# Patient Record
Sex: Female | Born: 1950 | Race: White | Hispanic: No | Marital: Married | Smoking: Never smoker
Health system: Southern US, Community
[De-identification: ages and names within clinical notes are randomized; demographics above are authoritative.]

## PROBLEM LIST (undated history)

## (undated) DIAGNOSIS — N189 Chronic kidney disease, unspecified: Secondary | ICD-10-CM

## (undated) DIAGNOSIS — I10 Essential (primary) hypertension: Secondary | ICD-10-CM

## (undated) DIAGNOSIS — F419 Anxiety disorder, unspecified: Secondary | ICD-10-CM

## (undated) DIAGNOSIS — M25519 Pain in unspecified shoulder: Secondary | ICD-10-CM

## (undated) HISTORY — PX: DILATION AND CURETTAGE, DIAGNOSTIC / THERAPEUTIC: SUR384

## (undated) HISTORY — PX: COLONOSCOPY: SHX174

---

## 2001-12-15 ENCOUNTER — Emergency Department (HOSPITAL_COMMUNITY): Admission: EM | Admit: 2001-12-15 | Discharge: 2001-12-15 | Payer: Self-pay

## 2003-10-26 ENCOUNTER — Other Ambulatory Visit: Payer: Self-pay

## 2004-12-24 ENCOUNTER — Ambulatory Visit: Payer: Self-pay | Admitting: Family Medicine

## 2005-01-01 ENCOUNTER — Ambulatory Visit: Payer: Self-pay | Admitting: Family Medicine

## 2017-01-04 ENCOUNTER — Ambulatory Visit: Payer: Self-pay

## 2017-02-01 ENCOUNTER — Ambulatory Visit: Payer: Self-pay

## 2017-03-24 ENCOUNTER — Encounter (INDEPENDENT_AMBULATORY_CARE_PROVIDER_SITE_OTHER): Payer: Self-pay

## 2017-03-24 ENCOUNTER — Ambulatory Visit: Payer: BLUE CROSS/BLUE SHIELD | Attending: Oncology

## 2017-03-24 ENCOUNTER — Ambulatory Visit
Admission: RE | Admit: 2017-03-24 | Discharge: 2017-03-24 | Disposition: A | Discharge status: Home / Self Care | Payer: BLUE CROSS/BLUE SHIELD | Source: Ambulatory Visit | Attending: Oncology | Admitting: Oncology

## 2017-03-24 VITALS — BP 130/77 | HR 76 | Temp 98.6°F | Resp 18 | Ht 61.81 in | Wt 106.4 lb

## 2017-03-24 DIAGNOSIS — Z0000 Encounter for general adult medical examination without abnormal findings: Secondary | ICD-10-CM

## 2017-03-24 NOTE — Progress Notes (Signed)
Subjective:     Patient ID: Danielle Stephens, female   DOB: 03-19-1950, 67 y.o.   MRN: [id number]  HPI   Review of Systems     Objective:   Physical Exam  Pulmonary/Chest: Right breast exhibits no inverted nipple, no mass, no nipple discharge, no skin change and no tenderness. Left breast exhibits no inverted nipple, no mass, no nipple discharge and no skin change. Breasts are asymmetrical.  Left breast smaller than right        Assessment:    67 year old patient presents for [facility] clinic visit.  Patient screened, and meets BCCCP eligibility.  Patient does not have insurance, Medicare or Medicaid.  Handout given on Affordable Care Act.  Instructed patient on breast self-exam using teach back method.  CBE unremarkable.  No mass or lump palpated.  Patient states her last mammogram is 14 years ago. 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    Plan:     Sent for bilateral screening mammogram.

## 2017-03-25 NOTE — Progress Notes (Signed)
Letter mailed from [facility] to notify of normal mammogram results.  Patient to return in one year for annual screening.  Copy to [facility]. 

## 2017-04-17 ENCOUNTER — Encounter: Payer: Self-pay | Admitting: Gynecology

## 2017-04-17 ENCOUNTER — Ambulatory Visit
Admission: EM | Admit: 2017-04-17 | Discharge: 2017-04-17 | Disposition: A | Discharge status: Home / Self Care | Payer: BLUE CROSS/BLUE SHIELD | Attending: Family Medicine | Admitting: Family Medicine

## 2017-04-17 ENCOUNTER — Other Ambulatory Visit: Payer: Self-pay

## 2017-04-17 DIAGNOSIS — H10021 Other mucopurulent conjunctivitis, right eye: Secondary | ICD-10-CM

## 2017-04-17 DIAGNOSIS — H1031 Unspecified acute conjunctivitis, right eye: Secondary | ICD-10-CM

## 2017-04-17 HISTORY — DX: Essential (primary) hypertension: I10

## 2017-04-17 MED ORDER — POLYMYXIN B-TRIMETHOPRIM 10000-0.1 UNIT/ML-% OP SOLN: 1.0000 [drp] | Freq: Four times a day (QID) | OPHTHALMIC | 0 refills | Status: AC

## 2017-04-17 NOTE — ED Provider Notes (Signed)
[facility]  CSN: [id number] Arrival date & time: 04/17/17  0175  History   Chief Complaint Chief Complaint  Patient presents with  . Conjunctivitis   HPI  67 year old female presents with conjunctivitis.  Patient works at a daycare.  Started yesterday.  She has had right eye redness and draining.  Crusted shut this morning.  No photophobia.  No visual disturbance.  No reports of foreign body.  No known exacerbating or relieving factors.  No other associated symptoms.  No other complaints at this time.  Past Medical History:  Diagnosis Date  . Hypertension    Surgical Hx - No past surgeries.  OB History    No data available     Home Medications    Prior to Admission medications   Medication Sig Start Date End Date Taking? Authorizing Provider  lisinopril (PRINIVIL,ZESTRIL) 20 MG tablet Take 20 mg by mouth daily.   Yes [provider]  trimethoprim-polymyxin b (POLYTRIM) ophthalmic solution Place 1 drop into the right eye every 6 (six) hours for 5 days. 04/17/17 04/22/17  Coral Spikes, DO   Family History Family History  Problem Relation Age of Onset  . Breast cancer Neg Hx     Social History Social History   Tobacco Use  . Smoking status: Never Smoker  . Smokeless tobacco: Never Used  Substance Use Topics  . Alcohol use: No    Frequency: Never  . Drug use: No    Allergies   Codeine   Review of Systems Review of Systems  Constitutional: Negative.   Eyes: Positive for discharge and redness.   Physical Exam Triage Vital Signs ED Triage Vitals  Enc Vitals Group     BP 04/17/17 1019 (!) 151/66     Pulse Rate 04/17/17 1019 94     Resp 04/17/17 1019 16     Temp 04/17/17 1019 98.8 F (37.1 C)     Temp Source 04/17/17 1019 Oral     SpO2 04/17/17 1019 100 %     Weight 04/17/17 1025 106 lb (48.1 kg)     Height 04/17/17 1025 5\' 2"  (1.575 m)     Head Circumference --      Peak Flow --      Pain Score 04/17/17 1022 0     Pain Loc --    Pain Edu? --      Excl. in [place]? --    Updated Vital Signs BP (!) 151/66 (BP Location: Left Arm)   Pulse 94   Temp 98.8 F (37.1 C) (Oral)   Resp 16   Ht 5\' 2"  (1.575 m)   Wt 106 lb (48.1 kg)   SpO2 100%   BMI 19.39 kg/m   Visual Acuity Right Eye Distance: 20/30 Left Eye Distance: 20/40 Bilateral Distance: 20/30(without corrective lens)  Right Eye Near:   Left Eye Near:    Bilateral Near:     Physical Exam  Constitutional: She is oriented to person, place, and time. She appears well-developed and well-nourished. No distress.  Eyes: Pupils are equal, round, and reactive to light.  Right conjunctival injection.  No drainage or crusting noted.  Cardiovascular: Normal rate and regular rhythm.  Pulmonary/Chest: Effort normal. She has no wheezes. She has no rales.  Neurological: She is alert and oriented to person, place, and time.  Psychiatric: She has a normal mood and affect. Her behavior is normal.  Nursing note and vitals reviewed.  UC Treatments / Results  Labs (all labs ordered  are listed, but only abnormal results are displayed) Labs Reviewed - No data to display  EKG  EKG Interpretation None       Radiology No results found.  Procedures Procedures (including critical care time)  Medications Ordered in UC Medications - No data to display   Initial Impression / Assessment and Plan / UC Course  I have reviewed the triage vital signs and the nursing notes.  Pertinent labs & imaging results that were available during my care of the patient were reviewed by me and considered in my medical decision making (see chart for details).     67 year old female presents with conjunctivitis.  Treating with Polytrim.  Final Clinical Impressions(s) / UC Diagnoses   Final diagnoses:  Acute bacterial conjunctivitis of right eye    ED Discharge Orders        Ordered    trimethoprim-polymyxin b (POLYTRIM) ophthalmic solution  Every 6 hours     04/17/17 1039      Controlled Substance Prescriptions [place] Controlled Substance Registry consulted? Not Applicable   Coral Spikes, DO 04/17/17 1050

## 2017-04-17 NOTE — ED Triage Notes (Signed)
Patient c/o right eye redness/ drainage x yesterday.

## 2017-04-20 ENCOUNTER — Telehealth: Payer: Self-pay | Admitting: Emergency Medicine

## 2017-04-20 NOTE — Telephone Encounter (Signed)
Called to follow up after patient's recent visit. Spoke with family member and he states patient is doing better. He just dropped her off at [employer] and she is on her way to [place].

## 2019-01-20 ENCOUNTER — Ambulatory Visit
Admission: EM | Admit: 2019-01-20 | Discharge: 2019-01-20 | Disposition: A | Discharge status: Home / Self Care | Payer: Medicare HMO | Attending: Family Medicine | Admitting: Family Medicine

## 2019-01-20 ENCOUNTER — Other Ambulatory Visit: Payer: Self-pay

## 2019-01-20 ENCOUNTER — Ambulatory Visit (INDEPENDENT_AMBULATORY_CARE_PROVIDER_SITE_OTHER): Payer: Medicare HMO

## 2019-01-20 DIAGNOSIS — M25511 Pain in right shoulder: Secondary | ICD-10-CM

## 2019-01-20 MED ORDER — MELOXICAM 7.5 MG PO TABS: 7.5000 mg | ORAL_TABLET | Freq: Every day | ORAL | 0 refills | Status: DC

## 2019-01-20 MED ORDER — TRAMADOL HCL 50 MG PO TABS: 50.0000 mg | ORAL_TABLET | Freq: Two times a day (BID) | ORAL | 0 refills | Status: DC | PRN

## 2019-01-20 NOTE — Discharge Instructions (Addendum)
Take medication as prescribed. Rest. Drink plenty of fluids. Exercises as discussed. Ice.   Follow-up with orthopedic in 1 week as needed for continued pain.  Follow up with your primary care physician this week as needed. Return to Urgent care for new or worsening concerns.

## 2019-01-20 NOTE — ED Triage Notes (Signed)
Patient complains of right shoulder pain that occurred 2 weeks ago after trying to catch a board that she was lifting. Patient states that pain has remained constant.

## 2019-01-20 NOTE — ED Provider Notes (Addendum)
[facility] ____________________________________________  Time seen: Approximately 12:07 PM  I have reviewed the triage vital signs and the nursing notes.   HISTORY  Chief Complaint Shoulder Pain  HPI Danielle Stephens is a 68 y.o. female presenting for evaluation of right shoulder pain.  Right-hand-dominant.  Patient reports 2 weeks ago she and her husband were carrying a piece of drywall, in which she accidentally dropped it but then caught it again.  Reports when she did this she immediately felt a pull and pain to her right shoulder.  Reports right shoulder pain has continued.  Denies pain radiation, paresthesias, chest pain or shortness of breath.  Denies skin changes or rash.  Reports pain is mostly with active range of motion or when laying on the area at night.  Has been taken ibuprofen and Tylenol intermittently with some improvement but no resolution. Also applying ice.   Gennette Pac, FNP: PCP   Past Medical History:  Diagnosis Date  . Hypertension     There are no active problems to display for this patient.   Past Surgical History:  Procedure Laterality Date  . NO PAST SURGERIES       No current facility-administered medications for this encounter.   Current Outpatient Medications:  .  lisinopril (PRINIVIL,ZESTRIL) 20 MG tablet, Take 20 mg by mouth daily., Disp: , Rfl:  .  meloxicam (MOBIC) 7.5 MG tablet, Take 1 tablet (7.5 mg total) by mouth daily., Disp: 14 tablet, Rfl: 0 .  traMADol (ULTRAM) 50 MG tablet, Take 1 tablet (50 mg total) by mouth every 12 (twelve) hours as needed for severe pain ((0.5-1tab as needed))., Disp: 10 tablet, Rfl: 0  Allergies Codeine  Family History  Problem Relation Age of Onset  . Hypertension Mother   . Cancer Mother   . Hypertension Father   . Heart attack Father   . Breast cancer Neg Hx     Social History Social History   Tobacco Use  . Smoking status: Never Smoker  . Smokeless tobacco: Never Used   Substance Use Topics  . Alcohol use: No    Frequency: Never  . Drug use: No    Review of Systems Constitutional: No fever. Cardiovascular: Denies chest pain. Respiratory: Denies shortness of breath. Gastrointestinal: No abdominal pain.   Musculoskeletal: Positive right shoulder pain. Skin: Negative for rash. Neurological: Negative for focal weakness or numbness.   ____________________________________________   PHYSICAL EXAM:  VITAL SIGNS: ED Triage Vitals  Enc Vitals Group     BP 01/20/19 1047 (!) 173/67     Pulse Rate 01/20/19 1047 71     Resp 01/20/19 1047 16     Temp 01/20/19 1047 98.2 F (36.8 C)     Temp Source 01/20/19 1047 Oral     SpO2 01/20/19 1047 100 %     Weight 01/20/19 1045 106 lb (48.1 kg)     Height 01/20/19 1045 5\' 2"  (1.575 m)     Head Circumference --      Peak Flow --      Pain Score 01/20/19 1045 8     Pain Loc --      Pain Edu? --      Excl. in [place]? --    Vitals:   01/20/19 1045 01/20/19 1047 01/20/19 1139  BP:  (!) 173/67 (!) 150/80  Pulse:  71   Resp:  16   Temp:  98.2 F (36.8 C)   TempSrc:  Oral   SpO2:  100%  Weight: 106 lb (48.1 kg)    Height: 5\' 2"  (1.575 m)     Constitutional: Alert and oriented. Well appearing and in no acute distress. Eyes: Conjunctivae are normal.  ENT      Head: Normocephalic and atraumatic. Cardiovascular: Normal rate, regular rhythm. Grossly normal heart sounds.  Good peripheral circulation. Respiratory: Normal respiratory effort without tachypnea nor retractions. Breath sounds are clear and equal bilaterally. No wheezes, rales, rhonchi. Musculoskeletal:No midline cervical, thoracic or lumbar tenderness to palpation. Bilateral distal radial pulses equal and easily palpated. Except:  Right anterior dorsal shoulder mild tenderness to direct palpation mild tenderness along supraspinatus, no point bony tenderness, able to abduct to approximately 90 degrees but with increasing pain past 90 degrees, negative  drop arm test, positive empty can test, pain with internal and external rotation, right upper extremity otherwise nontender. Neurologic:  Normal speech and language. No gross focal neurologic deficits are appreciated. Speech is normal. No gait instability.  Skin:  Skin is warm, dry and intact. No rash noted. Psychiatric: Mood and affect are normal. Speech and behavior are normal. Patient exhibits appropriate insight and judgment   ___________________________________________   LABS (all labs ordered are listed, but only abnormal results are displayed)  Labs Reviewed - No data to display ____________________________________________  RADIOLOGY  Dg Shoulder Right  Result Date: 01/20/2019 CLINICAL DATA:  Right shoulder injury with pain radiating to the right arm, initial encounter. EXAM: RIGHT SHOULDER - 2+ VIEW COMPARISON:  None. FINDINGS: No acute osseous or joint abnormality. Degenerative changes in the right acromioclavicular joint. Visualized portion of the right chest is grossly unremarkable. IMPRESSION: 1. No acute findings. 2. Mild right acromioclavicular joint osteoarthritis. Electronically Signed   By: Lorin Picket M.D.   On: 01/20/2019 11:41   ____________________________________________   PROCEDURES Procedures    INITIAL IMPRESSION / ASSESSMENT AND PLAN / ED COURSE  Pertinent labs & imaging results that were available during my care of the patient were reviewed by me and considered in my medical decision making (see chart for details).  Well-appearing patient.  No acute distress.  Right shoulder pain post injury.  Right shoulder x-ray as above, no acute findings, mild right AC joint osteoarthritis.  Suspect strain versus partial rotator cuff tear.  Will start patient on oral Mobic and as needed tramadol as needed for breakthrough pain.  Recommend ice, pendulum exercises and follow-up with orthopedic in 1 week for continued pain is possible MRI and physical therapy may be  needed.Discussed indication, risks and benefits of medications with patient.   Discussed follow up with Primary care physician this week as needed. Discussed follow up and return parameters including no resolution or any worsening concerns. Patient verbalized understanding and agreed to plan.   [place] controlled substance database reviewed, no recent controlled substances documented.  ____________________________________________   FINAL CLINICAL IMPRESSION(S) / ED DIAGNOSES  Final diagnoses:  Acute pain of right shoulder     ED Discharge Orders         Ordered    meloxicam (MOBIC) 7.5 MG tablet  Daily     01/20/19 1156    traMADol (ULTRAM) 50 MG tablet  Every 12 hours PRN     01/20/19 1156           Note: This dictation was prepared with Dragon dictation along with smaller phrase technology. Any transcriptional errors that result from this process are unintentional.      Marylene Land, NP 01/20/19 1256

## 2019-02-02 ENCOUNTER — Other Ambulatory Visit: Payer: Self-pay | Admitting: Orthopedic Surgery

## 2019-02-02 DIAGNOSIS — M25311 Other instability, right shoulder: Secondary | ICD-10-CM

## 2019-02-02 DIAGNOSIS — S46001A Unspecified injury of muscle(s) and tendon(s) of the rotator cuff of right shoulder, initial encounter: Secondary | ICD-10-CM

## 2019-02-17 ENCOUNTER — Ambulatory Visit
Admission: RE | Admit: 2019-02-17 | Discharge: 2019-02-17 | Disposition: A | Discharge status: Home / Self Care | Payer: Medicare HMO | Source: Ambulatory Visit | Attending: Orthopedic Surgery | Admitting: Orthopedic Surgery

## 2019-02-17 ENCOUNTER — Other Ambulatory Visit: Payer: Self-pay

## 2019-02-17 DIAGNOSIS — S46001A Unspecified injury of muscle(s) and tendon(s) of the rotator cuff of right shoulder, initial encounter: Secondary | ICD-10-CM | POA: Insufficient documentation

## 2019-02-17 DIAGNOSIS — M25311 Other instability, right shoulder: Secondary | ICD-10-CM | POA: Diagnosis present

## 2019-03-01 ENCOUNTER — Other Ambulatory Visit: Payer: Self-pay

## 2019-03-01 ENCOUNTER — Other Ambulatory Visit: Payer: Self-pay | Admitting: Orthopedic Surgery

## 2019-03-01 ENCOUNTER — Encounter
Admission: RE | Admit: 2019-03-01 | Discharge: 2019-03-01 | Disposition: A | Discharge status: Home / Self Care | Payer: Medicare HMO | Source: Ambulatory Visit | Attending: Orthopedic Surgery | Admitting: Orthopedic Surgery

## 2019-03-01 DIAGNOSIS — M75101 Unspecified rotator cuff tear or rupture of right shoulder, not specified as traumatic: Secondary | ICD-10-CM | POA: Insufficient documentation

## 2019-03-01 DIAGNOSIS — Z20828 Contact with and (suspected) exposure to other viral communicable diseases: Secondary | ICD-10-CM | POA: Insufficient documentation

## 2019-03-01 DIAGNOSIS — Z01818 Encounter for other preprocedural examination: Secondary | ICD-10-CM | POA: Insufficient documentation

## 2019-03-01 DIAGNOSIS — I10 Essential (primary) hypertension: Secondary | ICD-10-CM | POA: Insufficient documentation

## 2019-03-01 HISTORY — DX: Anxiety disorder, unspecified: F41.9

## 2019-03-01 HISTORY — DX: Pain in unspecified shoulder: M25.519

## 2019-03-01 NOTE — Pre-Procedure Instructions (Signed)
No surgical orders called office.

## 2019-03-01 NOTE — Patient Instructions (Addendum)
Your procedure is scheduled on: 03/06/2019 Mon Report to Same Day Surgery 2nd floor medical mall [facility] Entrance-take elevator on left to 2nd floor.  Check in with surgery information desk.) To find out your arrival time please call [phone] between 1PM - 3PM on 03/03/2019 Fri  Remember: Instructions that are not followed completely may result in serious medical risk, up to and including death, or upon the discretion of your surgeon and anesthesiologist your surgery may need to be rescheduled.    _x___ 1. Do not eat food after midnight the night before your procedure. You may drink clear liquids up to 2 hours before you are scheduled to arrive at the hospital for your procedure.  Do not drink clear liquids within 2 hours of your scheduled arrival to the hospital.  Clear liquids include  --Water or Apple juice without pulp  --Clear carbohydrate beverage such as ClearFast or Gatorade  --Black Coffee or Clear Tea (No milk, no creamers, do not add anything to                  the coffee or Tea Type 1 and type 2 diabetics should only drink water.   ____Ensure clear carbohydrate drink on the way to the hospital for bariatric patients  __x__Ensure clear carbohydrate drink 3 hours before surgery. Complete drink 1.5 hours prior to surgery.   No gum chewing or hard candies.     __x__ 2. No Alcohol for 24 hours before or after surgery.   __x__3. No Smoking or e-cigarettes for 24 prior to surgery.  Do not use any chewable tobacco products for at least 6 hour prior to surgery   ____  4. Bring all medications with you on the day of surgery if instructed.    __x__ 5. Notify your doctor if there is any change in your medical condition     (cold, fever, infections).    x___6. On the morning of surgery brush your teeth with toothpaste and water.  You may rinse your mouth with mouth wash if you wish.  Do not swallow any toothpaste or mouthwash.   Do not wear jewelry, make-up, hairpins,  clips or nail polish.  Do not wear lotions, powders, or perfumes. You may wear deodorant.  Do not shave 48 hours prior to surgery. Men may shave face and neck.  Do not bring valuables to the hospital.    [facility] is not responsible for any belongings or valuables.               Contacts, dentures or bridgework may not be worn into surgery.  Leave your suitcase in the car. After surgery it may be brought to your room.  For patients admitted to the hospital, discharge time is determined by your                       treatment team.  _  Patients discharged the day of surgery will not be allowed to drive home.  You will need someone to drive you home and stay with you the night of your procedure.    Please read over the following fact sheets that you were given:   [facility] Preparing for Surgery and or MRSA Information   _x___ Take anti-hypertensive listed below, cardiac, seizure, asthma,     anti-reflux and psychiatric medicines. These include:  1. None  2.  3.  4.  5.  6.  ____Fleets enema or Magnesium Citrate as directed.  _x___ Use CHG Soap or sage wipes as directed on instruction sheet   ____ Use inhalers on the day of surgery and bring to hospital day of surgery  ____ Stop Metformin and Janumet 2 days prior to surgery.    ____ Take 1/2 of usual insulin dose the night before surgery and none on the morning     surgery.   _x___ Follow recommendations from Cardiologist, Pulmonologist or PCP regarding          stopping Aspirin, Coumadin, Plavix ,Eliquis, Effient, or Pradaxa, and Pletal.  X____Stop Anti-inflammatories such as Advil, Aleve, Ibuprofen, Motrin, Naproxen, Naprosyn, Goodies powders or aspirin products. OK to take Tylenol and                          Celebrex.   _x___ Stop supplements until after surgery.  But may continue Vitamin D, Vitamin B,       and multivitamin.   ____ Bring C-Pap to the hospital.

## 2019-03-02 ENCOUNTER — Encounter
Admission: RE | Admit: 2019-03-02 | Discharge: 2019-03-02 | Disposition: A | Discharge status: Home / Self Care | Payer: Medicare HMO | Source: Ambulatory Visit | Attending: Orthopedic Surgery | Admitting: Orthopedic Surgery

## 2019-03-02 ENCOUNTER — Other Ambulatory Visit
Admission: RE | Admit: 2019-03-02 | Discharge: 2019-03-02 | Disposition: A | Discharge status: Home / Self Care | Payer: Medicare HMO | Source: Ambulatory Visit | Attending: Orthopedic Surgery | Admitting: Orthopedic Surgery

## 2019-03-02 ENCOUNTER — Other Ambulatory Visit: Payer: Self-pay

## 2019-03-02 DIAGNOSIS — Z20828 Contact with and (suspected) exposure to other viral communicable diseases: Secondary | ICD-10-CM | POA: Diagnosis not present

## 2019-03-02 DIAGNOSIS — Z01818 Encounter for other preprocedural examination: Secondary | ICD-10-CM | POA: Diagnosis not present

## 2019-03-02 DIAGNOSIS — M75101 Unspecified rotator cuff tear or rupture of right shoulder, not specified as traumatic: Secondary | ICD-10-CM | POA: Diagnosis not present

## 2019-03-02 DIAGNOSIS — I10 Essential (primary) hypertension: Secondary | ICD-10-CM | POA: Diagnosis not present

## 2019-03-02 LAB — BASIC METABOLIC PANEL
Anion gap: 9 (ref 5–15)
BUN: 17 mg/dL (ref 8–23)
CO2: 28 mmol/L (ref 22–32)
Calcium: 9.6 mg/dL (ref 8.9–10.3)
Chloride: 103 mmol/L (ref 98–111)
Creatinine, Ser: 0.91 mg/dL (ref 0.44–1.00)
GFR calc Af Amer: >60 mL/min (ref >60)
GFR calc non Af Amer: >60 mL/min (ref >60)
Glucose, Bld: 74 mg/dL (ref 70–99)
Potassium: 4.4 mmol/L (ref 3.5–5.1)
Sodium: 140 mmol/L (ref 135–145)

## 2019-03-02 LAB — CBC
HCT: 39.7 % (ref 36.0–46.0)
Hemoglobin: 12.7 g/dL (ref 12.0–15.0)
MCH: 26.6 pg (ref 26.0–34.0)
MCHC: 32 g/dL (ref 30.0–36.0)
MCV: 83.2 fL (ref 80.0–100.0)
Platelets: 229 10*3/uL (ref 150–400)
RBC: 4.77 MIL/uL (ref 3.87–5.11)
RDW: 12.9 % (ref 11.5–15.5)
WBC: 4.7 10*3/uL (ref 4.0–10.5)
nRBC: 0 % (ref 0.0–0.2)

## 2019-03-02 LAB — SARS CORONAVIRUS 2 (TAT 6-24 HRS): SARS Coronavirus 2: NEGATIVE

## 2019-03-05 MED ORDER — CEFAZOLIN SODIUM-DEXTROSE 2-4 GM/100ML-% IV SOLN
2.0000 g | INTRAVENOUS | Status: AC
  Administered 2019-03-06: 2 g via INTRAVENOUS

## 2019-03-06 ENCOUNTER — Ambulatory Visit
Admission: RE | Admit: 2019-03-06 | Discharge: 2019-03-06 | Disposition: A | Discharge status: Home / Self Care | Payer: Medicare HMO | Attending: Orthopedic Surgery | Admitting: Orthopedic Surgery

## 2019-03-06 ENCOUNTER — Ambulatory Visit: Payer: Medicare HMO | Admitting: Certified Registered"

## 2019-03-06 ENCOUNTER — Ambulatory Visit: Payer: Medicare HMO

## 2019-03-06 ENCOUNTER — Encounter
Admission: RE | Disposition: A | Discharge status: Home / Self Care | Payer: Self-pay | Source: Home / Self Care | Attending: Orthopedic Surgery

## 2019-03-06 ENCOUNTER — Other Ambulatory Visit: Payer: Self-pay

## 2019-03-06 ENCOUNTER — Encounter: Payer: Self-pay | Admitting: Orthopedic Surgery

## 2019-03-06 DIAGNOSIS — F419 Anxiety disorder, unspecified: Secondary | ICD-10-CM | POA: Diagnosis not present

## 2019-03-06 DIAGNOSIS — M7541 Impingement syndrome of right shoulder: Secondary | ICD-10-CM | POA: Diagnosis not present

## 2019-03-06 DIAGNOSIS — M75101 Unspecified rotator cuff tear or rupture of right shoulder, not specified as traumatic: Secondary | ICD-10-CM | POA: Insufficient documentation

## 2019-03-06 DIAGNOSIS — M19011 Primary osteoarthritis, right shoulder: Secondary | ICD-10-CM | POA: Diagnosis not present

## 2019-03-06 DIAGNOSIS — I10 Essential (primary) hypertension: Secondary | ICD-10-CM | POA: Insufficient documentation

## 2019-03-06 DIAGNOSIS — M7581 Other shoulder lesions, right shoulder: Secondary | ICD-10-CM | POA: Diagnosis not present

## 2019-03-06 DIAGNOSIS — Z419 Encounter for procedure for purposes other than remedying health state, unspecified: Secondary | ICD-10-CM

## 2019-03-06 HISTORY — PX: OTHER SURGICAL HISTORY: SHX169

## 2019-03-06 SURGERY — SHOULDER ARTHROSCOPY WITH SUBACROMIAL DECOMPRESSION AND DISTAL CLAVICLE EXCISION
Anesthesia: General | Site: Shoulder | Laterality: Right

## 2019-03-06 MED ORDER — PHENYLEPHRINE HCL (PRESSORS) 10 MG/ML IV SOLN
INTRAVENOUS | Status: DC | PRN
  Administered 2019-03-06 (×5): 100 ug via INTRAVENOUS

## 2019-03-06 MED ORDER — SODIUM CHLORIDE 0.9 % IV SOLN: INTRAVENOUS | Status: DC | PRN

## 2019-03-06 MED ORDER — OXYCODONE HCL 5 MG PO TABS: 5.0000 mg | ORAL_TABLET | ORAL | 0 refills | Status: DC | PRN

## 2019-03-06 MED ORDER — CEFAZOLIN SODIUM-DEXTROSE 2-4 GM/100ML-% IV SOLN
INTRAVENOUS | Status: AC
  Filled 2019-03-06: qty 100

## 2019-03-06 MED ORDER — LIDOCAINE HCL (PF) 2 % IJ SOLN
INTRAMUSCULAR | Status: AC
  Filled 2019-03-06: qty 10

## 2019-03-06 MED ORDER — ASPIRIN EC 325 MG PO TBEC: 325.0000 mg | DELAYED_RELEASE_TABLET | Freq: Every day | ORAL | 0 refills | Status: AC

## 2019-03-06 MED ORDER — NEOMYCIN-POLYMYXIN B GU 40-200000 IR SOLN
Status: AC
  Filled 2019-03-06: qty 20

## 2019-03-06 MED ORDER — FENTANYL CITRATE (PF) 100 MCG/2ML IJ SOLN
INTRAMUSCULAR | Status: DC | PRN
  Administered 2019-03-06: 25 ug via INTRAVENOUS
  Administered 2019-03-06: 50 ug via INTRAVENOUS
  Administered 2019-03-06: 25 ug via INTRAVENOUS

## 2019-03-06 MED ORDER — EPHEDRINE SULFATE 50 MG/ML IJ SOLN
INTRAMUSCULAR | Status: AC
  Filled 2019-03-06: qty 1

## 2019-03-06 MED ORDER — ONDANSETRON 4 MG PO TBDP: 4.0000 mg | ORAL_TABLET | Freq: Three times a day (TID) | ORAL | 0 refills | Status: DC | PRN

## 2019-03-06 MED ORDER — FAMOTIDINE 20 MG PO TABS: 20.0000 mg | ORAL_TABLET | Freq: Once | ORAL | Status: AC

## 2019-03-06 MED ORDER — EPINEPHRINE PF 1 MG/ML IJ SOLN
INTRAMUSCULAR | Status: AC
  Filled 2019-03-06: qty 5

## 2019-03-06 MED ORDER — BUPIVACAINE HCL (PF) 0.5 % IJ SOLN
INTRAMUSCULAR | Status: AC
  Filled 2019-03-06: qty 30

## 2019-03-06 MED ORDER — PROMETHAZINE HCL 25 MG/ML IJ SOLN: 6.2500 mg | INTRAMUSCULAR | Status: DC | PRN

## 2019-03-06 MED ORDER — SODIUM CHLORIDE 0.9 % IV SOLN
INTRAVENOUS | Status: DC | PRN
  Administered 2019-03-06: 30 ug/min via INTRAVENOUS

## 2019-03-06 MED ORDER — ACETAMINOPHEN 500 MG PO TABS: 1000.0000 mg | ORAL_TABLET | Freq: Three times a day (TID) | ORAL | 2 refills | Status: DC

## 2019-03-06 MED ORDER — FENTANYL CITRATE (PF) 100 MCG/2ML IJ SOLN
INTRAMUSCULAR | Status: AC
  Filled 2019-03-06: qty 2

## 2019-03-06 MED ORDER — ONDANSETRON HCL 4 MG/2ML IJ SOLN
INTRAMUSCULAR | Status: AC
  Filled 2019-03-06: qty 2

## 2019-03-06 MED ORDER — PROPOFOL 10 MG/ML IV BOLUS
INTRAVENOUS | Status: DC | PRN
  Administered 2019-03-06: 80 mg via INTRAVENOUS

## 2019-03-06 MED ORDER — LACTATED RINGERS IV SOLN: INTRAVENOUS | Status: DC | PRN

## 2019-03-06 MED ORDER — MIDAZOLAM HCL 2 MG/2ML IJ SOLN
INTRAMUSCULAR | Status: DC | PRN
  Administered 2019-03-06 (×2): 1 mg via INTRAVENOUS

## 2019-03-06 MED ORDER — DEXAMETHASONE SODIUM PHOSPHATE 10 MG/ML IJ SOLN
INTRAMUSCULAR | Status: DC | PRN
  Administered 2019-03-06: 4 mg via INTRAVENOUS

## 2019-03-06 MED ORDER — ACETAMINOPHEN 10 MG/ML IV SOLN
INTRAVENOUS | Status: DC | PRN
  Administered 2019-03-06: 1000 mg via INTRAVENOUS

## 2019-03-06 MED ORDER — ROCURONIUM BROMIDE 100 MG/10ML IV SOLN
INTRAVENOUS | Status: DC | PRN
  Administered 2019-03-06: 50 mg via INTRAVENOUS

## 2019-03-06 MED ORDER — ROCURONIUM BROMIDE 50 MG/5ML IV SOLN
INTRAVENOUS | Status: AC
  Filled 2019-03-06: qty 1

## 2019-03-06 MED ORDER — DEXAMETHASONE SODIUM PHOSPHATE 10 MG/ML IJ SOLN
INTRAMUSCULAR | Status: AC
  Filled 2019-03-06: qty 1

## 2019-03-06 MED ORDER — PROPOFOL 10 MG/ML IV BOLUS
INTRAVENOUS | Status: AC
  Filled 2019-03-06: qty 20

## 2019-03-06 MED ORDER — FENTANYL CITRATE (PF) 100 MCG/2ML IJ SOLN
25.0000 ug | INTRAMUSCULAR | Status: DC | PRN
  Administered 2019-03-06 (×3): 25 ug via INTRAVENOUS

## 2019-03-06 MED ORDER — SUGAMMADEX SODIUM 200 MG/2ML IV SOLN
INTRAVENOUS | Status: AC
  Filled 2019-03-06: qty 2

## 2019-03-06 MED ORDER — MIDAZOLAM HCL 2 MG/2ML IJ SOLN
INTRAMUSCULAR | Status: AC
  Filled 2019-03-06: qty 2

## 2019-03-06 MED ORDER — SODIUM CHLORIDE (PF) 0.9 % IJ SOLN
INTRAMUSCULAR | Status: AC
  Filled 2019-03-06: qty 10

## 2019-03-06 MED ORDER — FAMOTIDINE 20 MG PO TABS
ORAL_TABLET | ORAL | Status: AC
  Administered 2019-03-06: 06:00:00 20 mg via ORAL
  Filled 2019-03-06: qty 1

## 2019-03-06 MED ORDER — CHLORHEXIDINE GLUCONATE 4 % EX LIQD: 60.0000 mL | Freq: Once | CUTANEOUS | Status: DC

## 2019-03-06 MED ORDER — ONDANSETRON HCL 4 MG/2ML IJ SOLN
INTRAMUSCULAR | Status: DC | PRN
  Administered 2019-03-06: 4 mg via INTRAVENOUS

## 2019-03-06 MED ORDER — LIDOCAINE HCL (PF) 1 % IJ SOLN
INTRAMUSCULAR | Status: AC
  Filled 2019-03-06: qty 5

## 2019-03-06 MED ORDER — SUGAMMADEX SODIUM 500 MG/5ML IV SOLN
INTRAVENOUS | Status: AC
  Filled 2019-03-06: qty 5

## 2019-03-06 MED ORDER — BUPIVACAINE LIPOSOME 1.3 % IJ SUSP
INTRAMUSCULAR | Status: AC
  Filled 2019-03-06: qty 20

## 2019-03-06 MED ORDER — BUPIVACAINE HCL (PF) 0.5 % IJ SOLN
INTRAMUSCULAR | Status: AC
  Filled 2019-03-06: qty 10

## 2019-03-06 MED ORDER — LIDOCAINE HCL (CARDIAC) PF 100 MG/5ML IV SOSY
PREFILLED_SYRINGE | INTRAVENOUS | Status: DC | PRN
  Administered 2019-03-06: 60 mg via INTRAVENOUS

## 2019-03-06 MED ORDER — FENTANYL CITRATE (PF) 100 MCG/2ML IJ SOLN
INTRAMUSCULAR | Status: AC
  Administered 2019-03-06: 25 ug via INTRAVENOUS
  Filled 2019-03-06: qty 2

## 2019-03-06 MED ORDER — NEOMYCIN-POLYMYXIN B GU 40-200000 IR SOLN
Status: DC | PRN
  Administered 2019-03-06: 2 mL

## 2019-03-06 MED ORDER — PHENYLEPHRINE HCL (PRESSORS) 10 MG/ML IV SOLN
INTRAVENOUS | Status: AC
  Filled 2019-03-06: qty 1

## 2019-03-06 MED ORDER — LIDOCAINE HCL 4 % MT SOLN
OROMUCOSAL | Status: DC | PRN
  Administered 2019-03-06: 4 mL via TOPICAL

## 2019-03-06 MED ORDER — SUGAMMADEX SODIUM 200 MG/2ML IV SOLN
INTRAVENOUS | Status: DC | PRN
  Administered 2019-03-06: 100 mg via INTRAVENOUS

## 2019-03-06 MED ORDER — LACTATED RINGERS IV SOLN: INTRAVENOUS | Status: DC

## 2019-03-06 SURGICAL SUPPLY — 94 items
ADAPTER IRRIG TUBE 2 SPIKE SOL (ADAPTER) ×6 IMPLANT
ADPR TBG 2 SPK PMP STRL ASCP (ADAPTER) ×2
ANCH SUT 2X2.3 TAPE (Anchor) ×2 IMPLANT
ANCH SUT 4.75 1 ARM KNTLS (Anchor) ×1 IMPLANT
ANCH SUT 6.5 KNTLS STRL LF (Anchor) ×3 IMPLANT
ANCHOR 2.3 SP SGL 1.2 XBRAID (Anchor) ×2 IMPLANT
ANCHOR 2.3MM SP SGL 1.2 XBRAID (Anchor) ×2 IMPLANT
ANCHOR SUT 6.5 PEEK EYELET (Anchor) ×6 IMPLANT
APL PRP STRL LF DISP 70% ISPRP (MISCELLANEOUS) ×1
BRUSH SCRUB EZ  4% CHG (MISCELLANEOUS) ×2
BRUSH SCRUB EZ 4% CHG (MISCELLANEOUS) ×1 IMPLANT
BUR BR 5.5 12 FLUTE (BURR) IMPLANT
BUR RADIUS 4.0X18.5 (BURR) ×3 IMPLANT
BUR RADIUS 5.5 (BURR) ×1 IMPLANT
CANNULA 5.75X7CM (CANNULA) ×2
CANNULA PART THRD DISP 5.75X7 (CANNULA) ×4 IMPLANT
CANNULA PARTIAL THREAD 2X7 (CANNULA) ×3 IMPLANT
CANNULA TWIST IN 8.25X9CM (CANNULA) IMPLANT
CHLORAPREP W/TINT 26 (MISCELLANEOUS) ×3 IMPLANT
COOLER ICEMAN CLASSIC (MISCELLANEOUS) ×1 IMPLANT
COOLER POLAR GLACIER W/PUMP (MISCELLANEOUS) ×2 IMPLANT
COVER WAND RF STERILE (DRAPES) ×3 IMPLANT
CRADLE LAMINECT ARM (MISCELLANEOUS) ×3 IMPLANT
DEVICE SUCT BLK HOLE OR FLOOR (MISCELLANEOUS) ×2 IMPLANT
DRAPE 3/4 80X56 (DRAPES) ×3 IMPLANT
DRAPE IMP U-DRAPE 54X76 (DRAPES) ×4 IMPLANT
DRAPE INCISE IOBAN 66X45 STRL (DRAPES) ×3 IMPLANT
DRAPE SPLIT 6X30 W/TAPE (DRAPES) ×6 IMPLANT
DRAPE STERI 35X30 U-POUCH (DRAPES) ×1 IMPLANT
DRAPE U-SHAPE 47X51 STRL (DRAPES) ×4 IMPLANT
DRSG TEGADERM 4X4.75 (GAUZE/BANDAGES/DRESSINGS) ×8 IMPLANT
ELECT REM PT RETURN 9FT ADLT (ELECTROSURGICAL)
ELECTRODE REM PT RTRN 9FT ADLT (ELECTROSURGICAL) IMPLANT
FIBER TAPE 2MM (SUTURE) ×2 IMPLANT
GAUZE SPONGE 4X4 12PLY STRL (GAUZE/BANDAGES/DRESSINGS) ×3 IMPLANT
GAUZE XEROFORM 1X8 LF (GAUZE/BANDAGES/DRESSINGS) ×3 IMPLANT
GLOVE BIO SURGEON STRL SZ7.5 (GLOVE) ×3 IMPLANT
GLOVE BIOGEL PI IND STRL 8 (GLOVE) ×2 IMPLANT
GLOVE BIOGEL PI INDICATOR 8 (GLOVE) ×4
GLOVE SURG ORTHO 8.0 STRL STRW (GLOVE) ×2 IMPLANT
GLOVE SURG SYN 7.5  E (GLOVE) ×4
GLOVE SURG SYN 7.5 E (GLOVE) ×2 IMPLANT
GLOVE SURG SYN 7.5 PF PI (GLOVE) ×1 IMPLANT
GOWN STRL REUS W/ TWL LRG LVL3 (GOWN DISPOSABLE) ×2 IMPLANT
GOWN STRL REUS W/ TWL XL LVL3 (GOWN DISPOSABLE) IMPLANT
GOWN STRL REUS W/TWL LRG LVL3 (GOWN DISPOSABLE) ×15
GOWN STRL REUS W/TWL LRG LVL4 (GOWN DISPOSABLE) ×1 IMPLANT
GOWN STRL REUS W/TWL XL LVL3 (GOWN DISPOSABLE) ×3
IV LACTATED RINGER IRRG 3000ML (IV SOLUTION) ×21
IV LR IRRIG 3000ML ARTHROMATIC (IV SOLUTION) ×4 IMPLANT
KIT STABILIZATION SHOULDER (MISCELLANEOUS) ×3 IMPLANT
KIT SUTURETAK 3.0 INSERT PERC (KITS) ×3 IMPLANT
KIT TURNOVER KIT A (KITS) ×3 IMPLANT
MANIFOLD NEPTUNE II (INSTRUMENTS) ×3 IMPLANT
MASK FACE SPIDER DISP (MASK) ×3 IMPLANT
MAT ABSORB  FLUID 56X50 GRAY (MISCELLANEOUS) ×4
MAT ABSORB FLUID 56X50 GRAY (MISCELLANEOUS) ×2 IMPLANT
NDL SAFETY ECLIPSE 18X1.5 (NEEDLE) ×1 IMPLANT
NDL SCORPION MULTI FIRE (NEEDLE) IMPLANT
NEEDLE HYPO 18GX1.5 SHARP (NEEDLE) ×3
NEEDLE HYPO 22GX1.5 SAFETY (NEEDLE) ×3 IMPLANT
NEEDLE SCORPION MULTI FIRE (NEEDLE) ×3 IMPLANT
NS IRRIG 500ML POUR BTL (IV SOLUTION) ×3 IMPLANT
PACK ARTHROSCOPY SHOULDER (MISCELLANEOUS) ×3 IMPLANT
PAD ABD DERMACEA PRESS 5X9 (GAUZE/BANDAGES/DRESSINGS) ×3 IMPLANT
PAD WRAPON POLAR SHDR UNIV (MISCELLANEOUS) IMPLANT
PAD WRAPON POLAR SHDR XLG (MISCELLANEOUS) ×1 IMPLANT
PENCIL SMOKE ULTRAEVAC 22 CON (MISCELLANEOUS) ×3 IMPLANT
SET TUBE SUCT SHAVER OUTFL 24K (TUBING) ×3 IMPLANT
SET TUBE TIP INTRA-ARTICULAR (MISCELLANEOUS) ×3 IMPLANT
SLING ULTRA II M (MISCELLANEOUS) ×1 IMPLANT
STRAP SAFETY 5IN WIDE (MISCELLANEOUS) ×3 IMPLANT
SUT ETHILON 3-0 FS-10 30 BLK (SUTURE) ×3
SUT LASSO 90 DEG SD STR (SUTURE) ×3 IMPLANT
SUT MNCRL 4-0 (SUTURE) ×3
SUT MNCRL 4-0 27XMFL (SUTURE) ×1
SUT PDS AB 0 CT1 27 (SUTURE) ×3 IMPLANT
SUT VIC AB 0 CT1 36 (SUTURE) ×2 IMPLANT
SUT VIC AB 2-0 CT2 27 (SUTURE) ×2 IMPLANT
SUTURE EHLN 3-0 FS-10 30 BLK (SUTURE) ×1 IMPLANT
SUTURE MNCRL 4-0 27XMF (SUTURE) ×1 IMPLANT
SUTURE TAPE 1.3 40 TPR END (SUTURE) IMPLANT
SUTURETAPE 1.3 40 TPR END (SUTURE) ×3
SYR 10ML LL (SYRINGE) ×3 IMPLANT
SYSTEM ANCHOR SUT KNTLESS 4.75 (Anchor) ×2 IMPLANT
SYSTEM FBRTK BICEPS 1.9 DRILL (Anchor) ×2 IMPLANT
TAPE CLOTH 3X10 WHT NS LF (GAUZE/BANDAGES/DRESSINGS) ×3 IMPLANT
TUBING ARTHRO INFLOW-ONLY STRL (TUBING) ×3 IMPLANT
TUBING CONNECTING 10 (TUBING) ×2 IMPLANT
TUBING CONNECTING 10' (TUBING) ×1
WAND WEREWOLF FLOW 90D (MISCELLANEOUS) ×2 IMPLANT
WRAPON POLAR PAD SHDR UNIV (MISCELLANEOUS) ×3
WRAPON POLAR PAD SHDR XLG (MISCELLANEOUS)
arthrex fiber tape 2mm ×1 IMPLANT

## 2019-03-06 NOTE — H&P (Signed)
Paper H&P to be scanned into permanent record. H&P reviewed. No significant changes noted.  

## 2019-03-06 NOTE — Discharge Instructions (Signed)
Interscalene Nerve Block with Exparel  1.  For your surgery you have received an Interscalene Nerve Block with Exparel. 2. Nerve Blocks affect many types of nerves, including nerves that control movement, pain and normal sensation.  You may experience feelings such as numbness, tingling, heaviness, weakness or the inability to move your arm or the feeling or sensation that your arm has "fallen asleep". 3. A nerve block with Exparel can last up to 5 days.  Usually the weakness wears off first.  The tingling and heaviness usually wear off next.  Finally you may start to notice pain.  Keep in mind that this may occur in any order.  Once a nerve block starts to wear off it is usually completely gone within 60 minutes. 4. ISNB may cause mild shortness of breath, a hoarse voice, blurry vision, unequal pupils, or drooping of the face on the same side as the nerve block.  These symptoms will usually resolve with the numbness.  Very rarely the procedure itself can cause mild seizures. 5. If needed, your surgeon will give you a prescription for pain medication.  It will take about 60 minutes for the oral pain medication to become fully effective.  So, it is recommended that you start taking this medication before the nerve block first begins to wear off, or when you first begin to feel discomfort. 6. Take your pain medication only as prescribed.  Pain medication can cause sedation and decrease your breathing if you take more than you need for the level of pain that you have. 7. Nausea is a common side effect of many pain medications.  You may want to eat something before taking your pain medicine to prevent nausea. 8. After an Interscalene nerve block, you cannot feel pain, pressure or extremes in temperature in the effected arm.  Because your arm is numb it is at an increased risk for injury.  To decrease the possibility of injury, please practice the following:  a. While you are awake change the  position of your arm frequently to prevent too much pressure on any one area for prolonged periods of time. b.  If you have a cast or tight dressing, check the color or your fingers every couple of hours.  Call your surgeon with the appearance of any discoloration (white or blue). c. If you are given a sling to wear before you go home, please wear it  at all times until the block has completely worn off.  Do not get up at night without your sling. d. Please contact [facility] or your surgeon if you do not begin to regain sensation after 7 days from the surgery.  Anesthesia may be contacted by calling the Same Day Surgery Department, Mon. through Fri., 6 am to 4 pm at [phone].   e. If you experience any other problems or concerns, please contact your surgeon's office. If you experience severe or prolonged shortness of breath go to the nearest emergency department.Bupivacaine Liposomal Suspension for Injection What is this medicine? BUPIVACAINE LIPOSOMAL (bue PIV a kane LIP oh som al) is an anesthetic. It causes loss of feeling in the skin or other tissues. It is used to prevent and to treat pain from some procedures. This medicine may be used for other purposes; ask your health care provider or pharmacist if you have questions. COMMON BRAND NAME(S): EXPAREL What should I tell my health care provider before I take this medicine? They need to know  if you have any of these conditions:  heart disease  kidney disease  liver disease  an unusual or allergic reaction to bupivacaine, other medicines, foods, dyes, or preservatives  pregnant or trying to get pregnant  breast-feeding How should I use this medicine? This medicine is for injection into the affected area. It is given by a health care professional in a hospital or clinic setting. Talk to your pediatrician regarding the use of this medicine in children. Special care may be needed. Overdosage: If you think you have taken too much  of this medicine contact a poison control center or emergency room at once. NOTE: This medicine is only for you. Do not share this medicine with others. What if I miss a dose? This does not apply. What may interact with this medicine? This medicine may interact with the following medications:  acetaminophen  certain antibiotics like dapsone, nitrofurantoin, aminosalicylic acid, sulfasalazine  certain medicines for seizures like phenobarbital, phenytoin, valproic acid  chloroquine  cyclophosphamide  flutamide  hydroxyurea  ifosfamide  metoclopramide  nitroglycerin  other local anesthetics like lidocaine, pramoxine, tetracaine  primaquine  quinine This list may not describe all possible interactions. Give your health care provider a list of all the medicines, herbs, non-prescription drugs, or dietary supplements you use. Also tell them if you smoke, drink alcohol, or use illegal drugs. Some items may interact with your medicine. What should I watch for while using this medicine? Your condition will be monitored carefully while you are receiving this medicine. Be careful to avoid injury while the area is numb and you are not aware of pain. If you have a procedure in the next 4 days, tell your provider you had this medicine. You should not get a second injection in the same area. What side effects may I notice from receiving this medicine? Side effects that you should report to your doctor or health care professional as soon as possible:  allergic reactions like skin rash, itching or hives, swelling of the face, lips, or tongue  breathing problems  changes in vision  dizziness  fast or slow, irregular heartbeat  joint pain, stiffness, or loss of motion  seizures Side effects that usually do not require medical attention (report to your doctor or health care professional if they continue or are bothersome):  constipation  irritation at site where  injected  nausea, vomiting  tiredness This list may not describe all possible side effects. Call your doctor for medical advice about side effects. You may report side effects to FDA at 1-800-FDA-1088. Where should I keep my medicine? This drug is given in a hospital or clinic and will not be stored at home. NOTE: This sheet is a summary. It may not cover all possible information. If you have questions about this medicine, talk to your doctor, pharmacist, or health care provider.  2020 Elsevier/Gold Standard (2017-12-13 13:54:45) Post-Op Instructions - Rotator Cuff Repair  1. Bracing: You will wear a shoulder immobilizer or sling for 6 weeks.   2. Driving: No driving for 3 weeks post-op. When driving, do not wear the immobilizer. Ideally, we recommend no driving for 6 weeks while sling is in place as one arm will be immobilized.   3. Activity: No active lifting for 2 months. Wrist, hand, and elbow motion only. Avoid lifting the upper arm away from the body except for hygiene. You are permitted to bend and straighten the elbow passively only (no active elbow motion). You may use your hand and wrist  for typing, writing, and managing utensils (cutting food). Do not lift more than a coffee cup for 8 weeks.  When sleeping or resting, inclined positions (recliner chair or wedge pillow) and a pillow under the forearm for support may provide better comfort for up to 4 weeks.  Avoid long distance travel for 4 weeks.  Return to normal activities after rotator cuff repair repair normally takes 6 months on average. If rehab goes very well, may be able to do most activities at 4 months, except overhead or contact sports.  4. Physical Therapy: Begins 3-4 days after surgery, and proceed 1 time per week for the first 6 weeks, then 1-2 times per week from weeks 6-20 post-op.  5. Medications:  - You will be provided a prescription for narcotic pain medicine. After surgery, take 1-2 narcotic tablets every 4  hours if needed for severe pain.  - A prescription for anti-nausea medication will be provided in case the narcotic medicine causes nausea - take 1 tablet every 6 hours only if nauseated.   - Take tylenol 1000 mg (2 Extra Strength tablets or 3 regular strength) every 8 hours for pain.  May decrease or stop tylenol 5 days after surgery if you are having minimal pain. - Take ASA 325mg /day x 2 weeks to help prevent DVTs/PEs (blood clots).  - DO NOT take ANY nonsteroidal anti-inflammatory pain medications (Advil, Motrin, Ibuprofen, Aleve, Naproxen, or Naprosyn). These medicines can inhibit healing of your shoulder repair.    If you are taking prescription medication for anxiety, depression, insomnia, muscle spasm, chronic pain, or for attention deficit disorder, you are advised that you are at a higher risk of adverse effects with use of narcotics post-op, including narcotic addiction/dependence, depressed breathing, death. If you use non-prescribed substances: alcohol, marijuana, cocaine, heroin, methamphetamines, etc., you are at a higher risk of adverse effects with use of narcotics post-op, including narcotic addiction/dependence, depressed breathing, death. You are advised that taking > 50 morphine milligram equivalents (MME) of narcotic pain medication per day results in twice the risk of overdose or death. For your prescription provided: oxycodone 5 mg - taking more than 6 tablets per day would result in > 50 morphine milligram equivalents (MME) of narcotic pain medication. Be advised that we will prescribe narcotics short-term, for acute post-operative pain only - 3 weeks for major operations such as shoulder repair/reconstruction surgeries.     6. Post-Op Appointment:  Your first post-op appointment will be 10-14 days post-op.  7. Work or School: For most, but not all procedures, we advise staying out of work or school for at least 1 to 2 weeks in order to recover from the stress of surgery  and to allow time for healing.   If you need a work or school note this can be provided.   8. Smoking: If you are a smoker, you need to refrain from smoking in the postoperative period. The nicotine in cigarettes will inhibit healing of your shoulder repair and decrease the chance of successful repair. Similarly, nicotine containing products (gum, patches) should be avoided.   Post-operative Brace: Apply and remove the brace you received as you were instructed to at the time of fitting and as described in detail as the braces instructions for use indicate.  Wear the brace for the period of time prescribed by your physician.  The brace can be cleaned with soap and water and allowed to air dry only.  Should the brace result in increased pain, decreased feeling (numbness/tingling),  increased swelling or an overall worsening of your medical condition, please contact your doctor immediately.  If an emergency situation occurs as a result of wearing the brace after normal business hours, please dial 911 and seek immediate medical attention.  Let your doctor know if you have any further questions about the brace issued to you. Refer to the shoulder sling instructions for use if you have any questions regarding the correct fit of your shoulder sling.  [facility]: [phone]  Video that illustrates how to properly use a shoulder sling: "Instructions for Proper Use of an Orthopaedic Sling" [email or web address]   AMBULATORY SURGERY  DISCHARGE INSTRUCTIONS   1) The drugs that you were given will stay in your system until tomorrow so for the next 24 hours you should not:  A) Drive an automobile B) Make any legal decisions C) Drink any alcoholic beverage   2) You may resume regular meals tomorrow.  Today it is better to start with liquids and gradually work up to solid foods.  You may eat anything you prefer, but it is better to start with liquids,  then soup and crackers, and gradually work up to solid foods.   3) Please notify your doctor immediately if you have any unusual bleeding, trouble breathing, redness and pain at the surgery site, drainage, fever, or pain not relieved by medication.    4) Additional Instructions:        Please contact your physician with any problems or Same Day Surgery at [phone], Monday through Friday 6 am to 4 pm, or [place] [facility] number at [phone].

## 2019-03-06 NOTE — Transfer of Care (Signed)
Immediate Anesthesia Transfer of Care Note  Patient: Danielle Stephens  Procedure(s) Performed: SHOULDER ARTHROSCOPY WITH MINI OPEN ROTATOR CUFF REPAIR, BICEPS TENODESIS, SUBACROMIAL DECOMPRESSION AND DISTAL CLAVICLE EXCISION (Right Shoulder)  Patient Location: PACU  Anesthesia Type:General  Level of Consciousness: awake, alert  and oriented  Airway & Oxygen Therapy: Patient Spontanous Breathing and Patient connected to nasal cannula oxygen  Post-op Assessment: Report given to RN and Post -op Vital signs reviewed and stable  Post vital signs: Reviewed and stable  Last Vitals:  Vitals Value Taken Time  BP 149/76 03/06/19 1036  Temp 36.4 C 03/06/19 1035  Pulse 78 03/06/19 1041  Resp 20 03/06/19 1041  SpO2 100 % 03/06/19 1041  Vitals shown include unvalidated device data.  Last Pain:  Vitals:   03/06/19 1035  TempSrc:   PainSc: 6          Complications: No apparent anesthesia complications

## 2019-03-06 NOTE — Anesthesia Postprocedure Evaluation (Signed)
Anesthesia Post Note  Patient: [name]  Procedure(s) Performed: SHOULDER ARTHROSCOPY WITH MINI OPEN ROTATOR CUFF REPAIR, BICEPS TENODESIS, SUBACROMIAL DECOMPRESSION AND DISTAL CLAVICLE EXCISION (Right Shoulder)  Patient location during evaluation: PACU Anesthesia Type: General Level of consciousness: awake and alert Pain management: pain level controlled Vital Signs Assessment: post-procedure vital signs reviewed and stable Respiratory status: spontaneous breathing, nonlabored ventilation, respiratory function stable and patient connected to nasal cannula oxygen Cardiovascular status: blood pressure returned to baseline and stable Postop Assessment: no apparent nausea or vomiting Anesthetic complications: no     Last Vitals:  Vitals:   03/06/19 1225 03/06/19 1238  BP: (!) 141/71 (!) 162/74  Pulse:  94  Resp:  16  Temp:  37.4 C  SpO2:  98%    Last Pain:  Vitals:   03/06/19 1238  TempSrc: Temporal  PainSc: 0-No pain                 Martha Clan

## 2019-03-06 NOTE — Anesthesia Preprocedure Evaluation (Signed)
Anesthesia Evaluation  Patient identified by MRN, date of birth, ID band Patient awake    Reviewed: Allergy & Precautions, H&P , NPO status , Patient's Chart, lab work & pertinent test results, reviewed documented beta blocker date and time   History of Anesthesia Complications Negative for: history of anesthetic complications  Airway Mallampati: II  TM Distance: >3 FB Neck ROM: full    Dental  (+) Dental Advidsory Given, Teeth Intact, Partial Lower, Missing   Pulmonary neg pulmonary ROS,    Pulmonary exam normal        Cardiovascular Exercise Tolerance: Good hypertension, (-) angina(-) Past MI and (-) Cardiac Stents Normal cardiovascular exam(-) dysrhythmias (-) Valvular Problems/Murmurs     Neuro/Psych negative neurological ROS  negative psych ROS   GI/Hepatic negative GI ROS, Neg liver ROS,   Endo/Other  negative endocrine ROS  Renal/GU negative Renal ROS  negative genitourinary   Musculoskeletal   Abdominal   Peds  Hematology negative hematology ROS (+)   Anesthesia Other Findings Past Medical History: No date: Anxiety No date: Hypertension No date: Shoulder pain   Reproductive/Obstetrics negative OB ROS                             Anesthesia Physical Anesthesia Plan  ASA: II  Anesthesia Plan: General   Post-op Pain Management:  Regional for Post-op pain   Induction: Intravenous  PONV Risk Score and Plan: 3 and Ondansetron, Dexamethasone, Midazolam and Treatment may vary due to age or medical condition  Airway Management Planned: Oral ETT  Additional Equipment:   Intra-op Plan:   Post-operative Plan: Extubation in OR  Informed Consent: I have reviewed the patients History and Physical, chart, labs and discussed the procedure including the risks, benefits and alternatives for the proposed anesthesia with the patient or authorized representative who has indicated his/her  understanding and acceptance.     Dental Advisory Given  Plan Discussed with: Anesthesiologist, CRNA and Surgeon  Anesthesia Plan Comments:         Anesthesia Quick Evaluation

## 2019-03-06 NOTE — Op Note (Signed)
SURGERY DATE: 03/06/2019  PRE-OP DIAGNOSIS:  1. Right subacromial impingement 2. Right biceps tendinopathy 3. Right rotator cuff tear 4. Right acromioclavicular joint osteoarthritis  POST-OP DIAGNOSIS: 1. Right subacromial impingement 2. Right biceps tendinopathy 3. Right rotator cuff tear 4. Right acromioclavicular joint osteoarthritis  PROCEDURES:  1. Right mini-open rotator cuff repair 2. Right open biceps tenodesis 3. Right arthroscopic subacromial decompression 4. Right arthroscopic extensive debridement of shoulder (glenohumeral and subacromial spaces) 5. Right arthroscopic distal clavicle excision  SURGEON: Cato Mulligan, MD  ASSISTANT:  Anitra Lauth, PA  ANESTHESIA: Gen with Exparil interscalene block  ESTIMATED BLOOD LOSS: 25cc  DRAINS:  none  TOTAL IV FLUIDS: per anesthesia   SPECIMENS: none  IMPLANTS:  - Arthrex 1.64mm FiberTak - Stryker 4.72mm Omega Knotless Anchor System  x 4   OPERATIVE FINDINGS:  Examination under anesthesia: A careful examination under anesthesia was performed.  Passive range of motion was: FF: 160; ER at side: 45; ER in abduction: 90; IR in abduction: 60.  Anterior load shift: NT.  Posterior load shift: NT.  Sulcus in neutral: NT.  Sulcus in ER: NT.    Intra-operative findings: A thorough arthroscopic examination of the shoulder was performed.  The findings are: 1. Biceps tendon: tendinopathy with significant erythema 2. Superior labrum: injected with surrounding synovitis 3. Posterior labrum and capsule: normal 4. Inferior capsule and inferior recess: normal 5. Glenoid cartilage surface: Grade 1-2 changes posteriorly 6. Supraspinatus attachment: Bursal sided tear affecting approximately 90% of the footprint with thin layer of intact fibers at the articular surface attachment 7. Posterior rotator cuff attachment: normal 8. Humeral head articular cartilage: normal 9. Rotator interval: significant synovitis 10: Subscapularis  tendon: attachment intact 11. Anterior labrum: degenerative and erythematous 12. IGHL: normal  OPERATIVE REPORT:   Indications for procedure: Danielle Stephens is a 68 y.o. female with ~3 months of R shoulder pain that began after she attempted to pick up a particle board.  She has had pain in her shoulder since that time with difficulty with overhead motion.  She has failed non-operative management including activity modification and medical management. Clinical exam and MRI were suggestive of essentially full-thickness rotator cuff tear, biceps tendinopathy, subacromial impingement, and acromioclavicular joint arthritis.  Given the nature of this tear, surgical intervention was recommended.  After discussion of risks, benefits, and alternatives to surgery, the patient elected to proceed.   Procedure in detail: I identified Danielle Stephens in the pre-operative holding area.  I marked the operative shoulder with my initials. I reviewed the risks and benefits of the proposed surgical intervention, and the patient wished to proceed.  The patient was transferred to the operative suite and anesthesia was administered prior to placing the patient in the beach chair position.  SCDs were placed on the lower extremities. Appropriate IV antibiotics were administered prior to incision. The operative upper extremity was then prepped and draped in standard fashion. A time out was performed confirming the correct extremity, correct patient, and correct procedure.   I then created a standard posterior portal with an 11 blade. The glenohumeral joint was easily entered with a blunt trochar and the arthroscope introduced. The findings of diagnostic arthroscopy are described above. I debrided degenerative tissue including the synovitic tissue about the rotator interval, the glenoid cartilage, and anterior and superior labrum. I then coagulated the inflamed synovium to obtain hemostasis and reduce the risk of post-operative swelling  using an Arthrocare radiofrequency device. I performed a biceps tenotomy using an arthroscopic  scissors and used a motorized shaver to debride the stump back to a stable base.   Next, the arthroscope was then introduced into the subacromial space. A direct lateral portal was created with an 11-blade after spinal needle localization. An extensive subacromial bursectomy was performed using a combination of the shaver and Arthrocare wand. The entire acromial undersurface was exposed and the CA ligament was subperiosteally elevated to expose the anterior acromial hook. A 5.48mm barrel burr was used to create a flat anterior and lateral aspect of the acromion, converting it from a Type 3 to a Type 1 acromion. Care was made to keep the deltoid fascia intact.  I then turned my attention to the arthroscopic distal clavicle excision. I identified the acromioclavicular joint. Surrounding bursal tissue was debrided and the edges of the joint were identified. I used the 5.2mm barrel burr to remove the distal clavicle parallel to the edge of the acromion. I was able to fit two widths of the burr into the space between the distal clavicle and acromion, signifying that I had removed ~30mm of distal clavicle. This was confirmed by viewing anteriorly and introducing a probe with measuring marks from the lateral portal. Hemostasis was achieved with an Arthrocare wand. Fluid was evacuated from the shoulder.   A longitudinal incision from the anterolateral acromion ~7cm in length was made overlying the raphe between the anterior and middle heads of the deltoid. The raphe was identified and it was incised. The subacromial space was identified. Any remaining bursa was excised. The rotator cuff tear was identified. It was an almost complete, full-thickness tear of the supraspinatus with a very thin layer of tendon attachment at the articular margin.  This was an L-shaped tear with the long limb anterior.  We then turned our  attention to the biceps tenodesis. The arm was externally rotated.  The bicipital groove was identified.  A 15 blade was used to make a cut overlying the biceps tendon, and the tendon was removed using a right angle clamp.  The base of the bicipital groove was identified and cleared of soft tissue.  A FiberTak anchor was placed in the bicipital groove.  The biceps tendon was held at the appropriate amount of tension.  One set of sutures was passed through the biceps anchor with one limb passed in a simple fashion and the second limb passed in a simple plus locking stitch pattern.  This was repeated for the other set of sutures.  This construct allowed for shuttling the biceps tendon down to the bone.  The sutures were tied and cut.  The diseased portion of the proximal biceps was then excised.  The arm was then internally rotated.  The tear was completed with a 15 blade given the thin remnant fibers remaining. The rotator cuff footprint was cleared of soft tissue. A rongeur was used to gently decorticate the rotator cuff footprint to allow for improved healing.   I attempted to place Two Iconix SPEED anchors just lateral to the articular margin, but due to soft bone, these anchors did not achieve appropriate fixation.  Therefore, 2 Stryker Omega anchors loaded with 2.0 mm and 1.2 mm tapes were placed for medial row anchors, one anteriorly, and one posteriorly. The rotator cuff was able to be reduced to its footprint and then held in a reduced position with graspers. All 8 strands of suture from the medial row anchors were passed through the rotator cuff in an appropriate fashion.  Two Omega anchors were placed  for the lateral row anchors with one limb of each of the medial row sutures passed through each anchor.  Additionally, 2 tapes from the anterior anchor were passed through a small anterior dog ear and tied. This allowed for reapproximation of the rotator cuff over its footprint. This construct was stable  with external and internal rotation.  The wound was thoroughly irrigated.  The deltoid split was closed with 0 Vicryl.  The subdermal layer was closed with 2-0 Vicryl.  The skin was closed with 4-0 Monocryl and Dermabond. The portals were closed with 3-0 Nylon. Xeroform was applied to the incisions. A sterile dressing was applied, followed by a Polar Care sleeve and a SlingShot shoulder immobilizer/sling. The patient was awakened from anesthesia without difficulty and was transferred to the PACU in stable condition.   Of note, assistance from a PA was essential to performing the surgery.  PA was present for the entire surgery.  PA assisted with patient positioning, retraction, instrumentation, and wound closure. The surgery would have been more difficult and had longer operative time without PA assistance.     COMPLICATIONS: none  DISPOSITION: plan for discharge home after recovery in PACU   POSTOPERATIVE PLAN: Remain in sling (except hygiene and elbow/wrist/hand RoM exercises as instructed by PT) x 6 weeks and NWB for this time. PT to begin 3-4 days after surgery. Rotator cuff repair and biceps tenodesis rehab protocol. ASA 325mg  daily x 2 weeks for DVT ppx.

## 2019-03-06 NOTE — Anesthesia Post-op Follow-up Note (Signed)
Anesthesia QCDR form completed.        

## 2019-03-06 NOTE — Anesthesia Procedure Notes (Signed)
Procedure Name: Intubation Date/Time: 03/06/2019 7:53 AM Performed by: Esaw Grandchild, CRNA Pre-anesthesia Checklist: Patient identified, Emergency Drugs available, Suction available and Patient being monitored Patient Re-evaluated:Patient Re-evaluated prior to induction Oxygen Delivery Method: Circle system utilized Preoxygenation: Pre-oxygenation with 100% oxygen Induction Type: IV induction Ventilation: Mask ventilation without difficulty Laryngoscope Size: Miller and 2 Grade View: Grade I Tube type: Oral Tube size: 7.0 mm Number of attempts: 1 Airway Equipment and Method: Stylet,  Oral airway and LTA kit utilized Placement Confirmation: ETT inserted through vocal cords under direct vision,  positive ETCO2 and breath sounds checked- equal and bilateral Secured at: 20 cm Tube secured with: Tape Dental Injury: Teeth and Oropharynx as per pre-operative assessment

## 2019-03-07 NOTE — Progress Notes (Signed)
No answering machine

## 2019-03-19 ENCOUNTER — Encounter: Payer: Self-pay | Admitting: Nurse Practitioner

## 2019-03-19 DIAGNOSIS — I10 Essential (primary) hypertension: Secondary | ICD-10-CM | POA: Insufficient documentation

## 2019-03-23 ENCOUNTER — Ambulatory Visit: Payer: Self-pay | Admitting: Nurse Practitioner

## 2019-03-27 ENCOUNTER — Ambulatory Visit: Payer: Self-pay | Admitting: Nurse Practitioner

## 2019-04-10 ENCOUNTER — Ambulatory Visit (INDEPENDENT_AMBULATORY_CARE_PROVIDER_SITE_OTHER): Payer: Medicare HMO | Admitting: Nurse Practitioner

## 2019-04-10 ENCOUNTER — Encounter: Payer: Self-pay | Admitting: Nurse Practitioner

## 2019-04-10 ENCOUNTER — Other Ambulatory Visit: Payer: Self-pay

## 2019-04-10 DIAGNOSIS — Z9889 Other specified postprocedural states: Secondary | ICD-10-CM | POA: Insufficient documentation

## 2019-04-10 DIAGNOSIS — I10 Essential (primary) hypertension: Secondary | ICD-10-CM

## 2019-04-10 MED ORDER — METHOCARBAMOL 500 MG PO TABS: 500.0000 mg | ORAL_TABLET | Freq: Four times a day (QID) | ORAL | 0 refills | Status: DC | PRN

## 2019-04-10 NOTE — Assessment & Plan Note (Signed)
Chronic, stable.  Recent kidney function normal on hospital labs in December.  BP at goal today.  Continue current medication regimen and adjust as needed.  Will send refills upon request.  Plan on labs next visit, if able perform physical.  Will see how arm is healing.

## 2019-04-10 NOTE — Patient Instructions (Signed)
Rotator Cuff Tear  A rotator cuff tear is a partial or complete tear of the cord-like bands (tendons) that connect muscle to bone in the rotator cuff. The rotator cuff is a group of muscles and tendons that surround the shoulder joint and keep the upper arm bone (humerus) in the shoulder socket. The tear can occur suddenly (acute tear) or can develop over a long period of time (chronic tear). What are the causes? Acute tears may be caused by:  A fall, especially on an outstretched arm.  Lifting very heavy objects with a jerking motion. Chronic tears may be caused by overuse of the muscles. This may happen in sports, physical work, or activities in which your arm repeatedly moves over your head. What increases the risk? This condition is more likely to occur in:  Athletes and workers who frequently use their shoulder or reach over their heads. This may include activities such as: ? Tennis. ? Baseball and softball. ? Swimming and rowing. ? Weightlifting. ? Construction work. ? Painting.  People who smoke.  Older people who have arthritis or poor blood supply. These can make the muscles and tendons weaker. What are the signs or symptoms? Symptoms of this condition depend on the type and severity of the injury:  An acute tear may include a sudden tearing feeling, followed by severe pain that goes from your upper shoulder, down your arm, and toward your elbow.  A chronic tear includes a gradual weakness and decreased shoulder motion as the pain gets worse. The pain is usually worse at night. Both types may have symptoms such as:  Pain that spreads (radiates) from the shoulder to the upper arm.  Swelling and tenderness in front of the shoulder.  Decreased range of motion.  Pain when: ? Reaching, pulling, or lifting the arm above the head. ? Lowering the arm from above the head.  Not being able to raise your arm out to the side.  Difficulty placing the arm behind your back. How  is this diagnosed? This condition is diagnosed with a medical history and physical exam. Imaging tests may also be done, including:  X-rays.  MRI.  Ultrasound.  CT or MR arthrogram. During this test, a contrast material is injected into your shoulder and then images are taken. How is this treated? Treatment for this condition depends on the type and severity of the condition. In less severe cases, treatment may include:  Rest. This may be done with a sling that holds the shoulder still (immobilization). Your health care provider may also recommend avoiding activities that involve lifting your arm over your head.  Icing the shoulder.  Anti-inflammatory medicines, such as aspirin or ibuprofen.  Strengthening and stretching exercises. Your health care provider may recommend specific exercises to improve your range of motion and strengthen your shoulder. In more severe cases, treatment may include:  Physical therapy.  Steroid injections.  Surgery. Follow these instructions at home: Managing pain, stiffness, and swelling  If directed, put ice on the injured area. ? If you have a removable sling, remove it as told by your health care provider. ? Put ice in a plastic bag. ? Place a towel between your skin and the bag. ? Leave the ice on for 20 minutes, 2-3 times a day.  Raise (elevate) the injured area above the level of your heart while you are lying down.  Find a comfortable sleeping position or sleep on a recliner, if available.  Move your fingers often to avoid stiffness   and to lessen swelling. °· Once the swelling has gone down, your health care provider may direct you to apply heat to relax the muscles. Use the heat source that your health care provider recommends, such as a moist heat pack or a heating pad. °? Place a towel between your skin and the heat source. °? Leave the heat on for 20-30 minutes. °? Remove the heat if your skin turns bright red. This is especially  important if you are unable to feel pain, heat, or cold. You may have a greater risk of getting burned. °If you have a sling: °· Wear the sling as told by your health care provider. Remove it only as told by your health care provider. °· Loosen the sling if your fingers tingle, become numb, or turn cold and blue. °· Keep the sling clean. °· If the sling is not waterproof: °? Do not let it get wet. °? Cover it with a watertight covering when you take a bath or a shower. °Driving °· Do not drive or use heavy machinery while taking prescription pain medicine. °· Ask your health care provider when it is safe to drive if you have a sling on your arm. °Activity °· Rest your shoulder as told by your health care provider. °· Return to your normal activities as told by your health care provider. Ask your health care provider what activities are safe for you. °· Do any exercises or stretches as told by your health care provider. °General instructions °· Do not use any products that contain nicotine or tobacco, such as cigarettes and e-cigarettes. If you need help quitting, ask your health care provider. °· Take over-the-counter and prescription medicines only as told by your health care provider. °· Keep all follow-up visits as told by your health care provider. This is important. °Contact a health care provider if: °· Your pain gets worse. °· You have new pain in your arm, hands, or fingers. °· Medicine does not help your pain. °Get help right away if: °· Your arm, hand, or fingers are numb or tingling. °· Your arm, hand, or fingers are swollen or painful or they turn white or blue. °· Your hand or fingers on your injured arm are colder than your other hand. °Summary °· A rotator cuff tear is a partial or complete tear of the cord-like bands (tendons) that connect muscle to bone in the rotator cuff. °· The tear can occur suddenly (acute tear) or can develop over a long period of time (chronic tear). °· Treatment generally  includes rest, anti-inflammatory medicines, and icing. In some cases, physical therapy and steroid injections may be needed. In severe cases, surgery may be needed. °This information is not intended to replace advice given to you by your health care provider. Make sure you discuss any questions you have with your health care provider. °Document Revised: 02/12/2017 Document Reviewed: 05/18/2016 °Elsevier Patient Education © 2020 [employer] ° °

## 2019-04-10 NOTE — Assessment & Plan Note (Signed)
Performed in December 2020 by Dr. Posey Pronto.  She continue to have discomfort with PT, but unable to take Oxycodone.  Will send in script for Robaxin which may help during PT sessions to relax muscle and perform therapy.  Return in 4 weeks for visit or sooner if poor response to medication.

## 2019-04-10 NOTE — Progress Notes (Signed)
New Patient Office Visit  Subjective:  Patient ID: Danielle Stephens, female    DOB: 1951-02-11  Age: 69 y.o. MRN: [id number]  CC:  Chief Complaint  Patient presents with  . Establish Care    HPI Danielle Stephens presents for new patient visit to establish care.  Introduced to Designer, jewellery role and practice setting.  All questions answered.  Goes [facility] and it is far away, so looking for provider closer.  Is familiar with NP role as was being followed by an FNP.  Has a history of anxiety many years ago while traveling on a mission trip, but no issues since that time.  HYPERTENSION Currently on Lisinopril 20 MG daily. Hypertension status: stable  Satisfied with current treatment? yes Duration of hypertension: chronic BP monitoring frequency:  a few times a week BP range: 120-150/70's BP medication side effects:  no Medication compliance: good compliance Aspirin: yes Recurrent headaches: no Visual changes: no Palpitations: no Dyspnea: no Chest pain: no Lower extremity edema: no Dizzy/lightheaded: no   ROTATOR CUFF REPAIR (RIGHT) Had performed December 21st, Dr. Posey Pronto.  Right hand dominant.  Is currently doing PT twice a week and has difficulty with it.  Does not like pain medication prescribed, Oxycodone, as it makes her very nauseous and she can not function with that.  States it is difficult to do physical therapy and her PT suggested a muscle relaxer.  Does not want anything that will make her sleepy.    Past Medical History:  Diagnosis Date  . Anxiety   . Hypertension   . Shoulder pain     Past Surgical History:  Procedure Laterality Date  . DILATION AND CURETTAGE, DIAGNOSTIC / THERAPEUTIC    . rotary cuff Right 03/06/2019    Family History  Problem Relation Age of Onset  . Hypertension Mother   . Cancer Mother   . Hypertension Father   . Heart attack Father   . Stroke Sister   . Hypertension Sister   . Breast cancer Neg Hx     Social History    Socioeconomic History  . Marital status: Married    Spouse name: Not on file  . Number of children: 2  . Years of education: Not on file  . Highest education level: Not on file  Occupational History  . Not on file  Tobacco Use  . Smoking status: Never Smoker  . Smokeless tobacco: Never Used  Substance and Sexual Activity  . Alcohol use: No  . Drug use: No  . Sexual activity: Yes  Other Topics Concern  . Not on file  Social History Narrative  . Not on file   Social Determinants of Health   Financial Resource Strain: Low Risk   . Difficulty of Paying Living Expenses: Not hard at all  Food Insecurity: No Food Insecurity  . Worried About Charity fundraiser in the Last Year: Never true  . Ran Out of Food in the Last Year: Never true  Transportation Needs: No Transportation Needs  . Lack of Transportation (Medical): No  . Lack of Transportation (Non-Medical): No  Physical Activity: Sufficiently Active  . Days of Exercise per Week: 5 days  . Minutes of Exercise per Session: 30 min  Stress: No Stress Concern Present  . Feeling of Stress : Not at all  Social Connections: Not Isolated  . Frequency of Communication with Friends and Family: Three times a week  . Frequency of Social Gatherings with Friends  and Family: Three times a week  . Attends Religious Services: More than 4 times per year  . Active Member of Clubs or Organizations: Yes  . Attends Archivist Meetings: 1 to 4 times per year  . Marital Status: Married  Human resources officer Violence:   . Fear of Current or Ex-Partner: Not on file  . Emotionally Abused: Not on file  . Physically Abused: Not on file  . Sexually Abused: Not on file    ROS Review of Systems  Constitutional: Negative for activity change, appetite change, diaphoresis, fatigue and fever.  Respiratory: Negative for cough, chest tightness and shortness of breath.   Cardiovascular: Negative for chest pain, palpitations and leg swelling.    Gastrointestinal: Negative.   Endocrine: Negative.   Neurological: Negative.   Psychiatric/Behavioral: Negative.     Objective:   Today's Vitals: BP 129/70 (BP Location: Left Arm, Patient Position: Sitting, Cuff Size: Normal)   Pulse 88   Temp 97.8 F (36.6 C) (Oral)   Ht 5\' 2"  (1.575 m)   Wt 103 lb (46.7 kg)   SpO2 99%   BMI 18.84 kg/m   Physical Exam Vitals and nursing note reviewed.  Constitutional:      General: She is awake. She is not in acute distress.    Appearance: She is well-developed and well-groomed. She is not ill-appearing.  HENT:     Head: Normocephalic.     Right Ear: Hearing normal.     Left Ear: Hearing normal.     Nose: Nose normal.     Mouth/Throat:     Mouth: Mucous membranes are moist.  Eyes:     General: Lids are normal.        Right eye: No discharge.        Left eye: No discharge.     Conjunctiva/sclera: Conjunctivae normal.     Pupils: Pupils are equal, round, and reactive to light.  Neck:     Thyroid: No thyromegaly.     Vascular: No carotid bruit.  Cardiovascular:     Rate and Rhythm: Normal rate and regular rhythm.     Heart sounds: Normal heart sounds. No murmur. No gallop.   Pulmonary:     Effort: Pulmonary effort is normal. No accessory muscle usage or respiratory distress.     Breath sounds: Normal breath sounds.  Abdominal:     General: Bowel sounds are normal.     Palpations: Abdomen is soft.  Musculoskeletal:     Left upper arm: Normal.     Cervical back: Normal range of motion and neck supple.     Right lower leg: No edema.     Left lower leg: No edema.     Comments: Right arm (dominant arm) up in sling.  Skin:    General: Skin is warm and dry.  Neurological:     Mental Status: She is alert and oriented to person, place, and time.  Psychiatric:        Attention and Perception: Attention normal.        Mood and Affect: Mood normal.        Behavior: Behavior normal. Behavior is cooperative.        Thought Content:  Thought content normal.        Judgment: Judgment normal.     Assessment & Plan:   Problem List Items Addressed This Visit      Cardiovascular and Mediastinum   Benign essential HTN    Chronic, stable.  Recent  kidney function normal on hospital labs in December.  BP at goal today.  Continue current medication regimen and adjust as needed.  Will send refills upon request.  Plan on labs next visit, if able perform physical.  Will see how arm is healing.        Relevant Medications   aspirin 81 MG chewable tablet     Other   H/O repair of right rotator cuff    Performed in December 2020 by Dr. Posey Pronto.  She continue to have discomfort with PT, but unable to take Oxycodone.  Will send in script for Robaxin which may help during PT sessions to relax muscle and perform therapy.  Return in 4 weeks for visit or sooner if poor response to medication.         Outpatient Encounter Medications as of 04/10/2019  Medication Sig  . acetaminophen (TYLENOL) 500 MG tablet Take 2 tablets (1,000 mg total) by mouth every 8 (eight) hours.  . Ascorbic Acid (VITAMIN C PO) Take 1 tablet by mouth daily in the afternoon.  Marland Kitchen aspirin 81 MG chewable tablet Chew 81 mg by mouth daily.  Marland Kitchen lisinopril (PRINIVIL,ZESTRIL) 20 MG tablet Take 20 mg by mouth daily.  . Multiple Vitamin (MULTIVITAMIN) capsule Take 1 capsule by mouth daily.  . ondansetron (ZOFRAN ODT) 4 MG disintegrating tablet Take 1 tablet (4 mg total) by mouth every 8 (eight) hours as needed for nausea or vomiting.  . Turmeric 500 MG TABS Take 1,000 mg by mouth daily in the afternoon.  . methocarbamol (ROBAXIN) 500 MG tablet Take 1 tablet (500 mg total) by mouth every 6 (six) hours as needed for muscle spasms.  . [DISCONTINUED] oxyCODONE (ROXICODONE) 5 MG immediate release tablet Take 1-2 tablets (5-10 mg total) by mouth every 4 (four) hours as needed (pain). (Patient not taking: Reported on 04/10/2019)   No facility-administered encounter medications on  file as of 04/10/2019.    Follow-up: Return in about 4 weeks (around 05/08/2019) for Follow-up, possible physical if arm healed.   Venita Lick, NP

## 2019-05-11 ENCOUNTER — Ambulatory Visit: Payer: Medicare HMO | Attending: Internal Medicine

## 2019-05-12 ENCOUNTER — Ambulatory Visit: Payer: Medicare HMO | Attending: Internal Medicine

## 2019-05-12 DIAGNOSIS — Z23 Encounter for immunization: Secondary | ICD-10-CM | POA: Insufficient documentation

## 2019-05-12 NOTE — Progress Notes (Signed)
   Covid-19 Vaccination Clinic  Name:  Danielle Stephens    MRN: [id number] DOB: 04-04-1950  05/12/2019  Danielle Stephens was observed post Covid-19 immunization for 15 minutes without incidence. She was provided with Vaccine Information Sheet and instruction to access the V-Safe system.   Danielle Stephens was instructed to call 911 with any severe reactions post vaccine: Marland Kitchen Difficulty breathing  . Swelling of your face and throat  . A fast heartbeat  . A bad rash all over your body  . Dizziness and weakness    Immunizations Administered    Name Date Dose VIS Date Route   Pfizer COVID-19 Vaccine 05/12/2019  9:10 AM 0.3 mL 02/24/2019 Intramuscular   Manufacturer: [place]   Lot: [id number]   [place]: [id number]

## 2019-05-17 ENCOUNTER — Ambulatory Visit: Payer: Medicare HMO | Admitting: Nurse Practitioner

## 2019-06-07 ENCOUNTER — Ambulatory Visit: Payer: Medicare HMO | Attending: Internal Medicine

## 2019-06-07 DIAGNOSIS — Z23 Encounter for immunization: Secondary | ICD-10-CM

## 2019-06-07 NOTE — Progress Notes (Signed)
   Covid-19 Vaccination Clinic  Name:  Danielle Stephens    MRN: [id number] DOB: [birth year]  06/07/2019  [name] was observed post Covid-19 immunization for 15 minutes without incident. She was provided with Vaccine Information Sheet and instruction to access the V-Safe system.   [name] was instructed to call 911 with any severe reactions post vaccine: Marland Kitchen Difficulty breathing  . Swelling of face and throat  . A fast heartbeat  . A bad rash all over body  . Dizziness and weakness   Immunizations Administered    Name Date Dose VIS Date Route   Pfizer COVID-19 Vaccine 06/07/2019 10:38 AM 0.3 mL 02/24/2019 Intramuscular   Manufacturer: [employer]   Lot: [id number]   [place]: [id number]

## 2019-06-14 ENCOUNTER — Ambulatory Visit (INDEPENDENT_AMBULATORY_CARE_PROVIDER_SITE_OTHER): Payer: Medicare HMO | Admitting: Nurse Practitioner

## 2019-06-14 ENCOUNTER — Other Ambulatory Visit: Payer: Self-pay

## 2019-06-14 ENCOUNTER — Encounter: Payer: Self-pay | Admitting: Nurse Practitioner

## 2019-06-14 VITALS — BP 124/69 | HR 76 | Temp 97.7°F | Ht 60.5 in | Wt 102.6 lb

## 2019-06-14 DIAGNOSIS — I10 Essential (primary) hypertension: Secondary | ICD-10-CM | POA: Diagnosis not present

## 2019-06-14 MED ORDER — METHOCARBAMOL 500 MG PO TABS: 500.0000 mg | ORAL_TABLET | Freq: Four times a day (QID) | ORAL | 0 refills | Status: DC | PRN

## 2019-06-14 MED ORDER — LISINOPRIL 20 MG PO TABS: 20.0000 mg | ORAL_TABLET | Freq: Every day | ORAL | 3 refills | Status: DC

## 2019-06-14 NOTE — Progress Notes (Signed)
BP 124/69 (BP Location: Left Arm, Patient Position: Sitting, Cuff Size: Small)   Pulse 76 Comment: apical  Temp 97.7 F (36.5 C) (Oral)   Ht 5' 0.5" (1.537 m)   Wt 102 lb 9.6 oz (46.5 kg)   SpO2 100%   BMI 19.71 kg/m    Subjective:    Patient ID: Danielle Stephens, female    DOB: [birth year], 69 y.o.   MRN: [id number]  HPI: Danielle Stephens is a 69 y.o. female  Chief Complaint  Patient presents with  . Hypertension   HYPERTENSION Currently taking Lisinopril 20 MG daily + ASA.  She does request refill on Robaxin as has been helping her at physical therapy. Hypertension status: stable  Satisfied with current treatment? yes Duration of hypertension: chronic BP monitoring frequency:  twice a month BP range: at home check similar to office today BP medication side effects:  no Medication compliance: good compliance Aspirin: yes Recurrent headaches: no Visual changes: no Palpitations: no Dyspnea: no Chest pain: no Lower extremity edema: no Dizzy/lightheaded: no  Relevant past medical, surgical, family and social history reviewed and updated as indicated. Interim medical history since our last visit reviewed. Allergies and medications reviewed and updated.  Review of Systems  Constitutional: Negative for activity change, appetite change, diaphoresis, fatigue and fever.  Respiratory: Negative for cough, chest tightness and shortness of breath.   Cardiovascular: Negative for chest pain, palpitations and leg swelling.  Gastrointestinal: Negative.   Endocrine: Negative.   Neurological: Negative.   Psychiatric/Behavioral: Negative.     Per HPI unless specifically indicated above     Objective:    BP 124/69 (BP Location: Left Arm, Patient Position: Sitting, Cuff Size: Small)   Pulse 76 Comment: apical  Temp 97.7 F (36.5 C) (Oral)   Ht 5' 0.5" (1.537 m)   Wt 102 lb 9.6 oz (46.5 kg)   SpO2 100%   BMI 19.71 kg/m   Wt Readings from Last 3 Encounters:  06/14/19 102 lb 9.6 oz  (46.5 kg)  04/10/19 103 lb (46.7 kg)  03/06/19 103 lb (46.7 kg)    Physical Exam Vitals and nursing note reviewed.  Constitutional:      General: She is awake. She is not in acute distress.    Appearance: She is well-developed and well-groomed. She is not ill-appearing.  HENT:     Head: Normocephalic.     Right Ear: Hearing normal.     Left Ear: Hearing normal.     Nose: Nose normal.     Mouth/Throat:     Mouth: Mucous membranes are moist.  Eyes:     General: Lids are normal.        Right eye: No discharge.        Left eye: No discharge.     Conjunctiva/sclera: Conjunctivae normal.     Pupils: Pupils are equal, round, and reactive to light.  Neck:     Thyroid: No thyromegaly.     Vascular: No carotid bruit.  Cardiovascular:     Rate and Rhythm: Normal rate and regular rhythm.     Heart sounds: Normal heart sounds. No murmur. No gallop.   Pulmonary:     Effort: Pulmonary effort is normal. No accessory muscle usage or respiratory distress.     Breath sounds: Normal breath sounds.  Abdominal:     General: Bowel sounds are normal.     Palpations: Abdomen is soft.  Musculoskeletal:     Cervical back: Normal range of motion and  neck supple.     Right lower leg: No edema.     Left lower leg: No edema.  Skin:    General: Skin is warm and dry.  Neurological:     Mental Status: She is alert and oriented to person, place, and time.  Psychiatric:        Attention and Perception: Attention normal.        Mood and Affect: Mood normal.        Behavior: Behavior normal. Behavior is cooperative.        Thought Content: Thought content normal.        Judgment: Judgment normal.     Results for orders placed or performed during the hospital encounter of 03/02/19  SARS CORONAVIRUS 2 (TAT 6-24 HRS) Nasopharyngeal Nasopharyngeal Swab   Specimen: Nasopharyngeal Swab  Result Value Ref Range   SARS Coronavirus 2 NEGATIVE NEGATIVE      Assessment & Plan:   Problem List Items  Addressed This Visit      Cardiovascular and Mediastinum   Benign essential HTN - Primary    Chronic, stable.  BP at goal today and on occasional home readings.  Continue current medication regimen and adjust as needed.  Will send refill.  Obtain labs today, lipid panel, BMP, TSH.  Plan for physical in June or July.        Relevant Medications   lisinopril (ZESTRIL) 20 MG tablet   Other Relevant Orders   Basic metabolic panel   Lipid Panel w/o Chol/HDL Ratio   TSH       Follow up plan: Return in about 3 months (around 09/13/2019) for Annual physical.

## 2019-06-14 NOTE — Patient Instructions (Signed)
DASH Eating Plan DASH stands for "Dietary Approaches to Stop Hypertension." The DASH eating plan is a healthy eating plan that has been shown to reduce high blood pressure (hypertension). It may also reduce your risk for type 2 diabetes, heart disease, and stroke. The DASH eating plan may also help with weight loss. What are tips for following this plan?  General guidelines  Avoid eating more than 2,300 mg (milligrams) of salt (sodium) a day. If you have hypertension, you may need to reduce your sodium intake to 1,500 mg a day.  Limit alcohol intake to no more than 1 drink a day for nonpregnant women and 2 drinks a day for men. One drink equals 12 oz of beer, 5 oz of wine, or 1 oz of hard liquor.  Work with your health care provider to maintain a healthy body weight or to lose weight. Ask what an ideal weight is for you.  Get at least 30 minutes of exercise that causes your heart to beat faster (aerobic exercise) most days of the week. Activities may include walking, swimming, or biking.  Work with your health care provider or diet and nutrition specialist (dietitian) to adjust your eating plan to your individual calorie needs. Reading food labels   Check food labels for the amount of sodium per serving. Choose foods with less than 5 percent of the Daily Value of sodium. Generally, foods with less than 300 mg of sodium per serving fit into this eating plan.  To find whole grains, look for the word "whole" as the first word in the ingredient list. Shopping  Buy products labeled as "low-sodium" or "no salt added."  Buy fresh foods. Avoid canned foods and premade or frozen meals. Cooking  Avoid adding salt when cooking. Use salt-free seasonings or herbs instead of table salt or sea salt. Check with your health care provider or pharmacist before using salt substitutes.  Do not fry foods. Cook foods using healthy methods such as baking, boiling, grilling, and broiling instead.  Cook with  heart-healthy oils, such as olive, canola, soybean, or sunflower oil. Meal planning  Eat a balanced diet that includes: ? 5 or more servings of fruits and vegetables each day. At each meal, try to fill half of your plate with fruits and vegetables. ? Up to 6-8 servings of whole grains each day. ? Less than 6 oz of lean meat, poultry, or fish each day. A 3-oz serving of meat is about the same size as a deck of cards. One egg equals 1 oz. ? 2 servings of low-fat dairy each day. ? A serving of nuts, seeds, or beans 5 times each week. ? Heart-healthy fats. Healthy fats called Omega-3 fatty acids are found in foods such as flaxseeds and coldwater fish, like sardines, salmon, and mackerel.  Limit how much you eat of the following: ? Canned or prepackaged foods. ? Food that is high in trans fat, such as fried foods. ? Food that is high in saturated fat, such as fatty meat. ? Sweets, desserts, sugary drinks, and other foods with added sugar. ? Full-fat dairy products.  Do not salt foods before eating.  Try to eat at least 2 vegetarian meals each week.  Eat more home-cooked food and less restaurant, buffet, and fast food.  When eating at a restaurant, ask that your food be prepared with less salt or no salt, if possible. What foods are recommended? The items listed may not be a complete list. Talk with your dietitian about   what dietary choices are best for you. Grains Whole-grain or whole-wheat bread. Whole-grain or whole-wheat pasta. Brown rice. Oatmeal. Quinoa. Bulgur. Whole-grain and low-sodium cereals. Pita bread. Low-fat, low-sodium crackers. Whole-wheat flour tortillas. Vegetables Fresh or frozen vegetables (raw, steamed, roasted, or grilled). Low-sodium or reduced-sodium tomato and vegetable juice. Low-sodium or reduced-sodium tomato sauce and tomato paste. Low-sodium or reduced-sodium canned vegetables. Fruits All fresh, dried, or frozen fruit. Canned fruit in natural juice (without  added sugar). Meat and other protein foods Skinless chicken or turkey. Ground chicken or turkey. Pork with fat trimmed off. Fish and seafood. Egg whites. Dried beans, peas, or lentils. Unsalted nuts, nut butters, and seeds. Unsalted canned beans. Lean cuts of beef with fat trimmed off. Low-sodium, lean deli meat. Dairy Low-fat (1%) or fat-free (skim) milk. Fat-free, low-fat, or reduced-fat cheeses. Nonfat, low-sodium ricotta or cottage cheese. Low-fat or nonfat yogurt. Low-fat, low-sodium cheese. Fats and oils Soft margarine without trans fats. Vegetable oil. Low-fat, reduced-fat, or light mayonnaise and salad dressings (reduced-sodium). Canola, safflower, olive, soybean, and sunflower oils. Avocado. Seasoning and other foods Herbs. Spices. Seasoning mixes without salt. Unsalted popcorn and pretzels. Fat-free sweets. What foods are not recommended? The items listed may not be a complete list. Talk with your dietitian about what dietary choices are best for you. Grains Baked goods made with fat, such as croissants, muffins, or some breads. Dry pasta or rice meal packs. Vegetables Creamed or fried vegetables. Vegetables in a cheese sauce. Regular canned vegetables (not low-sodium or reduced-sodium). Regular canned tomato sauce and paste (not low-sodium or reduced-sodium). Regular tomato and vegetable juice (not low-sodium or reduced-sodium). Pickles. Olives. Fruits Canned fruit in a light or heavy syrup. Fried fruit. Fruit in cream or butter sauce. Meat and other protein foods Fatty cuts of meat. Ribs. Fried meat. Bacon. Sausage. Bologna and other processed lunch meats. Salami. Fatback. Hotdogs. Bratwurst. Salted nuts and seeds. Canned beans with added salt. Canned or smoked fish. Whole eggs or egg yolks. Chicken or turkey with skin. Dairy Whole or 2% milk, cream, and half-and-half. Whole or full-fat cream cheese. Whole-fat or sweetened yogurt. Full-fat cheese. Nondairy creamers. Whipped toppings.  Processed cheese and cheese spreads. Fats and oils Butter. Stick margarine. Lard. Shortening. Ghee. Bacon fat. Tropical oils, such as coconut, palm kernel, or palm oil. Seasoning and other foods Salted popcorn and pretzels. Onion salt, garlic salt, seasoned salt, table salt, and sea salt. Worcestershire sauce. Tartar sauce. Barbecue sauce. Teriyaki sauce. Soy sauce, including reduced-sodium. Steak sauce. Canned and packaged gravies. Fish sauce. Oyster sauce. Cocktail sauce. Horseradish that you find on the shelf. Ketchup. Mustard. Meat flavorings and tenderizers. Bouillon cubes. Hot sauce and Tabasco sauce. Premade or packaged marinades. Premade or packaged taco seasonings. Relishes. Regular salad dressings. Where to find more information:  National Heart, Lung, and Blood Institute: www.nhlbi.nih.gov  American Heart Association: www.heart.org Summary  The DASH eating plan is a healthy eating plan that has been shown to reduce high blood pressure (hypertension). It may also reduce your risk for type 2 diabetes, heart disease, and stroke.  With the DASH eating plan, you should limit salt (sodium) intake to 2,300 mg a day. If you have hypertension, you may need to reduce your sodium intake to 1,500 mg a day.  When on the DASH eating plan, aim to eat more fresh fruits and vegetables, whole grains, lean proteins, low-fat dairy, and heart-healthy fats.  Work with your health care provider or diet and nutrition specialist (dietitian) to adjust your eating plan to your   individual calorie needs. This information is not intended to replace advice given to you by your health care provider. Make sure you discuss any questions you have with your health care provider. Document Revised: 02/12/2017 Document Reviewed: 02/24/2016 Elsevier Patient Education  2020 Elsevier Inc.  

## 2019-06-14 NOTE — Assessment & Plan Note (Signed)
Chronic, stable.  BP at goal today and on occasional home readings.  Continue current medication regimen and adjust as needed.  Will send refill.  Obtain labs today, lipid panel, BMP, TSH.  Plan for physical in June or July.

## 2019-06-15 LAB — BASIC METABOLIC PANEL
BUN/Creatinine Ratio: 16 (ref 12–28)
BUN: 18 mg/dL (ref 8–27)
CO2: 26 mmol/L (ref 20–29)
Calcium: 9.5 mg/dL (ref 8.7–10.3)
Chloride: 101 mmol/L (ref 96–106)
Creatinine, Ser: 1.12 mg/dL — ABNORMAL HIGH (ref 0.57–1.00)
GFR calc Af Amer: 59 mL/min/{1.73_m2} — ABNORMAL LOW (ref >59)
GFR calc non Af Amer: 51 mL/min/{1.73_m2} — ABNORMAL LOW (ref >59)
Glucose: 97 mg/dL (ref 65–99)
Potassium: 4 mmol/L (ref 3.5–5.2)
Sodium: 142 mmol/L (ref 134–144)

## 2019-06-15 LAB — TSH: TSH: 0.823 u[IU]/mL (ref 0.450–4.500)

## 2019-06-15 LAB — LIPID PANEL W/O CHOL/HDL RATIO
Cholesterol, Total: 275 mg/dL — ABNORMAL HIGH (ref 100–199)
HDL: 70 mg/dL (ref >39)
LDL Chol Calc (NIH): 182 mg/dL — ABNORMAL HIGH (ref 0–99)
Triglycerides: 130 mg/dL (ref 0–149)
VLDL Cholesterol Cal: 23 mg/dL (ref 5–40)

## 2019-06-15 NOTE — Progress Notes (Signed)
Good morning, please let Danielle Stephens know her labs have returned.   - Her kidney function is showing some mild decline, some mild kidney disease.  Please make sure you are taking in good water daily and continue your Lisinopril, which is kidney protective.  Avoid pain medicine with Ibuprofen or Naproxen in it, as this can affect kidneys (things like Motrin, Aleeve).  Tylenol is okay, as is her muscle relaxer.  We will recheck this at her physical and monitor. - Cholesterol levels are elevated, we recheck fasting at her physical.  Recommend diet focus on low cholesterol. - Thyroid is normal If any questions please let me know.  Have a great day!!

## 2019-06-29 ENCOUNTER — Telehealth: Payer: Self-pay | Admitting: Nurse Practitioner

## 2019-06-29 NOTE — Telephone Encounter (Signed)
Spoke with patient. She just was wondering more of what Danielle Stephens said in regard to her kidney's. Patient had just had surgery and stated she had a lot going on at the moment.

## 2019-06-29 NOTE — Telephone Encounter (Signed)
Copied from [place] [id number]. Topic: General - Other >> Jun 29, 2019 10:22 AM Antonieta Iba C wrote: Reason for CRM: pt called in for assistance. Pt says that she would like to discuss her lab results.   CB: [phone]

## 2019-06-29 NOTE — Telephone Encounter (Signed)
Immunizations documented in chart.

## 2019-06-29 NOTE — Telephone Encounter (Signed)
Copied from [place] [id number]. Topic: General - Other >> Jun 29, 2019 10:21 AM Antonieta Iba C wrote: Reason for CRM: pt called in to make provider aware that she received her pfizer vaccine on 05/12/19 and 06/07/19.

## 2019-08-10 ENCOUNTER — Other Ambulatory Visit: Payer: Self-pay | Admitting: Orthopedic Surgery

## 2019-08-17 ENCOUNTER — Other Ambulatory Visit: Payer: Self-pay

## 2019-08-17 ENCOUNTER — Ambulatory Visit (INDEPENDENT_AMBULATORY_CARE_PROVIDER_SITE_OTHER): Payer: Medicare HMO | Admitting: Nurse Practitioner

## 2019-08-17 ENCOUNTER — Encounter: Payer: Medicare HMO | Admitting: Nurse Practitioner

## 2019-08-17 ENCOUNTER — Encounter: Payer: Self-pay | Admitting: Nurse Practitioner

## 2019-08-17 VITALS — BP 131/75 | HR 77 | Temp 97.9°F | Ht 60.9 in | Wt 103.4 lb

## 2019-08-17 DIAGNOSIS — Z1329 Encounter for screening for other suspected endocrine disorder: Secondary | ICD-10-CM | POA: Diagnosis not present

## 2019-08-17 DIAGNOSIS — Z780 Asymptomatic menopausal state: Secondary | ICD-10-CM | POA: Diagnosis not present

## 2019-08-17 DIAGNOSIS — E559 Vitamin D deficiency, unspecified: Secondary | ICD-10-CM

## 2019-08-17 DIAGNOSIS — N1831 Chronic kidney disease, stage 3a: Secondary | ICD-10-CM

## 2019-08-17 DIAGNOSIS — Z1159 Encounter for screening for other viral diseases: Secondary | ICD-10-CM | POA: Diagnosis not present

## 2019-08-17 DIAGNOSIS — E7800 Pure hypercholesterolemia, unspecified: Secondary | ICD-10-CM

## 2019-08-17 DIAGNOSIS — Z1211 Encounter for screening for malignant neoplasm of colon: Secondary | ICD-10-CM

## 2019-08-17 DIAGNOSIS — Z1231 Encounter for screening mammogram for malignant neoplasm of breast: Secondary | ICD-10-CM

## 2019-08-17 DIAGNOSIS — Z0000 Encounter for general adult medical examination without abnormal findings: Secondary | ICD-10-CM

## 2019-08-17 DIAGNOSIS — N183 Chronic kidney disease, stage 3 unspecified: Secondary | ICD-10-CM | POA: Insufficient documentation

## 2019-08-17 DIAGNOSIS — I10 Essential (primary) hypertension: Secondary | ICD-10-CM | POA: Diagnosis not present

## 2019-08-17 DIAGNOSIS — E782 Mixed hyperlipidemia: Secondary | ICD-10-CM | POA: Insufficient documentation

## 2019-08-17 NOTE — Assessment & Plan Note (Signed)
Noted on March labs with LDL 182 and TCHOL 275, ASCVD 9.2%.  Has been focused on diet changes.  Will recheck lipid panel today and initiate statin as needed.

## 2019-08-17 NOTE — Progress Notes (Signed)
BP 131/75   Pulse 77   Temp 97.9 F (36.6 C) (Oral)   Ht 5' 0.9" (1.547 m)   Wt 103 lb 6.4 oz (46.9 kg)   SpO2 99%   BMI 19.60 kg/m    Subjective:    Patient ID: Danielle Stephens, female    DOB: October 26, 1950, 69 y.o.   MRN: [id number]  HPI: Danielle Stephens is a 69 y.o. female presenting on 08/17/2019 for comprehensive medical examination. Current medical complaints include:none  She currently lives with: husband Menopausal Symptoms: no   HYPERTENSION Currently taking Lisinopril 20 MG daily + ASA.  Did have some mild kidney disease noted on March labs with CRT 1.12 and GFR 51, has been taking Meloxicam for shoulder discomfort, has upcoming surgery. Hypertension status: stable  Satisfied with current treatment? yes Duration of hypertension: chronic BP monitoring frequency:  twice a month BP range: 130/70 range at home BP medication side effects:  no Medication compliance: good compliance Aspirin: yes Recurrent headaches: no Visual changes: no Palpitations: no Dyspnea: no Chest pain: no Lower extremity edema: no Dizzy/lightheaded: no The 10-year ASCVD risk score Mikey Bussing DC Jr., et al., 2013) is: 9.2%   Values used to calculate the score:     Age: 25 years     Sex: Female     Is Non-Hispanic African American: No     Diabetic: No     Tobacco smoker: No     Systolic Blood Pressure: [id number] mmHg     Is BP treated: Yes     HDL Cholesterol: 70 mg/dL     Total Cholesterol: 275 mg/dL   Depression Screen done today and results listed below:  Depression screen [facility] 2/9 08/17/2019 04/10/2019  Decreased Interest 0 0  Down, Depressed, Hopeless 0 0  PHQ - 2 Score 0 0    The patient does not have a history of falls. I did not complete a risk assessment for falls. A plan of care for falls was not documented.   Past Medical History:  Past Medical History:  Diagnosis Date  . Anxiety   . Hypertension   . Shoulder pain     Surgical History:  Past Surgical History:  Procedure Laterality Date    . DILATION AND CURETTAGE, DIAGNOSTIC / THERAPEUTIC    . rotary cuff Right 03/06/2019    Medications:  Current Outpatient Medications on File Prior to Visit  Medication Sig  . acetaminophen (TYLENOL) 500 MG tablet Take 2 tablets (1,000 mg total) by mouth every 8 (eight) hours.  Marland Kitchen aspirin 81 MG chewable tablet Chew 81 mg by mouth daily.  Marland Kitchen lisinopril (ZESTRIL) 20 MG tablet Take 1 tablet (20 mg total) by mouth daily.  . meloxicam (MOBIC) 15 MG tablet Take 15 mg by mouth daily.  . Multiple Vitamin (MULTIVITAMIN) capsule Take 1 capsule by mouth daily.  . methocarbamol (ROBAXIN) 500 MG tablet Take 1 tablet (500 mg total) by mouth every 6 (six) hours as needed for muscle spasms. (Patient not taking: Reported on 08/16/2019)   No current facility-administered medications on file prior to visit.    Allergies:  Allergies  Allergen Reactions  . Tramadol Hives  . Codeine Rash    Social History:  Social History   Socioeconomic History  . Marital status: Married    Spouse name: Not on file  . Number of children: 2  . Years of education: Not on file  . Highest education level: Not on file  Occupational History  . Not on  file  Tobacco Use  . Smoking status: Never Smoker  . Smokeless tobacco: Never Used  Substance and Sexual Activity  . Alcohol use: No  . Drug use: No  . Sexual activity: Yes  Other Topics Concern  . Not on file  Social History Narrative  . Not on file   Social Determinants of Health   Financial Resource Strain: Low Risk   . Difficulty of Paying Living Expenses: Not hard at all  Food Insecurity: No Food Insecurity  . Worried About Charity fundraiser in the Last Year: Never true  . Ran Out of Food in the Last Year: Never true  Transportation Needs: No Transportation Needs  . Lack of Transportation (Medical): No  . Lack of Transportation (Non-Medical): No  Physical Activity: Sufficiently Active  . Days of Exercise per Week: 5 days  . Minutes of Exercise per  Session: 30 min  Stress: No Stress Concern Present  . Feeling of Stress : Not at all  Social Connections: Not Isolated  . Frequency of Communication with Friends and Family: Three times a week  . Frequency of Social Gatherings with Friends and Family: Three times a week  . Attends Religious Services: More than 4 times per year  . Active Member of Clubs or Organizations: Yes  . Attends Archivist Meetings: 1 to 4 times per year  . Marital Status: Married  Human resources officer Violence:   . Fear of Current or Ex-Partner:   . Emotionally Abused:   Marland Kitchen Physically Abused:   . Sexually Abused:    Social History   Tobacco Use  Smoking Status Never Smoker  Smokeless Tobacco Never Used   Social History   Substance and Sexual Activity  Alcohol Use No    Family History:  Family History  Problem Relation Age of Onset  . Hypertension Mother   . Cancer Mother   . Hypertension Father   . Heart attack Father   . Stroke Sister   . Hypertension Sister   . Heart attack Brother   . Hypertension Sister   . Hypertension Sister   . Hypertension Sister   . Hypertension Sister   . Hypertension Sister   . Down syndrome Brother   . Breast cancer Neg Hx     Past medical history, surgical history, medications, allergies, family history and social history reviewed with patient today and changes made to appropriate areas of the chart.   Review of Systems - negative All other ROS negative except what is listed above and in the HPI.      Objective:    BP 131/75   Pulse 77   Temp 97.9 F (36.6 C) (Oral)   Ht 5' 0.9" (1.547 m)   Wt 103 lb 6.4 oz (46.9 kg)   SpO2 99%   BMI 19.60 kg/m   Wt Readings from Last 3 Encounters:  08/17/19 103 lb 6.4 oz (46.9 kg)  06/14/19 102 lb 9.6 oz (46.5 kg)  04/10/19 103 lb (46.7 kg)    Physical Exam Constitutional:      General: She is awake. She is not in acute distress.    Appearance: She is well-developed. She is not ill-appearing.  HENT:      Head: Normocephalic and atraumatic.     Right Ear: Hearing, tympanic membrane, ear canal and external ear normal. No drainage.     Left Ear: Hearing, tympanic membrane, ear canal and external ear normal. No drainage.     Nose: Nose normal.  Right Sinus: No maxillary sinus tenderness or frontal sinus tenderness.     Left Sinus: No maxillary sinus tenderness or frontal sinus tenderness.     Mouth/Throat:     Mouth: Mucous membranes are moist.     Pharynx: Oropharynx is clear. Uvula midline. No pharyngeal swelling, oropharyngeal exudate or posterior oropharyngeal erythema.  Eyes:     General: Lids are normal.        Right eye: No discharge.        Left eye: No discharge.     Extraocular Movements: Extraocular movements intact.     Conjunctiva/sclera: Conjunctivae normal.     Pupils: Pupils are equal, round, and reactive to light.     Visual Fields: Right eye visual fields normal and left eye visual fields normal.  Neck:     Thyroid: No thyromegaly.     Vascular: No carotid bruit.     Trachea: Trachea normal.  Cardiovascular:     Rate and Rhythm: Normal rate and regular rhythm.     Heart sounds: Normal heart sounds. No murmur. No gallop.   Pulmonary:     Effort: Pulmonary effort is normal. No accessory muscle usage or respiratory distress.     Breath sounds: Normal breath sounds.  Chest:     Comments: Deferred today due to shoulder discomfort (has upcoming surgery) unable to take dress off and perform exam without discomfort. Abdominal:     General: Bowel sounds are normal.     Palpations: Abdomen is soft. There is no hepatomegaly or splenomegaly.     Tenderness: There is no abdominal tenderness.  Musculoskeletal:        General: Normal range of motion.     Cervical back: Normal range of motion and neck supple.     Right lower leg: No edema.     Left lower leg: No edema.  Lymphadenopathy:     Head:     Right side of head: No submental, submandibular, tonsillar, preauricular  or posterior auricular adenopathy.     Left side of head: No submental, submandibular, tonsillar, preauricular or posterior auricular adenopathy.     Cervical: No cervical adenopathy.  Skin:    General: Skin is warm and dry.     Capillary Refill: Capillary refill takes less than 2 seconds.     Findings: No rash.  Neurological:     Mental Status: She is alert and oriented to person, place, and time.     Cranial Nerves: Cranial nerves are intact.     Gait: Gait is intact.     Deep Tendon Reflexes: Reflexes are normal and symmetric.     Reflex Scores:      Brachioradialis reflexes are 2+ on the right side and 2+ on the left side.      Patellar reflexes are 2+ on the right side and 2+ on the left side. Psychiatric:        Attention and Perception: Attention normal.        Mood and Affect: Mood normal.        Speech: Speech normal.        Behavior: Behavior normal. Behavior is cooperative.        Thought Content: Thought content normal.        Judgment: Judgment normal.     Results for orders placed or performed in visit on [id number]  Basic metabolic panel  Result Value Ref Range   Glucose 97 65 - 99 mg/dL   BUN 18 8 - 27  mg/dL   Creatinine, Ser 1.12 (H) 0.57 - 1.00 mg/dL   GFR calc non Af Amer 51 (L) >59 mL/min/1.73   GFR calc Af Amer 59 (L) >59 mL/min/1.73   BUN/Creatinine Ratio 16 12 - 28   Sodium 142 134 - 144 mmol/L   Potassium 4.0 3.5 - 5.2 mmol/L   Chloride 101 96 - 106 mmol/L   CO2 26 20 - 29 mmol/L   Calcium 9.5 8.7 - 10.3 mg/dL  Lipid Panel w/o Chol/HDL Ratio  Result Value Ref Range   Cholesterol, Total 275 (H) 100 - 199 mg/dL   Triglycerides 130 0 - 149 mg/dL   HDL 70 >39 mg/dL   VLDL Cholesterol Cal 23 5 - 40 mg/dL   LDL Chol Calc (NIH) 182 (H) 0 - 99 mg/dL  TSH  Result Value Ref Range   TSH 0.823 0.450 - 4.500 uIU/mL      Assessment & Plan:   Problem List Items Addressed This Visit      Cardiovascular and Mediastinum   Benign essential HTN    Chronic,  stable.  BP at goal today and on occasional home readings.  Continue current medication regimen and adjust as needed.  Will send refills as needed.  Obtain labs today, lipid panel, BMP, TSH.  Recommend focus on DASH diet at home and continue to monitor BP regularly.  Return in 6 months.      Relevant Orders   Comprehensive metabolic panel   TSH     Genitourinary   CKD (chronic kidney disease) stage 3, GFR 30-59 ml/min    Noted on recent labs with GFR 51.  Continue Lisinopril for kidney protection.  Consider checking urine ALB next visit.  Monitor BMP and refer to nephrology as needed if decline in function presents.      Relevant Orders   CBC with Differential/Platelet   Comprehensive metabolic panel     Other   Elevated LDL cholesterol level    Noted on March labs with LDL 182 and TCHOL 275, ASCVD 9.2%.  Has been focused on diet changes.  Will recheck lipid panel today and initiate statin as needed.      Relevant Orders   Lipid Panel w/o Chol/HDL Ratio    Other Visit Diagnoses    Encounter for annual physical exam    -  Primary   Relevant Orders   CBC with Differential/Platelet   Vitamin D deficiency       Reports history of low levels, will recheck today and start supplement as needed.   Relevant Orders   VITAMIN D 25 Hydroxy (Vit-D Deficiency, Fractures)   Thyroid disorder screen       Check TSH   Relevant Orders   TSH   Need for hepatitis C screening test       Check Hep C today, discussed with patient.   Relevant Orders   Hepatitis C antibody   Postmenopausal estrogen deficiency       DEXA ordered   Relevant Orders   DG Bone Density   VITAMIN D 25 Hydroxy (Vit-D Deficiency, Fractures)   Encounter for screening mammogram for malignant neoplasm of breast       Mammogram ordered   Relevant Orders   MM 3D SCREEN BREAST BILATERAL   Colon cancer screening       GI referral   Relevant Orders   Ambulatory referral to Gastroenterology       Follow up plan: Return  in about 6 months (around 02/16/2020) for  HTN.   LABORATORY TESTING:  - Pap smear: not applicable  IMMUNIZATIONS:   - Tdap: Tetanus vaccination status reviewed: refused - Influenza: Up to date - Pneumovax: Not applicable - Prevnar:wishes to talk to insurance - HPV: Not applicable - Zostavax vaccine: wishes to talk to insurance  SCREENING: -Mammogram: Ordered today  - Colonoscopy: Ordered today  - Bone Density: Ordered today  -Hearing Test: Not applicable  -Spirometry: Not applicable   PATIENT COUNSELING:   Advised to take 1 mg of folate supplement per day if capable of pregnancy.   Sexuality: Discussed sexually transmitted diseases, partner selection, use of condoms, avoidance of unintended pregnancy  and contraceptive alternatives.   Advised to avoid cigarette smoking.  I discussed with the patient that most people either abstain from alcohol or drink within safe limits (<=14/week and <=4 drinks/occasion for males, <=7/weeks and <= 3 drinks/occasion for females) and that the risk for alcohol disorders and other health effects rises proportionally with the number of drinks per week and how often a drinker exceeds daily limits.  Discussed cessation/primary prevention of drug use and availability of treatment for abuse.   Diet: Encouraged to adjust caloric intake to maintain  or achieve ideal body weight, to reduce intake of dietary saturated fat and total fat, to limit sodium intake by avoiding high sodium foods and not adding table salt, and to maintain adequate dietary potassium and calcium preferably from fresh fruits, vegetables, and low-fat dairy products.    stressed the importance of regular exercise  Injury prevention: Discussed safety belts, safety helmets, smoke detector, smoking near bedding or upholstery.   Dental health: Discussed importance of regular tooth brushing, flossing, and dental visits.    NEXT PREVENTATIVE PHYSICAL DUE IN 1 YEAR. Return in about 6  months (around 02/16/2020) for HTN.

## 2019-08-17 NOTE — Assessment & Plan Note (Signed)
Noted on recent labs with GFR 51.  Continue Lisinopril for kidney protection.  Consider checking urine ALB next visit.  Monitor BMP and refer to nephrology as needed if decline in function presents.

## 2019-08-17 NOTE — Patient Instructions (Addendum)
[facility]  Address: [address] [place], [place], [place] [address]  Phone: [phone]  PCV13 -- pneumonia vaccine ask insurance Shingles vaccine -- ask [employer] if they cover in office or cover at drugstore  Bone Density Test The bone density test uses a special type of X-ray to measure the amount of calcium and other minerals in your bones. It can measure bone density in the hip and the spine. The test procedure is similar to having a regular X-ray. This test may also be called:  Bone densitometry.  Bone mineral density test.  Dual-energy X-ray absorptiometry (DEXA). You may have this test to:  Diagnose a condition that causes weak or thin bones (osteoporosis).  Screen you for osteoporosis.  Predict your risk for a broken bone (fracture).  Determine how well your osteoporosis treatment is working. Tell a health care provider about:  Any allergies you have.  All medicines you are taking, including vitamins, herbs, eye drops, creams, and over-the-counter medicines.  Any problems you or family members have had with anesthetic medicines.  Any blood disorders you have.  Any surgeries you have had.  Any medical conditions you have.  Whether you are pregnant or may be pregnant.  Any medical tests you have had within the past 14 days that used contrast material. What are the risks? Generally, this is a safe procedure. However, it does expose you to a small amount of radiation, which can slightly increase your cancer risk. What happens before the procedure?  Do not take any calcium supplements starting 24 hours before your test.  Remove all metal jewelry, eyeglasses, dental appliances, and any other metal objects. What happens during the procedure?   You will lie down on an exam table. There will be an X-ray generator below you and an imaging device above you.  Other devices, such as boxes or braces, may be used to position your body  properly for the scan.  The machine will slowly scan your body. You will need to keep still.  The images will show up on a screen in the room. Images will be examined by a specialist after your test is done. The procedure may vary among health care providers and hospitals. What happens after the procedure?  It is up to you to get your test results. Ask your health care provider, or the department that is doing the test, when your results will be ready. Summary  A bone density test is an imaging test that uses a type of X-ray to measure the amount of calcium and other minerals in your bones.  The test may be used to diagnose or screen you for a condition that causes weak or thin bones (osteoporosis), predict your risk for a broken bone (fracture), or determine how well your osteoporosis treatment is working.  Do not take any calcium supplements starting 24 hours before your test.  Ask your health care provider, or the department that is doing the test, when your results will be ready. This information is not intended to replace advice given to you by your health care provider. Make sure you discuss any questions you have with your health care provider. Document Revised: 03/18/2017 Document Reviewed: 01/04/2017 Elsevier Patient Education  [employer].

## 2019-08-17 NOTE — Assessment & Plan Note (Addendum)
Chronic, stable.  BP at goal today and on occasional home readings.  Continue current medication regimen and adjust as needed.  Will send refills as needed.  Obtain labs today, lipid panel, BMP, TSH.  Recommend focus on DASH diet at home and continue to monitor BP regularly.  Return in 6 months.

## 2019-08-18 ENCOUNTER — Telehealth: Payer: Self-pay | Admitting: Nurse Practitioner

## 2019-08-18 DIAGNOSIS — E559 Vitamin D deficiency, unspecified: Secondary | ICD-10-CM | POA: Insufficient documentation

## 2019-08-18 LAB — COMPREHENSIVE METABOLIC PANEL
ALT: 14 IU/L (ref 0–32)
AST: 24 IU/L (ref 0–40)
Albumin/Globulin Ratio: 1.8 (ref 1.2–2.2)
Albumin: 4.4 g/dL (ref 3.8–4.8)
Alkaline Phosphatase: 69 IU/L (ref 48–121)
BUN/Creatinine Ratio: 22 (ref 12–28)
BUN: 19 mg/dL (ref 8–27)
Bilirubin Total: 0.3 mg/dL (ref 0.0–1.2)
CO2: 26 mmol/L (ref 20–29)
Calcium: 9.8 mg/dL (ref 8.7–10.3)
Chloride: 101 mmol/L (ref 96–106)
Creatinine, Ser: 0.87 mg/dL (ref 0.57–1.00)
GFR calc Af Amer: 80 mL/min/{1.73_m2} (ref >59)
GFR calc non Af Amer: 70 mL/min/{1.73_m2} (ref >59)
Globulin, Total: 2.5 g/dL (ref 1.5–4.5)
Glucose: 88 mg/dL (ref 65–99)
Potassium: 5.2 mmol/L (ref 3.5–5.2)
Sodium: 140 mmol/L (ref 134–144)
Total Protein: 6.9 g/dL (ref 6.0–8.5)

## 2019-08-18 LAB — CBC WITH DIFFERENTIAL/PLATELET
Basophils Absolute: 0.1 10*3/uL (ref 0.0–0.2)
Basos: 1 %
EOS (ABSOLUTE): 0.2 10*3/uL (ref 0.0–0.4)
Eos: 5 %
Hematocrit: 41.5 % (ref 34.0–46.6)
Hemoglobin: 13.3 g/dL (ref 11.1–15.9)
Immature Grans (Abs): 0 10*3/uL (ref 0.0–0.1)
Immature Granulocytes: 1 %
Lymphocytes Absolute: 1 10*3/uL (ref 0.7–3.1)
Lymphs: 19 %
MCH: 27.4 pg (ref 26.6–33.0)
MCHC: 32 g/dL (ref 31.5–35.7)
MCV: 86 fL (ref 79–97)
Monocytes Absolute: 0.5 10*3/uL (ref 0.1–0.9)
Monocytes: 9 %
Neutrophils Absolute: 3.4 10*3/uL (ref 1.4–7.0)
Neutrophils: 65 %
Platelets: 260 10*3/uL (ref 150–450)
RBC: 4.85 x10E6/uL (ref 3.77–5.28)
RDW: 13 % (ref 11.7–15.4)
WBC: 5.1 10*3/uL (ref 3.4–10.8)

## 2019-08-18 LAB — TSH: TSH: 1.66 u[IU]/mL (ref 0.450–4.500)

## 2019-08-18 LAB — HEPATITIS C ANTIBODY: Hep C Virus Ab: <0.1 s/co ratio (ref 0.0–0.9)

## 2019-08-18 LAB — LIPID PANEL W/O CHOL/HDL RATIO
Cholesterol, Total: 267 mg/dL — ABNORMAL HIGH (ref 100–199)
HDL: 75 mg/dL (ref >39)
LDL Chol Calc (NIH): 178 mg/dL — ABNORMAL HIGH (ref 0–99)
Triglycerides: 83 mg/dL (ref 0–149)
VLDL Cholesterol Cal: 14 mg/dL (ref 5–40)

## 2019-08-18 LAB — VITAMIN D 25 HYDROXY (VIT D DEFICIENCY, FRACTURES): Vit D, 25-Hydroxy: 23 ng/mL — ABNORMAL LOW (ref 30.0–100.0)

## 2019-08-18 NOTE — Telephone Encounter (Signed)
Reviewed patient's labs with her via telephone.  Overall good labs with exception of low Vit D.  Recommended she start taking Vitamin D3 1000 units daily, which she can obtain OTC.  Cholesterol levels remains elevated with LDL >170, but ASCVD 8.9%.  At this time recommend focus on diet changes at home and educated her on this + recommend she look at the [place] web site for more information on diet changes and recipes.  She stated appreciation for call and was able to verbalize plan back to provider.

## 2019-08-18 NOTE — Progress Notes (Signed)
Spoke to patient on telephone, review telephone note dated 08/18/19.

## 2019-08-22 ENCOUNTER — Other Ambulatory Visit: Payer: Self-pay

## 2019-08-22 ENCOUNTER — Encounter
Admission: RE | Admit: 2019-08-22 | Discharge: 2019-08-22 | Disposition: A | Discharge status: Home / Self Care | Payer: Medicare HMO | Source: Ambulatory Visit | Attending: Orthopedic Surgery | Admitting: Orthopedic Surgery

## 2019-08-22 DIAGNOSIS — Z01810 Encounter for preprocedural cardiovascular examination: Secondary | ICD-10-CM | POA: Insufficient documentation

## 2019-08-22 DIAGNOSIS — I10 Essential (primary) hypertension: Secondary | ICD-10-CM | POA: Diagnosis not present

## 2019-08-22 DIAGNOSIS — Z20822 Contact with and (suspected) exposure to covid-19: Secondary | ICD-10-CM | POA: Insufficient documentation

## 2019-08-22 NOTE — Patient Instructions (Addendum)
Your procedure is scheduled on: Monday 6/14 Report to Day Surgery. Medical Danielle Stephens To find out your arrival time please call [phone] between 1PM - 3PM on  Friday 6/11 .  Remember: Instructions that are not followed completely may result in serious medical risk,  up to and including death, or upon the discretion of your surgeon and anesthesiologist your  surgery may need to be rescheduled.     _X__ 1. Do not eat food after midnight the night before your procedure.                 No gum chewing or hard candies. You may drink clear liquids up to 2 hours                 before you are scheduled to arrive for your surgery- DO not drink clear                 liquids within 2 hours of the start of your surgery.                 Clear Liquids include:  water, apple juice without pulp, clear Gatorade, G2 or                  Gatorade Zero (avoid Red/Purple/Blue), Black Coffee or Tea (Do not add                 anything to coffee or tea). _x____2.   Complete the carbohydrate drink provided to you, 2 hours before arrival.  __X__2.  On the morning of surgery brush your teeth with toothpaste and water, you                may rinse your mouth with mouthwash if you wish.  Do not swallow any toothpaste of mouthwash.     ___ 3.  No Alcohol for 24 hours before or after surgery.   ___ 4.  Do Not Smoke or use e-cigarettes For 24 Hours Prior to Your Surgery.                 Do not use any chewable tobacco products for at least 6 hours prior to                 Surgery.  ___  5.  Do not use any recreational drugs (marijuana, cocaine, heroin, ecstacy, MDMA or other)                For at least one week prior to your surgery.  Combination of these drugs with anesthesia                May have life threatening results.  ____  6.  Bring all medications with you on the day of surgery if instructed.   __x__  7.  Notify your doctor if there is any change in your medical condition   (cold, fever, infections).     Do not wear jewelry, make-up, hairpins, clips or nail polish. Do not wear lotions, powders, or perfumes.  Do not shave 48 hours prior to surgery.  Do not bring valuables to the hospital.    [facility] is not responsible for any belongings or valuables.  Contacts, dentures or bridgework may not be worn into surgery. Leave your suitcase in the car. After surgery it may be brought to your room. For patients admitted to the hospital, discharge time is determined by your treatment team.   Patients discharged the day of  surgery will not be allowed to drive home.   Make arrangements for someone to be with you for the first 24 hours of your Same Day Discharge.    Please read over the following fact sheets that you were given:  Incentive spirometer   __x__ Take these medicines the morning of surgery with A SIP OF WATER:    1. Pain medication as needed  2.   3.   4.  5.  6.  ____ Fleet Enema (as directed)   __x__ Use CHG Soap (or wipes) as directed  ____ Use Benzoyl Peroxide Gel as instructed  ____ Use inhalers on the day of surgery  ____ Stop metformin 2 days prior to surgery    ____ Take 1/2 of usual insulin dose the night before surgery. No insulin the morning          of surgery.   __x__ Stop aspirin today   __x__ Stop Anti-inflammatories meloxicam (MOBIC) 15 MG tablet aleve or ibuprofen until after the surgery  May take tylenol  ____ Stop supplements until after surgery.    ____ Bring C-Pap to the hospital.   Bring your sling to hospital Obtain stool softener for after surgery. Use as needed for constipation related to pain medication

## 2019-08-24 ENCOUNTER — Other Ambulatory Visit: Payer: Self-pay

## 2019-08-24 ENCOUNTER — Telehealth (INDEPENDENT_AMBULATORY_CARE_PROVIDER_SITE_OTHER): Payer: Self-pay | Admitting: Gastroenterology

## 2019-08-24 ENCOUNTER — Encounter
Admission: RE | Admit: 2019-08-24 | Discharge: 2019-08-24 | Disposition: A | Discharge status: Home / Self Care | Payer: Medicare HMO | Source: Ambulatory Visit | Attending: Orthopedic Surgery | Admitting: Orthopedic Surgery

## 2019-08-24 ENCOUNTER — Other Ambulatory Visit: Payer: Medicare HMO

## 2019-08-24 DIAGNOSIS — Z01810 Encounter for preprocedural cardiovascular examination: Secondary | ICD-10-CM | POA: Diagnosis not present

## 2019-08-24 DIAGNOSIS — Z1211 Encounter for screening for malignant neoplasm of colon: Secondary | ICD-10-CM

## 2019-08-24 DIAGNOSIS — I10 Essential (primary) hypertension: Secondary | ICD-10-CM

## 2019-08-24 NOTE — Progress Notes (Signed)
Gastroenterology Pre-Procedure Review  Request Date: Not Scheduled Yet Due to Shoulder Surgery Next Week Requesting Physician: TBD  PATIENT REVIEW QUESTIONS: The patient responded to the following health history questions as indicated:    1. Are you having any GI issues? no 2. Do you have a personal history of Polyps? no 3. Do you have a family history of Colon Cancer or Polyps? yes (mother had colon cancer) 4. Diabetes Mellitus? no 5. Joint replacements in the past 12 months?Shoulder surgery in December 2020, Shoulder Surgery next week. 6. Major health problems in the past 3 months?Shoulder surgery next week. 7. Any artificial heart valves, MVP, or defibrillator?no    MEDICATIONS & ALLERGIES:    Patient reports the following regarding taking any anticoagulation/antiplatelet therapy:   Plavix, Coumadin, Eliquis, Xarelto, Lovenox, Pradaxa, Brilinta, or Effient? no Aspirin? yes (81 mg daily)  Patient confirms/reports the following medications:  Current Outpatient Medications  Medication Sig Dispense Refill  . acetaminophen (TYLENOL) 500 MG tablet Take 2 tablets (1,000 mg total) by mouth every 8 (eight) hours. 90 tablet 2  . aspirin 81 MG chewable tablet Chew 81 mg by mouth daily.    . cholecalciferol (VITAMIN D3) 25 MCG (1000 UNIT) tablet Take 2,000 Units by mouth daily. gummie    . diphenhydrAMINE HCl, Sleep, (SLEEP AID) 25 MG CAPS Take 1 capsule by mouth.    Marland Kitchen lisinopril (ZESTRIL) 20 MG tablet Take 1 tablet (20 mg total) by mouth daily. 90 tablet 3  . meloxicam (MOBIC) 15 MG tablet Take 15 mg by mouth daily.    . methocarbamol (ROBAXIN) 500 MG tablet Take 1 tablet (500 mg total) by mouth every 6 (six) hours as needed for muscle spasms. 60 tablet 0  . Multiple Vitamin (MULTIVITAMIN) capsule Take 1 capsule by mouth daily.    . diphenhydrAMINE (BENADRYL) 25 mg capsule Take 25 mg by mouth at bedtime as needed. (Patient not taking: Reported on 08/24/2019)     No current  facility-administered medications for this visit.    Patient confirms/reports the following allergies:  Allergies  Allergen Reactions  . Codeine Rash  . Tramadol Hives    No orders of the defined types were placed in this encounter.   AUTHORIZATION INFORMATION Primary Insurance: 1D#: Group #:  Secondary Insurance: 1D#: Group #:  SCHEDULE INFORMATION: Date: Patient will call back once she has recovered from shoulder surgery Location: TBD

## 2019-08-25 LAB — SARS CORONAVIRUS 2 (TAT 6-24 HRS): SARS Coronavirus 2: NEGATIVE

## 2019-08-28 ENCOUNTER — Other Ambulatory Visit: Payer: Self-pay

## 2019-08-28 ENCOUNTER — Encounter
Admission: RE | Disposition: A | Discharge status: Home / Self Care | Payer: Self-pay | Source: Home / Self Care | Attending: Orthopedic Surgery

## 2019-08-28 ENCOUNTER — Ambulatory Visit: Payer: Medicare HMO | Admitting: Anesthesiology

## 2019-08-28 ENCOUNTER — Encounter: Payer: Self-pay | Admitting: Orthopedic Surgery

## 2019-08-28 ENCOUNTER — Ambulatory Visit
Admission: RE | Admit: 2019-08-28 | Discharge: 2019-08-28 | Disposition: A | Discharge status: Home / Self Care | Payer: Medicare HMO | Attending: Orthopedic Surgery | Admitting: Orthopedic Surgery

## 2019-08-28 ENCOUNTER — Ambulatory Visit: Payer: Medicare HMO

## 2019-08-28 DIAGNOSIS — Z79899 Other long term (current) drug therapy: Secondary | ICD-10-CM | POA: Insufficient documentation

## 2019-08-28 DIAGNOSIS — M7551 Bursitis of right shoulder: Secondary | ICD-10-CM | POA: Insufficient documentation

## 2019-08-28 DIAGNOSIS — Z791 Long term (current) use of non-steroidal anti-inflammatories (NSAID): Secondary | ICD-10-CM | POA: Insufficient documentation

## 2019-08-28 DIAGNOSIS — I129 Hypertensive chronic kidney disease with stage 1 through stage 4 chronic kidney disease, or unspecified chronic kidney disease: Secondary | ICD-10-CM | POA: Insufficient documentation

## 2019-08-28 DIAGNOSIS — M7501 Adhesive capsulitis of right shoulder: Secondary | ICD-10-CM | POA: Diagnosis present

## 2019-08-28 DIAGNOSIS — Z87891 Personal history of nicotine dependence: Secondary | ICD-10-CM | POA: Diagnosis not present

## 2019-08-28 DIAGNOSIS — Z7982 Long term (current) use of aspirin: Secondary | ICD-10-CM | POA: Diagnosis not present

## 2019-08-28 DIAGNOSIS — N189 Chronic kidney disease, unspecified: Secondary | ICD-10-CM | POA: Diagnosis not present

## 2019-08-28 DIAGNOSIS — Z419 Encounter for procedure for purposes other than remedying health state, unspecified: Secondary | ICD-10-CM

## 2019-08-28 HISTORY — PX: CLOSED MANIPULATION SHOULDER WITH STERIOD INJECTION: SHX5611

## 2019-08-28 HISTORY — PX: SHOULDER ARTHROSCOPY: SHX128

## 2019-08-28 SURGERY — ARTHROSCOPY, SHOULDER
Anesthesia: Regional | Site: Shoulder | Laterality: Right

## 2019-08-28 MED ORDER — ASPIRIN EC 325 MG PO TBEC
325.0000 mg | DELAYED_RELEASE_TABLET | Freq: Every day | ORAL | 0 refills | Status: AC
Start: 2019-08-28 — End: 2019-09-11

## 2019-08-28 MED ORDER — EPINEPHRINE PF 1 MG/ML IJ SOLN
INTRAMUSCULAR | Status: AC
  Filled 2019-08-28: qty 4

## 2019-08-28 MED ORDER — ONDANSETRON 4 MG PO TBDP
4.0000 mg | ORAL_TABLET | Freq: Three times a day (TID) | ORAL | 0 refills | Status: DC | PRN
Start: 2019-08-28 — End: 2019-09-21

## 2019-08-28 MED ORDER — BUPIVACAINE HCL (PF) 0.5 % IJ SOLN
INTRAMUSCULAR | Status: AC
  Filled 2019-08-28: qty 30

## 2019-08-28 MED ORDER — OXYCODONE HCL 5 MG PO TABS: 5.0000 mg | ORAL_TABLET | ORAL | 0 refills | Status: DC | PRN

## 2019-08-28 MED ORDER — BUPIVACAINE HCL (PF) 0.5 % IJ SOLN
INTRAMUSCULAR | Status: DC | PRN
Start: 2019-08-28 — End: 2019-08-28
  Administered 2019-08-28 (×2): 5 mL

## 2019-08-28 MED ORDER — CHLORHEXIDINE GLUCONATE 0.12 % MT SOLN
OROMUCOSAL | Status: AC
  Administered 2019-08-28: 15 mL via OROMUCOSAL
  Filled 2019-08-28: qty 15

## 2019-08-28 MED ORDER — ROCURONIUM BROMIDE 100 MG/10ML IV SOLN
INTRAVENOUS | Status: DC | PRN
  Administered 2019-08-28: 10 mg via INTRAVENOUS
  Administered 2019-08-28: 30 mg via INTRAVENOUS

## 2019-08-28 MED ORDER — TRIAMCINOLONE ACETONIDE 40 MG/ML IJ SUSP
INTRAMUSCULAR | Status: DC | PRN
  Administered 2019-08-28: 2 mL

## 2019-08-28 MED ORDER — FENTANYL CITRATE (PF) 100 MCG/2ML IJ SOLN
50.0000 ug | INTRAMUSCULAR | Status: AC | PRN
  Administered 2019-08-28: 50 ug via INTRAVENOUS
  Administered 2019-08-28: 25 ug via INTRAVENOUS

## 2019-08-28 MED ORDER — FAMOTIDINE 20 MG PO TABS: 20.0000 mg | ORAL_TABLET | Freq: Once | ORAL | Status: AC

## 2019-08-28 MED ORDER — LIDOCAINE HCL (PF) 2 % IJ SOLN
INTRAMUSCULAR | Status: AC
  Filled 2019-08-28: qty 5

## 2019-08-28 MED ORDER — MIDAZOLAM HCL 2 MG/2ML IJ SOLN: 1.0000 mg | INTRAMUSCULAR | Status: DC | PRN

## 2019-08-28 MED ORDER — ONDANSETRON HCL 4 MG/2ML IJ SOLN
INTRAMUSCULAR | Status: DC | PRN
  Administered 2019-08-28: 4 mg via INTRAVENOUS

## 2019-08-28 MED ORDER — OXYCODONE HCL 5 MG PO TABS: 5.0000 mg | ORAL_TABLET | Freq: Once | ORAL | Status: DC | PRN

## 2019-08-28 MED ORDER — OXYCODONE HCL 5 MG/5ML PO SOLN: 5.0000 mg | Freq: Once | ORAL | Status: DC | PRN

## 2019-08-28 MED ORDER — SUGAMMADEX SODIUM 200 MG/2ML IV SOLN
INTRAVENOUS | Status: DC | PRN
  Administered 2019-08-28: 100 mg via INTRAVENOUS

## 2019-08-28 MED ORDER — PHENYLEPHRINE HCL-NACL 10-0.9 MG/250ML-% IV SOLN
INTRAVENOUS | Status: DC | PRN
  Administered 2019-08-28: 40 ug/min via INTRAVENOUS

## 2019-08-28 MED ORDER — IBUPROFEN 800 MG PO TABS
800.0000 mg | ORAL_TABLET | Freq: Three times a day (TID) | ORAL | 1 refills | Status: AC
Start: 2019-08-28 — End: 2019-09-11

## 2019-08-28 MED ORDER — CEFAZOLIN SODIUM-DEXTROSE 2-4 GM/100ML-% IV SOLN
2.0000 g | INTRAVENOUS | Status: AC
  Administered 2019-08-28: 2 g via INTRAVENOUS

## 2019-08-28 MED ORDER — LIDOCAINE HCL (CARDIAC) PF 100 MG/5ML IV SOSY
PREFILLED_SYRINGE | INTRAVENOUS | Status: DC | PRN
  Administered 2019-08-28: 50 mg via INTRAVENOUS

## 2019-08-28 MED ORDER — LIDOCAINE HCL (PF) 1 % IJ SOLN
INTRAMUSCULAR | Status: DC | PRN
  Administered 2019-08-28: 2 mL

## 2019-08-28 MED ORDER — BUPIVACAINE HCL 0.5 % IJ SOLN
INTRAMUSCULAR | Status: DC | PRN
  Administered 2019-08-28: 8 mL

## 2019-08-28 MED ORDER — FENTANYL CITRATE (PF) 100 MCG/2ML IJ SOLN: 25.0000 ug | INTRAMUSCULAR | Status: DC | PRN

## 2019-08-28 MED ORDER — FENTANYL CITRATE (PF) 100 MCG/2ML IJ SOLN
INTRAMUSCULAR | Status: AC
  Administered 2019-08-28: 50 ug via INTRAVENOUS
  Filled 2019-08-28: qty 2

## 2019-08-28 MED ORDER — ORAL CARE MOUTH RINSE: 15.0000 mL | Freq: Once | OROMUCOSAL | Status: AC

## 2019-08-28 MED ORDER — PROPOFOL 10 MG/ML IV BOLUS
INTRAVENOUS | Status: AC
  Filled 2019-08-28: qty 40

## 2019-08-28 MED ORDER — BUPIVACAINE LIPOSOME 1.3 % IJ SUSP
INTRAMUSCULAR | Status: AC
  Filled 2019-08-28: qty 20

## 2019-08-28 MED ORDER — ONDANSETRON HCL 4 MG/2ML IJ SOLN: 4.0000 mg | Freq: Once | INTRAMUSCULAR | Status: DC | PRN

## 2019-08-28 MED ORDER — BUPIVACAINE LIPOSOME 1.3 % IJ SUSP
INTRAMUSCULAR | Status: DC | PRN
  Administered 2019-08-28 (×4): 5 mL

## 2019-08-28 MED ORDER — LIDOCAINE HCL (PF) 1 % IJ SOLN
INTRAMUSCULAR | Status: AC
  Filled 2019-08-28: qty 5

## 2019-08-28 MED ORDER — MIDAZOLAM HCL 2 MG/2ML IJ SOLN
INTRAMUSCULAR | Status: AC
  Administered 2019-08-28: 1 mg via INTRAVENOUS
  Filled 2019-08-28: qty 2

## 2019-08-28 MED ORDER — ROCURONIUM BROMIDE 10 MG/ML (PF) SYRINGE
PREFILLED_SYRINGE | INTRAVENOUS | Status: AC
  Filled 2019-08-28: qty 10

## 2019-08-28 MED ORDER — PROPOFOL 10 MG/ML IV BOLUS
INTRAVENOUS | Status: DC | PRN
  Administered 2019-08-28: 100 mg via INTRAVENOUS

## 2019-08-28 MED ORDER — ONDANSETRON HCL 4 MG/2ML IJ SOLN
INTRAMUSCULAR | Status: AC
  Filled 2019-08-28: qty 2

## 2019-08-28 MED ORDER — PHENYLEPHRINE HCL (PRESSORS) 10 MG/ML IV SOLN
INTRAVENOUS | Status: DC | PRN
  Administered 2019-08-28 (×3): 100 ug via INTRAVENOUS

## 2019-08-28 MED ORDER — DEXAMETHASONE SODIUM PHOSPHATE 10 MG/ML IJ SOLN
INTRAMUSCULAR | Status: DC | PRN
  Administered 2019-08-28: 10 mg via INTRAVENOUS

## 2019-08-28 MED ORDER — CHLORHEXIDINE GLUCONATE 0.12 % MT SOLN: 15.0000 mL | Freq: Once | OROMUCOSAL | Status: AC

## 2019-08-28 MED ORDER — TRIAMCINOLONE ACETONIDE 40 MG/ML IJ SUSP
INTRAMUSCULAR | Status: AC
  Filled 2019-08-28: qty 1

## 2019-08-28 MED ORDER — CEFAZOLIN SODIUM-DEXTROSE 2-4 GM/100ML-% IV SOLN
INTRAVENOUS | Status: AC
  Filled 2019-08-28: qty 100

## 2019-08-28 MED ORDER — ACETAMINOPHEN 500 MG PO TABS
1000.0000 mg | ORAL_TABLET | Freq: Three times a day (TID) | ORAL | 2 refills | Status: DC
Start: 2019-08-28 — End: 2020-02-16

## 2019-08-28 MED ORDER — BUPIVACAINE HCL (PF) 0.5 % IJ SOLN
INTRAMUSCULAR | Status: AC
  Filled 2019-08-28: qty 10

## 2019-08-28 MED ORDER — FAMOTIDINE 20 MG PO TABS
ORAL_TABLET | ORAL | Status: AC
  Administered 2019-08-28: 20 mg via ORAL
  Filled 2019-08-28: qty 1

## 2019-08-28 MED ORDER — LACTATED RINGERS IV SOLN: INTRAVENOUS | Status: DC

## 2019-08-28 MED ORDER — FENTANYL CITRATE (PF) 100 MCG/2ML IJ SOLN
INTRAMUSCULAR | Status: AC
  Filled 2019-08-28: qty 2

## 2019-08-28 SURGICAL SUPPLY — 76 items
ADAPTER IRRIG TUBE 2 SPIKE SOL (ADAPTER) ×4 IMPLANT
ADH SKN CLS APL DERMABOND .7 (GAUZE/BANDAGES/DRESSINGS)
ADPR TBG 2 SPK PMP STRL ASCP (ADAPTER) ×2
APL PRP STRL LF DISP 70% ISPRP (MISCELLANEOUS) ×1
BNDG ADH 2 X3.75 FABRIC TAN LF (GAUZE/BANDAGES/DRESSINGS) ×2 IMPLANT
BNDG ADH XL 3.75X2 STRCH LF (GAUZE/BANDAGES/DRESSINGS) ×1
BUR BR 5.5 12 FLUTE (BURR) ×1 IMPLANT
BUR RADIUS 4.0X18.5 (BURR) ×2 IMPLANT
CANNULA 5.75X7 CRYSTAL CLEAR (CANNULA) ×1 IMPLANT
CANNULA PART THRD DISP 5.75X7 (CANNULA) IMPLANT
CANNULA PARTIAL THREAD 2X7 (CANNULA) IMPLANT
CANNULA TWIST IN 8.25X9CM (CANNULA) IMPLANT
CHLORAPREP W/TINT 26 (MISCELLANEOUS) ×2 IMPLANT
COOLER POLAR GLACIER W/PUMP (MISCELLANEOUS) ×1 IMPLANT
COVER SWITCH WIDE FT NSTRL (MISCELLANEOUS) ×2 IMPLANT
COVER WAND RF STERILE (DRAPES) ×2 IMPLANT
CRADLE LAMINECT ARM (MISCELLANEOUS) ×2 IMPLANT
DERMABOND ADVANCED (GAUZE/BANDAGES/DRESSINGS)
DERMABOND ADVANCED .7 DNX12 (GAUZE/BANDAGES/DRESSINGS) IMPLANT
DRAPE 3/4 80X56 (DRAPES) ×2 IMPLANT
DRAPE IMP U-DRAPE 54X76 (DRAPES) ×4 IMPLANT
DRAPE INCISE IOBAN 66X45 STRL (DRAPES) ×2 IMPLANT
DRAPE U-SHAPE 47X51 STRL (DRAPES) ×4 IMPLANT
DRSG TEGADERM 4X4.75 (GAUZE/BANDAGES/DRESSINGS) ×8 IMPLANT
ELECT REM PT RETURN 9FT ADLT (ELECTROSURGICAL) ×2
ELECTRODE REM PT RTRN 9FT ADLT (ELECTROSURGICAL) ×1 IMPLANT
GAUZE SPONGE 4X4 12PLY STRL (GAUZE/BANDAGES/DRESSINGS) ×2 IMPLANT
GAUZE XEROFORM 1X8 LF (GAUZE/BANDAGES/DRESSINGS) ×2 IMPLANT
GLOVE BIO SURGEON STRL SZ7.5 (GLOVE) ×2 IMPLANT
GLOVE BIOGEL PI IND STRL 8 (GLOVE) ×2 IMPLANT
GLOVE BIOGEL PI INDICATOR 8 (GLOVE) ×2
GLOVE SURG ORTHO 8.0 STRL STRW (GLOVE) ×3 IMPLANT
GLOVE SURG SYN 8.0 (GLOVE) ×2 IMPLANT
GLOVE SURG SYN 8.0 PF PI (GLOVE) ×1 IMPLANT
GOWN STRL REUS W/ TWL LRG LVL3 (GOWN DISPOSABLE) ×2 IMPLANT
GOWN STRL REUS W/TWL LRG LVL3 (GOWN DISPOSABLE) ×4
GOWN STRL REUS W/TWL XL LVL4 (GOWN DISPOSABLE) ×2 IMPLANT
IV LACTATED RINGER IRRG 3000ML (IV SOLUTION) ×28
IV LR IRRIG 3000ML ARTHROMATIC (IV SOLUTION) ×4 IMPLANT
KIT STABILIZATION SHOULDER (MISCELLANEOUS) ×2 IMPLANT
KIT SUTURETAK 3.0 INSERT PERC (KITS) IMPLANT
KIT TURNOVER KIT A (KITS) ×2 IMPLANT
MANIFOLD NEPTUNE II (INSTRUMENTS) ×2 IMPLANT
MASK FACE SPIDER DISP (MASK) ×2 IMPLANT
MAT ABSORB  FLUID 56X50 GRAY (MISCELLANEOUS) ×4
MAT ABSORB FLUID 56X50 GRAY (MISCELLANEOUS) ×2 IMPLANT
NDL MAYO CATGUT SZ5 (NEEDLE)
NDL SAFETY ECLIPSE 18X1.5 (NEEDLE) ×1 IMPLANT
NDL SCORPION MULTI FIRE (NEEDLE) IMPLANT
NDL SUT 5 .5 CRC TPR PNT MAYO (NEEDLE) IMPLANT
NEEDLE HYPO 18GX1.5 SHARP (NEEDLE) ×2
NEEDLE SCORPION MULTI FIRE (NEEDLE) IMPLANT
PACK SHDR ARTHRO (MISCELLANEOUS) ×2 IMPLANT
PAD WRAPON POLAR SHDR XLG (MISCELLANEOUS) ×1 IMPLANT
PENCIL SMOKE ULTRAEVAC 22 CON (MISCELLANEOUS) ×1 IMPLANT
SET TUBE SUCT SHAVER OUTFL 24K (TUBING) ×2 IMPLANT
SET TUBE TIP INTRA-ARTICULAR (MISCELLANEOUS) ×2 IMPLANT
SLING ULTRA II M (MISCELLANEOUS) ×1 IMPLANT
SPONGE GAUZE 2X2 8PLY STRL LF (GAUZE/BANDAGES/DRESSINGS) ×4 IMPLANT
STAPLER SKIN PROX 35W (STAPLE) IMPLANT
STRAP SAFETY 5IN WIDE (MISCELLANEOUS) ×2 IMPLANT
SUT ETHILON 3-0 (SUTURE) ×2 IMPLANT
SUT LASSO 90 DEG CVD (SUTURE) IMPLANT
SUT LASSO 90 DEG SD STR (SUTURE) IMPLANT
SUT MNCRL 4-0 (SUTURE)
SUT MNCRL 4-0 27XMFL (SUTURE)
SUT PROLENE 0 CT 2 (SUTURE) IMPLANT
SUT VIC AB 0 CT1 36 (SUTURE) IMPLANT
SUT VIC AB 2-0 CT2 27 (SUTURE) IMPLANT
SUTURE MNCRL 4-0 27XMF (SUTURE) IMPLANT
TAPE CLOTH 3X10 WHT NS LF (GAUZE/BANDAGES/DRESSINGS) ×2 IMPLANT
TAPE MICROFOAM 4IN (TAPE) ×2 IMPLANT
TUBING ARTHRO INFLOW-ONLY STRL (TUBING) ×2 IMPLANT
TUBING CONNECTING 10 (TUBING) IMPLANT
WAND WEREWOLF FLOW 90D (MISCELLANEOUS) ×2 IMPLANT
WRAPON POLAR PAD SHDR XLG (MISCELLANEOUS) ×2

## 2019-08-28 NOTE — Op Note (Signed)
OPERATIVE NOTE SURGERY DATE: 08/28/2019  PRE-OP DIAGNOSIS: 1. Right shoulder postoperative adhesive capsulitis  2. Right shoulder subacromial bursitis    POST-OP DIAGNOSIS:  1. Right shoulder postoperative adhesive capsulitis  2. Right shoulder subacromial bursitis  PROCEDURES: 1. Right shoulder capsular releases, lysis of adhesions, manipulation under anesthesia  2. Right shoulder extensive glenohumeral debridement 3. Right glenohumeral and subacromial injections with corticosteroid  SURGEON:  Cato Mulligan, MD  ASSISTANT(S):  none  ANESTHESIA: Regional block with Exparil, Gen  TOTAL IV FLUIDS: per anesthesia record   ESTIMATED BLOOD LOSS: Minimal  DRAINS:  None.  SPECIMENS: None  IMPLANTS: None.  COMPLICATIONS: none  INDICATIONS: Danielle Stephens is a 69 y.o. female with complaints of left shoulder pain and stiffness after undergoing right mini open rotator cuff repair, biceps tenodesis, Subacromial decompression, and distal clavicle excision on 03/06/2019 by me.  She had significant difficulty achieving range of motion postoperatively. Preoperative left shoulder examination was notable for severe motion loss and pain. The patient has failed two rounds of corticosteroid injections and extensive physical therapy exercises.  Surgery was recommended for capsular releases, manipulation under anesthesia, and subacromial decompression/bursectomy with corticosteroid injections into glenohumeral joint and subacromial space. After discussion of risks, benefits, and alternatives to surgery, the patient elected to proceed.    OPERATIVE FINDINGS:  Operative Shoulder Range of Motion:  Preop  Postop  Flexion  90 150  Abduction  50 120  ER at 0  10 50  ER at 90  20 90  IR at 90  20 50  IR posterior  L4 T6    Intra-operative findings: A thorough arthroscopic examination of the shoulder was performed.  The findings are: 1. Biceps tendon:  Not visualized 2. Superior labrum: injected  with surrounding synovitis 3. Posterior labrum and capsule: Significant synovitis 4. Inferior capsule and inferior recess: Significant synovitis and thickening of capsule 5. Glenoid cartilage surface: Focal areas of grade 2-3 changes 6. Supraspinatus attachment: Normal 7. Posterior rotator cuff attachment:  Normal 8. Humeral head articular cartilage: Focal areas of grade 2-3 changes 9. Rotator interval: significant synovitis and thickening of capsule 10: Subscapularis tendon:  Normal with significant adhesions 11. Anterior labrum: Degenerative appearing with significant synovitis 12. IGHL: significant synovitis around IGHL   DETAILS OF PROCEDURE: The patient was identified in the preoperative holding area. Informed consent was obtained. Operative extremity was marked. After satisfactory upper extremity regional block with Exparil was performed in the preoperative holding area, the patient was brought to the operating room and placed in a well-padded beach chair positioner.  Eyes were protected, head was affixed in neutral, and the patient was given preoperative IV antibiotics within 30 minutes of the start of the case and a surgical time-out occurred. The upper extremity was prescrubbed with Hibiclens and alcohol, prepped with ChloraPrep and draped in the usual sterile fashion.    I then created a standard posterior portal with an 11 blade. The glenohumeral joint was easily entered with a blunt trochar and the arthroscope introduced. The findings of diagnostic arthroscopy are described above.  A standard anterior portal was made.  The joint was remarkable for moderate synovitis which was chronic in the anterior, superior, posterior, and inferior aspects. This required synovectomy with a shaver and Arthrocare device in the affected compartments listed above.  A combination of electrocautery and oscillating shaver was used to debride the rotator interval tissue.  The posterior aspect of the coracoid  was exposed.  The anterior and posterior aspects of the  subscapularis were cleared of tissue so there was no tethering. Next, an upbiting duckbill basket was then used to perform a capsulotomy of the rotator interval and then the MGHL and the IGHL (anterior band).  Care was taken to protect the intraarticular subscapularis.  Adhesions were cleared off the subscapularis to allow full internal and external rotation.    The arthroscope was placed into the anterior portal.  The posterior capsule was inflamed as well.  After synovectomy, the duckbill basket was used to perform release from the superior glenoid, down into the axillary pouch, around to the anterior band of the IGHL.  A complete 360 capsulotomy was performed in this manner.  Care was taken to protect the axillary nerve by staying on the glenoid side and making sure not to rotate the shoulder externally during the capsulotomy.  Hemostasis was achieved with the ArthroCare wand.  There was no unusual bleeding.  The instruments were removed from the joint.    The arthroscope was placed in the subacromial space. An accessory lateral portal was established. There was severe scar and chronic bursitis filling the whole subacromial space and gutters.  A complete subacromial bursectomy and debridement of the gutters was carried out with a shaver.  ArthroCare was used to control bleeding.  Rotator cuff was noted to be grossly intact.  Manipulation under anesthesia was carried out in a gentle, controlled manner with short lever arms.  There was palpable release of adhesions. See above chart for post-manipulation improvement in range of motion.   The skin was closed with interrupted 3-0 nylon sutures. Injections of 40 mg Kenalog with 1% lidocaine and 0.5% ropivacaine were placed separately in the glenohumeral joint through an anterior approach and subacromial space through posterior approach with a spinal needle.  Xeroform gauze, sterile dressings were  applied. The patient was placed in a shoulder sling.  Polar Care was applied.    Instrument, sponge, and needle counts were correct prior to closure and at the conclusion of the case.   DISPOSITION: PACU - hemodynamically stable.  POSTOPERATIVE PLAN: The patient will be discharged home. PT to begin 1 day postop for range of motion exercises.  ASA x 2 weeks for DVT ppx. Sling only for comfort and wean this week as soon as tolerated. RTC in ~1 week.

## 2019-08-28 NOTE — Anesthesia Preprocedure Evaluation (Addendum)
Anesthesia Evaluation  Patient identified by MRN, date of birth, ID band Patient awake    Reviewed: Allergy & Precautions, H&P , NPO status , Patient's Chart, lab work & pertinent test results  Airway Mallampati: II  TM Distance: <3 FB    Comment: Small face, small mouth Dental  (+) Teeth Intact, Partial Lower   Pulmonary neg pulmonary ROS,    breath sounds clear to auscultation       Cardiovascular hypertension, (-) angina(-) Past MI (-) dysrhythmias  Rhythm:regular Rate:Normal     Neuro/Psych PSYCHIATRIC DISORDERS Anxiety negative neurological ROS  negative psych ROS   GI/Hepatic negative GI ROS, Neg liver ROS,   Endo/Other  negative endocrine ROS  Renal/GU Renal disease (CRI)     Musculoskeletal   Abdominal   Peds  Hematology negative hematology ROS (+)   Anesthesia Other Findings Past Medical History: No date: Anxiety No date: Hypertension No date: Shoulder pain  Past Surgical History: No date: DILATION AND CURETTAGE, DIAGNOSTIC / THERAPEUTIC 03/06/2019: rotary cuff; Right     Reproductive/Obstetrics negative OB ROS                          Anesthesia Physical Anesthesia Plan  ASA: II  Anesthesia Plan: General ETT   Post-op Pain Management: GA combined w/ Regional for post-op pain   Induction:   PONV Risk Score and Plan: Ondansetron, Dexamethasone, Treatment may vary due to age or medical condition and Midazolam  Airway Management Planned:   Additional Equipment:   Intra-op Plan:   Post-operative Plan:   Informed Consent: I have reviewed the patients History and Physical, chart, labs and discussed the procedure including the risks, benefits and alternatives for the proposed anesthesia with the patient or authorized representative who has indicated his/her understanding and acceptance.     Dental Advisory Given  Plan Discussed with: Anesthesiologist, CRNA and  Surgeon  Anesthesia Plan Comments:        Anesthesia Quick Evaluation

## 2019-08-28 NOTE — H&P (Signed)
Paper H&P to be scanned into permanent record. H&P reviewed. No significant changes noted.  

## 2019-08-28 NOTE — Anesthesia Procedure Notes (Signed)
Anesthesia Regional Block: Interscalene brachial plexus block   Pre-Anesthetic Checklist: ,, timeout performed, Correct Patient, Correct Site, Correct Laterality, Correct Procedure, Correct Position, site marked, Risks and benefits discussed,  Surgical consent,  Pre-op evaluation,  At surgeon's request and post-op pain management  Laterality: Right  Prep: chloraprep       Needles:  Injection technique: Single-shot  Needle Type: Stimiplex     Needle Length: 9cm  Needle Gauge: 21     Additional Needles:   Procedures:,,,, ultrasound used (permanent image in chart),,,,  Narrative:  Start time: 08/28/2019 11:55 AM End time: 08/28/2019 11:59 AM  Performed by: Personally  Anesthesiologist: Tera Mater, MD  Additional Notes: Risks and benefits of nerve block discussed with patient, including but not limited to risk of nerve injury, bleeding, infection, and failed block.  Patient expressed understanding and consented to block placement.   Functioning IV was confirmed and monitors were applied.  Sterile prep,hand hygiene and sterile gloves were used.  Minimal sedation used for procedure.  During the procedure, there was negative aspiration, negative paresthesia on injection, and dose was given in divided aliquots under ultrasound guidance.  Patient tolerated the procedure well with no immediate complications.

## 2019-08-28 NOTE — Discharge Instructions (Signed)
Interscalene Nerve Block with Exparel  1.  For your surgery you have received an Interscalene Nerve Block with Exparel. 2. Nerve Blocks affect many types of nerves, including nerves that control movement, pain and normal sensation.  You may experience feelings such as numbness, tingling, heaviness, weakness or the inability to move your arm or the feeling or sensation that your arm has "fallen asleep". 3. A nerve block with Exparel can last up to 5 days.  Usually the weakness wears off first.  The tingling and heaviness usually wear off next.  Finally you may start to notice pain.  Keep in mind that this may occur in any order.  Once a nerve block starts to wear off it is usually completely gone within 60 minutes. 4. ISNB may cause mild shortness of breath, a hoarse voice, blurry vision, unequal pupils, or drooping of the face on the same side as the nerve block.  These symptoms will usually resolve with the numbness.  Very rarely the procedure itself can cause mild seizures. 5. If needed, your surgeon will give you a prescription for pain medication.  It will take about 60 minutes for the oral pain medication to become fully effective.  So, it is recommended that you start taking this medication before the nerve block first begins to wear off, or when you first begin to feel discomfort. 6. Take your pain medication only as prescribed.  Pain medication can cause sedation and decrease your breathing if you take more than you need for the level of pain that you have. 7. Nausea is a common side effect of many pain medications.  You may want to eat something before taking your pain medicine to prevent nausea. 8. After an Interscalene nerve block, you cannot feel pain, pressure or extremes in temperature in the effected arm.  Because your arm is numb it is at an increased risk for injury.  To decrease the possibility of injury, please practice the following:  a. While you are awake change the position of  your arm frequently to prevent too much pressure on any one area for prolonged periods of time. b.  If you have a cast or tight dressing, check the color or your fingers every couple of hours.  Call your surgeon with the appearance of any discoloration (white or blue). c. If you are given a sling to wear before you go home, please wear it  at all times until the block has completely worn off.  Do not get up at night without your sling. d. Please contact [facility] or your surgeon if you do not begin to regain sensation after 7 days from the surgery.  Anesthesia may be contacted by calling the Same Day Surgery Department, Mon. through Fri., 6 am to 4 pm at [phone].   e. If you experience any other problems or concerns, please contact your surgeon's office. f. If you experience severe or prolonged shortness of breath go to the nearest emergency department.   AMBULATORY SURGERY  DISCHARGE INSTRUCTIONS   1) The drugs that you were given will stay in your system until tomorrow so for the next 24 hours you should not:  A) Drive an automobile B) Make any legal decisions C) Drink any alcoholic beverage   2) You may resume regular meals tomorrow.  Today it is better to start with liquids and gradually work up to solid foods.  You may eat anything you prefer, but it is better to start with liquids, then soup   and crackers, and gradually work up to solid foods.   3) Please notify your doctor immediately if you have any unusual bleeding, trouble breathing, redness and pain at the surgery site, drainage, fever, or pain not relieved by medication.    4) Additional Instructions:        Please contact your physician with any problems or Same Day Surgery at [phone], Monday through Friday 6 am to 4 pm, or [place] [facility] number at [phone].Post-Op Instructions - Shoulder Capsular Release/Manipulation Under Anesthesia  1. Bracing: You should wear a sling for comfort  only. Sling should NOT be worn longer than ~1 week.   2. Driving: No driving for 1 week post-op. Must be off narcotic pain medication.  3. Activity: No active lifting for ~2 weeks. Perform range of motion exercises DAILY at home and with physical therapy as prescribed.   4. Physical Therapy: This NEEDS to begin the day after surgery, and proceed ~6-12 weeks. This should be at least 3x/week.   5. Medications:  - You will be provided a prescription for narcotic pain medicine. After surgery, take 1-2 narcotic tablets every 4 hours if needed for severe pain.  - A prescription for anti-nausea medication will be provided in case the narcotic medicine causes nausea - take 1 tablet every 6 hours only if nauseated.   - Take tylenol 1000 mg (2 Extra Strength tablets or 3 regular strength) every 8 hours for pain.  May decrease or stop tylenol 5 days after surgery if you are having minimal pain. - Take ibuprofen 800mg  three times/day with food for at least two weeks every day. This will help reduce post-operative inflammation and swelling. Please call our offices if this causes any stomach/GI irritation.  - Take Aspirin 325mg /daily x 2 weeks to help prevent DVT/PE (Blood clots)   If you are taking prescription medication for anxiety, depression, insomnia, muscle spasm, chronic pain, or for attention deficit disorder, you are advised that you are at a higher risk of adverse effects with use of narcotics post-op, including narcotic addiction/dependence, depressed breathing, death. If you use non-prescribed substances: alcohol, marijuana, cocaine, heroin, methamphetamines, etc., you are at a higher risk of adverse effects with use of narcotics post-op, including narcotic addiction/dependence, depressed breathing, death. You are advised that taking > 50 morphine milligram equivalents (MME) of narcotic pain medication per day results in twice the risk of overdose or death. For your prescription provided: oxycodone  5 mg - taking more than 6 tablets per day would result in > 50 morphine milligram equivalents (MME) of narcotic pain medication. Be advised that we will prescribe narcotics short-term, for acute post-operative pain only - 3 weeks for major operations such as shoulder repair/reconstruction surgeries.    6. Post-Op Appointment:  Your first post-op appointment will be ~1 week post-op.  7. Work or School: For most, but not all procedures, we advise staying out of work or school for at least 1 to 2 weeks in order to recover from the stress of surgery and to allow time for healing.   If you need a work or school note this can be provided.

## 2019-08-28 NOTE — Anesthesia Procedure Notes (Signed)
Procedure Name: Intubation Date/Time: 08/28/2019 12:19 PM Performed by: Gentry Fitz, CRNA Pre-anesthesia Checklist: Patient identified, Emergency Drugs available, Suction available and Patient being monitored Patient Re-evaluated:Patient Re-evaluated prior to induction Oxygen Delivery Method: Circle system utilized Preoxygenation: Pre-oxygenation with 100% oxygen Induction Type: IV induction Laryngoscope Size: McGraph and 3 Grade View: Grade I Tube type: Oral Tube size: 6.5 mm Number of attempts: 1 Airway Equipment and Method: Stylet Placement Confirmation: ETT inserted through vocal cords under direct vision,  positive ETCO2 and breath sounds checked- equal and bilateral Secured at: 18 cm Dental Injury: Teeth and Oropharynx as per pre-operative assessment

## 2019-08-28 NOTE — Transfer of Care (Signed)
Immediate Anesthesia Transfer of Care Note  Patient: Elke A Schertzer  Procedure(s) Performed: Right shoulder arthroscopic capsular release, lysis of adhesion, (Right Shoulder) MANIPULATION SHOULDER WITH STEROID INJECTION x2 (Right Shoulder)  Patient Location: PACU  Anesthesia Type:General  Level of Consciousness: awake, drowsy and patient cooperative  Airway & Oxygen Therapy: Patient Spontanous Breathing and Patient connected to face mask oxygen  Post-op Assessment: Report given to RN and Post -op Vital signs reviewed and stable  Post vital signs: Reviewed and stable  Last Vitals:  Vitals Value Taken Time  BP 155/74 08/28/19 1456  Temp 36.2 C 08/28/19 1456  Pulse 82 08/28/19 1457  Resp 13 08/28/19 1457  SpO2 100 % 08/28/19 1457  Vitals shown include unvalidated device data.  Last Pain:  Vitals:   08/28/19 1456  TempSrc:   PainSc: 0-No pain         Complications: No complications documented.

## 2019-08-29 ENCOUNTER — Encounter: Payer: Self-pay | Admitting: Orthopedic Surgery

## 2019-08-29 NOTE — Anesthesia Postprocedure Evaluation (Signed)
Anesthesia Post Note  Patient: Danielle Stephens  Procedure(s) Performed: Right shoulder arthroscopic capsular release, lysis of adhesion, (Right Shoulder) MANIPULATION SHOULDER WITH STEROID INJECTION x2 (Right Shoulder)  Patient location during evaluation: PACU Anesthesia Type: Regional Level of consciousness: awake and alert and oriented Pain management: pain level controlled Vital Signs Assessment: post-procedure vital signs reviewed and stable Respiratory status: spontaneous breathing Cardiovascular status: blood pressure returned to baseline Anesthetic complications: no   No complications documented.   Last Vitals:  Vitals:   08/28/19 1603 08/28/19 1628  BP: (!) 162/76 (!) 162/80  Pulse: 82 75  Resp: 18 18  Temp: (!) 36.4 C   SpO2: 99% 98%    Last Pain:  Vitals:   08/29/19 0957  TempSrc:   PainSc: 0-No pain                 Anjolina Byrer

## 2019-09-06 ENCOUNTER — Encounter: Payer: Medicare HMO | Admitting: Nurse Practitioner

## 2019-09-21 ENCOUNTER — Other Ambulatory Visit: Payer: Self-pay

## 2019-09-21 ENCOUNTER — Encounter: Payer: Self-pay | Admitting: Nurse Practitioner

## 2019-09-21 ENCOUNTER — Ambulatory Visit (INDEPENDENT_AMBULATORY_CARE_PROVIDER_SITE_OTHER): Payer: Medicare HMO | Admitting: Nurse Practitioner

## 2019-09-21 VITALS — BP 128/70 | HR 91 | Temp 97.9°F | Wt 100.6 lb

## 2019-09-21 DIAGNOSIS — M79672 Pain in left foot: Secondary | ICD-10-CM | POA: Insufficient documentation

## 2019-09-21 NOTE — Progress Notes (Signed)
BP 128/70 (BP Location: Left Arm)    Pulse 91    Temp 97.9 F (36.6 C) (Oral)    Wt 100 lb 9.6 oz (45.6 kg)    SpO2 99%    BMI 19.01 kg/m    Subjective:    Patient ID: Danielle Stephens, female    DOB: Apr 21, 1950, 69 y.o.   MRN: [id number]  HPI: Danielle Stephens is a 69 y.o. female  Chief Complaint  Patient presents with   Foot Swelling    left   FOOT SWELLING: Sunday morning she woke up and foot was swollen, there was pain to foot, with burning on the inside.  Has some tenderness to left inner ankle and burns into top of foot.  No recent falls or injuries, no recent rashes or bug bites.  Has been going to PT three days after recent shoulder surgery on June 14th.  Taking Ibuprofen and Tylenol at home post op -- taking Ibuprofen once to twice a day which she reports does improve foot pain. Duration: days Severity: 5/10 Quality: dull and aching Frequency: intermittent Aggravating factors: standing for a long period Alleviating factors: Ibuprofen and Tylenol Status: fluctuating Treatments attempted: APAP and ibuprofen  Relief with NSAIDs?: no Nighttime pain:  no Fevers:  no  Relevant past medical, surgical, family and social history reviewed and updated as indicated. Interim medical history since our last visit reviewed. Allergies and medications reviewed and updated.  Review of Systems  Constitutional: Negative for activity change, appetite change, diaphoresis, fatigue and fever.  Respiratory: Negative for cough, chest tightness, shortness of breath and wheezing.   Cardiovascular: Positive for leg swelling (foot). Negative for chest pain and palpitations.  Gastrointestinal: Negative.   Endocrine: Negative.   Neurological: Negative.   Psychiatric/Behavioral: Negative.     Per HPI unless specifically indicated above     Objective:    BP 128/70 (BP Location: Left Arm)    Pulse 91    Temp 97.9 F (36.6 C) (Oral)    Wt 100 lb 9.6 oz (45.6 kg)    SpO2 99%    BMI 19.01 kg/m   Wt  Readings from Last 3 Encounters:  09/21/19 100 lb 9.6 oz (45.6 kg)  08/28/19 103 lb 9.9 oz (47 kg)  08/22/19 103 lb 6.3 oz (46.9 kg)    Physical Exam Vitals and nursing note reviewed.  Constitutional:      General: She is awake. She is not in acute distress.    Appearance: She is well-developed and well-groomed. She is not ill-appearing.  HENT:     Head: Normocephalic.     Right Ear: Hearing normal.     Left Ear: Hearing normal.  Eyes:     General: Lids are normal.        Right eye: No discharge.        Left eye: No discharge.     Conjunctiva/sclera: Conjunctivae normal.     Pupils: Pupils are equal, round, and reactive to light.  Neck:     Vascular: No carotid bruit.  Cardiovascular:     Rate and Rhythm: Normal rate and regular rhythm.     Pulses:          Popliteal pulses are 2+ on the right side and 2+ on the left side.       Dorsalis pedis pulses are 2+ on the right side and 2+ on the left side.       Posterior tibial pulses are 2+ on the right  side and 2+ on the left side.     Heart sounds: Normal heart sounds. No murmur heard.  No gallop.      Comments: Some small spider veins noted to bilateral lower legs. Very scant, non pitting edema noted to left foot today compared to right.  No warmth, edema.  Negative Homans bilaterally.   Pulmonary:     Effort: Pulmonary effort is normal. No accessory muscle usage or respiratory distress.     Breath sounds: Normal breath sounds.  Abdominal:     General: Bowel sounds are normal.     Palpations: Abdomen is soft.  Musculoskeletal:     Cervical back: Normal range of motion and neck supple.     Right foot: Normal range of motion. No deformity.     Left foot: Normal range of motion. No deformity.       Feet:  Feet:     Right foot:     Protective Sensation: 10 sites tested. 10 sites sensed.     Skin integrity: Skin integrity normal.     Toenail Condition: Right toenails are normal.     Left foot:     Protective Sensation: 10  sites tested. 10 sites sensed.     Skin integrity: Skin integrity normal.     Toenail Condition: Left toenails are normal.  Skin:    General: Skin is warm and dry.  Neurological:     Mental Status: She is alert and oriented to person, place, and time.  Psychiatric:        Attention and Perception: Attention normal.        Mood and Affect: Mood normal.        Speech: Speech normal.        Behavior: Behavior normal. Behavior is cooperative.        Thought Content: Thought content normal.     Results for orders placed or performed during the hospital encounter of 08/24/19  SARS CORONAVIRUS 2 (TAT 6-24 HRS) Nasopharyngeal Nasopharyngeal Swab   Specimen: Nasopharyngeal Swab  Result Value Ref Range   SARS Coronavirus 2 NEGATIVE NEGATIVE      Assessment & Plan:   Problem List Items Addressed This Visit      Other   Left foot pain - Primary    Acute improves with Ibuprofen at home. Very low suspicion for DVT or fracture.  Will obtain imaging to further assess foot and ankle.  Labs today uric acid, D dimer, CMP, CBC.  Suspect more tendonitis.  Recommend continue Tylenol and Ibuprofen at home as needed. May alternate heat and ice for comfort.  Plan to return to office for worsening or ongoing symptoms OR if abnormal labs noted.        Relevant Orders   CBC with Differential/Platelet   Comprehensive metabolic panel   Uric acid   D-Dimer, Quantitative   DG Foot Complete Left   DG Ankle Complete Left       Follow up plan: Return if symptoms worsen or fail to improve.

## 2019-09-21 NOTE — Assessment & Plan Note (Signed)
Acute improves with Ibuprofen at home. Very low suspicion for DVT or fracture.  Will obtain imaging to further assess foot and ankle.  Labs today uric acid, D dimer, CMP, CBC.  Suspect more tendonitis.  Recommend continue Tylenol and Ibuprofen at home as needed. May alternate heat and ice for comfort.  Plan to return to office for worsening or ongoing symptoms OR if abnormal labs noted.

## 2019-09-21 NOTE — Patient Instructions (Signed)
IMAGING LOCATION --- [place], [place] [address]  Foot Pain Many things can cause foot pain. Some common causes are:  An injury.  A sprain.  Arthritis.  Blisters.  Bunions. Follow these instructions at home: Managing pain, stiffness, and swelling If directed, put ice on the painful area:  Put ice in a plastic bag.  Place a towel between your skin and the bag.  Leave the ice on for 20 minutes, 2-3 times a day.  Activity  Do not stand or walk for long periods.  Return to your normal activities as told by your health care provider. Ask your health care provider what activities are safe for you.  Do stretches to relieve foot pain and stiffness as told by your health care provider.  Do not lift anything that is heavier than 10 lb (4.5 kg), or the limit that you are told, until your health care provider says that it is safe. Lifting a lot of weight can put added pressure on your feet. Lifestyle  Wear comfortable, supportive shoes that fit you well. Do not wear high heels.  Keep your feet clean and dry. General instructions  Take over-the-counter and prescription medicines only as told by your health care provider.  Rub your foot gently.  Pay attention to any changes in your symptoms.  Keep all follow-up visits as told by your health care provider. This is important. Contact a health care provider if:  Your pain does not get better after a few days of self-care.  Your pain gets worse.  You cannot stand on your foot. Get help right away if:  Your foot is numb or tingling.  Your foot or toes are swollen.  Your foot or toes turn white or blue.  You have warmth and redness along your foot. Summary  Common causes of foot pain are injury, sprain, arthritis, blisters or bunions.  Ice, medicines, and comfortable shoes may help foot pain.  Contact your health care provider if your pain does not get better after a few days of self-care. This information is  not intended to replace advice given to you by your health care provider. Make sure you discuss any questions you have with your health care provider. Document Revised: 12/16/2017 Document Reviewed: 12/16/2017 Elsevier Patient Education  [place].

## 2019-09-22 ENCOUNTER — Telehealth: Payer: Self-pay | Admitting: Nurse Practitioner

## 2019-09-22 ENCOUNTER — Ambulatory Visit
Admission: RE | Admit: 2019-09-22 | Discharge: 2019-09-22 | Disposition: A | Discharge status: Home / Self Care | Payer: Medicare HMO | Source: Ambulatory Visit | Attending: Nurse Practitioner | Admitting: Nurse Practitioner

## 2019-09-22 DIAGNOSIS — M79672 Pain in left foot: Secondary | ICD-10-CM

## 2019-09-22 LAB — COMPREHENSIVE METABOLIC PANEL
ALT: 12 IU/L (ref 0–32)
AST: 19 IU/L (ref 0–40)
Albumin/Globulin Ratio: 1.9 (ref 1.2–2.2)
Albumin: 4.5 g/dL (ref 3.8–4.8)
Alkaline Phosphatase: 78 IU/L (ref 48–121)
BUN/Creatinine Ratio: 14 (ref 12–28)
BUN: 13 mg/dL (ref 8–27)
Bilirubin Total: 0.4 mg/dL (ref 0.0–1.2)
CO2: 26 mmol/L (ref 20–29)
Calcium: 9.6 mg/dL (ref 8.7–10.3)
Chloride: 103 mmol/L (ref 96–106)
Creatinine, Ser: 0.94 mg/dL (ref 0.57–1.00)
GFR calc Af Amer: 73 mL/min/{1.73_m2} (ref >59)
GFR calc non Af Amer: 63 mL/min/{1.73_m2} (ref >59)
Globulin, Total: 2.4 g/dL (ref 1.5–4.5)
Glucose: 91 mg/dL (ref 65–99)
Potassium: 4.4 mmol/L (ref 3.5–5.2)
Sodium: 142 mmol/L (ref 134–144)
Total Protein: 6.9 g/dL (ref 6.0–8.5)

## 2019-09-22 LAB — CBC WITH DIFFERENTIAL/PLATELET
Basophils Absolute: 0 10*3/uL (ref 0.0–0.2)
Basos: 1 %
EOS (ABSOLUTE): 0.2 10*3/uL (ref 0.0–0.4)
Eos: 5 %
Hematocrit: 38.5 % (ref 34.0–46.6)
Hemoglobin: 12.4 g/dL (ref 11.1–15.9)
Immature Grans (Abs): 0 10*3/uL (ref 0.0–0.1)
Immature Granulocytes: 0 %
Lymphocytes Absolute: 1.3 10*3/uL (ref 0.7–3.1)
Lymphs: 31 %
MCH: 27.5 pg (ref 26.6–33.0)
MCHC: 32.2 g/dL (ref 31.5–35.7)
MCV: 85 fL (ref 79–97)
Monocytes Absolute: 0.4 10*3/uL (ref 0.1–0.9)
Monocytes: 10 %
Neutrophils Absolute: 2.3 10*3/uL (ref 1.4–7.0)
Neutrophils: 53 %
Platelets: 259 10*3/uL (ref 150–450)
RBC: 4.51 x10E6/uL (ref 3.77–5.28)
RDW: 12.5 % (ref 11.7–15.4)
WBC: 4.3 10*3/uL (ref 3.4–10.8)

## 2019-09-22 LAB — URIC ACID: Uric Acid: 5 mg/dL (ref 3.0–7.2)

## 2019-09-22 LAB — D-DIMER, QUANTITATIVE: D-DIMER: 2.32 mg/L FEU — ABNORMAL HIGH (ref 0.00–0.49)

## 2019-09-22 NOTE — Telephone Encounter (Signed)
Patient notified

## 2019-09-22 NOTE — Progress Notes (Signed)
Please let Danielle Stephens know her ultrasound was good, no DVT to that left leg.  We can continue forward with our current plan of care, no new medications needed.  If pain worsens let me know.  Have a great day!!

## 2019-09-22 NOTE — Telephone Encounter (Signed)
She should wait until after imaging, it is okay to miss PT today -- imaging takes priority.:)

## 2019-09-22 NOTE — Telephone Encounter (Signed)
Routing to provider to advise.  

## 2019-09-22 NOTE — Addendum Note (Signed)
Addended by: Marnee Guarneri T on: 09/22/2019 08:09 AM   Modules accepted: Orders

## 2019-09-22 NOTE — Telephone Encounter (Signed)
Spoke to patient on telephone this morning and reviewed labs, all within normal limits with exception of D Dimer being mildly elevated == due to recent complaint edema and left lower leg discomfort will obtain U/S to further assess for DVT, STAT today.  She is aware and order in place.

## 2019-09-22 NOTE — Telephone Encounter (Signed)
Copied from [place] [id number]. Topic: General - Other >> Sep 22, 2019  8:26 AM Alanda Slim E wrote: Reason for CRM: Pt was advised she will be called today to go to the hospital for an ultra sound/ she has PT for her shoulder today at 11:30 and wants to ask Jolene if she should go to her PT appt or wait / please advise

## 2019-09-22 NOTE — Progress Notes (Signed)
Refer to telephone note 09/22/19.

## 2019-10-02 ENCOUNTER — Telehealth: Payer: Self-pay | Admitting: Nurse Practitioner

## 2019-10-02 NOTE — Telephone Encounter (Signed)
Patient was in to see Danielle Stephens on 09/21/19. Patient reports she is still having pain in her Left leg. It is radiating up her legs and veins are hurting. Patient reports that her back is not hurting. She has to put a pillow under leg to sleep.P Patient reports it is getting worse. And she is only taking Xtra strength Tylnelnol. The patient would like to know Jolelne want refer the patient somewhere? Patient is will traveling in September and does not want to be in pain or unable to walk due to pain. Please advise CB with the patient Cb- [phone]

## 2019-10-02 NOTE — Telephone Encounter (Signed)
Please see if we can get her in this week or other recommendation would be [facility] walk in clinic since she is traveling soon.

## 2019-10-03 NOTE — Telephone Encounter (Signed)
Noted, will follow-up with her after [facility] visit.

## 2019-10-03 NOTE — Telephone Encounter (Signed)
Pt stated that she would go to [facility] today for evaluation and follow up.

## 2019-10-09 ENCOUNTER — Telehealth: Payer: Self-pay | Admitting: Nurse Practitioner

## 2019-10-09 NOTE — Telephone Encounter (Signed)
Copied from [place] [id number]. Topic: Medicare AWV >> Oct 09, 2019 10:08 AM Cher Nakai R wrote: Reason for CRM:  Left message for patient to call back and schedule Medicare Annual Wellness Visit (AWV) to be done virtually.  No hx of AWV  Please schedule at anytime with [facility] Advisor.      38 Minutes appointment

## 2019-10-16 ENCOUNTER — Ambulatory Visit: Payer: Medicare HMO

## 2019-10-27 ENCOUNTER — Ambulatory Visit (INDEPENDENT_AMBULATORY_CARE_PROVIDER_SITE_OTHER): Payer: Medicare HMO

## 2019-10-27 VITALS — Ht 62.0 in | Wt 103.0 lb

## 2019-10-27 DIAGNOSIS — Z0000 Encounter for general adult medical examination without abnormal findings: Secondary | ICD-10-CM

## 2019-10-27 NOTE — Patient Instructions (Signed)
Danielle Stephens , Thank you for taking time to come for your Medicare Wellness Visit. I appreciate your ongoing commitment to your health goals. Please review the following plan we discussed and let me know if I can assist you in the future.   Screening recommendations/referrals: Colonoscopy: due Mammogram: due Bone Density: due Recommended yearly ophthalmology/optometry visit for glaucoma screening and checkup Recommended yearly dental visit for hygiene and checkup  Vaccinations: Influenza vaccine: due Pneumococcal vaccine: due Tdap vaccine: due Shingles vaccine: discussed   Covid-19: 06/07/2019, 05/12/2019  Advanced directives: Advance directive discussed with you today.    Conditions/risks identified: none  Next appointment: Follow up in one year for your annual wellness visit    Preventive Care 65 Years and Older, Female Preventive care refers to lifestyle choices and visits with your health care provider that can promote health and wellness. What does preventive care include?  A yearly physical exam. This is also called an annual well check.  Dental exams once or twice a year.  Routine eye exams. Ask your health care provider how often you should have your eyes checked.  Personal lifestyle choices, including:  Daily care of your teeth and gums.  Regular physical activity.  Eating a healthy diet.  Avoiding tobacco and drug use.  Limiting alcohol use.  Practicing safe sex.  Taking low-dose aspirin every day.  Taking vitamin and mineral supplements as recommended by your health care provider. What happens during an annual well check? The services and screenings done by your health care provider during your annual well check will depend on your age, overall health, lifestyle risk factors, and family history of disease. Counseling  Your health care provider may ask you questions about your:  Alcohol use.  Tobacco use.  Drug use.  Emotional well-being.  Home and  relationship well-being.  Sexual activity.  Eating habits.  History of falls.  Memory and ability to understand (cognition).  Work and work Statistician.  Reproductive health. Screening  You may have the following tests or measurements:  Height, weight, and BMI.  Blood pressure.  Lipid and cholesterol levels. These may be checked every 5 years, or more frequently if you are over 51 years old.  Skin check.  Lung cancer screening. You may have this screening every year starting at age 63 if you have a 30-pack-year history of smoking and currently smoke or have quit within the past 15 years.  Fecal occult blood test (FOBT) of the stool. You may have this test every year starting at age 51.  Flexible sigmoidoscopy or colonoscopy. You may have a sigmoidoscopy every 5 years or a colonoscopy every 10 years starting at age 31.  Hepatitis C blood test.  Hepatitis B blood test.  Sexually transmitted disease (STD) testing.  Diabetes screening. This is done by checking your blood sugar (glucose) after you have not eaten for a while (fasting). You may have this done every 1-3 years.  Bone density scan. This is done to screen for osteoporosis. You may have this done starting at age 73.  Mammogram. This may be done every 1-2 years. Talk to your health care provider about how often you should have regular mammograms. Talk with your health care provider about your test results, treatment options, and if necessary, the need for more tests. Vaccines  Your health care provider may recommend certain vaccines, such as:  Influenza vaccine. This is recommended every year.  Tetanus, diphtheria, and acellular pertussis (Tdap, Td) vaccine. You may need a Td booster  every 10 years.  Zoster vaccine. You may need this after age 15.  Pneumococcal 13-valent conjugate (PCV13) vaccine. One dose is recommended after age 105.  Pneumococcal polysaccharide (PPSV23) vaccine. One dose is recommended after  age 44. Talk to your health care provider about which screenings and vaccines you need and how often you need them. This information is not intended to replace advice given to you by your health care provider. Make sure you discuss any questions you have with your health care provider. Document Released: 03/29/2015 Document Revised: 11/20/2015 Document Reviewed: 01/01/2015 Elsevier Interactive Patient Education  2017 [place] Prevention in the Home Falls can cause injuries. They can happen to people of all ages. There are many things you can do to make your home safe and to help prevent falls. What can I do on the outside of my home?  Regularly fix the edges of walkways and driveways and fix any cracks.  Remove anything that might make you trip as you walk through a door, such as a raised step or threshold.  Trim any bushes or trees on the path to your home.  Use bright outdoor lighting.  Clear any walking paths of anything that might make someone trip, such as rocks or tools.  Regularly check to see if handrails are loose or broken. Make sure that both sides of any steps have handrails.  Any raised decks and porches should have guardrails on the edges.  Have any leaves, snow, or ice cleared regularly.  Use sand or salt on walking paths during winter.  Clean up any spills in your garage right away. This includes oil or grease spills. What can I do in the bathroom?  Use night lights.  Install grab bars by the toilet and in the tub and shower. Do not use towel bars as grab bars.  Use non-skid mats or decals in the tub or shower.  If you need to sit down in the shower, use a plastic, non-slip stool.  Keep the floor dry. Clean up any water that spills on the floor as soon as it happens.  Remove soap buildup in the tub or shower regularly.  Attach bath mats securely with double-sided non-slip rug tape.  Do not have throw rugs and other things on the floor that can  make you trip. What can I do in the bedroom?  Use night lights.  Make sure that you have a light by your bed that is easy to reach.  Do not use any sheets or blankets that are too big for your bed. They should not hang down onto the floor.  Have a firm chair that has side arms. You can use this for support while you get dressed.  Do not have throw rugs and other things on the floor that can make you trip. What can I do in the kitchen?  Clean up any spills right away.  Avoid walking on wet floors.  Keep items that you use a lot in easy-to-reach places.  If you need to reach something above you, use a strong step stool that has a grab bar.  Keep electrical cords out of the way.  Do not use floor polish or wax that makes floors slippery. If you must use wax, use non-skid floor wax.  Do not have throw rugs and other things on the floor that can make you trip. What can I do with my stairs?  Do not leave any items on the stairs.  Make  sure that there are handrails on both sides of the stairs and use them. Fix handrails that are broken or loose. Make sure that handrails are as long as the stairways.  Check any carpeting to make sure that it is firmly attached to the stairs. Fix any carpet that is loose or worn.  Avoid having throw rugs at the top or bottom of the stairs. If you do have throw rugs, attach them to the floor with carpet tape.  Make sure that you have a light switch at the top of the stairs and the bottom of the stairs. If you do not have them, ask someone to add them for you. What else can I do to help prevent falls?  Wear shoes that:  Do not have high heels.  Have rubber bottoms.  Are comfortable and fit you well.  Are closed at the toe. Do not wear sandals.  If you use a stepladder:  Make sure that it is fully opened. Do not climb a closed stepladder.  Make sure that both sides of the stepladder are locked into place.  Ask someone to hold it for you,  if possible.  Clearly mark and make sure that you can see:  Any grab bars or handrails.  First and last steps.  Where the edge of each step is.  Use tools that help you move around (mobility aids) if they are needed. These include:  Canes.  Walkers.  Scooters.  Crutches.  Turn on the lights when you go into a dark area. Replace any light bulbs as soon as they burn out.  Set up your furniture so you have a clear path. Avoid moving your furniture around.  If any of your floors are uneven, fix them.  If there are any pets around you, be aware of where they are.  Review your medicines with your doctor. Some medicines can make you feel dizzy. This can increase your chance of falling. Ask your doctor what other things that you can do to help prevent falls. This information is not intended to replace advice given to you by your health care provider. Make sure you discuss any questions you have with your health care provider. Document Released: 12/27/2008 Document Revised: 08/08/2015 Document Reviewed: 04/06/2014 Elsevier Interactive Patient Education  2017 [employer].

## 2019-10-27 NOTE — Progress Notes (Signed)
I connected with Danielle Stephens today by telephone and verified that I am speaking with the correct person using two identifiers. Location patient: home Location provider: work Persons participating in the virtual visit: Danielle Stephens, Glenna Durand LPN.   I discussed the limitations, risks, security and privacy concerns of performing an evaluation and management service by telephone and the availability of in person appointments. I also discussed with the patient that there may be a patient responsible charge related to this service. The patient expressed understanding and verbally consented to this telephonic visit.    Interactive audio and video telecommunications were attempted between this provider and patient, however failed, due to patient having technical difficulties OR patient did not have access to video capability.  We continued and completed visit with audio only.    Vital signs may be patient reported or missing.   Subjective:   Danielle Stephens is a 69 y.o. female who presents for Medicare Annual (Subsequent) preventive examination.  Review of Systems     Cardiac Risk Factors include: advanced age (>57men, >50 women);hypertension     Objective:    Today's Vitals   10/27/19 1426 10/27/19 1427  Weight: 103 lb (46.7 kg)   Height: 5\' 2"  (1.575 m)   PainSc:  8    Body mass index is 18.84 kg/m.  Advanced Directives 10/27/2019 03/06/2019 03/01/2019 01/20/2019 04/17/2017  Does Patient Have a Medical Advance Directive? No No No No No  Would patient like information on creating a medical advance directive? - No - Patient declined - - -    Current Medications (verified) Outpatient Encounter Medications as of 10/27/2019  Medication Sig  . acetaminophen (TYLENOL) 500 MG tablet Take 2 tablets (1,000 mg total) by mouth every 8 (eight) hours.  . cholecalciferol (VITAMIN D3) 25 MCG (1000 UNIT) tablet Take 2,000 Units by mouth daily. gummie  . diphenhydrAMINE HCl, Sleep, (SLEEP AID) 25 MG CAPS  Take 1 capsule by mouth.   Marland Kitchen lisinopril (ZESTRIL) 20 MG tablet Take 1 tablet (20 mg total) by mouth daily.  . Multiple Vitamin (MULTIVITAMIN) capsule Take 1 capsule by mouth daily.   No facility-administered encounter medications on file as of 10/27/2019.    Allergies (verified) Codeine and Tramadol   History: Past Medical History:  Diagnosis Date  . Anxiety   . Hypertension   . Shoulder pain    Past Surgical History:  Procedure Laterality Date  . CLOSED MANIPULATION SHOULDER WITH STERIOD INJECTION Right 08/28/2019   Procedure: MANIPULATION SHOULDER WITH STEROID INJECTION x2;  Surgeon: Leim Fabry, MD;  Location: [facility] ORS;  Service: Orthopedics;  Laterality: Right;  . DILATION AND CURETTAGE, DIAGNOSTIC / THERAPEUTIC    . rotary cuff Right 03/06/2019  . SHOULDER ARTHROSCOPY Right 08/28/2019   Procedure: Right shoulder arthroscopic capsular release, lysis of adhesion,;  Surgeon: Leim Fabry, MD;  Location: [facility] ORS;  Service: Orthopedics;  Laterality: Right;   Family History  Problem Relation Age of Onset  . Hypertension Mother   . Cancer Mother   . Hypertension Father   . Heart attack Father   . Stroke Sister   . Hypertension Sister   . Heart attack Brother   . Hypertension Sister   . Hypertension Sister   . Hypertension Sister   . Hypertension Sister   . Hypertension Sister   . Down syndrome Brother   . Breast cancer Neg Hx    Social History   Socioeconomic History  . Marital status: Married    Spouse name: Not on  file  . Number of children: 2  . Years of education: Not on file  . Highest education level: Not on file  Occupational History  . Occupation: retired  Tobacco Use  . Smoking status: Never Smoker  . Smokeless tobacco: Never Used  Vaping Use  . Vaping Use: Never used  Substance and Sexual Activity  . Alcohol use: No  . Drug use: No  . Sexual activity: Yes  Other Topics Concern  . Not on file  Social History Narrative  . Not on file   Social  Determinants of Health   Financial Resource Strain: Low Risk   . Difficulty of Paying Living Expenses: Not hard at all  Food Insecurity: No Food Insecurity  . Worried About Charity fundraiser in the Last Year: Never true  . Ran Out of Food in the Last Year: Never true  Transportation Needs: No Transportation Needs  . Lack of Transportation (Medical): No  . Lack of Transportation (Non-Medical): No  Physical Activity: Sufficiently Active  . Days of Exercise per Week: 7 days  . Minutes of Exercise per Session: 60 min  Stress: No Stress Concern Present  . Feeling of Stress : Not at all  Social Connections: Socially Integrated  . Frequency of Communication with Friends and Family: Three times a week  . Frequency of Social Gatherings with Friends and Family: Three times a week  . Attends Religious Services: More than 4 times per year  . Active Member of Clubs or Organizations: Yes  . Attends Archivist Meetings: 1 to 4 times per year  . Marital Status: Married    Tobacco Counseling Counseling given: Not Answered   Clinical Intake:  Pre-visit preparation completed: Yes  Pain : 0-10 Pain Score: 8  Pain Location: Generalized Pain Radiating Towards: spine and leg Pain Descriptors / Indicators: Burning, Numbness     Nutritional Status: BMI of 19-24  Normal Nutritional Risks: None Diabetes: No  How often do you need to have someone help you when you read instructions, pamphlets, or other written materials from your doctor or pharmacy?: 1 - Never  Diabetic? no  Interpreter Needed?: No  Information entered by :: NAllen LPN   Activities of Daily Living In your present state of health, do you have any difficulty performing the following activities: 10/27/2019 08/22/2019  Hearing? N N  Vision? N N  Difficulty concentrating or making decisions? N N  Walking or climbing stairs? Y N  Comment can do it, but has difficulty -  Dressing or bathing? N N  Doing errands,  shopping? N N  Preparing Food and eating ? N -  Using the Toilet? N -  In the past six months, have you accidently leaked urine? N -  Do you have problems with loss of bowel control? N -  Managing your Medications? N -  Managing your Finances? N -  Housekeeping or managing your Housekeeping? N -  Some recent data might be hidden    Patient Care Team: Venita Lick, NP as PCP - General (Nurse Practitioner) Rico Junker, RN as Registered Nurse Theodore Demark, RN as Registered Nurse  Indicate any recent Medical Services you may have received from other than [facility] providers in the past year (date may be approximate).     Assessment:   This is a routine wellness examination for [facility]  Hearing/Vision screen  Hearing Screening   125Hz  250Hz  500Hz  1000Hz  2000Hz  3000Hz  4000Hz  6000Hz  8000Hz   Right ear:  Left ear:           Vision Screening Comments: No regular eye exams  Dietary issues and exercise activities discussed: Current Exercise Habits: Home exercise routine, Type of exercise: strength training/weights;stretching;walking (physical therapy), Time (Minutes): 60, Frequency (Times/Week): 7, Weekly Exercise (Minutes/Week): 420  Goals    . Patient Stated     10/27/2019, wants to get back to baseline      Depression Screen PHQ 2/9 Scores 10/27/2019 08/17/2019 04/10/2019  PHQ - 2 Score 0 0 0    Fall Risk Fall Risk  10/27/2019 08/17/2019 04/10/2019  Falls in the past year? 0 0 0  Number falls in past yr: - 0 0  Injury with Fall? - 0 0  Risk for fall due to : Medication side effect - -  Follow up Falls evaluation completed;Education provided;Falls prevention discussed Falls evaluation completed -    Any stairs in or around the home? Yes  If so, are there any without handrails? No  Home free of loose throw rugs in walkways, pet beds, electrical cords, etc? Yes  Adequate lighting in your home to reduce risk of falls? Yes   ASSISTIVE DEVICES UTILIZED TO PREVENT  FALLS:  Life alert? No  Use of a cane, walker or w/c? No  Grab bars in the bathroom? No  Shower chair or bench in shower? No  Elevated toilet seat or a handicapped toilet? No   TIMED UP AND GO:  Was the test performed? No .    Cognitive Function:     6CIT Screen 10/27/2019  What Year? 0 points  What month? 0 points  What time? 0 points  Count back from 20 0 points  Months in reverse 2 points  Repeat phrase 0 points  Total Score 2    Immunizations Immunization History  Administered Date(s) Administered  . Influenza-Unspecified 02/24/2019  . PFIZER SARS-COV-2 Vaccination 05/12/2019, 06/07/2019    TDAP status: Due, Education has been provided regarding the importance of this vaccine. Advised may receive this vaccine at local pharmacy or Health Dept. Aware to provide a copy of the vaccination record if obtained from local pharmacy or Health Dept. Verbalized acceptance and understanding. Flu Vaccine status: Up to date Pneumococcal vaccine status: Due Covid-19 vaccine status: Completed vaccines  Qualifies for Shingles Vaccine? Yes   Zostavax completed No   Shingrix Completed?: No.    Education has been provided regarding the importance of this vaccine. Patient has been advised to call insurance company to determine out of pocket expense if they have not yet received this vaccine. Advised may also receive vaccine at local pharmacy or Health Dept. Verbalized acceptance and understanding.  Screening Tests Health Maintenance  Topic Date Due  . COLONOSCOPY  Never done  . DEXA SCAN  Never done  . MAMMOGRAM  03/25/2019  . INFLUENZA VACCINE  10/15/2019  . TETANUS/TDAP  08/16/2020 (Originally 12/17/1969)  . PNA vac Low Risk Adult (1 of 2 - PCV13) 08/16/2020 (Originally 12/16/2017)  . COVID-19 Vaccine  Completed  . Hepatitis C Screening  Completed    Health Maintenance  Health Maintenance Due  Topic Date Due  . COLONOSCOPY  Never done  . DEXA SCAN  Never done  . MAMMOGRAM   03/25/2019  . INFLUENZA VACCINE  10/15/2019    Colorectal cancer screening: Due Mammogram status: Due Bone Density status: Due  Lung Cancer Screening: (Low Dose CT Chest recommended if Age 15-80 years, 30 pack-year currently smoking OR have quit w/in 15years.) does not qualify.  Lung Cancer Screening Referral: no  Additional Screening:  Hepatitis C Screening: does qualify; Completed 08/17/2019  Vision Screening: Recommended annual ophthalmology exams for early detection of glaucoma and other disorders of the eye. Is the patient up to date with their annual eye exam?  No  Who is the provider or what is the name of the office in which the patient attends annual eye exams? none If pt is not established with a provider, would they like to be referred to a provider to establish care? No .   Dental Screening: Recommended annual dental exams for proper oral hygiene  Community Resource Referral / Chronic Care Management: CRR required this visit?  No   CCM required this visit?  No      Plan:     I have personally reviewed and noted the following in the patient's chart:   . Medical and social history . Use of alcohol, tobacco or illicit drugs  . Current medications and supplements . Functional ability and status . Nutritional status . Physical activity . Advanced directives . List of other physicians . Hospitalizations, surgeries, and ER visits in previous 12 months . Vitals . Screenings to include cognitive, depression, and falls . Referrals and appointments  In addition, I have reviewed and discussed with patient certain preventive protocols, quality metrics, and best practice recommendations. A written personalized care plan for preventive services as well as general preventive health recommendations were provided to patient.     Kellie Simmering, LPN   07/10/621   Nurse Notes: Patient is going to check with [employer] to be sure that they cover bone density. Then she will  schedule that and her mammogram.

## 2019-11-15 ENCOUNTER — Telehealth: Payer: Self-pay | Admitting: Nurse Practitioner

## 2019-11-15 NOTE — Telephone Encounter (Signed)
Copied from [place] [id number]. Topic: General - Other >> Nov 15, 2019  1:44 PM Rainey Pines A wrote: Patient is requesting a callback from [facility] nurse in regards to if she can get the flu shot and shingles shot in the same sitting and if an order can be placed for her for both shots as well as a bone density exam. Please advise

## 2019-11-15 NOTE — Telephone Encounter (Signed)
Pleas alert her that technically she can get both Shingrix and flu vaccine on same day, both are live viral vaccines.  Shingrix is a two shot series, would get second a couple months later.  She could obtain them in office, I would tell her to check with insurance first if Shingrix will be covered at provider office.  If they will cover here she could get both here.  I will order bone density.

## 2019-11-16 ENCOUNTER — Other Ambulatory Visit: Payer: Self-pay | Admitting: Nurse Practitioner

## 2019-11-16 DIAGNOSIS — Z780 Asymptomatic menopausal state: Secondary | ICD-10-CM

## 2019-11-16 DIAGNOSIS — Z1231 Encounter for screening mammogram for malignant neoplasm of breast: Secondary | ICD-10-CM

## 2019-11-16 NOTE — Telephone Encounter (Signed)
Ordered for [place] location she can call to schedule.

## 2019-11-16 NOTE — Telephone Encounter (Signed)
Pt aware of message. She asked if mammogram and bone density can be scheduled somewhere in [place].

## 2019-11-16 NOTE — Telephone Encounter (Signed)
Patient notified. Phone number to [facility] also provided to patient. Asked her to let them know she would like to be scheduled at the [facility] location.

## 2019-12-07 ENCOUNTER — Other Ambulatory Visit: Payer: Self-pay

## 2019-12-07 ENCOUNTER — Ambulatory Visit
Admission: RE | Admit: 2019-12-07 | Discharge: 2019-12-07 | Disposition: A | Discharge status: Home / Self Care | Payer: Medicare HMO | Source: Ambulatory Visit | Attending: Nurse Practitioner | Admitting: Nurse Practitioner

## 2019-12-07 ENCOUNTER — Encounter (INDEPENDENT_AMBULATORY_CARE_PROVIDER_SITE_OTHER): Payer: Self-pay

## 2019-12-07 DIAGNOSIS — Z780 Asymptomatic menopausal state: Secondary | ICD-10-CM | POA: Diagnosis not present

## 2019-12-07 DIAGNOSIS — Z1231 Encounter for screening mammogram for malignant neoplasm of breast: Secondary | ICD-10-CM

## 2019-12-07 NOTE — Progress Notes (Signed)
Please let [name] know:  Your bone density shows thinning bones (osteopenia) but not brittle (osteoporosis). We recommend Vitamin D supplementation of about 2,0000 IUs of over the counter Vitamin D3. In addition, we recommend a diet high in calcium with dairy and dark green leafy vegetables. We would like you to get plenty of weight bearing exercises with walking and resistance training such as light weights or resistance bands available with instructions at places such as [employer]. Next DEXA 2026.:)

## 2020-01-26 ENCOUNTER — Other Ambulatory Visit: Payer: Self-pay | Admitting: Orthopedic Surgery

## 2020-01-26 DIAGNOSIS — M25311 Other instability, right shoulder: Secondary | ICD-10-CM

## 2020-02-11 ENCOUNTER — Encounter: Payer: Self-pay | Admitting: Nurse Practitioner

## 2020-02-11 DIAGNOSIS — M8580 Other specified disorders of bone density and structure, unspecified site: Secondary | ICD-10-CM | POA: Insufficient documentation

## 2020-02-13 ENCOUNTER — Ambulatory Visit
Admission: RE | Admit: 2020-02-13 | Discharge: 2020-02-13 | Disposition: A | Discharge status: Home / Self Care | Payer: Medicare HMO | Source: Ambulatory Visit | Attending: Orthopedic Surgery | Admitting: Orthopedic Surgery

## 2020-02-13 ENCOUNTER — Other Ambulatory Visit: Payer: Self-pay

## 2020-02-13 DIAGNOSIS — M25311 Other instability, right shoulder: Secondary | ICD-10-CM | POA: Diagnosis not present

## 2020-02-16 ENCOUNTER — Other Ambulatory Visit: Payer: Self-pay

## 2020-02-16 ENCOUNTER — Ambulatory Visit (INDEPENDENT_AMBULATORY_CARE_PROVIDER_SITE_OTHER): Payer: Medicare HMO | Admitting: Nurse Practitioner

## 2020-02-16 ENCOUNTER — Encounter: Payer: Self-pay | Admitting: Nurse Practitioner

## 2020-02-16 VITALS — BP 117/71 | HR 100 | Temp 98.1°F | Wt 101.6 lb

## 2020-02-16 DIAGNOSIS — N1831 Chronic kidney disease, stage 3a: Secondary | ICD-10-CM | POA: Diagnosis not present

## 2020-02-16 DIAGNOSIS — I10 Essential (primary) hypertension: Secondary | ICD-10-CM

## 2020-02-16 DIAGNOSIS — M8588 Other specified disorders of bone density and structure, other site: Secondary | ICD-10-CM

## 2020-02-16 DIAGNOSIS — I8393 Asymptomatic varicose veins of bilateral lower extremities: Secondary | ICD-10-CM | POA: Diagnosis not present

## 2020-02-16 DIAGNOSIS — E559 Vitamin D deficiency, unspecified: Secondary | ICD-10-CM

## 2020-02-16 DIAGNOSIS — R6889 Other general symptoms and signs: Secondary | ICD-10-CM | POA: Insufficient documentation

## 2020-02-16 MED ORDER — METHOCARBAMOL 500 MG PO TABS: 500.0000 mg | ORAL_TABLET | Freq: Four times a day (QID) | ORAL | 0 refills | Status: DC | PRN

## 2020-02-16 MED ORDER — LISINOPRIL 20 MG PO TABS: 20.0000 mg | ORAL_TABLET | Freq: Every day | ORAL | 4 refills | Status: DC

## 2020-02-16 NOTE — Assessment & Plan Note (Signed)
Mild edema present left foot.  Will place referral to vascular per patient request.

## 2020-02-16 NOTE — Patient Instructions (Signed)
DASH Eating Plan DASH stands for "Dietary Approaches to Stop Hypertension." The DASH eating plan is a healthy eating plan that has been shown to reduce high blood pressure (hypertension). It may also reduce your risk for type 2 diabetes, heart disease, and stroke. The DASH eating plan may also help with weight loss. What are tips for following this plan?  General guidelines  Avoid eating more than 2,300 mg (milligrams) of salt (sodium) a day. If you have hypertension, you may need to reduce your sodium intake to 1,500 mg a day.  Limit alcohol intake to no more than 1 drink a day for nonpregnant women and 2 drinks a day for men. One drink equals 12 oz of beer, 5 oz of wine, or 1 oz of hard liquor.  Work with your health care provider to maintain a healthy body weight or to lose weight. Ask what an ideal weight is for you.  Get at least 30 minutes of exercise that causes your heart to beat faster (aerobic exercise) most days of the week. Activities may include walking, swimming, or biking.  Work with your health care provider or diet and nutrition specialist (dietitian) to adjust your eating plan to your individual calorie needs. Reading food labels   Check food labels for the amount of sodium per serving. Choose foods with less than 5 percent of the Daily Value of sodium. Generally, foods with less than 300 mg of sodium per serving fit into this eating plan.  To find whole grains, look for the word "whole" as the first word in the ingredient list. Shopping  Buy products labeled as "low-sodium" or "no salt added."  Buy fresh foods. Avoid canned foods and premade or frozen meals. Cooking  Avoid adding salt when cooking. Use salt-free seasonings or herbs instead of table salt or sea salt. Check with your health care provider or pharmacist before using salt substitutes.  Do not fry foods. Cook foods using healthy methods such as baking, boiling, grilling, and broiling instead.  Cook with  heart-healthy oils, such as olive, canola, soybean, or sunflower oil. Meal planning  Eat a balanced diet that includes: ? 5 or more servings of fruits and vegetables each day. At each meal, try to fill half of your plate with fruits and vegetables. ? Up to 6-8 servings of whole grains each day. ? Less than 6 oz of lean meat, poultry, or fish each day. A 3-oz serving of meat is about the same size as a deck of cards. One egg equals 1 oz. ? 2 servings of low-fat dairy each day. ? A serving of nuts, seeds, or beans 5 times each week. ? Heart-healthy fats. Healthy fats called Omega-3 fatty acids are found in foods such as flaxseeds and coldwater fish, like sardines, salmon, and mackerel.  Limit how much you eat of the following: ? Canned or prepackaged foods. ? Food that is high in trans fat, such as fried foods. ? Food that is high in saturated fat, such as fatty meat. ? Sweets, desserts, sugary drinks, and other foods with added sugar. ? Full-fat dairy products.  Do not salt foods before eating.  Try to eat at least 2 vegetarian meals each week.  Eat more home-cooked food and less restaurant, buffet, and fast food.  When eating at a restaurant, ask that your food be prepared with less salt or no salt, if possible. What foods are recommended? The items listed may not be a complete list. Talk with your dietitian about   what dietary choices are best for you. Grains Whole-grain or whole-wheat bread. Whole-grain or whole-wheat pasta. Brown rice. Oatmeal. Quinoa. Bulgur. Whole-grain and low-sodium cereals. Pita bread. Low-fat, low-sodium crackers. Whole-wheat flour tortillas. Vegetables Fresh or frozen vegetables (raw, steamed, roasted, or grilled). Low-sodium or reduced-sodium tomato and vegetable juice. Low-sodium or reduced-sodium tomato sauce and tomato paste. Low-sodium or reduced-sodium canned vegetables. Fruits All fresh, dried, or frozen fruit. Canned fruit in natural juice (without  added sugar). Meat and other protein foods Skinless chicken or turkey. Ground chicken or turkey. Pork with fat trimmed off. Fish and seafood. Egg whites. Dried beans, peas, or lentils. Unsalted nuts, nut butters, and seeds. Unsalted canned beans. Lean cuts of beef with fat trimmed off. Low-sodium, lean deli meat. Dairy Low-fat (1%) or fat-free (skim) milk. Fat-free, low-fat, or reduced-fat cheeses. Nonfat, low-sodium ricotta or cottage cheese. Low-fat or nonfat yogurt. Low-fat, low-sodium cheese. Fats and oils Soft margarine without trans fats. Vegetable oil. Low-fat, reduced-fat, or light mayonnaise and salad dressings (reduced-sodium). Canola, safflower, olive, soybean, and sunflower oils. Avocado. Seasoning and other foods Herbs. Spices. Seasoning mixes without salt. Unsalted popcorn and pretzels. Fat-free sweets. What foods are not recommended? The items listed may not be a complete list. Talk with your dietitian about what dietary choices are best for you. Grains Baked goods made with fat, such as croissants, muffins, or some breads. Dry pasta or rice meal packs. Vegetables Creamed or fried vegetables. Vegetables in a cheese sauce. Regular canned vegetables (not low-sodium or reduced-sodium). Regular canned tomato sauce and paste (not low-sodium or reduced-sodium). Regular tomato and vegetable juice (not low-sodium or reduced-sodium). Pickles. Olives. Fruits Canned fruit in a light or heavy syrup. Fried fruit. Fruit in cream or butter sauce. Meat and other protein foods Fatty cuts of meat. Ribs. Fried meat. Bacon. Sausage. Bologna and other processed lunch meats. Salami. Fatback. Hotdogs. Bratwurst. Salted nuts and seeds. Canned beans with added salt. Canned or smoked fish. Whole eggs or egg yolks. Chicken or turkey with skin. Dairy Whole or 2% milk, cream, and half-and-half. Whole or full-fat cream cheese. Whole-fat or sweetened yogurt. Full-fat cheese. Nondairy creamers. Whipped toppings.  Processed cheese and cheese spreads. Fats and oils Butter. Stick margarine. Lard. Shortening. Ghee. Bacon fat. Tropical oils, such as coconut, palm kernel, or palm oil. Seasoning and other foods Salted popcorn and pretzels. Onion salt, garlic salt, seasoned salt, table salt, and sea salt. Worcestershire sauce. Tartar sauce. Barbecue sauce. Teriyaki sauce. Soy sauce, including reduced-sodium. Steak sauce. Canned and packaged gravies. Fish sauce. Oyster sauce. Cocktail sauce. Horseradish that you find on the shelf. Ketchup. Mustard. Meat flavorings and tenderizers. Bouillon cubes. Hot sauce and Tabasco sauce. Premade or packaged marinades. Premade or packaged taco seasonings. Relishes. Regular salad dressings. Where to find more information:  National Heart, Lung, and Blood Institute: www.nhlbi.nih.gov  American Heart Association: www.heart.org Summary  The DASH eating plan is a healthy eating plan that has been shown to reduce high blood pressure (hypertension). It may also reduce your risk for type 2 diabetes, heart disease, and stroke.  With the DASH eating plan, you should limit salt (sodium) intake to 2,300 mg a day. If you have hypertension, you may need to reduce your sodium intake to 1,500 mg a day.  When on the DASH eating plan, aim to eat more fresh fruits and vegetables, whole grains, lean proteins, low-fat dairy, and heart-healthy fats.  Work with your health care provider or diet and nutrition specialist (dietitian) to adjust your eating plan to your   individual calorie needs. This information is not intended to replace advice given to you by your health care provider. Make sure you discuss any questions you have with your health care provider. Document Revised: 02/12/2017 Document Reviewed: 02/24/2016 Elsevier Patient Education  2020 Elsevier Inc.  

## 2020-02-16 NOTE — Assessment & Plan Note (Signed)
Noted on DEXA 12/07/19 with T score -2.2.  Continue daily Vitamin D and check level today.  Next scan 12/06/2024. °

## 2020-02-16 NOTE — Progress Notes (Signed)
BP 117/71    Pulse 100    Temp 98.1 F (36.7 C)    Wt 101 lb 9.6 oz (46.1 kg)    SpO2 98%    BMI 18.58 kg/m    Subjective:    Patient ID: Danielle Stephens, female    DOB: 1951/01/04, 69 y.o.   MRN: [id number]  HPI: Danielle Stephens is a 69 y.o. female  Chief Complaint  Patient presents with   Hypertension   Edema    pt still has ongoing leg swelling    neck concern    pt states man from [facility] did a home visit and told her he heard something abnormal in her neck, pt does not remember what side of the neck    HYPERTENSION Continues on Lisinopril 20 MG daily.  She reports noticing ongoing leg swelling with her varicosities L>R and that when a person from [facility] recently visited her they told her they heard something abnormal in her neck. Hypertension status: stable  Satisfied with current treatment? yes Duration of hypertension: chronic BP monitoring frequency:  rarely BP range: similar numbers to today BP medication side effects:  no Medication compliance: good compliance Previous BP meds: Lisinopril Aspirin: no Recurrent headaches: no Visual changes: no Palpitations: no Dyspnea: no Chest pain: no Lower extremity edema: at baseline with varicose veins -- L>R Dizzy/lightheaded: no   OSTEOPENIA Noted on DEXA 12/07/19 with T score -2.2 Satisfied with current treatment?: yes Adequate calcium & vitamin D: yes Intolerance to bisphosphonates:none taken at this time Weight bearing exercises: yes  Relevant past medical, surgical, family and social history reviewed and updated as indicated. Interim medical history since our last visit reviewed. Allergies and medications reviewed and updated.  Review of Systems  Per HPI unless specifically indicated above     Objective:    BP 117/71    Pulse 100    Temp 98.1 F (36.7 C)    Wt 101 lb 9.6 oz (46.1 kg)    SpO2 98%    BMI 18.58 kg/m   Wt Readings from Last 3 Encounters:  02/16/20 101 lb 9.6 oz (46.1 kg)  10/27/19 103 lb (46.7 kg)    09/21/19 100 lb 9.6 oz (45.6 kg)    Physical Exam Vitals and nursing note reviewed.  Constitutional:      General: She is awake. She is not in acute distress.    Appearance: She is well-developed and well-groomed. She is not ill-appearing.  HENT:     Head: Normocephalic.     Right Ear: Hearing normal.     Left Ear: Hearing normal.  Eyes:     General: Lids are normal.        Right eye: No discharge.        Left eye: No discharge.     Conjunctiva/sclera: Conjunctivae normal.     Pupils: Pupils are equal, round, and reactive to light.  Neck:     Thyroid: No thyromegaly.     Vascular: No carotid bruit.     Comments: No bruits auscultated. Cardiovascular:     Rate and Rhythm: Normal rate and regular rhythm.     Pulses:          Popliteal pulses are 2+ on the right side and 2+ on the left side.       Dorsalis pedis pulses are 2+ on the right side and 2+ on the left side.     Heart sounds: Normal heart sounds. No murmur heard.  No  gallop.      Comments: Minima edema at toe line left foot.  Varicose veins bilaterally L>R, significant to top of left leg. Pulmonary:     Effort: Pulmonary effort is normal.     Breath sounds: Normal breath sounds.  Abdominal:     General: Bowel sounds are normal.     Palpations: Abdomen is soft. There is no hepatomegaly or splenomegaly.  Musculoskeletal:     Cervical back: Normal range of motion and neck supple.     Right lower leg: No edema.     Left lower leg: No edema.  Skin:    General: Skin is warm and dry.  Neurological:     Mental Status: She is alert and oriented to person, place, and time.  Psychiatric:        Attention and Perception: Attention normal.        Mood and Affect: Mood normal.        Speech: Speech normal.        Behavior: Behavior normal. Behavior is cooperative.        Thought Content: Thought content normal.    Results for orders placed or performed in visit on 09/21/19  CBC with Differential/Platelet  Result Value  Ref Range   WBC 4.3 3.4 - 10.8 x10E3/uL   RBC 4.51 3.77 - 5.28 x10E6/uL   Hemoglobin 12.4 11.1 - 15.9 g/dL   Hematocrit 38.5 34.0 - 46.6 %   MCV 85 79 - 97 fL   MCH 27.5 26.6 - 33.0 pg   MCHC 32.2 31 - 35 g/dL   RDW 12.5 11.7 - 15.4 %   Platelets 259 150 - 450 x10E3/uL   Neutrophils 53 Not Estab. %   Lymphs 31 Not Estab. %   Monocytes 10 Not Estab. %   Eos 5 Not Estab. %   Basos 1 Not Estab. %   Neutrophils Absolute 2.3 1.40 - 7.00 x10E3/uL   Lymphocytes Absolute 1.3 0 - 3 x10E3/uL   Monocytes Absolute 0.4 0 - 0 x10E3/uL   EOS (ABSOLUTE) 0.2 0.0 - 0.4 x10E3/uL   Basophils Absolute 0.0 0 - 0 x10E3/uL   Immature Granulocytes 0 Not Estab. %   Immature Grans (Abs) 0.0 0.0 - 0.1 x10E3/uL  Comprehensive metabolic panel  Result Value Ref Range   Glucose 91 65 - 99 mg/dL   BUN 13 8 - 27 mg/dL   Creatinine, Ser 0.94 0.57 - 1.00 mg/dL   GFR calc non Af Amer 63 >59 mL/min/1.73   GFR calc Af Amer 73 >59 mL/min/1.73   BUN/Creatinine Ratio 14 12 - 28   Sodium 142 134 - 144 mmol/L   Potassium 4.4 3.5 - 5.2 mmol/L   Chloride 103 96 - 106 mmol/L   CO2 26 20 - 29 mmol/L   Calcium 9.6 8.7 - 10.3 mg/dL   Total Protein 6.9 6.0 - 8.5 g/dL   Albumin 4.5 3.8 - 4.8 g/dL   Globulin, Total 2.4 1.5 - 4.5 g/dL   Albumin/Globulin Ratio 1.9 1.2 - 2.2   Bilirubin Total 0.4 0.0 - 1.2 mg/dL   Alkaline Phosphatase 78 48 - 121 IU/L   AST 19 0 - 40 IU/L   ALT 12 0 - 32 IU/L  Uric acid  Result Value Ref Range   Uric Acid 5.0 3.0 - 7.2 mg/dL  D-Dimer, Quantitative  Result Value Ref Range   D-DIMER 2.32 (H) 0.00 - 0.49 mg/L FEU      Assessment & Plan:   Problem  List Items Addressed This Visit      Cardiovascular and Mediastinum   Benign essential HTN    Chronic, stable.  BP at goal today and on occasional home readings.  Continue current medication regimen and adjust as needed.  Will send refills as needed.  Obtain labs today: BMP.  Recommend focus on DASH diet at home and continue to monitor BP  regularly.  Return in 6 months for physical.      Relevant Medications   lisinopril (ZESTRIL) 20 MG tablet   Other Relevant Orders   Basic metabolic panel   Asymptomatic varicose veins of bilateral lower extremities    Mild edema present left foot.  Will place referral to vascular per patient request.      Relevant Medications   lisinopril (ZESTRIL) 20 MG tablet   Other Relevant Orders   Ambulatory referral to Vascular Surgery     Musculoskeletal and Integument   Osteopenia    Noted on DEXA 12/07/19 with T score -2.2.  Continue daily Vitamin D and check level today.  Next scan 12/06/2024.        Genitourinary   CKD (chronic kidney disease) stage 3, GFR 30-59 ml/min (HCC) - Primary    Improved last check and on Lisinopril.  Recheck BMP today.        Other   Vitamin D deficiency    Noted on past labs with osteopenia.  Continue supplement and check Vit D level today.      Relevant Orders   VITAMIN D 25 Hydroxy (Vit-D Deficiency, Fractures)   Abnormal neck finding    No bruit appreciated on exam today bilaterally, will continue to monitor.          Follow up plan: Return in about 6 months (around 08/16/2020) for Due for annual physical after 08/16/2020.

## 2020-02-16 NOTE — Assessment & Plan Note (Signed)
Chronic, stable.  BP at goal today and on occasional home readings.  Continue current medication regimen and adjust as needed.  Will send refills as needed.  Obtain labs today: BMP.  Recommend focus on DASH diet at home and continue to monitor BP regularly.  Return in 6 months for physical.

## 2020-02-16 NOTE — Assessment & Plan Note (Signed)
Noted on past labs with osteopenia.  Continue supplement and check Vit D level today. 

## 2020-02-16 NOTE — Assessment & Plan Note (Signed)
Improved last check and on Lisinopril.  Recheck BMP today.

## 2020-02-16 NOTE — Assessment & Plan Note (Signed)
No bruit appreciated on exam today bilaterally, will continue to monitor.

## 2020-02-17 LAB — BASIC METABOLIC PANEL
BUN/Creatinine Ratio: 19 (ref 12–28)
BUN: 18 mg/dL (ref 8–27)
CO2: 24 mmol/L (ref 20–29)
Calcium: 9.8 mg/dL (ref 8.7–10.3)
Chloride: 101 mmol/L (ref 96–106)
Creatinine, Ser: 0.96 mg/dL (ref 0.57–1.00)
GFR calc Af Amer: 71 mL/min/{1.73_m2} (ref >59)
GFR calc non Af Amer: 61 mL/min/{1.73_m2} (ref >59)
Glucose: 112 mg/dL — ABNORMAL HIGH (ref 65–99)
Potassium: 4.4 mmol/L (ref 3.5–5.2)
Sodium: 138 mmol/L (ref 134–144)

## 2020-02-17 LAB — VITAMIN D 25 HYDROXY (VIT D DEFICIENCY, FRACTURES): Vit D, 25-Hydroxy: 36.9 ng/mL (ref 30.0–100.0)

## 2020-02-18 NOTE — Progress Notes (Signed)
Please let Danielle Stephens know her labs have returned and Vitamin D level is normal, continue supplement daily as is helping.  Kidney function and electrolytes remain stable.  Any questions?  You are doing fantastic!! Keep being awesome!!  Thank you for allowing me to participate in your care. Kindest regards, Ariyon Mittleman

## 2020-03-27 ENCOUNTER — Other Ambulatory Visit: Payer: Self-pay

## 2020-03-27 ENCOUNTER — Encounter (INDEPENDENT_AMBULATORY_CARE_PROVIDER_SITE_OTHER): Payer: Self-pay | Admitting: Nurse Practitioner

## 2020-03-27 ENCOUNTER — Ambulatory Visit (INDEPENDENT_AMBULATORY_CARE_PROVIDER_SITE_OTHER): Payer: Medicare HMO | Admitting: Nurse Practitioner

## 2020-03-27 VITALS — BP 129/73 | HR 83 | Ht 62.0 in | Wt 102.0 lb

## 2020-03-27 DIAGNOSIS — I8393 Asymptomatic varicose veins of bilateral lower extremities: Secondary | ICD-10-CM | POA: Diagnosis not present

## 2020-03-27 DIAGNOSIS — I10 Essential (primary) hypertension: Secondary | ICD-10-CM | POA: Diagnosis not present

## 2020-03-27 DIAGNOSIS — M79605 Pain in left leg: Secondary | ICD-10-CM | POA: Diagnosis not present

## 2020-03-27 DIAGNOSIS — M79604 Pain in right leg: Secondary | ICD-10-CM

## 2020-03-27 NOTE — Progress Notes (Signed)
Subjective:    Patient ID: Danielle Stephens, female    DOB: [birth year], 70 y.o.   MRN: [id number] Chief Complaint  Patient presents with  . New Patient (Initial Visit)  . Varicose Veins    BIL LE    The patient is seen for evaluation of painful lower extremities. Patient notes the pain is variable and not always associated with activity.  The pain is somewhat consistent day to day occurring on most days. The patient notes the pain also occurs with standing and routinely seems worse as the day wears on. The pain has been progressive over the past several years. The patient states these symptoms are causing  a profound negative impact on quality of life and daily activities.  The patient notes that there was pain even with light things around her foot.  She notes that she is not able to tolerate these things.  She also describes some similar restless leg syndrome symptoms.  The patient denies rest pain or dangling of an extremity off the side of the bed during the night for relief. No open wounds or sores at this time. No history of DVT or phlebitis. No prior interventions or surgeries.     Review of Systems  Cardiovascular: Positive for leg swelling.  Musculoskeletal: Positive for arthralgias and myalgias.  All other systems reviewed and are negative.      Objective:   Physical Exam Vitals reviewed.  HENT:     Head: Normocephalic.  Neck:     Vascular: No carotid bruit.  Cardiovascular:     Rate and Rhythm: Normal rate.     Pulses: Normal pulses.  Pulmonary:     Effort: Pulmonary effort is normal.  Musculoskeletal:        General: Swelling (Minimal left foot) present.  Skin:    General: Skin is warm and dry.  Neurological:     Mental Status: She is alert and oriented to person, place, and time.  Psychiatric:        Mood and Affect: Mood normal.        Behavior: Behavior normal.        Thought Content: Thought content normal.        Judgment: Judgment normal.     BP  129/73   Pulse 83   Ht 5\' 2"  (1.575 m)   Wt 102 lb (46.3 kg)   BMI 18.66 kg/m   Past Medical History:  Diagnosis Date  . Anxiety   . Hypertension   . Shoulder pain     Social History   Socioeconomic History  . Marital status: Married    Spouse name: Not on file  . Number of children: 2  . Years of education: Not on file  . Highest education level: Not on file  Occupational History  . Occupation: retired  Tobacco Use  . Smoking status: Never Smoker  . Smokeless tobacco: Never Used  Vaping Use  . Vaping Use: Never used  Substance and Sexual Activity  . Alcohol use: No  . Drug use: No  . Sexual activity: Yes  Other Topics Concern  . Not on file  Social History Narrative  . Not on file   Social Determinants of Health   Financial Resource Strain: Low Risk   . Difficulty of Paying Living Expenses: Not hard at all  Food Insecurity: No Food Insecurity  . Worried About Charity fundraiser in the Last Year: Never true  . Ran Out of Food in the  Last Year: Never true  Transportation Needs: No Transportation Needs  . Lack of Transportation (Medical): No  . Lack of Transportation (Non-Medical): No  Physical Activity: Sufficiently Active  . Days of Exercise per Week: 7 days  . Minutes of Exercise per Session: 60 min  Stress: No Stress Concern Present  . Feeling of Stress : Not at all  Social Connections: Socially Integrated  . Frequency of Communication with Friends and Family: Three times a week  . Frequency of Social Gatherings with Friends and Family: Three times a week  . Attends Religious Services: More than 4 times per year  . Active Member of Clubs or Organizations: Yes  . Attends Archivist Meetings: 1 to 4 times per year  . Marital Status: Married  Human resources officer Violence: Not on file    Past Surgical History:  Procedure Laterality Date  . CLOSED MANIPULATION SHOULDER WITH STERIOD INJECTION Right 08/28/2019   Procedure: MANIPULATION SHOULDER WITH  STEROID INJECTION x2;  Surgeon: Leim Fabry, MD;  Location: [facility] ORS;  Service: Orthopedics;  Laterality: Right;  . DILATION AND CURETTAGE, DIAGNOSTIC / THERAPEUTIC    . rotary cuff Right 03/06/2019  . SHOULDER ARTHROSCOPY Right 08/28/2019   Procedure: Right shoulder arthroscopic capsular release, lysis of adhesion,;  Surgeon: Leim Fabry, MD;  Location: [facility] ORS;  Service: Orthopedics;  Laterality: Right;    Family History  Problem Relation Age of Onset  . Hypertension Mother   . Cancer Mother   . Hypertension Father   . Heart attack Father   . Stroke Sister   . Hypertension Sister   . Heart attack Brother   . Hypertension Sister   . Hypertension Sister   . Hypertension Sister   . Hypertension Sister   . Hypertension Sister   . Down syndrome Brother   . Breast cancer Neg Hx     Allergies  Allergen Reactions  . Codeine Rash  . Tramadol Hives    CBC Latest Ref Rng & Units 09/21/2019 08/17/2019 03/02/2019  WBC 3.4 - 10.8 x10E3/uL 4.3 5.1 4.7  Hemoglobin 11.1 - 15.9 g/dL 12.4 13.3 12.7  Hematocrit 34.0 - 46.6 % 38.5 41.5 39.7  Platelets 150 - 450 x10E3/uL 259 260 229      CMP     Component Value Date/Time   NA 138 02/16/2020 1438   K 4.4 02/16/2020 1438   CL 101 02/16/2020 1438   CO2 24 02/16/2020 1438   GLUCOSE 112 (H) 02/16/2020 1438   GLUCOSE 74 03/02/2019 0933   BUN 18 02/16/2020 1438   CREATININE 0.96 02/16/2020 1438   CALCIUM 9.8 02/16/2020 1438   PROT 6.9 09/21/2019 1026   ALBUMIN 4.5 09/21/2019 1026   AST 19 09/21/2019 1026   ALT 12 09/21/2019 1026   ALKPHOS 78 09/21/2019 1026   BILITOT 0.4 09/21/2019 1026   GFRNONAA 61 02/16/2020 1438   GFRAA 71 02/16/2020 1438     No results found.     Assessment & Plan:   1. Asymptomatic varicose veins of bilateral lower extremities  Recommend:  The patient hasvaricose veins that are painful and associated with swelling.   I have had a long discussion with the patient regarding  varicose veins and why they  cause symptoms.  Patient will begin wearing graduated compression stockings class 1 on a daily basis, beginning first thing in the morning and removing them in the evening. The patient is instructed specifically not to sleep in the stockings.    The patient  will  also begin using over-the-counter analgesics such as Motrin 600 mg po TID to help control the symptoms.    In addition, behavioral modification including elevation during the day will be initiated.      An  ultrasound of the venous system will be obtained.   Further plans will be based on the ultrasound results and whether conservative therapies are successful at eliminating the pain and swelling.   2. Pain in both lower extremities  Recommend:  The patient has atypical pain symptoms for pure atherosclerotic disease. 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  Noninvasive studies including ABI's and venous ultrasound of the legs will be obtained and the patient will follow up with me to review these studies.  I suspect the patient is c/o pseudoclaudication.  Patient should have an evaluation of his LS spine which I defer to the primary service.  The patient should continue walking and begin a more formal exercise program. The patient should continue his antiplatelet therapy and aggressive treatment of the lipid abnormalities.  The patient should begin wearing graduated compression socks 15-20 mmHg strength to control edema.   3. Benign essential HTN Continue antihypertensive medications as already ordered, these medications have been reviewed and there are no changes at this time.    Current Outpatient Medications on File Prior to Visit  Medication Sig Dispense Refill  . BIOTIN PO Take by mouth.    . cholecalciferol (VITAMIN D3) 25 MCG (1000 UNIT) tablet Take 2,000 Units by mouth daily. gummie    .  lisinopril (ZESTRIL) 20 MG tablet Take 1 tablet (20 mg total) by mouth daily. 90 tablet 4  . methocarbamol (ROBAXIN) 500 MG tablet Take 1 tablet (500 mg total) by mouth every 6 (six) hours as needed for muscle spasms. 60 tablet 0  . Multiple Vitamin (MULTIVITAMIN) capsule Take 1 capsule by mouth daily.     No current facility-administered medications on file prior to visit.    There are no Patient Instructions on file for this visit. No follow-ups on file.   Kris Hartmann, NP

## 2020-04-08 ENCOUNTER — Other Ambulatory Visit (INDEPENDENT_AMBULATORY_CARE_PROVIDER_SITE_OTHER): Payer: Self-pay | Admitting: Nurse Practitioner

## 2020-04-08 DIAGNOSIS — M79661 Pain in right lower leg: Secondary | ICD-10-CM

## 2020-04-10 ENCOUNTER — Other Ambulatory Visit: Payer: Self-pay

## 2020-04-10 ENCOUNTER — Ambulatory Visit (INDEPENDENT_AMBULATORY_CARE_PROVIDER_SITE_OTHER): Payer: Medicare HMO

## 2020-04-10 ENCOUNTER — Ambulatory Visit (INDEPENDENT_AMBULATORY_CARE_PROVIDER_SITE_OTHER): Payer: Medicare HMO | Admitting: Nurse Practitioner

## 2020-04-10 VITALS — BP 132/71 | HR 73 | Ht 62.0 in | Wt 105.0 lb

## 2020-04-10 DIAGNOSIS — M79661 Pain in right lower leg: Secondary | ICD-10-CM

## 2020-04-10 DIAGNOSIS — E7800 Pure hypercholesterolemia, unspecified: Secondary | ICD-10-CM | POA: Diagnosis not present

## 2020-04-10 DIAGNOSIS — M79662 Pain in left lower leg: Secondary | ICD-10-CM

## 2020-04-10 DIAGNOSIS — I8393 Asymptomatic varicose veins of bilateral lower extremities: Secondary | ICD-10-CM

## 2020-04-10 DIAGNOSIS — I10 Essential (primary) hypertension: Secondary | ICD-10-CM | POA: Diagnosis not present

## 2020-04-20 ENCOUNTER — Encounter (INDEPENDENT_AMBULATORY_CARE_PROVIDER_SITE_OTHER): Payer: Self-pay | Admitting: Nurse Practitioner

## 2020-04-20 NOTE — Progress Notes (Signed)
Subjective:    Patient ID: Danielle Stephens, female    DOB: Feb 27, 1951, 70 y.o.   MRN: [id number] Chief Complaint  Patient presents with  . Follow-up    U/S    The patient returns today for noninvasive studies to evaluate painful lower extremities.  The patient notes that the pain she felt was variable and not always associated with activity.  However it is consistent and occurs most days.  The patient notes that the pain started after her vaccine.  She notes that the pain occurs with standing and routinely seems to be worse as the day wears on.  It has been progressive over the last several years.  They are starting to cause a negative impact on quality of life and daily activities.  She also notes pain around her foot and it becomes painful even with light touch.  This occurs on her left foot.  She also describes having some restless leg syndrome symptoms.  She denies classic claudication symptoms.  She denies rest pain or dangling extremity off the side of the bed.  There is no open wounds or sores at this time no previous history of DVT or superficial thrombophlebitis.     Today, noninvasive studies show no evidence of DVT or superficial venous thrombosis bilaterally.  No evidence of deep venous insufficiency seen bilaterally.  There is a small amount of venous reflux in the great saphenous vein at the proximal calf in the right lower extremity.  There is no reflux seen in the left lower extremity.  The patient also underwent bilateral ABIs.  The right ABI is 1.04 the left is 1.08 with strong triphasic tibial artery waveforms bilaterally.  Toe waveforms are normal.   Review of Systems  Cardiovascular: Positive for leg swelling.  All other systems reviewed and are negative.      Objective:   Physical Exam Vitals reviewed.  Cardiovascular:     Rate and Rhythm: Normal rate.     Pulses: Normal pulses.  Pulmonary:     Effort: Pulmonary effort is normal.  Musculoskeletal:        General:  Tenderness present.  Neurological:     Mental Status: She is alert and oriented to person, place, and time.  Psychiatric:        Mood and Affect: Mood normal.        Behavior: Behavior normal.        Thought Content: Thought content normal.        Judgment: Judgment normal.     BP 132/71   Pulse 73   Ht 5\' 2"  (1.575 m)   Wt 105 lb (47.6 kg)   BMI 19.20 kg/m   Past Medical History:  Diagnosis Date  . Anxiety   . Hypertension   . Shoulder pain     Social History   Socioeconomic History  . Marital status: Married    Spouse name: Not on file  . Number of children: 2  . Years of education: Not on file  . Highest education level: Not on file  Occupational History  . Occupation: retired  Tobacco Use  . Smoking status: Never Smoker  . Smokeless tobacco: Never Used  Vaping Use  . Vaping Use: Never used  Substance and Sexual Activity  . Alcohol use: No  . Drug use: No  . Sexual activity: Yes  Other Topics Concern  . Not on file  Social History Narrative  . Not on file   Social Determinants of Health  Financial Resource Strain: Low Risk   . Difficulty of Paying Living Expenses: Not hard at all  Food Insecurity: No Food Insecurity  . Worried About Charity fundraiser in the Last Year: Never true  . Ran Out of Food in the Last Year: Never true  Transportation Needs: No Transportation Needs  . Lack of Transportation (Medical): No  . Lack of Transportation (Non-Medical): No  Physical Activity: Sufficiently Active  . Days of Exercise per Week: 7 days  . Minutes of Exercise per Session: 60 min  Stress: No Stress Concern Present  . Feeling of Stress : Not at all  Social Connections: Not on file  Intimate Partner Violence: Not on file    Past Surgical History:  Procedure Laterality Date  . CLOSED MANIPULATION SHOULDER WITH STERIOD INJECTION Right 08/28/2019   Procedure: MANIPULATION SHOULDER WITH STEROID INJECTION x2;  Surgeon: Leim Fabry, MD;  Location: [facility]  ORS;  Service: Orthopedics;  Laterality: Right;  . DILATION AND CURETTAGE, DIAGNOSTIC / THERAPEUTIC    . rotary cuff Right 03/06/2019  . SHOULDER ARTHROSCOPY Right 08/28/2019   Procedure: Right shoulder arthroscopic capsular release, lysis of adhesion,;  Surgeon: Leim Fabry, MD;  Location: [facility] ORS;  Service: Orthopedics;  Laterality: Right;    Family History  Problem Relation Age of Onset  . Hypertension Mother   . Cancer Mother   . Hypertension Father   . Heart attack Father   . Stroke Sister   . Hypertension Sister   . Heart attack Brother   . Hypertension Sister   . Hypertension Sister   . Hypertension Sister   . Hypertension Sister   . Hypertension Sister   . Down syndrome Brother   . Breast cancer Neg Hx     Allergies  Allergen Reactions  . Codeine Rash  . Tramadol Hives    CBC Latest Ref Rng & Units 09/21/2019 08/17/2019 03/02/2019  WBC 3.4 - 10.8 x10E3/uL 4.3 5.1 4.7  Hemoglobin 11.1 - 15.9 g/dL 12.4 13.3 12.7  Hematocrit 34.0 - 46.6 % 38.5 41.5 39.7  Platelets 150 - 450 x10E3/uL 259 260 229      CMP     Component Value Date/Time   NA 138 02/16/2020 1438   K 4.4 02/16/2020 1438   CL 101 02/16/2020 1438   CO2 24 02/16/2020 1438   GLUCOSE 112 (H) 02/16/2020 1438   GLUCOSE 74 03/02/2019 0933   BUN 18 02/16/2020 1438   CREATININE 0.96 02/16/2020 1438   CALCIUM 9.8 02/16/2020 1438   PROT 6.9 09/21/2019 1026   ALBUMIN 4.5 09/21/2019 1026   AST 19 09/21/2019 1026   ALT 12 09/21/2019 1026   ALKPHOS 78 09/21/2019 1026   BILITOT 0.4 09/21/2019 1026   GFRNONAA 61 02/16/2020 1438   GFRAA 71 02/16/2020 1438     VAS [place] ABI WITH/WO TBI  Result Date: 04/16/2020 LOWER EXTREMITY DOPPLER STUDY Indications: Rest pain.  Performing Technologist: Almira Coaster RVS  Examination Guidelines: A complete evaluation includes at minimum, Doppler waveform signals and systolic blood pressure reading at the level of bilateral brachial, anterior tibial, and posterior tibial  arteries, when vessel segments are accessible. Bilateral testing is considered an integral part of a complete examination. Photoelectric Plethysmograph (PPG) waveforms and toe systolic pressure readings are included as required and additional duplex testing as needed. Limited examinations for reoccurring indications may be performed as noted.  ABI Findings: +---------+------------------+-----+---------+------------------+ Right    Rt Pressure (mmHg)IndexWaveform Comment            +---------+------------------+-----+---------+------------------+  Brachial                                 Rotator Cup Injury +---------+------------------+-----+---------+------------------+ ATA      139               1.04 triphasic                   +---------+------------------+-----+---------+------------------+ PTA      127               0.95 triphasic                   +---------+------------------+-----+---------+------------------+ Great Toe92                0.69 Normal                      +---------+------------------+-----+---------+------------------+ +---------+------------------+-----+---------+-------+ Left     Lt Pressure (mmHg)IndexWaveform Comment +---------+------------------+-----+---------+-------+ Brachial 134                                     +---------+------------------+-----+---------+-------+ ATA      145               1.08 triphasic        +---------+------------------+-----+---------+-------+ PTA      144               1.07 triphasic        +---------+------------------+-----+---------+-------+ Great Toe140               1.04 Normal           +---------+------------------+-----+---------+-------+ +-------+-----------+-----------+------------+------------+ ABI/TBIToday's ABIToday's TBIPrevious ABIPrevious TBI +-------+-----------+-----------+------------+------------+ Right  1.04       .69                                  +-------+-----------+-----------+------------+------------+ Left   1.08       1.04                                +-------+-----------+-----------+------------+------------+  Summary: Right: Resting right ankle-brachial index is within normal range. No evidence of significant right lower extremity arterial disease. The right toe-brachial index is normal. Left: Resting left ankle-brachial index is within normal range. No evidence of significant left lower extremity arterial disease. The left toe-brachial index is normal.  *See table(s) above for measurements and observations.  Electronically signed by Leotis Pain MD on 04/16/2020 at 11:52:23 AM.   Final        Assessment & Plan:   1. Asymptomatic varicose veins of bilateral lower extremities The patient is advised to continue with conservative therapy including utilization of medical grade 1 compression stockings.  Elevation of her lower extremities as well as activity.  Patient will follow up as needed.  2. Pain in both lower legs Based on noninvasive studies, the pain is likely musculoskeletal or neurological in nature.  Only venous abnormality seen was a small portion of the right lower extremity but the significance of the pain seems to be in the left.  Some of the pain is somewhat concerning for either bursitis or neuropathy.  The patient will follow up for further work-up with her primary care physician.  We will  see the patient on an as-needed basis.  3. Benign essential HTN Continue antihypertensive medications as already ordered, these medications have been reviewed and there are no changes at this time.   4. Elevated LDL cholesterol level Continue statin as ordered and reviewed, no changes at this time    Current Outpatient Medications on File Prior to Visit  Medication Sig Dispense Refill  . BIOTIN PO Take by mouth.    . cholecalciferol (VITAMIN D3) 25 MCG (1000 UNIT) tablet Take 2,000 Units by mouth daily. gummie    . lisinopril  (ZESTRIL) 20 MG tablet Take 1 tablet (20 mg total) by mouth daily. 90 tablet 4  . methocarbamol (ROBAXIN) 500 MG tablet Take 1 tablet (500 mg total) by mouth every 6 (six) hours as needed for muscle spasms. 60 tablet 0  . Multiple Vitamin (MULTIVITAMIN) capsule Take 1 capsule by mouth daily.     No current facility-administered medications on file prior to visit.    There are no Patient Instructions on file for this visit. No follow-ups on file.   Kris Hartmann, NP

## 2020-04-25 ENCOUNTER — Other Ambulatory Visit: Payer: Self-pay | Admitting: Orthopedic Surgery

## 2020-04-25 ENCOUNTER — Other Ambulatory Visit (HOSPITAL_COMMUNITY): Payer: Self-pay | Admitting: Orthopedic Surgery

## 2020-04-25 DIAGNOSIS — M25311 Other instability, right shoulder: Secondary | ICD-10-CM

## 2020-05-01 ENCOUNTER — Other Ambulatory Visit: Payer: Self-pay | Admitting: Orthopedic Surgery

## 2020-05-07 ENCOUNTER — Ambulatory Visit
Admission: RE | Admit: 2020-05-07 | Discharge: 2020-05-07 | Disposition: A | Discharge status: Home / Self Care | Payer: Medicare HMO | Source: Ambulatory Visit | Attending: Orthopedic Surgery | Admitting: Orthopedic Surgery

## 2020-05-07 ENCOUNTER — Other Ambulatory Visit: Payer: Self-pay

## 2020-05-07 DIAGNOSIS — M25311 Other instability, right shoulder: Secondary | ICD-10-CM | POA: Diagnosis not present

## 2020-05-10 ENCOUNTER — Other Ambulatory Visit
Admission: RE | Admit: 2020-05-10 | Discharge: 2020-05-10 | Disposition: A | Discharge status: Home / Self Care | Payer: Medicare HMO | Source: Ambulatory Visit | Attending: Orthopedic Surgery | Admitting: Orthopedic Surgery

## 2020-05-10 ENCOUNTER — Other Ambulatory Visit: Payer: Self-pay

## 2020-05-10 HISTORY — DX: Chronic kidney disease, unspecified: N18.9

## 2020-05-10 NOTE — Patient Instructions (Addendum)
Your procedure is scheduled on: 05/20/2020- MONDAY Report to the Registration Desk on the 1st floor of the [facility]. To find out your arrival time, please call [phone] between 1PM - 3PM on: 05/17/2020- Friday COVID TEST 05/16/2020 AT [facility] [address] 8AM- 1PM  LABS 9AM 05/16/20 AT [facility]  REMEMBER: Instructions that are not followed completely may result in serious medical risk, up to and including death; or upon the discretion of your surgeon and anesthesiologist your surgery may need to be rescheduled.  Do not eat food after midnight the night before surgery.  No gum chewing, lozengers or hard candies.  You may however, drink CLEAR liquids up to 2 hours before you are scheduled to arrive for your surgery. Do not drink anything within 2 hours of your scheduled arrival time.  Clear liquids include: - water  - apple juice without pulp - gatorade (not RED, PURPLE, OR BLUE) - black coffee or tea (Do NOT add milk or creamers to the coffee or tea) Do NOT drink anything that is not on this list.  Type 1 and Type 2 diabetics should only drink water.  In addition, your doctor has ordered for you to drink the provided  Ensure Pre-Surgery Clear Carbohydrate Drink  Drinking this carbohydrate drink up to two hours before surgery helps to reduce insulin resistance and improve patient outcomes. Please complete drinking 2 hours prior to scheduled arrival time.  TAKE THESE MEDICATIONS THE MORNING OF SURGERY WITH A SIP OF WATER: NONE   Follow recommendations from Cardiologist, Pulmonologist or PCP regarding stopping Aspirin, Coumadin, Plavix, Eliquis, Pradaxa, or Pletal.  One week prior to surgery: meloxicam (MOBIC) 15 MG tablet STOP TAKING 05/13/2020 Stop Anti-inflammatories (NSAIDS) such as Advil, Aleve, Ibuprofen, Motrin, Naproxen, Naprosyn and Aspirin based products such as Excedrin, Goodys Powder, BC Powder.  Stop ANY OVER THE COUNTER supplements until after  surgery.Ascorbic Acid (VITAMIN C) 1000 MG tablet, Biotin 1000 MCG tablet  No Alcohol for 24 hours before or after surgery.  No Smoking including e-cigarettes for 24 hours prior to surgery.  No chewable tobacco products for at least 6 hours prior to surgery.  No nicotine patches on the day of surgery.  Do not use any "recreational" drugs for at least a week prior to your surgery.  Please be advised that the combination of cocaine and anesthesia may have negative outcomes, up to and including death. If you test positive for cocaine, your surgery will be cancelled.  On the morning of surgery brush your teeth with toothpaste and water, you may rinse your mouth with mouthwash if you wish. Do not swallow any toothpaste or mouthwash.  Do not wear jewelry, make-up, hairpins, clips or nail polish.  Do not wear lotions, powders, or perfumes.   Do not shave body from the neck down 48 hours prior to surgery just in case you cut yourself which could leave a site for infection.  Also, freshly shaved skin may become irritated if using the CHG soap.  Contact lenses, hearing aids and dentures may not be worn into surgery.  Do not bring valuables to the hospital. [facility] is not responsible for any missing/lost belongings or valuables.   Use CHG Soap or wipes as directed on instruction sheet.  Total Shoulder Arthroplasty:  use Benzolyl Peroxide 5% Gel as directed on instruction sheet.  Notify your doctor if there is any change in your medical condition (cold, fever, infection).  Wear comfortable clothing (specific to your surgery type) to the hospital.  Plan for stool softeners for home use; pain medications have a tendency to cause constipation. You can also help prevent constipation by eating foods high in fiber such as fruits and vegetables and drinking plenty of fluids as your diet allows.  After surgery, you can help prevent lung complications by doing breathing exercises.  Take deep  breaths and cough every 1-2 hours. Your doctor may order a device called an Incentive Spirometer to help you take deep breaths. When coughing or sneezing, hold a pillow firmly against your incision with both hands. This is called "splinting." Doing this helps protect your incision. It also decreases belly discomfort.  If you are being admitted to the hospital overnight, leave your suitcase in the car. After surgery it may be brought to your room.  If you are being discharged the day of surgery, you will not be allowed to drive home. You will need a responsible adult (18 years or older) to drive you home and stay with you that night.   If you are taking public transportation, you will need to have a responsible adult (18 years or older) with you. Please confirm with your physician that it is acceptable to use public transportation.   Please call the [facility] at [phone] if you have any questions about these instructions.  Visitation Policy:  Patients undergoing a surgery or procedure may have one family member or support person with them as long as that person is not COVID-19 positive or experiencing its symptoms.  That person may remain in the waiting area during the procedure.  Inpatient Visitation:    Visiting hours are 7 a.m. to 8 p.m. Patients will be allowed one visitor. The visitor may change daily. The visitor must pass COVID-19 screenings, use hand sanitizer when entering and exiting the patient's room and wear a mask at all times, including in the patient's room. Patients must also wear a mask when staff or their visitor are in the room. Masking is required regardless of vaccination status. Systemwide, no visitors 17 or younger.  Visitation Policy Changes: The following changes are to take effect on Feb. 28 at 7 a.m.  No visitors under the age of 51. Any visitor under the age of 92 must be accompanied by an adult. Adult inpatients: Two visitors will  be allowed daily and the visitors may change each day during the patient's stay. (Please note -- no changes at this time for the [facility] [place] [place] and inpatients, Emergency Departments, Ambulatory Sites, [facility], medical practices and procedural areas.)

## 2020-05-16 ENCOUNTER — Other Ambulatory Visit: Payer: Self-pay

## 2020-05-16 ENCOUNTER — Encounter
Admission: RE | Admit: 2020-05-16 | Discharge: 2020-05-16 | Disposition: A | Discharge status: Home / Self Care | Payer: Medicare HMO | Source: Ambulatory Visit | Attending: Orthopedic Surgery | Admitting: Orthopedic Surgery

## 2020-05-16 DIAGNOSIS — Z01818 Encounter for other preprocedural examination: Secondary | ICD-10-CM | POA: Diagnosis present

## 2020-05-16 DIAGNOSIS — Z20822 Contact with and (suspected) exposure to covid-19: Secondary | ICD-10-CM | POA: Diagnosis not present

## 2020-05-16 DIAGNOSIS — I10 Essential (primary) hypertension: Secondary | ICD-10-CM | POA: Diagnosis not present

## 2020-05-16 DIAGNOSIS — R54 Age-related physical debility: Secondary | ICD-10-CM | POA: Insufficient documentation

## 2020-05-16 LAB — CBC
HCT: 42.9 % (ref 36.0–46.0)
Hemoglobin: 13.6 g/dL (ref 12.0–15.0)
MCH: 26.9 pg (ref 26.0–34.0)
MCHC: 31.7 g/dL (ref 30.0–36.0)
MCV: 84.8 fL (ref 80.0–100.0)
Platelets: 258 10*3/uL (ref 150–400)
RBC: 5.06 MIL/uL (ref 3.87–5.11)
RDW: 12.6 % (ref 11.5–15.5)
WBC: 5.2 10*3/uL (ref 4.0–10.5)
nRBC: 0 % (ref 0.0–0.2)

## 2020-05-16 LAB — BASIC METABOLIC PANEL
Anion gap: 12 (ref 5–15)
BUN: 20 mg/dL (ref 8–23)
CO2: 27 mmol/L (ref 22–32)
Calcium: 9.7 mg/dL (ref 8.9–10.3)
Chloride: 99 mmol/L (ref 98–111)
Creatinine, Ser: 0.94 mg/dL (ref 0.44–1.00)
GFR, Estimated: >60 mL/min (ref >60)
Glucose, Bld: 77 mg/dL (ref 70–99)
Potassium: 3.7 mmol/L (ref 3.5–5.1)
Sodium: 138 mmol/L (ref 135–145)

## 2020-05-16 LAB — SURGICAL PCR SCREEN
MRSA, PCR: NEGATIVE
Staphylococcus aureus: NEGATIVE

## 2020-05-16 LAB — PROTIME-INR
INR: 1 (ref 0.8–1.2)
Prothrombin Time: 12.3 seconds (ref 11.4–15.2)

## 2020-05-16 LAB — SARS CORONAVIRUS 2 (TAT 6-24 HRS): SARS Coronavirus 2: NEGATIVE

## 2020-05-16 LAB — APTT: aPTT: 30 seconds (ref 24–36)

## 2020-05-19 MED ORDER — CHLORHEXIDINE GLUCONATE 0.12 % MT SOLN: 15.0000 mL | Freq: Once | OROMUCOSAL | Status: AC

## 2020-05-19 MED ORDER — ORAL CARE MOUTH RINSE: 15.0000 mL | Freq: Once | OROMUCOSAL | Status: AC

## 2020-05-19 MED ORDER — CEFAZOLIN SODIUM-DEXTROSE 2-4 GM/100ML-% IV SOLN
2.0000 g | INTRAVENOUS | Status: AC
  Administered 2020-05-20: 2 g via INTRAVENOUS

## 2020-05-19 MED ORDER — LACTATED RINGERS IV SOLN: INTRAVENOUS | Status: DC

## 2020-05-19 MED ORDER — FAMOTIDINE 20 MG PO TABS: 20.0000 mg | ORAL_TABLET | Freq: Once | ORAL | Status: AC

## 2020-05-20 ENCOUNTER — Encounter
Admission: RE | Disposition: A | Discharge status: Home / Self Care | Payer: Self-pay | Source: Home / Self Care | Attending: Orthopedic Surgery

## 2020-05-20 ENCOUNTER — Ambulatory Visit
Admission: RE | Admit: 2020-05-20 | Discharge: 2020-05-20 | Disposition: A | Discharge status: Home / Self Care | Payer: Medicare HMO | Attending: Orthopedic Surgery | Admitting: Orthopedic Surgery

## 2020-05-20 ENCOUNTER — Ambulatory Visit: Payer: Medicare HMO

## 2020-05-20 ENCOUNTER — Other Ambulatory Visit: Payer: Self-pay

## 2020-05-20 ENCOUNTER — Encounter: Payer: Self-pay | Admitting: Orthopedic Surgery

## 2020-05-20 ENCOUNTER — Ambulatory Visit: Payer: Medicare HMO | Admitting: Registered Nurse

## 2020-05-20 DIAGNOSIS — Z87891 Personal history of nicotine dependence: Secondary | ICD-10-CM | POA: Insufficient documentation

## 2020-05-20 DIAGNOSIS — M129 Arthropathy, unspecified: Secondary | ICD-10-CM | POA: Insufficient documentation

## 2020-05-20 DIAGNOSIS — M75121 Complete rotator cuff tear or rupture of right shoulder, not specified as traumatic: Secondary | ICD-10-CM | POA: Insufficient documentation

## 2020-05-20 DIAGNOSIS — Z96619 Presence of unspecified artificial shoulder joint: Secondary | ICD-10-CM

## 2020-05-20 DIAGNOSIS — M25511 Pain in right shoulder: Secondary | ICD-10-CM

## 2020-05-20 HISTORY — PX: REVERSE SHOULDER ARTHROPLASTY: SHX5054

## 2020-05-20 LAB — POCT I-STAT, CHEM 8
BUN: 21 mg/dL (ref 8–23)
Calcium, Ion: 1.3 mmol/L (ref 1.15–1.40)
Chloride: 103 mmol/L (ref 98–111)
Creatinine, Ser: 0.9 mg/dL (ref 0.44–1.00)
Glucose, Bld: 75 mg/dL (ref 70–99)
HCT: 35 % — ABNORMAL LOW (ref 36.0–46.0)
Hemoglobin: 11.9 g/dL — ABNORMAL LOW (ref 12.0–15.0)
Potassium: 3.9 mmol/L (ref 3.5–5.1)
Sodium: 142 mmol/L (ref 135–145)
TCO2: 27 mmol/L (ref 22–32)

## 2020-05-20 LAB — TYPE AND SCREEN
ABO/RH(D): B POS
Antibody Screen: NEGATIVE

## 2020-05-20 LAB — ABO/RH: ABO/RH(D): B POS

## 2020-05-20 SURGERY — ARTHROPLASTY, SHOULDER, TOTAL, REVERSE
Anesthesia: General | Site: Shoulder | Laterality: Right

## 2020-05-20 MED ORDER — DEXAMETHASONE SODIUM PHOSPHATE 10 MG/ML IJ SOLN
INTRAMUSCULAR | Status: AC
  Filled 2020-05-20: qty 1

## 2020-05-20 MED ORDER — CEFAZOLIN SODIUM-DEXTROSE 2-4 GM/100ML-% IV SOLN
2.0000 g | Freq: Once | INTRAVENOUS | Status: AC
  Administered 2020-05-20: 2 g via INTRAVENOUS

## 2020-05-20 MED ORDER — KETAMINE HCL 50 MG/5ML IJ SOSY
PREFILLED_SYRINGE | INTRAMUSCULAR | Status: AC
  Filled 2020-05-20: qty 5

## 2020-05-20 MED ORDER — MIDAZOLAM HCL 2 MG/2ML IJ SOLN
INTRAMUSCULAR | Status: AC
  Administered 2020-05-20: 1 mg via INTRAVENOUS
  Filled 2020-05-20: qty 2

## 2020-05-20 MED ORDER — FENTANYL CITRATE (PF) 100 MCG/2ML IJ SOLN: 50.0000 ug | Freq: Once | INTRAMUSCULAR | Status: AC

## 2020-05-20 MED ORDER — PROPOFOL 10 MG/ML IV BOLUS
INTRAVENOUS | Status: AC
  Filled 2020-05-20: qty 20

## 2020-05-20 MED ORDER — FENTANYL CITRATE (PF) 100 MCG/2ML IJ SOLN
INTRAMUSCULAR | Status: AC
  Administered 2020-05-20: 50 ug via INTRAVENOUS
  Filled 2020-05-20: qty 2

## 2020-05-20 MED ORDER — ROCURONIUM BROMIDE 10 MG/ML (PF) SYRINGE
PREFILLED_SYRINGE | INTRAVENOUS | Status: AC
  Filled 2020-05-20: qty 10

## 2020-05-20 MED ORDER — SODIUM CHLORIDE FLUSH 0.9 % IV SOLN
INTRAVENOUS | Status: AC
  Filled 2020-05-20: qty 40

## 2020-05-20 MED ORDER — FENTANYL CITRATE (PF) 100 MCG/2ML IJ SOLN
25.0000 ug | INTRAMUSCULAR | Status: DC | PRN
Start: 2020-05-20 — End: 2020-05-20

## 2020-05-20 MED ORDER — VANCOMYCIN HCL 1000 MG IV SOLR
INTRAVENOUS | Status: AC
  Filled 2020-05-20: qty 1000

## 2020-05-20 MED ORDER — BUPIVACAINE-EPINEPHRINE (PF) 0.25% -1:200000 IJ SOLN
INTRAMUSCULAR | Status: AC
  Filled 2020-05-20: qty 30

## 2020-05-20 MED ORDER — OXYCODONE HCL 5 MG PO TABS: 5.0000 mg | ORAL_TABLET | ORAL | 0 refills | Status: DC | PRN

## 2020-05-20 MED ORDER — ONDANSETRON HCL 4 MG/2ML IJ SOLN
INTRAMUSCULAR | Status: DC | PRN
  Administered 2020-05-20: 4 mg via INTRAVENOUS

## 2020-05-20 MED ORDER — CEFAZOLIN SODIUM-DEXTROSE 2-4 GM/100ML-% IV SOLN
INTRAVENOUS | Status: AC
  Filled 2020-05-20: qty 100

## 2020-05-20 MED ORDER — NEOMYCIN-POLYMYXIN B GU 40-200000 IR SOLN
Status: AC
  Filled 2020-05-20: qty 20

## 2020-05-20 MED ORDER — ONDANSETRON HCL 4 MG/2ML IJ SOLN
INTRAMUSCULAR | Status: AC
  Administered 2020-05-20: 4 mg via INTRAVENOUS
  Filled 2020-05-20: qty 2

## 2020-05-20 MED ORDER — TRANEXAMIC ACID-NACL 1000-0.7 MG/100ML-% IV SOLN
INTRAVENOUS | Status: AC
  Administered 2020-05-20: 1000 mg via INTRAVENOUS
  Filled 2020-05-20: qty 100

## 2020-05-20 MED ORDER — SUGAMMADEX SODIUM 200 MG/2ML IV SOLN
INTRAVENOUS | Status: DC | PRN
  Administered 2020-05-20: 200 mg via INTRAVENOUS

## 2020-05-20 MED ORDER — FENTANYL CITRATE (PF) 100 MCG/2ML IJ SOLN
INTRAMUSCULAR | Status: AC
  Filled 2020-05-20: qty 2

## 2020-05-20 MED ORDER — ACETAMINOPHEN 10 MG/ML IV SOLN
INTRAVENOUS | Status: AC
  Filled 2020-05-20: qty 100

## 2020-05-20 MED ORDER — MIDAZOLAM HCL 2 MG/2ML IJ SOLN: 1.0000 mg | Freq: Once | INTRAMUSCULAR | Status: AC

## 2020-05-20 MED ORDER — CHLORHEXIDINE GLUCONATE 0.12 % MT SOLN
OROMUCOSAL | Status: AC
  Administered 2020-05-20: 15 mL via OROMUCOSAL
  Filled 2020-05-20: qty 15

## 2020-05-20 MED ORDER — BUPIVACAINE LIPOSOME 1.3 % IJ SUSP
INTRAMUSCULAR | Status: AC
  Filled 2020-05-20: qty 20

## 2020-05-20 MED ORDER — TRANEXAMIC ACID-NACL 1000-0.7 MG/100ML-% IV SOLN
INTRAVENOUS | Status: DC | PRN
  Administered 2020-05-20: 1000 mg via INTRAVENOUS

## 2020-05-20 MED ORDER — EPHEDRINE 5 MG/ML INJ
INTRAVENOUS | Status: AC
  Filled 2020-05-20: qty 10

## 2020-05-20 MED ORDER — EPINEPHRINE PF 1 MG/ML IJ SOLN
INTRAMUSCULAR | Status: AC
  Filled 2020-05-20: qty 1

## 2020-05-20 MED ORDER — TRANEXAMIC ACID-NACL 1000-0.7 MG/100ML-% IV SOLN
INTRAVENOUS | Status: AC
  Filled 2020-05-20: qty 100

## 2020-05-20 MED ORDER — ROCURONIUM BROMIDE 100 MG/10ML IV SOLN
INTRAVENOUS | Status: DC | PRN
  Administered 2020-05-20 (×2): 10 mg via INTRAVENOUS
  Administered 2020-05-20: 50 mg via INTRAVENOUS

## 2020-05-20 MED ORDER — TRANEXAMIC ACID-NACL 1000-0.7 MG/100ML-% IV SOLN: 1000.0000 mg | INTRAVENOUS | Status: AC

## 2020-05-20 MED ORDER — ONDANSETRON 4 MG PO TBDP: 4.0000 mg | ORAL_TABLET | Freq: Three times a day (TID) | ORAL | 0 refills | Status: DC | PRN

## 2020-05-20 MED ORDER — SUCCINYLCHOLINE CHLORIDE 200 MG/10ML IV SOSY
PREFILLED_SYRINGE | INTRAVENOUS | Status: AC
  Filled 2020-05-20: qty 10

## 2020-05-20 MED ORDER — VANCOMYCIN HCL 1000 MG IV SOLR
INTRAVENOUS | Status: DC | PRN
  Administered 2020-05-20: 1000 mg

## 2020-05-20 MED ORDER — ACETAMINOPHEN 10 MG/ML IV SOLN
INTRAVENOUS | Status: DC | PRN
  Administered 2020-05-20: 500 mg via INTRAVENOUS

## 2020-05-20 MED ORDER — ONDANSETRON HCL 4 MG/2ML IJ SOLN
INTRAMUSCULAR | Status: AC
  Filled 2020-05-20: qty 2

## 2020-05-20 MED ORDER — EPHEDRINE SULFATE 50 MG/ML IJ SOLN
INTRAMUSCULAR | Status: DC | PRN
  Administered 2020-05-20 (×5): 5 mg via INTRAVENOUS
  Administered 2020-05-20 (×2): 10 mg via INTRAVENOUS

## 2020-05-20 MED ORDER — PHENYLEPHRINE HCL (PRESSORS) 10 MG/ML IV SOLN
INTRAVENOUS | Status: DC | PRN
  Administered 2020-05-20: 100 ug via INTRAVENOUS
  Administered 2020-05-20 (×2): 50 ug via INTRAVENOUS
  Administered 2020-05-20: 200 ug via INTRAVENOUS
  Administered 2020-05-20: 50 ug via INTRAVENOUS
  Administered 2020-05-20 (×2): 100 ug via INTRAVENOUS
  Administered 2020-05-20 (×3): 50 ug via INTRAVENOUS
  Administered 2020-05-20: 200 ug via INTRAVENOUS
  Administered 2020-05-20: 100 ug via INTRAVENOUS
  Administered 2020-05-20: 50 ug via INTRAVENOUS
  Administered 2020-05-20: 100 ug via INTRAVENOUS
  Administered 2020-05-20: 50 ug via INTRAVENOUS
  Administered 2020-05-20: 100 ug via INTRAVENOUS

## 2020-05-20 MED ORDER — LIDOCAINE HCL (CARDIAC) PF 100 MG/5ML IV SOSY
PREFILLED_SYRINGE | INTRAVENOUS | Status: DC | PRN
  Administered 2020-05-20: 60 mg via INTRAVENOUS

## 2020-05-20 MED ORDER — ONDANSETRON HCL 4 MG/2ML IJ SOLN: 4.0000 mg | Freq: Once | INTRAMUSCULAR | Status: AC | PRN

## 2020-05-20 MED ORDER — DEXMEDETOMIDINE HCL 200 MCG/2ML IV SOLN
INTRAVENOUS | Status: DC | PRN
  Administered 2020-05-20: 8 ug via INTRAVENOUS

## 2020-05-20 MED ORDER — FAMOTIDINE 20 MG PO TABS
ORAL_TABLET | ORAL | Status: AC
  Administered 2020-05-20: 20 mg via ORAL
  Filled 2020-05-20: qty 1

## 2020-05-20 MED ORDER — BUPIVACAINE HCL (PF) 0.5 % IJ SOLN
INTRAMUSCULAR | Status: AC
  Filled 2020-05-20: qty 10

## 2020-05-20 MED ORDER — PROPOFOL 10 MG/ML IV BOLUS
INTRAVENOUS | Status: DC | PRN
  Administered 2020-05-20: 90 mg via INTRAVENOUS

## 2020-05-20 MED ORDER — PHENYLEPHRINE HCL (PRESSORS) 10 MG/ML IV SOLN
INTRAVENOUS | Status: AC
  Filled 2020-05-20: qty 1

## 2020-05-20 MED ORDER — LIDOCAINE HCL (PF) 1 % IJ SOLN
INTRAMUSCULAR | Status: AC
  Filled 2020-05-20: qty 5

## 2020-05-20 MED ORDER — ACETAMINOPHEN 500 MG PO TABS: 1000.0000 mg | ORAL_TABLET | Freq: Three times a day (TID) | ORAL | 2 refills | Status: AC

## 2020-05-20 MED ORDER — ASPIRIN EC 325 MG PO TBEC: 325.0000 mg | DELAYED_RELEASE_TABLET | Freq: Every day | ORAL | 0 refills | Status: AC

## 2020-05-20 MED ORDER — DEXAMETHASONE SODIUM PHOSPHATE 10 MG/ML IJ SOLN
INTRAMUSCULAR | Status: DC | PRN
  Administered 2020-05-20: 6 mg via INTRAVENOUS

## 2020-05-20 SURGICAL SUPPLY — 91 items
ADH SKN CLS APL DERMABOND .7 (GAUZE/BANDAGES/DRESSINGS) ×1
APL PRP STRL LF DISP 70% ISPRP (MISCELLANEOUS) ×1
BASEPLATE P2 COATD GLND 6.5X30 (Shoulder) IMPLANT
BIT DRILL 2.5 DIA 127 CALI (BIT) ×1 IMPLANT
BIT DRILL 4 DIA CALIBRATED (BIT) ×1 IMPLANT
BIT DRILL GUIDE PATIENT MATCH (MISCELLANEOUS) IMPLANT
BLADE SAGITTAL WIDE XTHICK NO (BLADE) ×2 IMPLANT
BSPLAT GLND 30 STRL LF SHLDR (Shoulder) ×1 IMPLANT
CANISTER SUCT 1200ML W/VALVE (MISCELLANEOUS) ×2 IMPLANT
CANISTER SUCT 3000ML PPV (MISCELLANEOUS) ×4 IMPLANT
CHLORAPREP W/TINT 26 (MISCELLANEOUS) ×2 IMPLANT
CNTNR SPEC 2.5X3XGRAD LEK (MISCELLANEOUS) ×1
CONT SPEC 4OZ STER OR WHT (MISCELLANEOUS) ×1
CONT SPEC 4OZ STRL OR WHT (MISCELLANEOUS) ×1
CONTAINER SPEC 2.5X3XGRAD LEK (MISCELLANEOUS) ×1 IMPLANT
COOLER POLAR GLACIER W/PUMP (MISCELLANEOUS) ×1 IMPLANT
COVER BACK TABLE REUSABLE LG (DRAPES) ×2 IMPLANT
COVER WAND RF STERILE (DRAPES) ×2 IMPLANT
DERMABOND ADVANCED (GAUZE/BANDAGES/DRESSINGS) ×1
DERMABOND ADVANCED .7 DNX12 (GAUZE/BANDAGES/DRESSINGS) IMPLANT
DRAPE 3/4 80X56 (DRAPES) ×4 IMPLANT
DRAPE IMP U-DRAPE 54X76 (DRAPES) ×4 IMPLANT
DRAPE INCISE IOBAN 66X45 STRL (DRAPES) ×4 IMPLANT
DRAPE U-SHAPE 47X51 STRL (DRAPES) ×2 IMPLANT
DRILL GUIDE PATIENT MATCH (MISCELLANEOUS) ×2
DRSG OPSITE POSTOP 3X4 (GAUZE/BANDAGES/DRESSINGS) ×1 IMPLANT
DRSG OPSITE POSTOP 4X6 (GAUZE/BANDAGES/DRESSINGS) ×2 IMPLANT
DRSG OPSITE POSTOP 4X8 (GAUZE/BANDAGES/DRESSINGS) ×1 IMPLANT
DRSG TEGADERM 2-3/8X2-3/4 SM (GAUZE/BANDAGES/DRESSINGS) ×2 IMPLANT
DRSG TEGADERM 4X10 (GAUZE/BANDAGES/DRESSINGS) ×3 IMPLANT
ELECT REM PT RETURN 9FT ADLT (ELECTROSURGICAL) ×2
ELECTRODE REM PT RTRN 9FT ADLT (ELECTROSURGICAL) ×1 IMPLANT
GAUZE 4X4 16PLY RFD (DISPOSABLE) ×2 IMPLANT
GAUZE XEROFORM 1X8 LF (GAUZE/BANDAGES/DRESSINGS) ×2 IMPLANT
GLOVE SRG 8 PF TXTR STRL LF DI (GLOVE) ×2 IMPLANT
GLOVE SURG ORTHO LTX SZ8 (GLOVE) ×4 IMPLANT
GLOVE SURG SYN 8.0 (GLOVE) ×2 IMPLANT
GLOVE SURG SYN 8.0 PF PI (GLOVE) ×1 IMPLANT
GLOVE SURG UNDER POLY LF SZ8 (GLOVE) ×4
GOWN STRL REUS W/ TWL LRG LVL3 (GOWN DISPOSABLE) ×2 IMPLANT
GOWN STRL REUS W/ TWL XL LVL3 (GOWN DISPOSABLE) ×1 IMPLANT
GOWN STRL REUS W/TWL LRG LVL3 (GOWN DISPOSABLE) ×4
GOWN STRL REUS W/TWL XL LVL3 (GOWN DISPOSABLE) ×2
HEMOVAC 400CC 10FR (MISCELLANEOUS) ×2 IMPLANT
HOOD PEEL AWAY FLYTE STAYCOOL (MISCELLANEOUS) ×9 IMPLANT
INSERT SMALL SOCKET 32MM NEU (Insert) ×1 IMPLANT
KIT STABILIZATION SHOULDER (MISCELLANEOUS) ×2 IMPLANT
MANIFOLD NEPTUNE II (INSTRUMENTS) ×2 IMPLANT
MASK FACE SPIDER DISP (MASK) ×2 IMPLANT
MAT ABSORB  FLUID 56X50 GRAY (MISCELLANEOUS) ×4
MAT ABSORB FLUID 56X50 GRAY (MISCELLANEOUS) ×2 IMPLANT
NDL REVERSE CUT 1/2 CRC (NEEDLE) ×1 IMPLANT
NDL SPNL 20GX3.5 QUINCKE YW (NEEDLE) ×1 IMPLANT
NEEDLE REVERSE CUT 1/2 CRC (NEEDLE) ×4 IMPLANT
NEEDLE SPNL 20GX3.5 QUINCKE YW (NEEDLE) ×2 IMPLANT
NS IRRIG 1000ML POUR BTL (IV SOLUTION) ×2 IMPLANT
P2 COATDE GLNOID BSEPLT 6.5X30 (Shoulder) ×2 IMPLANT
PACK ARTHROSCOPY SHOULDER (MISCELLANEOUS) ×2 IMPLANT
PAD WRAPON POLAR SHDR XLG (MISCELLANEOUS) ×1 IMPLANT
PASSER SUT SWANSON 36MM LOOP (INSTRUMENTS) IMPLANT
PENCIL SMOKE EVACUATOR (MISCELLANEOUS) ×2 IMPLANT
PULSAVAC PLUS IRRIG FAN TIP (DISPOSABLE) ×2
RETRIEVER SUT HEWSON (MISCELLANEOUS) IMPLANT
SCREW BONE LOCKING RSP 5.0X14 (Screw) ×2 IMPLANT
SCREW BONE RSP LOCK 5X14 (Screw) IMPLANT
SCREW BONE RSP LOCK 5X22 (Screw) IMPLANT
SCREW BONE RSP LOCK 5X26 (Screw) IMPLANT
SCREW BONE RSP LOCKING 5.0X26 (Screw) ×2 IMPLANT
SCREW BONE RSP LOCKING 5.0X32 (Screw) ×2 IMPLANT
SCREW RETAIN W/HEAD 4MM OFFSET (Shoulder) ×1 IMPLANT
SLING ULTRA II LG (MISCELLANEOUS) ×1 IMPLANT
SLING ULTRA II M (MISCELLANEOUS) ×1 IMPLANT
SPONGE LAP 18X18 RF (DISPOSABLE) ×8 IMPLANT
STAPLER SKIN PROX 35W (STAPLE) IMPLANT
STEM HUMERAL REVERSE S 10X108 (Stem) ×1 IMPLANT
STRAP SAFETY 5IN WIDE (MISCELLANEOUS) ×4 IMPLANT
STRIP CLOSURE SKIN 1/2X4 (GAUZE/BANDAGES/DRESSINGS) ×2 IMPLANT
SUT FIBERWIRE #2 38 BLUE 1/2 (SUTURE) ×10
SUT MNCRL AB 4-0 PS2 18 (SUTURE) IMPLANT
SUT PROLENE 6 0 P 1 18 (SUTURE) ×2 IMPLANT
SUT TICRON 2-0 30IN 311381 (SUTURE) ×6 IMPLANT
SUT TIGER TAPE 7 IN WHITE (SUTURE) ×1 IMPLANT
SUT VIC AB 0 CT1 36 (SUTURE) ×2 IMPLANT
SUT VIC AB 2-0 CT2 27 (SUTURE) ×4 IMPLANT
SUTURE FIBERWR #2 38 BLUE 1/2 (SUTURE) ×4 IMPLANT
SYR 20ML LL LF (SYRINGE) ×2 IMPLANT
SYR 30ML LL (SYRINGE) ×4 IMPLANT
TIP FAN IRRIG PULSAVAC PLUS (DISPOSABLE) ×1 IMPLANT
TRAY FOLEY SLVR 16FR LF STAT (SET/KITS/TRAYS/PACK) IMPLANT
WIRE GUIDE 2.4 PATIENT MATCH (MISCELLANEOUS) ×1 IMPLANT
WRAPON POLAR PAD SHDR XLG (MISCELLANEOUS) ×2

## 2020-05-20 NOTE — Evaluation (Signed)
Physical Therapy Evaluation Patient Details Name: Danielle Stephens MRN: [id number] DOB: 08-13-1950 Today's Date: 05/20/2020   History of Present Illness  Pt is a 70 yo female s/p elective reverse R TSA. PMHx includes HTN, anxiety, and previous R shoulder arthroscopic surgeries.    Clinical Impression  Pt was pleasant and motivated to participate during the session and overall performed well with all functional tasks and training.  Pt was steady during transfers and followed cues well for scanning environment for obstacles to protect her RUE during transfers and gait.  Pt ambulated with a slow cadence and with short B step length but was steady with both amb on level ground and with ascending/descending steps with one rail. Pt will benefit from continued PT services per surgeon's recommendation upon discharge to safely address deficits listed in patient problem list for decreased caregiver assistance and eventual return to PLOF.      Follow Up Recommendations Follow surgeon's recommendation for DC plan and follow-up therapies    Equipment Recommendations  None recommended by PT    Recommendations for Other Services       Precautions / Restrictions Precautions Precautions: Shoulder Type of Shoulder Precautions: PROM shoulder flexion 0-90*, ER 0-30*, pendulums ok, AROM elbow/wrist/hand ok Shoulder Interventions: Shoulder sling/immobilizer;Shoulder abduction pillow;At all times;Off for dressing/bathing/exercises Precaution Booklet Issued: Yes (comment) (Provided by OT) Restrictions Weight Bearing Restrictions: Yes RUE Weight Bearing: Non weight bearing      Mobility  Bed Mobility               General bed mobility comments: deferred, in recliner at start and end of session    Transfers Overall transfer level: Needs assistance Equipment used: None Transfers: Sit to/from Stand Sit to Stand: Supervision         General transfer comment: Good control and stability with min  verbal cues for sequencing  Ambulation/Gait Ambulation/Gait assistance: Supervision Gait Distance (Feet): 80 Feet x 1, 40 Feet x 1 Assistive device: None Gait Pattern/deviations: Step-through pattern;Decreased step length - right;Decreased step length - left Gait velocity: decreased   General Gait Details: Slow cadence with short B step length but steady without LOB  Stairs Stairs: Yes Stairs assistance: Min guard Stair Management: One rail Left Number of Stairs: 4 General stair comments: Pt steady with good eccentric and concentric control  Wheelchair Mobility    Modified Rankin (Stroke Patients Only)       Balance Overall balance assessment: No apparent balance deficits (not formally assessed)                                           Pertinent Vitals/Pain Pain Assessment: No/denies pain    Home Living Family/patient expects to be discharged to:: Private residence Living Arrangements: Spouse/significant other Available Help at Discharge: Family;Available 24 hours/day Type of Home: House Home Access: Stairs to enter Entrance Stairs-Rails: Psychiatric nurse of Steps: 3 Home Layout: One level Home Equipment: None      Prior Function Level of Independence: Independent         Comments: Pt independent with amb without an AD community distances and active, no fall history, Ind with all ADLs     Hand Dominance   Dominant Hand: Right    Extremity/Trunk Assessment   Upper Extremity Assessment Upper Extremity Assessment: Defer to OT evaluation RUE Deficits / Details: impaired sensation, difficulty with AROM composite finger flexion/ext,  wrist ROM RUE: Unable to fully assess due to immobilization RUE Sensation: decreased proprioception;decreased light touch RUE Coordination: decreased fine motor;decreased gross motor    Lower Extremity Assessment Lower Extremity Assessment: Overall WFL for tasks assessed        Communication   Communication: No difficulties  Cognition Arousal/Alertness: Awake/alert Behavior During Therapy: WFL for tasks assessed/performed Overall Cognitive Status: Within Functional Limits for tasks assessed                                        General Comments      Exercises Total Joint Exercises Marching in Standing: AROM;Strengthening;Both;10 reps;Standing Other Exercises Other Exercises: Pt education provided on importance of scanning environment to prevent bumping RUE into obstacles Other Exercises: Pt/spouse education provided on proper RUE positioning with use of pillow to prevent shoulder extension   Assessment/Plan    PT Assessment Patient needs continued PT services  PT Problem List Decreased strength;Decreased range of motion;Decreased mobility       PT Treatment Interventions DME instruction;Gait training;Stair training;Functional mobility training;Therapeutic activities;Therapeutic exercise;Balance training;Patient/family education    PT Goals (Current goals can be found in the Care Plan section)  Acute Rehab PT Goals Patient Stated Goal: To be able to pick up my grandchild PT Goal Formulation: With patient Time For Goal Achievement: 06/02/20 Potential to Achieve Goals: Good    Frequency BID   Barriers to discharge        Co-evaluation               AM-PAC PT "6 Clicks" Mobility  Outcome Measure Help needed turning from your back to your side while in a flat bed without using bedrails?: A Little Help needed moving from lying on your back to sitting on the side of a flat bed without using bedrails?: A Little Help needed moving to and from a bed to a chair (including a wheelchair)?: A Little Help needed standing up from a chair using your arms (e.g., wheelchair or bedside chair)?: A Little Help needed to walk in hospital room?: A Little Help needed climbing 3-5 steps with a railing? : A Little 6 Click Score: 18    End  of Session Equipment Utilized During Treatment: Gait belt Activity Tolerance: Patient tolerated treatment well Patient left: in chair;with call bell/phone within reach;with family/visitor present Nurse Communication: Mobility status PT Visit Diagnosis: Muscle weakness (generalized) (M62.81)    Time: 0233-4356 PT Time Calculation (min) (ACUTE ONLY): 29 min   Charges:   PT Evaluation $PT Eval Moderate Complexity: 1 Mod PT Treatments $Therapeutic Activity: 8-22 mins        D. Royetta Asal PT, DPT 05/20/20, 4:07 PM

## 2020-05-20 NOTE — Discharge Instructions (Addendum)
Danielle Mulligan, MD  [facility]  Phone: [phone]  Fax: [phone]   Discharge Instructions after Reverse Shoulder Replacement   1. Activity/Sling: You are to be non-weight bearing on operative extremity. A sling/shoulder immobilizer has been provided for you. Only remove the sling to perform elbow, wrist, and hand RoM exercises and hygiene/dressing. Active reaching and lifting are not permitted. You will be given further instructions on sling use at your first physical therapy visit and postoperative visit with Dr. Posey Pronto.   2. Dressings: Dressing may be removed at 1st physical therapy visit (~3-4 days after surgery). Afterwards, you may either leave open to air (if no drainage) or cover with dry, sterile dressing. If you have steri-strips on your wound, please do not remove them. They will fall off on their own. You may shower 5 days after surgery. Please pat incision dry. Do not rub or place any shear forces across incision. If there is drainage or any opening of incision after 5 days, please notify our offices immediately.   3. Driving:  Plan on not driving for six weeks. Please note that you are advised NOT to drive while taking narcotic pain medications as you may be impaired and unsafe to drive.  4. Medications:  - You have been provided a prescription for narcotic pain medicine (usually oxycodone). After surgery, take 1-2 narcotic tablets every 4 hours if needed for severe pain. Please start this as soon as you begin to start having pain (if you received a nerve block, start taking as soon as this wears off).  - A prescription for anti-nausea medication will be provided in case the narcotic medicine causes nausea - take 1 tablet every 6 hours only if nauseated.  - Take enteric coated aspirin 325 mg once daily for 6 weeks to prevent blood clots. Do not take aspirin if you have an aspirin sensitivity/allergy or asthma or are on an anticoagulant (blood thinner) already. If so, then  your home anticoagulant will be resume and managed - do not take aspirin. -Take tylenol 1000mg  (2 Extra strength or 3 regular strength tablets) every 8 hours for pain. This will reduce the amount of narcotic medication needed. May stop tylenol when you are having minimal pain. - Take a stool softener (Colace, Dulcolax or Senakot) if you are using narcotic pain medications to help with constipation that is associated with narcotic use. - DO NOT take ANY nonsteroidal anti-inflammatory pain medications: Meloxicam, Advil, Motrin, Ibuprofen, Aleve, Naproxen, or Naprosyn.  If you are taking prescription medication for anxiety, depression, insomnia, muscle spasm, chronic pain, or for attention deficit disorder you are advised that you are at a higher risk of adverse effects with use of narcotics post-op, including narcotic addiction/dependence, depressed breathing, death. If you use non-prescribed substances: alcohol, marijuana, cocaine, heroin, methamphetamines, etc., you are at a higher risk of adverse effects with use of narcotics post-op, including narcotic addiction/dependence, depressed breathing, death. You are advised that taking > 50 morphine milligram equivalents (MME) of narcotic pain medication per day results in twice the risk of overdose or death. For your prescription provided: oxycodone 5 mg - taking more than 6 tablets per day after the first few days of surgery.  5. Physical Therapy: 1-2 times per week for ~12 weeks. Therapy typically starts on post operative Day 3 or 4. You have been provided an order for physical therapy. The therapist will provide home exercises. Please contact our offices if this appointment has not been scheduled.   6. Work: May  do light duty/desk job in approximately 2 weeks when off of narcotics, pain is well-controlled, and swelling has decreased if able to function with one arm in sling. Full work may take 6 weeks if light motions and function of both arms is  required. Lifting jobs may require 12 weeks.  7. Post-Op Appointments: Your first post-op appointment will be with Dr. Posey Pronto in approximately 2 weeks time.   If you find that they have not been scheduled please call the Orthopaedic Appointment front desk at [phone].         Danielle Mulligan, MD [facility] Phone: [phone] Fax: [phone]   REVERSE SHOULDER ARTHROPLASTY REHAB GUIDELINES   These guidelines should be tailored to individual patients based on their rehab goals, age, precautions, quality of repair, etc.  Progression should be based on patient progress and approval by the referring physician.  PHASE 1 - Day 1 through Week 2  GENERAL GUIDELINES AND PRECAUTIONS . Sling wear 24/7 except during grooming and home exercises (3 to 5 times daily) . Avoid shoulder extension such that the arm is posterior the frontal plane.  When patients recline, a pillow should be placed behind the upper arm and sling should be on.  They should be advised to always be able to see the elbow . Avoid combined IR/ADD/EXT, such as hand behind back to prevent dislocation . Avoid combined IR and ADD such as reaching across the chest to prevent dislocation . No AROM . No submersion in pool/water for 4 weeks . No weight bearing through operative arm (as in transfers, walker use, etc.)  GOALS . Maintain integrity of joint replacement; protect soft tissue healing . Increase PROM for elevation to 120 and ER to 30 (will remain the goal for first 6 weeks) . Optimize distal UE circulation and muscle activity (elbow, wrist and hand) . Instruct in use of sling for proper fit, polar care device for ice application after HEP, signs/symptoms of infection  EXERCISES . Active elbow, wrist and hand . Passive forward elevation in scapular plane to 90-120 max motion; ER in scapular plane to 30 . Active scapular retraction with arms resting in neutral position  CRITERIA TO PROGRESS TO PHASE  2 . Low pain (less than 3/10) with shoulder PROM . Healing of incision without signs of infection . Clearance by MD to advance after 2 week MD check up  PHASE 2 - 2 weeks - 6 weeks  GENERAL GUIDELINES AND PRECAUTIONS . Sling may be removed while at home; worn in community without abduction pillow . May use arm for light activities of daily living (such as feeding, brushing teeth, dressing.) with elbow near  the side of the body  and arm in front of the body- no active lifting of the arm . May submerge in water (tub, pool, [place], etc.) after 4 weeks . Continue to avoid WBing through the operative arm . Continue to avoid combined IR/EXT/ADD (hand behind the back) and IR/ADD  (reaching across chest) for dislocation precautions  GOALS .  Achieve passive elevation to 120 and ER to 30 .  Low (less than 3/10) to no pain .  Ability to fire all heads of the deltoid  EXERCISES . May discontinue grip, and active elbow and wrist exercises since using the arm in ADL's  with sling removed around the home . Continue passive elevation to 120 and ER to 30, both in scapular plane with arm supported on table top . Add submaximal isometrics, pain free effort, for  all functional heads of deltoid (anterior, posterior, middle)  Ensure that with posterior deltoid isometric the shoulder does not move into extension and the arm remains anterior the frontal plane . At 4 weeks:  begin to place arm in balanced position of 90 deg elevation in supine; when patient able to hold this position with ease, may begin reverse pendulums clockwise and counterclockwise  CRITERIA TO PROGRESS TO PHASE 3 . Passive forward elevation in scapular plane to 120; passive ER in scapular plane to 30 . Ability to fire isometrically all heads of the deltoid muscle without pain . Ability to place and hold the arm in balanced position (90 deg elevation in supine)  PHASE 3 - 6 weeks to 3 months  GENERAL GUIDELINES AND  PRECAUTIONS . Discontinue use of sling . Avoid forcing end range motion in any direction to prevent dislocation  . May advance use of the arm actively in ADL's without being restricted to arm by the side of the body, however, avoid heavy lifting and sports (forever!) . May initiate functional IR behind the back gently . NO UPPER BODY ERGOMETER   GOALS . Optimize PROM for elevation and ER in scapular plane with realistic expectation that max  mobility for elevation is usually around 145-160 passively; ER 40 to 50 passively; functional IR to L1 . Recover AROM to approach as close to PROM available as possible; may expect 135-150 deg active elevation; 30 deg active ER; active functional IR to L1 . Establish dynamic stability of the shoulder with deltoid and periscapular muscle gradual strengthening  EXERCISES . Forward elevation in scapular plane active progression: supine to incline, to vertical; short to long lever arm . Balanced position long lever arm AROM . Active ER/IR with arm at side . Scapular retraction with light band resistance . Functional IR with hand slide up back - very gentle and gradual . NO UPPER BODY ERGOMETER     CRITERIA TO PROGRESS TO PHASE 4 .  AROM equals/approaches PROM with good mechanics for elevation  .  No pain .  Higher level demand on shoulder than ADL functions   PHASE 4 12 months and beyond  [place] . No heavy lifting and no overhead sports . No heavy pushing activity . Gradually increase strength of deltoid and scapular stabilizers; also the rotator cuff if present with weights not to exceed 5 lbs . NO UPPER BODY ERGOMETER   GOALS .  Optimize functional use of the operative UE to meet the desired demands .  Gradual increase in deltoid, scapular muscle, and rotator cuff strength .  Pain free functional activities   EXERCISES . Add light hand weights for deltoid up to and not to exceed 3 lbs for anterior and posterior  with long arm lift against gravity; elbow bent to 90 deg for abduction in scapular plane . Theraband progression for extension to hip with scapular depression/retraction . Theraband progression for serratus anterior punches in supine; avoid wall, incline or prone pressups for serratus anterior . End range stretching gently without forceful overpressure in all planes (elevation in scapular plane, ER in scapular plane, functional IR) with stretching done for life as part of a daily routine . NO UPPER BODY ERGOMETER     CRITERIA FOR DISCHARGE FROM SKILLED PHYSICAL THERAPY .  Pain free AROM for shoulder elevation (expect around 135-150) .  Functional strength for all ADL's, work tasks, and hobbies approved by Psychologist, sport and exercise .  Independence with home maintenance program   NOTES:  1. With proper exercise, motion, strength, and function continue to improve even after one year. 2. The complication rate after surgery is 5 - 8%. Complications include infection, fracture, heterotopic bone formation, nerve injury, instability, rotator cuff tear, and tuberosity nonunion. Please look for clinical signs, unusual symptoms, or lack of progress with therapy and report those to Dr. Posey Pronto. Prefer more communication than less.  3. The therapy plan above only serves as a guide. Please be aware of specific individualized patient instructions as written on the prescription or through discussions with the surgeon. 4. Please call Dr. Posey Pronto if you have any specific questions or concerns [phone]     AMBULATORY SURGERY  DISCHARGE INSTRUCTIONS   1) The drugs that you were given will stay in your system until tomorrow so for the next 24 hours you should not:  A) Drive an automobile B) Make any legal decisions C) Drink any alcoholic beverage   2) You may resume regular meals tomorrow.  Today it is better to start with liquids and gradually work up to solid foods.  You may eat anything you prefer, but it is better  to start with liquids, then soup and crackers, and gradually work up to solid foods.   3) Please notify your doctor immediately if you have any unusual bleeding, trouble breathing, redness and pain at the surgery site, drainage, fever, or pain not relieved by medication.    4) Additional Instructions:        Please contact your physician with any problems or Same Day Surgery at [phone], Monday through Friday 6 am to 4 pm, or [place] at [facility] number at [phone].

## 2020-05-20 NOTE — Anesthesia Postprocedure Evaluation (Signed)
Anesthesia Post Note  Patient: Danielle Stephens  Procedure(s) Performed: Right reverse shoulder arthroplasty, biceps tenodesis (Right Shoulder)  Patient location during evaluation: PACU Anesthesia Type: General Level of consciousness: awake and alert Pain management: pain level controlled Vital Signs Assessment: post-procedure vital signs reviewed and stable Respiratory status: spontaneous breathing, nonlabored ventilation, respiratory function stable and patient connected to nasal cannula oxygen Cardiovascular status: blood pressure returned to baseline and stable Postop Assessment: no apparent nausea or vomiting Anesthetic complications: no   No complications documented.   Last Vitals:  Vitals:   05/20/20 1157 05/20/20 1200  BP: (!) 116/56 (!) 107/56  Pulse: 82 81  Resp: 14 16  Temp: (!) 36.1 C   SpO2: 100% 100%    Last Pain:  Vitals:   05/20/20 1157  TempSrc:   PainSc: 0-No pain                 Arita Miss

## 2020-05-20 NOTE — Op Note (Signed)
SURGERY DATE: 05/20/2020   PRE-OP DIAGNOSIS:  1. Right shoulder rotator cuff arthropathy   POST-OP DIAGNOSIS:  1. Right shoulder rotator cuff arthropathy   PROCEDURES:  1. Right reverse total shoulder arthroplasty  SURGEON: Cato Mulligan, MD  ASSISTANTS: Reche Dixon, PA   ANESTHESIA: Gen + interscalene block   ESTIMATED BLOOD LOSS: 250cc   TOTAL IV FLUIDS: per anesthesia record  IMPLANTS: [employer]: RSP Glenoid Head w/Retaining screw 32-4; Monoblock Reverse Shoulder Baseplate with 2.1FX central screw; 3 locking screws into baseplate (superior, posterior, inferior); Small Shell Humeral Stem 10 x 123m; Neutral Small Socket Insert;    INDICATION(S):  Danielle BOGIEis a 70y.o. female with a history of prior R rotator cuff repair, biceps tenodesis, SAD, and DCE on 03/06/19. She had postoperative adhesive capsulitis and then underwent a capsular release, lysis of adhesions, and manipulation under anesthesia on 08/28/19. She may have had a postoperative injury in October or November 2021. She had increased pain, weakness, and progressive difficulty with overhead motion since that time. She was currently unable to actively lift her arm over her head. Imaging was consistent with large, retracted rotator cuff tear at the musculotendinous junction that was deemed irreparable. Conservative measures including medications, physical therapy, and cortisone injections have not provided adequate relief. After discussion of risks, benefits, and alternatives to surgery, the patient elected to proceed with reverse shoulder arthroplasty.   OPERATIVE FINDINGS: large, retracted rotator cuff tear (complete supraspinatus, partial infraspinatus at musculotendinous junction) with prior biceps tenodesis   OPERATIVE REPORT:   I identified Danielle Stephens in the pre-operative holding area. Informed consent was obtained and the surgical site was marked. I reviewed the risks and benefits of the proposed surgical intervention  and the patient (and/or patient's guardian) wished to proceed. An interscalene block with Exparel was administered by the Anesthesia team. The patient was transferred to the operative suite and general anesthesia was administered. The patient was placed in the beach chair position with the head of the bed elevated approximately 45 degrees. All down side pressure points were appropriately padded. Pre-op exam under anesthesia confirmed some stiffness and crepitus. Appropriate IV antibiotics were administered. The extremity was then prepped and draped in standard fashion. A time out was performed confirming the correct extremity, correct patient, and correct procedure.   We used the standard deltopectoral incision from the coracoid to ~10cm distal. We found the cephalic vein and took it laterally. We opened the deltopectoral interval widely and placed retractors under the CA ligament in the subacromial space and under the deltoid tendon at its insertion. We then abducted and internally rotated the arm and released the underlying bursa between these retractors, taking care not to damage the circumflex branch of the axillary nerve.   Next, we brought the arm back in adduction at slight forward flexion with external rotation. We opened the clavipectoral fascia lateral to the conjoint tendon. We gently palpated the axillary nerve and verified its position and continuity on both sides of the humerus with a Tug test. This test was repeated multiple times during the procedure for nerve localization and confirmed to be intact at the end of the case. We then cauterized the anterior humeral circumflex ("Three sisters") vessels. The arm was then internally rotated, we cut the falciform ligament at approximately 1 cm of the upper portion of the pectoralis major insertion. Prior biceps tenodesis in the bicipital groove was visualized and noted to be intact.  At this point, we could see that  the supraspinatus and anterior  infraspinatus were torn with a bald humeral head superiorly. Tear was as the musculotendinous junction. Suture material and anchors were removed. We performed a subscapularis peel using electrocautery to remove the anterior capsule and subscapularis off of the humeral head. We released the inferior capsule from the humerus all the way to the posterior band of the inferior glenohumeral ligament. When this was complete we gently dislocated the shoulder up into the wound. We removed any osteophytes and made our cut with the appropriate inclination in 30 degrees of retroversion  We then turned our attention back to the glenoid. The proximal humerus was retracted posteriorly. The anterior capsule was dissected free from the subscapularis. The anterior capsule was then excised, exposing the anterior glenoid. We then grasped the labrum and removed it circumferentially. During the glenoid exposure, the axillary nerve was protected the entire time.    A patient-specific guide was used to drill the central guidepin. A tap was placed, matching the trajectory of the guidepin. An appropriately sized reamer was used to ream the glenoid. The monoblock baseplate was inserted and appropriate fixation was achieved. The 3 peripheral screws were drilled, measured, and placed. A 32-4 glenosphere was then placed and tightened.   We then turned our attention back to the humerus. We sized for a small shell prosthesis. We sequentially used larger diameter canal finders until we met significant resistance and sequential broaching was performed to this size listed above. We trialed both a 0 and +4 poly. The humerus was trialed and noted to have satisfactory stability, motion, and deltoid tension with a 0 poly. The trial implants were removed. The humeral canal was pulse lavaged. The superior aspect of the bicipital groove and lesser tuberosity were of poor bone quality and had to be removed. 3 drill holes were placed about the anterior  greater tuberosity and FiberWire sutures were passed through these holes for subscapularis repair. There was an additional region of the anterior most part of the greater tuberosity that was fractured. A FiberTape suture was placed around this and tied after implant was placed.  A fourth FiberWire was passed around the humeral implant. Next, the implant was placed with the appropriate retroversion. Stability was confirmed. We placed an 0 poly. The humerus was reduced and motion, tension, and stability were satisfactory. A Hemovac drain was placed. Subscapularis was repaired with the previously passed #2 FiberWire sutures after passing them through the subscapularis.   We again verified the tension on the axillary nerve, appropriate range of motion, stability of the implant, and security of the subscapularis repair. We closed the deltopectoral interval deep to the cephalic vein with a running, 0-Vicryl suture. The skin was closed with 2-0 Vicryl and 4-0 Monocryl. Xeroform and Honeycomb dressing was applied. A PolarCare unit and sling were placed. Patient was extubated, transferred to a stretcher bed and to the post antesthesia care unit in stable condition.   Of note, assistance from a PA was essential to performing the surgery.  PA was present for the entire surgery.  PA assisted with patient positioning, retraction, instrumentation, and wound closure. The surgery would have been more difficult and had longer operative time without PA assistance.    POSTOPERATIVE PLAN: The patient will be discharged home from PACU. Operative arm to remain in sling at all times except RoM exercises and hygiene. Can perform pendulums, elbow/wrist/hand RoM exercises. Passive RoM allowed to 90 FF and 30 ER. ASA 372m x 6 weeks for DVT ppx. Plan for  PT starting on POD #3-4. Patient to return to clinic in ~2 weeks for post-operative appointment.

## 2020-05-20 NOTE — Evaluation (Addendum)
Occupational Therapy Evaluation Patient Details Name: Danielle Stephens MRN: [id number] DOB: Oct 26, 1950 Today's Date: 05/20/2020    History of Present Illness 70yo female POD0 s/p reverse R TSA. PMHx includes HTN, anxiety, and previous R shoulder arthroscopic surgeries.   Clinical Impression   Patient was seen for an OT evaluation this date. Pt lives with her spouse in a 1 story home with 3 STE and wide bilateral rails. Prior to surgery, pt was active and independent and she is eager to return to PLOF with less pain and improved functional use of RUE. Pt reports being very familiar with polar care and shoulder sling/immobilizer from prior surgeries to R shoulder. Pt has orders for RUE to be immobilized and will be NWBing per MD. Patient presents with impaired strength/ROM, pain, and sensation to RUE. These impairments result in a decreased ability to perform self care tasks requiring PRN MIN A UB/LB dressing and bathing and MOD-MAX A for application of polar care, compression stockings, and sling/immobilizer. Pt/spouse instructed in polar care mgt, sling/immobilizer mgt, ROM exercises for RUE (with instructions for no shoulder exercises until full sensation has returned), RUE precautions, adaptive strategies for bathing/dressing/toileting/grooming, positioning and considerations for sleep, and home/routines modifications to maximize falls prevention, safety, and independence. Handout provided. Pt/spouse verbalized understanding. OT assessed polar care and sling/immobilizer positioning to maximize comfort, positioning for optimal healing, and to maximize skin integrity/safety. Pt/spouse verbalized understanding of all education/training provided. Do not anticipate need for additional acute OT. Recommend follow up therapies as scheduled by surgeon.      Follow Up Recommendations  Follow surgeon's recommendation for DC plan and follow-up therapies    Equipment Recommendations  None recommended by OT     Recommendations for Other Services       Precautions / Restrictions Precautions Precautions: Shoulder Type of Shoulder Precautions: PROM shoulder flexion 0-90*, ER 0-30*, pendulums ok, AROM elbow/wrist/hand ok Shoulder Interventions: Shoulder sling/immobilizer;Shoulder abduction pillow;At all times;Off for dressing/bathing/exercises Precaution Booklet Issued: Yes (comment) Restrictions Weight Bearing Restrictions: Yes RUE Weight Bearing: Non weight bearing      Mobility Bed Mobility               General bed mobility comments: deferred, in recliner at start and end of session    Transfers                      Balance                                           ADL either performed or assessed with clinical judgement   ADL Overall ADL's : Needs assistance/impaired                                       General ADL Comments: Pt will require PRN Min A for UB/LB bathing and dressing, MOD-MAX A for polar care mgt and shoulder sling mgt; spouse able to provide     Vision Patient Visual Report: No change from baseline       Perception     Praxis      Pertinent Vitals/Pain Pain Assessment: No/denies pain     Hand Dominance Right   Extremity/Trunk Assessment Upper Extremity Assessment Upper Extremity Assessment: RUE deficits/detail RUE Deficits / Details: impaired sensation, difficulty with AROM composite finger  flexion/ext, wrist ROM RUE: Unable to fully assess due to immobilization RUE Sensation: decreased proprioception;decreased light touch RUE Coordination: decreased fine motor;decreased gross motor   Lower Extremity Assessment Lower Extremity Assessment: Overall WFL for tasks assessed       Communication Communication Communication: No difficulties   Cognition Arousal/Alertness: Awake/alert Behavior During Therapy: WFL for tasks assessed/performed Overall Cognitive Status: Within Functional Limits for  tasks assessed                                     General Comments       Exercises Other Exercises Other Exercises: Pt/spouse instructed in shoulder precautions and how to maintain during ADL, self care skills, AE/DME, polar care mgt, shoulder immobilizer/sling mgt, AROM/PROM ex, and falls prevention; handout provided   Shoulder Instructions      Home Living Family/patient expects to be discharged to:: Private residence Living Arrangements: Spouse/significant other Available Help at Discharge: Family;Available 24 hours/day Type of Home: House Home Access: Stairs to enter [employer] of Steps: 3 Entrance Stairs-Rails: Right;Left Home Layout: One level     Bathroom Shower/Tub: Occupational psychologist: Handicapped height                Prior Functioning/Environment Level of Independence: Independent        Comments: Pt independent and active        OT Problem List: Decreased range of motion;Impaired sensation;Impaired UE functional use;Decreased strength;Decreased coordination      OT Treatment/Interventions:      OT Goals(Current goals can be found in the care plan section) Acute Rehab OT Goals Patient Stated Goal: go home and recover OT Goal Formulation: Patient unable to participate in goal setting  OT Frequency:     Barriers to D/C:            Co-evaluation              AM-PAC OT "6 Clicks" Daily Activity     Outcome Measure Help from another person eating meals?: A Little Help from another person taking care of personal grooming?: A Little Help from another person toileting, which includes using toliet, bedpan, or urinal?: A Little Help from another person bathing (including washing, rinsing, drying)?: A Little Help from another person to put on and taking off regular upper body clothing?: A Little Help from another person to put on and taking off regular lower body clothing?: A Little 6 Click Score: 18    End of Session Nurse Communication: Mobility status  Activity Tolerance: Patient tolerated treatment well Patient left: in chair;with call bell/phone within reach;with family/visitor present;with nursing/sitter in room  OT Visit Diagnosis: Other abnormalities of gait and mobility (R26.89)                Time: 2951-8841 OT Time Calculation (min): 17 min Charges:  OT General Charges $OT Visit: 1 Visit OT Evaluation $OT Eval Moderate Complexity: 1 Mod OT Treatments $Self Care/Home Management : 8-22 mins  Hanley Hays, MPH, MS, OTR/L ascom [phone] 05/20/20, 3:26 PM

## 2020-05-20 NOTE — Anesthesia Preprocedure Evaluation (Addendum)
Anesthesia Evaluation  Patient identified by MRN, date of birth, ID band Patient awake    Reviewed: Allergy & Precautions, NPO status , Patient's Chart, lab work & pertinent test results  History of Anesthesia Complications Negative for: history of anesthetic complications  Airway Mallampati: II       Dental   Pulmonary neg sleep apnea, neg COPD, Not current smoker,           Cardiovascular hypertension, Pt. on medications (-) Past MI and (-) CHF (-) dysrhythmias (-) Valvular Problems/Murmurs     Neuro/Psych neg Seizures Anxiety    GI/Hepatic Neg liver ROS, neg GERD  ,  Endo/Other  neg diabetes  Renal/GU Renal InsufficiencyRenal disease     Musculoskeletal   Abdominal   Peds  Hematology   Anesthesia Other Findings   Reproductive/Obstetrics                            Anesthesia Physical Anesthesia Plan  ASA: II  Anesthesia Plan: General   Post-op Pain Management: GA combined w/ Regional for post-op pain   Induction: Intravenous  PONV Risk Score and Plan: 3 and Ondansetron and Dexamethasone  Airway Management Planned: Oral ETT  Additional Equipment:   Intra-op Plan:   Post-operative Plan:   Informed Consent: I have reviewed the patients History and Physical, chart, labs and discussed the procedure including the risks, benefits and alternatives for the proposed anesthesia with the patient or authorized representative who has indicated his/her understanding and acceptance.       Plan Discussed with:   Anesthesia Plan Comments:         Anesthesia Quick Evaluation

## 2020-05-20 NOTE — Anesthesia Procedure Notes (Signed)
Procedure Name: Intubation Date/Time: 05/20/2020 7:48 AM Performed by: Lia Foyer, CRNA Pre-anesthesia Checklist: Patient identified, Emergency Drugs available, Suction available and Patient being monitored Patient Re-evaluated:Patient Re-evaluated prior to induction Oxygen Delivery Method: Circle system utilized Preoxygenation: Pre-oxygenation with 100% oxygen Induction Type: IV induction Ventilation: Mask ventilation without difficulty Laryngoscope Size: McGraph and 3 Grade View: Grade I Tube type: Oral Tube size: 6.5 mm Number of attempts: 1 Airway Equipment and Method: Stylet and Video-laryngoscopy Placement Confirmation: ETT inserted through vocal cords under direct vision,  positive ETCO2 and breath sounds checked- equal and bilateral Secured at: 18 cm Tube secured with: Tape Dental Injury: Teeth and Oropharynx as per pre-operative assessment

## 2020-05-20 NOTE — Transfer of Care (Signed)
Immediate Anesthesia Transfer of Care Note  Patient: Danielle Stephens Name  Procedure(s) Performed: Right reverse shoulder arthroplasty, biceps tenodesis (Right Shoulder)  Patient Location: PACU  Anesthesia Type:General  Level of Consciousness: drowsy and patient cooperative  Airway & Oxygen Therapy: Patient Spontanous Breathing and Patient connected to face mask oxygen  Post-op Assessment: Report given to RN and Post -op Vital signs reviewed and stable  Post vital signs: Reviewed and stable  Last Vitals:  Vitals Value Taken Time  BP 136/69 05/20/20 1110  Temp    Pulse 100 05/20/20 1112  Resp 16 05/20/20 1112  SpO2 100 % 05/20/20 1112  Vitals shown include unvalidated device data.  Last Pain:  Vitals:   05/20/20 0730  TempSrc:   PainSc: 0-No pain         Complications: No complications documented.

## 2020-05-20 NOTE — H&P (Signed)
Paper H&P to be scanned into permanent record. H&P reviewed. No significant changes noted.  

## 2020-05-21 ENCOUNTER — Encounter: Payer: Self-pay | Admitting: Orthopedic Surgery

## 2020-05-21 LAB — SURGICAL PATHOLOGY

## 2020-05-21 MED ORDER — BUPIVACAINE LIPOSOME 1.3 % IJ SUSP
INTRAMUSCULAR | Status: DC | PRN
  Administered 2020-05-20: 20 mL via PERINEURAL

## 2020-05-21 MED ORDER — BUPIVACAINE HCL (PF) 0.5 % IJ SOLN
INTRAMUSCULAR | Status: DC | PRN
  Administered 2020-05-20: 10 mL via PERINEURAL

## 2020-05-21 MED ORDER — LIDOCAINE HCL (PF) 1 % IJ SOLN
INTRAMUSCULAR | Status: DC | PRN
  Administered 2020-05-20: .8 mL

## 2020-05-21 NOTE — Addendum Note (Signed)
Addendum  created 05/21/20 1237 by Gunnar Fusi, MD   Child order released for a procedure order, Clinical Note Signed, Intraprocedure Blocks edited, Intraprocedure Meds edited

## 2020-05-21 NOTE — Anesthesia Procedure Notes (Signed)
Anesthesia Regional Block: Interscalene brachial plexus block   Pre-Anesthetic Checklist: ,, timeout performed, Correct Patient, Correct Site, Correct Laterality, Correct Procedure, Correct Position, site marked, Risks and benefits discussed,  Surgical consent,  Pre-op evaluation,  At surgeon's request and post-op pain management  Laterality: Right  Prep: chloraprep       Needles:  Injection technique: Single-shot  Needle Type: Echogenic Stimulator Needle     Needle Length: 10cm  Needle Gauge: 22     Additional Needles:   Procedures:,,,, ultrasound used (permanent image in chart),,,,   Nerve Stimulator or Paresthesia:  Response: 0.8 mA,   Additional Responses:   Narrative:  Start time: 05/20/2020 7:21 AM End time: 05/20/2020 7:30 AM  Performed by: Personally  Anesthesiologist: Molli Barrows, MD  Additional Notes: Pt. Identified and accepting of procedure after risks and benefits fully reviewed and questions answered. Time out performed and laterality confirmed prior to procedure.  ISNB  performed without difficulty and well tolerated.  Neg IV and SATD.  No pain on injection of Local anesthetic and VSST.

## 2020-09-20 ENCOUNTER — Ambulatory Visit: Payer: Self-pay | Admitting: *Deleted

## 2020-09-20 ENCOUNTER — Ambulatory Visit: Payer: Medicare HMO | Admitting: Nurse Practitioner

## 2020-09-20 NOTE — Telephone Encounter (Signed)
Reason for Disposition  [1] MILD dizziness (e.g., walking normally) AND [2] has NOT been evaluated by physician for this  (Exception: dizziness caused by heat exposure, sudden standing, or poor fluid intake)  Answer Assessment - Initial Assessment Questions 1. DESCRIPTION: "Describe your dizziness."     Lightheaded, sweating feeling faint 2. LIGHTHEADED: "Do you feel lightheaded?" (e.g., somewhat faint, woozy, weak upon standing)     Yes but not now 3. VERTIGO: "Do you feel like either you or the room is spinning or tilting?" (i.e. vertigo)     No  4. SEVERITY: "How bad is it?"  "Do you feel like you are going to faint?" "Can you stand and walk?"   - MILD: Feels slightly dizzy, but walking normally.   - MODERATE: Feels unsteady when walking, but not falling; interferes with normal activities (e.g., school, work).   - SEVERE: Unable to walk without falling, or requires assistance to walk without falling; feels like passing out now.      Mild  5. ONSET:  "When did the dizziness begin?"     This am  6. AGGRAVATING FACTORS: "Does anything make it worse?" (e.g., standing, change in head position)     No  7. HEART RATE: "Can you tell me your heart rate?" "How many beats in 15 seconds?"  (Note: not all patients can do this)       na 8. CAUSE: "What do you think is causing the dizziness?"     Not sure . Took day quil for sorethroat  9. RECURRENT SYMPTOM: "Have you had dizziness before?" If Yes, ask: "When was the last time?" "What happened that time?"     Yes . 10. OTHER SYMPTOMS: "Do you have any other symptoms?" (e.g., fever, chest pain, vomiting, diarrhea, bleeding)       Sore throat, sweating , nausea, feeling faint  11. PREGNANCY: "Is there any chance you are pregnant?" "When was your last menstrual period?"       na  Protocols used: Dizziness - Lightheadedness-A-AH

## 2020-09-20 NOTE — Telephone Encounter (Signed)
Scheduled virtual today with Ander Purpura

## 2020-09-20 NOTE — Telephone Encounter (Signed)
Please advise 

## 2020-09-20 NOTE — Telephone Encounter (Signed)
noted 

## 2020-09-20 NOTE — Telephone Encounter (Signed)
Patient's husband called to report episode of patient c/o nausea and feeling like she could faint. Patient c/o sore throat yesterday and has been taking nyquil. Took dayquil this am . 5-10 minutes prior to call patient called husband to assist her due to feeling nausea, sweating, feeling faint and dizziness. Patient's husband reports patient looked a "little glassy eyed" but then was ok. Patient denies dizziness , sweating , feeling nausea or faint at this time. Denies chest pain, difficulty breathing, fever. Denies headache, diarrhea, vomiting. Patient's husband reports patient has had these episodes of feeling faint in the past. Has not seen provider to assess issues of passing out. Instructed patient to give patient fluids and husband reports patient does not drink fluids as she should. Patient reprots she is urinating. Appt scheduled 09/23/20. Care advise given to patient and husband. Patient 's husband verbalized understanding of care advise and to call back or go to ED or call 911 if symptoms occur again or worsen.

## 2020-09-23 ENCOUNTER — Encounter: Payer: Self-pay | Admitting: Internal Medicine

## 2020-09-23 ENCOUNTER — Other Ambulatory Visit: Payer: Self-pay

## 2020-09-23 ENCOUNTER — Telehealth (INDEPENDENT_AMBULATORY_CARE_PROVIDER_SITE_OTHER): Payer: Medicare HMO | Admitting: Internal Medicine

## 2020-09-23 ENCOUNTER — Ambulatory Visit: Payer: Self-pay | Admitting: Nurse Practitioner

## 2020-09-23 ENCOUNTER — Telehealth: Payer: Self-pay

## 2020-09-23 DIAGNOSIS — U071 COVID-19: Secondary | ICD-10-CM | POA: Diagnosis not present

## 2020-09-23 MED ORDER — FEXOFENADINE HCL 180 MG PO TABS: 180.0000 mg | ORAL_TABLET | Freq: Every day | ORAL | 1 refills | Status: DC

## 2020-09-23 NOTE — Telephone Encounter (Signed)
Schedule pt with Dr Neomia Dear as Henrine Screws doesn't have any openings today   Copied from [place] [id number]. Topic: General - Other >> Sep 23, 2020  8:49 AM Leward Quan A wrote: Reason for CRM: Patient called in to inform Marnee Guarneri that she had a positive Covid test even though her symptoms are mild. Symptoms include sore throat, cough, sinus congestion runny nose and chills. Say that she would like something sent to the pharmacy if possible but would like a call back first. Ph#  [phone]

## 2020-09-23 NOTE — Telephone Encounter (Signed)
Pt was already seen by Dr Neomia Dear this morning

## 2020-09-23 NOTE — Progress Notes (Signed)
There were no vitals taken for this visit.   Subjective:    Patient ID: Danielle Stephens, female    DOB: 29-May-1950, 70 y.o.   MRN: [id number]  Chief Complaint  Patient presents with   Covid Positive    Tested positive yesterday, having sore throat, cough, sinus congestion, runny nose and chills.     HPI: Danielle Stephens is a 70 y.o. female   This visit was completed via telephone due to the restrictions of the COVID-19 pandemic. All issues as above were discussed and addressed but no physical exam was performed. If it was felt that the patient should be evaluated in the office, they were directed there. The patient verbally consented to this visit. Patient was unable to complete an audio/visual visit due to Technical difficulties. Due to the catastrophic nature of the COVID-19 pandemic, this visit was done through audio contact only. Location of the patient: home Location of the provider: work Those involved with this call:  Provider: Charlynne Cousins, MD CMA: Frazier Butt, Jeannette Desk/Registration: Jill Side  Time spent on call: 10 minutes on the phone discussing health concerns. 10 minutes total spent in review of patient's record and preparation of their chart.    URI  This is a new problem. The current episode started in the past 7 days. The problem has been gradually improving. There has been no fever. Associated symptoms include coughing, nausea and rhinorrhea. Pertinent negatives include no congestion, diarrhea, dysuria, ear pain, headaches, sinus pain, sneezing, sore throat or swollen glands. Associated symptoms comments: Fatigue++  Keeping a mask on per pt.  Denies sob  Some sore throat.  Runny nose  .   Chief Complaint  Patient presents with   Covid Positive    Tested positive yesterday, having sore throat, cough, sinus congestion, runny nose and chills.     Relevant past medical, surgical, family and social history reviewed and updated as indicated. Interim medical  history since our last visit reviewed. Allergies and medications reviewed and updated.  Review of Systems  HENT:  Positive for rhinorrhea. Negative for congestion, ear pain, sinus pain, sneezing and sore throat.   Respiratory:  Positive for cough.   Gastrointestinal:  Positive for nausea. Negative for diarrhea.  Genitourinary:  Negative for dysuria.  Neurological:  Negative for headaches.   Per HPI unless specifically indicated above     Objective:    There were no vitals taken for this visit.  Wt Readings from Last 3 Encounters:  05/20/20 103 lb (46.7 kg)  04/10/20 105 lb (47.6 kg)  03/27/20 102 lb (46.3 kg)    Physical Exam Unable to peform sec to virtual visit.   Results for orders placed or performed during the hospital encounter of 05/20/20  I-STAT, chem 8  Result Value Ref Range   Sodium 142 135 - 145 mmol/L   Potassium 3.9 3.5 - 5.1 mmol/L   Chloride 103 98 - 111 mmol/L   BUN 21 8 - 23 mg/dL   Creatinine, Ser 0.90 0.44 - 1.00 mg/dL   Glucose, Bld 75 70 - 99 mg/dL   Calcium, Ion 1.30 1.15 - 1.40 mmol/L   TCO2 27 22 - 32 mmol/L   Hemoglobin 11.9 (L) 12.0 - 15.0 g/dL   HCT 35.0 (L) 36.0 - 46.0 %  Type and screen [facility]  Result Value Ref Range   ABO/RH(D) B POS    Antibody Screen NEG    Sample Expiration  05/23/2020,2359 Performed at [facility], [address], [place], [place] [address]   ABO/Rh  Result Value Ref Range   ABO/RH(D)      B POS Performed at [facility], [address] [place], [place] [address]   Surgical pathology  Result Value Ref Range   SURGICAL PATHOLOGY      SURGICAL PATHOLOGY CASE: [phone] PATIENT: [place] Balbuena Surgical Pathology Report     Specimen Submitted: A. 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 HUMERAL HEAD, RIGHT; ARTHROPLASTY: - DEGENERATIVE OSTEOARTHROPATHY. - NEGATIVE FOR  MALIGNANCY.   GROSS DESCRIPTION: A. Labeled: Right humeral head Received: Formalin Collection time: 9:30 AM on 05/20/2020 Placed into formalin time: 10:31 AM on 05/20/2020 Size of specimen:      Head -4.6 x 4.2 x 2.9 cm      Neck -not grossly appreciated.      Additional tissue: Not grossly appreciated. Articular surface: The articular surface is tan-red with a 2.2 x 1.6 cm area of roughening. Cut surface: The cut surface is tan-yellow and firm. Other findings: At the resection margin there is a 3.9 x 3.5 x 2 cm central cavity.  Block summary: 1 - representative sections of roughened cartilage to adjacent bone and cavity, following decalcification  T issue decalcification: Yes, 1 cassette  RB 05/20/2020  Final Diagnosis performed by Betsy Pries, MD.   Electronically signed 05/21/2020 9:32:07AM The electronic signature indicates that the named Attending Pathologist has evaluated the specimen Technical component performed at [facility], [address] [place], [place] [address] Lab: [id number] Dir: Rush Farmer, MD, MMM  Professional component performed at [facility], [facility], [place], [place],  [address] Lab: [phone] Dir: Dellia Nims. Rubinas, MD         Current Outpatient Medications:    acetaminophen (TYLENOL) 500 MG tablet, Take 1,000 mg by mouth every 6 (six) hours as needed for mild pain or moderate pain., Disp: , Rfl:    acetaminophen (TYLENOL) 500 MG tablet, Take 2 tablets (1,000 mg total) by mouth every 8 (eight) hours., Disp: 90 tablet, Rfl: 2   Ascorbic Acid (VITAMIN C) 1000 MG tablet, Take 1,000 mg by mouth daily., Disp: , Rfl:    Biotin 1000 MCG tablet, Take 1,000 mcg by mouth daily., Disp: , Rfl:    Cholecalciferol (VITAMIN D) 50 MCG (2000 UT) CAPS, Take 2,000 Units by mouth daily. gummie, Disp: , Rfl:    doxylamine, Sleep, (SLEEP AID) 25 MG tablet, Take 25 mg by mouth at bedtime as needed for sleep., Disp: , Rfl:    lisinopril  (ZESTRIL) 20 MG tablet, Take 1 tablet (20 mg total) by mouth daily., Disp: 90 tablet, Rfl: 4   methocarbamol (ROBAXIN) 500 MG tablet, Take 1 tablet (500 mg total) by mouth every 6 (six) hours as needed for muscle spasms. (Patient not taking: Reported on 09/23/2020), Disp: 60 tablet, Rfl: 0   Multiple Vitamin (MULTIVITAMIN) capsule, Take 1 capsule by mouth daily. (Patient not taking: Reported on 09/23/2020), Disp: , Rfl:    ondansetron (ZOFRAN ODT) 4 MG disintegrating tablet, Take 1 tablet (4 mg total) by mouth every 8 (eight) hours as needed for nausea or vomiting. (Patient not taking: Reported on 09/23/2020), Disp: 20 tablet, Rfl: 0   oxyCODONE (ROXICODONE) 5 MG immediate release tablet, Take 1-2 tablets (5-10 mg total) by mouth every 4 (four) hours as needed (pain). (Patient not taking: Reported on 09/23/2020), Disp: 30  tablet, Rfl: 0    Assessment & Plan:  COVID : positive :  Increase fluid intake.  Headahce - tyelnol every 4-6 hrs prn and alternate this with ibubrufen 800 mg q 8 hrly. Sinus pressure: use steam inhalation.  OTC -  Allegra / claritin.  5 days quarantine.  Ok to rtw in 5 days if tests -ve follow.   Problem List Items Addressed This Visit   None  No orders of the defined types were placed in this encounter.  Meds ordered this encounter  Medications   fexofenadine (ALLEGRA ALLERGY) 180 MG tablet    Sig: Take 1 tablet (180 mg total) by mouth daily.    Dispense:  10 tablet    Refill:  1    Follow up plan: No follow-ups on file.

## 2020-10-28 ENCOUNTER — Ambulatory Visit (INDEPENDENT_AMBULATORY_CARE_PROVIDER_SITE_OTHER): Payer: Medicare HMO

## 2020-10-28 VITALS — BP 114/71 | HR 76 | Temp 96.0°F | Ht 61.0 in | Wt 101.0 lb

## 2020-10-28 DIAGNOSIS — Z0000 Encounter for general adult medical examination without abnormal findings: Secondary | ICD-10-CM | POA: Diagnosis not present

## 2020-10-28 NOTE — Patient Instructions (Signed)
Danielle Stephens , Thank you for taking time to come for your Medicare Wellness Visit. I appreciate your ongoing commitment to your health goals. Please review the following plan we discussed and let me know if I can assist you in the future.   Screening recommendations/referrals: Colonoscopy: due Mammogram: completed 12/07/2019 Bone Density: completed 12/07/2019 Recommended yearly ophthalmology/optometry visit for glaucoma screening and checkup Recommended yearly dental visit for hygiene and checkup  Vaccinations: Influenza vaccine: due Pneumococcal vaccine: completed 04/11/2020 Tdap vaccine: due Shingles vaccine: needs second dose   Covid-19:06/07/2019, 05/12/2019  Advanced directives: Advance directive discussed with you today.   Conditions/risks identified: none  Next appointment: Follow up in one year for your annual wellness visit    Preventive Care 65 Years and Older, Female Preventive care refers to lifestyle choices and visits with your health care provider that can promote health and wellness. What does preventive care include? A yearly physical exam. This is also called an annual well check. Dental exams once or twice a year. Routine eye exams. Ask your health care provider how often you should have your eyes checked. Personal lifestyle choices, including: Daily care of your teeth and gums. Regular physical activity. Eating a healthy diet. Avoiding tobacco and drug use. Limiting alcohol use. Practicing safe sex. Taking low-dose aspirin every day. Taking vitamin and mineral supplements as recommended by your health care provider. What happens during an annual well check? The services and screenings done by your health care provider during your annual well check will depend on your age, overall health, lifestyle risk factors, and family history of disease. Counseling  Your health care provider may ask you questions about your: Alcohol use. Tobacco use. Drug use. Emotional  well-being. Home and relationship well-being. Sexual activity. Eating habits. History of falls. Memory and ability to understand (cognition). Work and work Statistician. Reproductive health. Screening  You may have the following tests or measurements: Height, weight, and BMI. Blood pressure. Lipid and cholesterol levels. These may be checked every 5 years, or more frequently if you are over 57 years old. Skin check. Lung cancer screening. You may have this screening every year starting at age 8 if you have a 30-pack-year history of smoking and currently smoke or have quit within the past 15 years. Fecal occult blood test (FOBT) of the stool. You may have this test every year starting at age 67. Flexible sigmoidoscopy or colonoscopy. You may have a sigmoidoscopy every 5 years or a colonoscopy every 10 years starting at age 1. Hepatitis C blood test. Hepatitis B blood test. Sexually transmitted disease (STD) testing. Diabetes screening. This is done by checking your blood sugar (glucose) after you have not eaten for a while (fasting). You may have this done every 1-3 years. Bone density scan. This is done to screen for osteoporosis. You may have this done starting at age 34. Mammogram. This may be done every 1-2 years. Talk to your health care provider about how often you should have regular mammograms. Talk with your health care provider about your test results, treatment options, and if necessary, the need for more tests. Vaccines  Your health care provider may recommend certain vaccines, such as: Influenza vaccine. This is recommended every year. Tetanus, diphtheria, and acellular pertussis (Tdap, Td) vaccine. You may need a Td booster every 10 years. Zoster vaccine. You may need this after age 10. Pneumococcal 13-valent conjugate (PCV13) vaccine. One dose is recommended after age 43. Pneumococcal polysaccharide (PPSV23) vaccine. One dose is recommended after age 39.  Talk to your  health care provider about which screenings and vaccines you need and how often you need them. This information is not intended to replace advice given to you by your health care provider. Make sure you discuss any questions you have with your health care provider. Document Released: 03/29/2015 Document Revised: 11/20/2015 Document Reviewed: 01/01/2015 Elsevier Interactive Patient Education  2017 [employer] Prevention in the Home Falls can cause injuries. They can happen to people of all ages. There are many things you can do to make your home safe and to help prevent falls. What can I do on the outside of my home? Regularly fix the edges of walkways and driveways and fix any cracks. Remove anything that might make you trip as you walk through a door, such as a raised step or threshold. Trim any bushes or trees on the path to your home. Use bright outdoor lighting. Clear any walking paths of anything that might make someone trip, such as rocks or tools. Regularly check to see if handrails are loose or broken. Make sure that both sides of any steps have handrails. Any raised decks and porches should have guardrails on the edges. Have any leaves, snow, or ice cleared regularly. Use sand or salt on walking paths during winter. Clean up any spills in your garage right away. This includes oil or grease spills. What can I do in the bathroom? Use night lights. Install grab bars by the toilet and in the tub and shower. Do not use towel bars as grab bars. Use non-skid mats or decals in the tub or shower. If you need to sit down in the shower, use a plastic, non-slip stool. Keep the floor dry. Clean up any water that spills on the floor as soon as it happens. Remove soap buildup in the tub or shower regularly. Attach bath mats securely with double-sided non-slip rug tape. Do not have throw rugs and other things on the floor that can make you trip. What can I do in the bedroom? Use night  lights. Make sure that you have a light by your bed that is easy to reach. Do not use any sheets or blankets that are too big for your bed. They should not hang down onto the floor. Have a firm chair that has side arms. You can use this for support while you get dressed. Do not have throw rugs and other things on the floor that can make you trip. What can I do in the kitchen? Clean up any spills right away. Avoid walking on wet floors. Keep items that you use a lot in easy-to-reach places. If you need to reach something above you, use a strong step stool that has a grab bar. Keep electrical cords out of the way. Do not use floor polish or wax that makes floors slippery. If you must use wax, use non-skid floor wax. Do not have throw rugs and other things on the floor that can make you trip. What can I do with my stairs? Do not leave any items on the stairs. Make sure that there are handrails on both sides of the stairs and use them. Fix handrails that are broken or loose. Make sure that handrails are as long as the stairways. Check any carpeting to make sure that it is firmly attached to the stairs. Fix any carpet that is loose or worn. Avoid having throw rugs at the top or bottom of the stairs. If you do have throw  rugs, attach them to the floor with carpet tape. Make sure that you have a light switch at the top of the stairs and the bottom of the stairs. If you do not have them, ask someone to add them for you. What else can I do to help prevent falls? Wear shoes that: Do not have high heels. Have rubber bottoms. Are comfortable and fit you well. Are closed at the toe. Do not wear sandals. If you use a stepladder: Make sure that it is fully opened. Do not climb a closed stepladder. Make sure that both sides of the stepladder are locked into place. Ask someone to hold it for you, if possible. Clearly mark and make sure that you can see: Any grab bars or handrails. First and last  steps. Where the edge of each step is. Use tools that help you move around (mobility aids) if they are needed. These include: Canes. Walkers. Scooters. Crutches. Turn on the lights when you go into a dark area. Replace any light bulbs as soon as they burn out. Set up your furniture so you have a clear path. Avoid moving your furniture around. If any of your floors are uneven, fix them. If there are any pets around you, be aware of where they are. Review your medicines with your doctor. Some medicines can make you feel dizzy. This can increase your chance of falling. Ask your doctor what other things that you can do to help prevent falls. This information is not intended to replace advice given to you by your health care provider. Make sure you discuss any questions you have with your health care provider. Document Released: 12/27/2008 Document Revised: 08/08/2015 Document Reviewed: 04/06/2014 Elsevier Interactive Patient Education  2017 [employer].

## 2020-10-28 NOTE — Progress Notes (Signed)
I connected with [name] today by telephone and verified that I am speaking with the correct person using two identifiers. Location patient: home Location provider: work Persons participating in the virtual visit: [name], Glenna Durand LPN.   I discussed the limitations, risks, security and privacy concerns of performing an evaluation and management service by telephone and the availability of in person appointments. I also discussed with the patient that there may be a patient responsible charge related to this service. The patient expressed understanding and verbally consented to this telephonic visit.    Interactive audio and video telecommunications were attempted between this provider and patient, however failed, due to patient having technical difficulties OR patient did not have access to video capability.  We continued and completed visit with audio only.     Vital signs may be patient reported or missing.  Subjective:   Danielle Stephens is a 70 y.o. female who presents for Medicare Annual (Subsequent) preventive examination.  Review of Systems     Cardiac Risk Factors include: advanced age (>94mn, >>35women);hypertension;sedentary lifestyle     Objective:    Today's Vitals   10/28/20 1426 10/28/20 1427  BP: 114/71   Pulse: 76   Temp: (!) 96 F (35.6 C)   Weight: 101 lb (45.8 kg)   Height: '5\' 1"'$  (1.549 m)   PainSc:  7    Body mass index is 19.08 kg/m.  Advanced Directives 10/28/2020 05/20/2020 05/10/2020 10/27/2019 03/06/2019 03/01/2019 01/20/2019  Does Patient Have a Medical Advance Directive? No No No No No No No  Would patient like information on creating a medical advance directive? - No - Patient declined - - No - Patient declined - -    Current Medications (verified) Outpatient Encounter Medications as of 10/28/2020  Medication Sig   acetaminophen (TYLENOL) 500 MG tablet Take 1,000 mg by mouth every 6 (six) hours as needed for mild pain or moderate pain.    acetaminophen (TYLENOL) 500 MG tablet Take 2 tablets (1,000 mg total) by mouth every 8 (eight) hours.   doxylamine, Sleep, (SLEEP AID) 25 MG tablet Take 25 mg by mouth at bedtime as needed for sleep.   lisinopril (ZESTRIL) 20 MG tablet Take 1 tablet (20 mg total) by mouth daily.   MELATONIN GUMMIES PO Take 2 capsules by mouth at bedtime as needed.   meloxicam (MOBIC) 15 MG tablet Take by mouth.   Ascorbic Acid (VITAMIN C) 1000 MG tablet Take 1,000 mg by mouth daily. (Patient not taking: Reported on 10/28/2020)   Biotin 1000 MCG tablet Take 1,000 mcg by mouth daily. (Patient not taking: Reported on 10/28/2020)   Cholecalciferol (VITAMIN D) 50 MCG (2000 UT) CAPS Take 2,000 Units by mouth daily. gummie (Patient not taking: Reported on 10/28/2020)   fexofenadine (ALLEGRA ALLERGY) 180 MG tablet Take 1 tablet (180 mg total) by mouth daily. (Patient not taking: Reported on 10/28/2020)   methocarbamol (ROBAXIN) 500 MG tablet Take 1 tablet (500 mg total) by mouth every 6 (six) hours as needed for muscle spasms. (Patient not taking: No sig reported)   Multiple Vitamin (MULTIVITAMIN) capsule Take 1 capsule by mouth daily. (Patient not taking: No sig reported)   ondansetron (ZOFRAN ODT) 4 MG disintegrating tablet Take 1 tablet (4 mg total) by mouth every 8 (eight) hours as needed for nausea or vomiting. (Patient not taking: No sig reported)   oxyCODONE (ROXICODONE) 5 MG immediate release tablet Take 1-2 tablets (5-10 mg total) by mouth every 4 (four) hours as  needed (pain). (Patient not taking: No sig reported)   No facility-administered encounter medications on file as of 10/28/2020.    Allergies (verified) Codeine and Tramadol   History: Past Medical History:  Diagnosis Date   Anxiety    Chronic kidney disease    STAGE 3   Hypertension    Shoulder pain    Past Surgical History:  Procedure Laterality Date   CLOSED MANIPULATION SHOULDER WITH STERIOD INJECTION Right 08/28/2019   Procedure: MANIPULATION  SHOULDER WITH STEROID INJECTION x2;  Surgeon: Leim Fabry, MD;  Location: [facility] ORS;  Service: Orthopedics;  Laterality: Right;   COLONOSCOPY     DILATION AND CURETTAGE, DIAGNOSTIC / THERAPEUTIC     REVERSE SHOULDER ARTHROPLASTY Right 05/20/2020   Procedure: Right reverse shoulder arthroplasty, biceps tenodesis;  Surgeon: Leim Fabry, MD;  Location: [facility] OR[facility];  Service: Orthopedics;  Laterality: Right;   rotary cuff Right 03/06/2019   SHOULDER ARTHROSCOPY Right 08/28/2019   Procedure: Right shoulder arthroscopic capsular release, lysis of adhesion,;  Surgeon: Leim Fabry, MD;  Location: [facility] OR[facility];  Service: Orthopedics;  Laterality: Right;   Family History  Problem Relation Age of Onset   Hypertension Mother    Cancer Mother    Hypertension Father    Heart attack Father    Stroke Sister    Hypertension Sister    Heart attack Brother    Hypertension Sister    Hypertension Sister    Hypertension Sister    Hypertension Sister    Hypertension Sister    Down syndrome Brother    Breast cancer Neg Hx    Social History   Socioeconomic History   Marital status: Married    Spouse name: Not on file   Number of children: 2   Years of education: Not on file   Highest education level: Not on file  Occupational History   Occupation: retired  Tobacco Use   Smoking status: Never   Smokeless tobacco: Never  Vaping Use   Vaping Use: Never used  Substance and Sexual Activity   Alcohol use: No   Drug use: No   Sexual activity: Yes  Other Topics Concern   Not on file  Social History Narrative   Not on file   Social Determinants of Health   Financial Resource Strain: Low Risk    Difficulty of Paying Living Expenses: Not hard at all  Food Insecurity: No Food Insecurity   Worried About Charity fundraiser in the Last Year: Never true   [place] in the Last Year: Never true  Transportation Needs: No Transportation Needs   Lack of Transportation (Medical): No   Lack of  Transportation (Non-Medical): No  Physical Activity: Inactive   Days of Exercise per Week: 0 days   Minutes of Exercise per Session: 0 min  Stress: No Stress Concern Present   Feeling of Stress : Not at all  Social Connections: Not on file    Tobacco Counseling Counseling given: Not Answered   Clinical Intake:  Pre-visit preparation completed: Yes  Pain : 0-10 Pain Score: 7  Pain Type: Chronic pain Pain Location: Shoulder Pain Orientation: Right Pain Descriptors / Indicators: Tingling, Sharp, Pins and needles Pain Onset: More than a month ago Pain Frequency: Constant     Nutritional Status: BMI of 19-24  Normal Nutritional Risks: None Diabetes: No  How often do you need to have someone help you when you read instructions, pamphlets, or other written materials from your doctor or pharmacy?: 1 -  Never What is the last grade level you completed in school?: some college  Diabetic? no  Interpreter Needed?: No  Information entered by :: NAllen LPN   Activities of Daily Living In your present state of health, do you have any difficulty performing the following activities: 10/28/2020 05/10/2020  Hearing? N N  Vision? N N  Comment a little blurry sometimes -  Difficulty concentrating or making decisions? N N  Walking or climbing stairs? N N  Dressing or bathing? N N  Doing errands, shopping? N N  Preparing Food and eating ? N -  Using the Toilet? N -  In the past six months, have you accidently leaked urine? N -  Do you have problems with loss of bowel control? N -  Managing your Medications? N -  Managing your Finances? N -  Housekeeping or managing your Housekeeping? N -  Some recent data might be hidden    Patient Care Team: Venita Lick, NP as PCP - General (Nurse Practitioner) Rico Junker, RN as Registered Nurse Theodore Demark, RN as Registered Nurse  Indicate any recent Medical Services you may have received from other than [facility] providers in the  past year (date may be approximate).     Assessment:   This is a routine wellness examination for [facility].  Hearing/Vision screen Vision Screening - Comments:: No regular eye exams,  Dietary issues and exercise activities discussed: Current Exercise Habits: The patient does not participate in regular exercise at present   Goals Addressed             This Visit's Progress    Patient Stated       10/28/2020, find out what's going on with shoulder       Depression Screen PHQ 2/9 Scores 10/28/2020 09/23/2020 10/27/2019 08/17/2019 04/10/2019  PHQ - 2 Score 0 0 0 0 0    Fall Risk Fall Risk  10/28/2020 09/23/2020 10/27/2019 08/17/2019 04/10/2019  Falls in the past year? 0 0 0 0 0  Number falls in past yr: - 0 - 0 0  Injury with Fall? - 0 - 0 0  Risk for fall due to : Medication side effect No Fall Risks Medication side effect - -  Follow up Falls evaluation completed;Education provided;Falls prevention discussed Falls evaluation completed Falls evaluation completed;Education provided;Falls prevention discussed Falls evaluation completed -    FALL RISK PREVENTION PERTAINING TO THE HOME:  Any stairs in or around the home? Yes  If so, are there any without handrails? No  Home free of loose throw rugs in walkways, pet beds, electrical cords, etc? Yes  Adequate lighting in your home to reduce risk of falls? Yes   ASSISTIVE DEVICES UTILIZED TO PREVENT FALLS:  Life alert? No  Use of a cane, walker or w/c? No  Grab bars in the bathroom? No  Shower chair or bench in shower? No  Elevated toilet seat or a handicapped toilet? No   TIMED UP AND GO:  Was the test performed? No .      Cognitive Function:     6CIT Screen 10/28/2020 10/27/2019  What Year? 0 points 0 points  What month? 0 points 0 points  What time? 0 points 0 points  Count back from 20 0 points 0 points  Months in reverse 2 points 2 points  Repeat phrase 0 points 0 points  Total Score 2 2    Immunizations Immunization  History  Administered Date(s) Administered   Influenza-Unspecified 02/24/2019  PFIZER(Purple Top)SARS-COV-2 Vaccination 05/12/2019, 06/07/2019   Pneumococcal Polysaccharide-23 04/11/2020   Zoster Recombinat (Shingrix) 04/11/2020    TDAP status: Due, Education has been provided regarding the importance of this vaccine. Advised may receive this vaccine at local pharmacy or Health Dept. Aware to provide a copy of the vaccination record if obtained from local pharmacy or Health Dept. Verbalized acceptance and understanding.  Flu Vaccine status: Due, Education has been provided regarding the importance of this vaccine. Advised may receive this vaccine at local pharmacy or Health Dept. Aware to provide a copy of the vaccination record if obtained from local pharmacy or Health Dept. Verbalized acceptance and understanding.  Pneumococcal vaccine status: Up to date  Covid-19 vaccine status: Completed vaccines  Qualifies for Shingles Vaccine? Yes   Zostavax completed No   Shingrix Completed?:needs second dose  Screening Tests Health Maintenance  Topic Date Due   TETANUS/TDAP  Never done   COLONOSCOPY (Pts 45-36yr Insurance coverage will need to be confirmed)  Never done   COVID-19 Vaccine (3 - Booster for Pfizer series) 11/07/2019   Zoster Vaccines- Shingrix (2 of 2) 06/06/2020   INFLUENZA VACCINE  10/14/2020   PNA vac Low Risk Adult (2 of 2 - PCV13) 04/11/2021   MAMMOGRAM  12/06/2021   DEXA SCAN  Completed   Hepatitis C Screening  Completed   HPV VACCINES  Aged Out    Health Maintenance  Health Maintenance Due  Topic Date Due   TETANUS/TDAP  Never done   COLONOSCOPY (Pts 45-463yrInsurance coverage will need to be confirmed)  Never done   COVID-19 Vaccine (3 - Booster for PfColumbuseries) 11/07/2019   Zoster Vaccines- Shingrix (2 of 2) 06/06/2020   INFLUENZA VACCINE  10/14/2020    Colorectal cancer screening: due   Mammogram status: Completed 12/07/2019. Repeat every  year  Bone Density status: Completed 12/07/2019.   Lung Cancer Screening: (Low Dose CT Chest recommended if Age 68-80ears, 30 pack-year currently smoking OR have quit w/in 15years.) does not qualify.   Lung Cancer Screening Referral: no  Additional Screening:  Hepatitis C Screening: does qualify; Completed 08/17/2019  Vision Screening: Recommended annual ophthalmology exams for early detection of glaucoma and other disorders of the eye. Is the patient up to date with their annual eye exam?  No  Who is the provider or what is the name of the office in which the patient attends annual eye exams? none If pt is not established with a provider, would they like to be referred to a provider to establish care? No .   Dental Screening: Recommended annual dental exams for proper oral hygiene  Community Resource Referral / Chronic Care Management: CRR required this visit?  No   CCM required this visit?  No      Plan:     I have personally reviewed and noted the following in the patient's chart:   Medical and social history Use of alcohol, tobacco or illicit drugs  Current medications and supplements including opioid prescriptions.  Functional ability and status Nutritional status Physical activity Advanced directives List of other physicians Hospitalizations, surgeries, and ER visits in previous 12 months Vitals Screenings to include cognitive, depression, and falls Referrals and appointments  In addition, I have reviewed and discussed with patient certain preventive protocols, quality metrics, and best practice recommendations. A written personalized care plan for preventive services as well as general preventive health recommendations were provided to patient.     NiKellie SimmeringLPN   [id number]  Nurse Notes:

## 2020-11-21 ENCOUNTER — Other Ambulatory Visit: Payer: Self-pay | Admitting: Orthopedic Surgery

## 2020-11-21 DIAGNOSIS — M25311 Other instability, right shoulder: Secondary | ICD-10-CM

## 2020-12-04 ENCOUNTER — Other Ambulatory Visit: Payer: Self-pay

## 2020-12-04 ENCOUNTER — Ambulatory Visit
Admission: RE | Admit: 2020-12-04 | Discharge: 2020-12-04 | Disposition: A | Discharge status: Home / Self Care | Payer: Medicare HMO | Source: Ambulatory Visit | Attending: Orthopedic Surgery | Admitting: Orthopedic Surgery

## 2020-12-04 DIAGNOSIS — M25311 Other instability, right shoulder: Secondary | ICD-10-CM | POA: Diagnosis not present

## 2020-12-13 ENCOUNTER — Other Ambulatory Visit: Payer: Self-pay | Admitting: Orthopedic Surgery

## 2020-12-13 DIAGNOSIS — G8929 Other chronic pain: Secondary | ICD-10-CM

## 2020-12-23 ENCOUNTER — Other Ambulatory Visit: Payer: Self-pay

## 2020-12-23 ENCOUNTER — Ambulatory Visit
Payer: Medicare HMO | Attending: Student in an Organized Health Care Education/Training Program | Admitting: Student in an Organized Health Care Education/Training Program

## 2020-12-23 ENCOUNTER — Encounter: Payer: Self-pay | Admitting: Student in an Organized Health Care Education/Training Program

## 2020-12-23 VITALS — BP 184/89 | HR 82 | Temp 98.4°F | Resp 16 | Ht 62.0 in | Wt 101.0 lb

## 2020-12-23 DIAGNOSIS — G894 Chronic pain syndrome: Secondary | ICD-10-CM | POA: Diagnosis present

## 2020-12-23 DIAGNOSIS — G5681 Other specified mononeuropathies of right upper limb: Secondary | ICD-10-CM | POA: Diagnosis present

## 2020-12-23 DIAGNOSIS — M25511 Pain in right shoulder: Secondary | ICD-10-CM

## 2020-12-23 DIAGNOSIS — S143XXA Injury of brachial plexus, initial encounter: Secondary | ICD-10-CM | POA: Insufficient documentation

## 2020-12-23 DIAGNOSIS — S143XXD Injury of brachial plexus, subsequent encounter: Secondary | ICD-10-CM | POA: Diagnosis not present

## 2020-12-23 DIAGNOSIS — M75101 Unspecified rotator cuff tear or rupture of right shoulder, not specified as traumatic: Secondary | ICD-10-CM | POA: Diagnosis not present

## 2020-12-23 DIAGNOSIS — Z96611 Presence of right artificial shoulder joint: Secondary | ICD-10-CM

## 2020-12-23 DIAGNOSIS — G8929 Other chronic pain: Secondary | ICD-10-CM | POA: Diagnosis present

## 2020-12-23 DIAGNOSIS — M12811 Other specific arthropathies, not elsewhere classified, right shoulder: Secondary | ICD-10-CM | POA: Insufficient documentation

## 2020-12-23 MED ORDER — TIZANIDINE HCL 4 MG PO TABS: 4.0000 mg | ORAL_TABLET | Freq: Two times a day (BID) | ORAL | 0 refills | Status: AC | PRN

## 2020-12-23 NOTE — Progress Notes (Signed)
Safety precautions to be maintained throughout the outpatient stay will include: orient to surroundings, keep bed in low position, maintain call bell within reach at all times, provide assistance with transfer out of bed and ambulation.  

## 2020-12-23 NOTE — Progress Notes (Signed)
Patient: Danielle Stephens  Service Category: E/M  Provider: Gillis Santa, MD  DOB: 02/27/1951  DOS: 12/23/2020  Referring Provider: Leim Fabry, MD  MRN: [id number]  Setting: Ambulatory outpatient  PCP: Venita Lick, NP  Type: New Patient  Specialty: Interventional Pain Management    Location: Office  Delivery: Face-to-face     Primary Reason(s) for Visit: Encounter for initial evaluation of one or more chronic problems (new to examiner) potentially causing chronic pain, and posing a threat to normal musculoskeletal function. (Level of risk: High) CC: Shoulder Pain (Right, s/p 3 surgeries. The last was a total shoulder replacement May 20 2020)  HPI  Danielle Stephens is a 70 y.o. year old, female patient, who comes for the first time to our practice referred by Leim Fabry, MD for our initial evaluation of her chronic pain. 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 Today she comes in for evaluation of her Shoulder Pain (Right, s/p 3 surgeries. The last was a total shoulder replacement May 20 2020)  Pain Assessment: Location: Right Shoulder Radiating: into shoulder blade and down the arm to the hand and causing some involuntary movement of thumb on rigtht hand. Onset: More than a month ago Duration: Chronic pain Quality: Discomfort, Constant, Pins and needles, Sore, Throbbing, Heaviness Severity: 7 /10 (subjective, self-reported pain score)  Effect on ADL: tries to do things to take her mind off of it. feels like she can hear a grinding noise when cleaning (wiping motion) Timing: Constant Modifying  factors: states she is taking tramadol with benadryl and a sleeping pill at night to help with the pain. BP: (!) 184/89  HR: 82  Onset and Duration: Present longer than 3 months Cause of pain:  began after lifting a heavy object Severity: No change since onset, NAS-11 at its worse: 10/10, NAS-11 at its best: 6/10, NAS-11 now: 6/10, and NAS-11 on the average: 7/10 Timing: Not influenced by the time of the day Aggravating Factors: Prolonged sitting and Prolonged standing Alleviating Factors: Cold packs Associated Problems: Pain that wakes patient up Quality of Pain: Aching, Agonizing, Annoying, Shooting, Tender, Throbbing, and Tingling Previous Examinations or Tests: CT scan, X-rays, and Orthopedic evaluation Previous Treatments: Narcotic medications and Physical Therapy  Danielle Stephens is a pleasant 70 year old female who presents with a chief complaint of right shoulder pain status post 3 shoulder surgeries with her most recent one being right reverse total shoulder arthroplasty done May 20, 2020.  She continues to struggle with severe pain of her right shoulder with limited range of motion.  She also endorses numbness and tingling around her anterior shoulder region medial to her deltoid.  She has severely limited range of motion related to pain.  She has done steroid injections in the past.  She has done physical therapy in the past.  She is currently on tramadol 50 mg daily.  She also utilizes meloxicam as needed.  She is complaining of muscle spasms along her anterior deltoid and along her trapezius muscles.  Historic Controlled Substance Pharmacotherapy Review  PMP and historical list of controlled substances:   12/12/2020  12/12/2020   1  Tramadol Hcl 50 Mg Tablet 30.00  8  [place] Pat  [id number]   [place] ([id number])  0/0  18.75 MME  Medicare  [place]     Historical Monitoring: The patient  reports no history of drug use. List of all UDS Test(s): No results found for: MDMA, COCAINSCRNUR, [place], [place],  CANNABQUANT, THCU, Home List of other Serum/Urine Drug Screening Test(s):  No results found for: AMPHSCRSER, BARBSCRSER, BENZOSCRSER, COCAINSCRSER, COCAINSCRNUR, PCPSCRSER, PCPQUANT, THCSCRSER, THCU, CANNABQUANT, OPIATESCRSER, OXYSCRSER, PROPOXSCRSER, ETH Historical Background Evaluation: [place] PMP: PDMP reviewed during this encounter. Online review of the past 76-monthperiod conducted.              [place] Department of public safety, offender search: (Editor, commissioningInformation) Non-contributory Risk Assessment Profile: Aberrant behavior: None observed or detected today Risk factors for fatal opioid overdose: None identified today Fatal overdose hazard ratio (HR): Calculation deferred Non-fatal overdose hazard ratio (HR): Calculation deferred Risk of opioid abuse or dependence: 0.7-3.0% with doses ? 36 MME/day and 6.1-26% with doses ? 120 MME/day. Substance use disorder (SUD) risk level: See below Personal History of Substance Abuse (SUD-Substance use disorder):  Alcohol: Negative  Illegal Drugs: Negative  Rx Drugs: Negative  ORT Risk Level calculation: Low Risk  Opioid Risk Tool - 12/23/20 1039       Family History of Substance Abuse   Alcohol Negative    Illegal Drugs Negative    Rx Drugs Negative      Personal History of Substance Abuse   Alcohol Negative    Illegal Drugs Negative    Rx Drugs Negative      Age   Age between 160-45years  No      Psychological Disease   Psychological Disease Negative    Depression Negative      Total Score   Opioid Risk Tool Scoring 0    Opioid Risk Interpretation Low Risk            ORT Scoring interpretation table:  Score <3 = Low Risk for SUD  Score between 4-7 = Moderate Risk for SUD  Score >8 = High Risk for Opioid Abuse   PHQ-2 Depression Scale:  Total score:    PHQ-2 Scoring interpretation table: (Score and probability of major depressive disorder)  Score 0 = No depression  Score 1 = 15.4% Probability  Score 2 = 21.1% Probability   Score 3 = 38.4% Probability  Score 4 = 45.5% Probability  Score 5 = 56.4% Probability  Score 6 = 78.6% Probability   PHQ-9 Depression Scale:  Total score:    PHQ-9 Scoring interpretation table:  Score 0-4 = No depression  Score 5-9 = Mild depression  Score 10-14 = Moderate depression  Score 15-19 = Moderately severe depression  Score 20-27 = Severe depression (2.4 times higher risk of SUD and 2.89 times higher risk of overuse)   Pharmacologic Plan: As per protocol, I have not taken over any controlled substance management, pending the results of ordered tests and/or consults.            Initial impression: Pending review of available data and ordered tests.  Meds   Current Outpatient Medications:    acetaminophen (TYLENOL) 500 MG tablet, Take 2 tablets (1,000 mg total) by mouth every 8 (eight) hours., Disp: 90 tablet, Rfl: 2   Biotin 1000 MCG tablet, Take 1,000 mcg by mouth daily., Disp: , Rfl:    lisinopril (ZESTRIL) 20 MG tablet, Take 1 tablet (20 mg total) by mouth daily., Disp: 90 tablet, Rfl: 4   meloxicam (MOBIC)  15 MG tablet, Take by mouth., Disp: , Rfl:    Multiple Vitamin (MULTIVITAMIN) capsule, Take 1 capsule by mouth daily., Disp: , Rfl:    tiZANidine (ZANAFLEX) 4 MG tablet, Take 1 tablet (4 mg total) by mouth 2 (two) times daily as needed for muscle spasms., Disp: 60 tablet, Rfl: 0   traMADol (ULTRAM) 50 MG tablet, Take 50 mg by mouth every 6 (six) hours as needed. Patient reports she is only taking once day., Disp: , Rfl:    acetaminophen (TYLENOL) 500 MG tablet, Take 1,000 mg by mouth every 6 (six) hours as needed for mild pain or moderate pain. (Patient not taking: Reported on 12/23/2020), Disp: , Rfl:    Ascorbic Acid (VITAMIN C) 1000 MG tablet, Take 1,000 mg by mouth daily. (Patient not taking: No sig reported), Disp: , Rfl:    Cholecalciferol (VITAMIN D) 50 MCG (2000 UT) CAPS, Take 2,000 Units by mouth daily. gummie (Patient not taking: No sig reported), Disp: , Rfl:     doxylamine, Sleep, (SLEEP AID) 25 MG tablet, Take 25 mg by mouth at bedtime as needed for sleep. (Patient not taking: Reported on 12/23/2020), Disp: , Rfl:    fexofenadine (ALLEGRA ALLERGY) 180 MG tablet, Take 1 tablet (180 mg total) by mouth daily. (Patient not taking: No sig reported), Disp: 10 tablet, Rfl: 1   MELATONIN GUMMIES PO, Take 2 capsules by mouth at bedtime as needed. (Patient not taking: Reported on 12/23/2020), Disp: , Rfl:    methocarbamol (ROBAXIN) 500 MG tablet, Take 1 tablet (500 mg total) by mouth every 6 (six) hours as needed for muscle spasms. (Patient not taking: No sig reported), Disp: 60 tablet, Rfl: 0   ondansetron (ZOFRAN ODT) 4 MG disintegrating tablet, Take 1 tablet (4 mg total) by mouth every 8 (eight) hours as needed for nausea or vomiting. (Patient not taking: No sig reported), Disp: 20 tablet, Rfl: 0   oxyCODONE (ROXICODONE) 5 MG immediate release tablet, Take 1-2 tablets (5-10 mg total) by mouth every 4 (four) hours as needed (pain). (Patient not taking: No sig reported), Disp: 30 tablet, Rfl: 0  Imaging Review  CLINICAL DATA:  Right shoulder pain and limited range of motion. History of 2 prior shoulder surgeries.  EXAM: MRI OF THE RIGHT SHOULDER WITHOUT CONTRAST  TECHNIQUE: Multiplanar, multisequence MR imaging of the shoulder was performed. No intravenous contrast was administered.  COMPARISON:  MRI 02/17/2019  FINDINGS: Rotator cuff: Large recurrent full-thickness retracted rotator cuff tear. This involves the infraspinatus tendon and most of the mid and posterior fibers of the supraspinatus tendon. The anterior fibers are intact. The infraspinatus tear is in the critical zone region and maximum retraction is approximately 27 mm. The supraspinatus tendon tear is also in the critical zone region and maximum retraction is 22 mm.  The subscapularis tendon is intact.  Muscles: No significant fatty atrophy. There is moderate edema like signal  changes in the supraspinatus muscle.  Biceps long head:  Evidence of long head biceps tenodesis.  Acromioclavicular Joint: Postoperative changes from distal clavicle resection and acromioplasty. No findings for bony impingement.  Glenohumeral Joint: Moderate degenerative changes and small joint effusion. The humeral head is riding high in the glenoid fossa due to the rotator cuff tear.  Labrum: The superior labrum demonstrates surgical debridement changes. The anterior and posterior labrum appear intact.  Bones:  No acute bony findings.  Other: Expected fluid in the subacromial/subdeltoid bursa and moderate narrowing of the humeroacromial space.  IMPRESSION: 1. Large recurrent full-thickness  retracted rotator cuff tear as described above. 2. Evidence of long head biceps tenodesis and surgical debridement of the superior labrum. 3. Postoperative changes from distal clavicle resection and acromioplasty. No findings for bony impingement. 4. Moderate glenohumeral joint degenerative changes.   Electronically Signed By: Marijo Sanes M.D. On: 02/14/2020 08:46   CLINICAL DATA:  Right shoulder pain post shoulder arthroplasty. History of surgery x3. No recent injury.  EXAM: CT OF THE UPPER RIGHT EXTREMITY WITHOUT CONTRAST  TECHNIQUE: Multidetector CT imaging of the right shoulder was performed according to the standard protocol.  COMPARISON:  Radiographs 05/20/2020.  CT 05/07/2020.  FINDINGS: Bones/Joint/Cartilage  Since the prior CT, the patient has undergone interval right shoulder reverse arthroplasty. There is associated beam hardening artifact. The hardware appears intact without loosening. The central glenoid screw protrudes slightly beyond the medial cortex of the scapular body. There is no evidence of acute fracture or dislocation.  No significant shoulder joint effusion identified. There are stable postsurgical changes at the acromioclavicular  joint.  Ligaments  Suboptimally assessed by CT.  Muscles and Tendons  New fatty atrophy of the subscapularis muscle. No other focal rotator cuff muscular atrophy identified.  Soft tissues  No periarticular fluid collection or unexpected foreign body identified. Right lung apical scarring and a 3 mm right lower lobe pulmonary nodule (image 335/3) are unchanged.  IMPRESSION: 1. Interval right shoulder reverse arthroplasty without evidence of complication. The hardware appears intact without loosening. 2. There does appear to be mild subscapularis muscular atrophy. 3. Stable postsurgical changes at the acromioclavicular joint. 4. Stable 3 mm right lower lobe pulmonary nodule from previous CT of 7 months, supporting a benign etiology. This appearance is almost certainly benign, and no dedicated follow-up is required if this patient is low risk for bronchogenic carcinoma (and has no known or suspected primary neoplasm). Non-contrast chest CT can be considered in 12 months if patient is high-risk. This recommendation follows the consensus statement: Guidelines for Management of Incidental Pulmonary Nodules Detected on CT Images: From the Fleischner Society 2017; Radiology 2017; 284:228-243.   Electronically Signed By: Richardean Sale M.D. On: 12/06/2020 07:50 Narrative CLINICAL DATA:  Right shoulder injury with pain radiating to the right arm, initial encounter.  EXAM: RIGHT SHOULDER - 2+ VIEW  COMPARISON:  None.  FINDINGS: No acute osseous or joint abnormality. Degenerative changes in the right acromioclavicular joint. Visualized portion of the right chest is grossly unremarkable.  IMPRESSION: 1. No acute findings. 2. Mild right acromioclavicular joint osteoarthritis.   Electronically Signed By: Lorin Picket M.D. On: 01/20/2019 11:41  ROS  Cardiovascular: High blood pressure Pulmonary or Respiratory: No reported pulmonary signs or symptoms such as wheezing  and difficulty taking a deep full breath (Asthma), difficulty blowing air out (Emphysema), coughing up mucus (Bronchitis), persistent dry cough, or temporary stoppage of breathing during sleep Neurological: No reported neurological signs or symptoms such as seizures, abnormal skin sensations, urinary and/or fecal incontinence, being born with an abnormal open spine and/or a tethered spinal cord Psychological-Psychiatric: No reported psychological or psychiatric signs or symptoms such as difficulty sleeping, anxiety, depression, delusions or hallucinations (schizophrenial), mood swings (bipolar disorders) or suicidal ideations or attempts Gastrointestinal: No reported gastrointestinal signs or symptoms such as vomiting or evacuating blood, reflux, heartburn, alternating episodes of diarrhea and constipation, inflamed or scarred liver, or pancreas or irrregular and/or infrequent bowel movements Genitourinary: No reported renal or genitourinary signs or symptoms such as difficulty voiding or producing urine, peeing blood, non-functioning kidney, kidney stones, difficulty  emptying the bladder, difficulty controlling the flow of urine, or chronic kidney disease Hematological: No reported hematological signs or symptoms such as prolonged bleeding, low or poor functioning platelets, bruising or bleeding easily, hereditary bleeding problems, low energy levels due to low hemoglobin or being anemic Endocrine: No reported endocrine signs or symptoms such as high or low blood sugar, rapid heart rate due to high thyroid levels, obesity or weight gain due to slow thyroid or thyroid disease Rheumatologic: No reported rheumatological signs and symptoms such as fatigue, joint pain, tenderness, swelling, redness, heat, stiffness, decreased range of motion, with or without associated rash Musculoskeletal: Negative for myasthenia gravis, muscular dystrophy, multiple sclerosis or malignant hyperthermia  Allergies  Ms. Priebe is  allergic to codeine and tramadol.  Laboratory Chemistry Profile   Renal Lab Results  Component Value Date   BUN 21 05/20/2020   CREATININE 0.90 05/20/2020   BCR 19 02/16/2020   GFRAA 71 02/16/2020   GFRNONAA >60 05/16/2020     Electrolytes Lab Results  Component Value Date   NA 142 05/20/2020   K 3.9 05/20/2020   CL 103 05/20/2020   CALCIUM 9.7 05/16/2020     Hepatic Lab Results  Component Value Date   AST 19 09/21/2019   ALT 12 09/21/2019   ALBUMIN 4.5 09/21/2019   ALKPHOS 78 09/21/2019     ID Lab Results  Component Value Date   SARSCOV2NAA NEGATIVE 05/16/2020   STAPHAUREUS NEGATIVE 05/16/2020   MRSAPCR NEGATIVE 05/16/2020     Bone Lab Results  Component Value Date   VD25OH 36.9 02/16/2020     Endocrine Lab Results  Component Value Date   GLUCOSE 75 05/20/2020   TSH 1.660 08/17/2019     Neuropathy No results found for: VITAMINB12, FOLATE, HGBA1C, HIV   CNS No results found for: COLORCSF, APPEARCSF, RBCCOUNTCSF, WBCCSF, POLYSCSF, LYMPHSCSF, EOSCSF, PROTEINCSF, GLUCCSF, JCVIRUS, CSFOLI, IGGCSF, LABACHR, ACETBL, LABACHR, ACETBL   Inflammation (CRP: Acute  ESR: Chronic) No results found for: CRP, ESRSEDRATE, LATICACIDVEN   Rheumatology Lab Results  Component Value Date   LABURIC 5.0 09/21/2019     Coagulation Lab Results  Component Value Date   INR 1.0 05/16/2020   LABPROT 12.3 05/16/2020   APTT 30 05/16/2020   PLT 258 05/16/2020   DDIMER 2.32 (H) 09/21/2019     Cardiovascular Lab Results  Component Value Date   HGB 11.9 (L) 05/20/2020   HCT 35.0 (L) 05/20/2020     Screening Lab Results  Component Value Date   SARSCOV2NAA NEGATIVE 05/16/2020   STAPHAUREUS NEGATIVE 05/16/2020   MRSAPCR NEGATIVE 05/16/2020     Cancer No results found for: CEA, CA125, LABCA2   Allergens No results found for: ALMOND, APPLE, ASPARAGUS, AVOCADO, BANANA, BARLEY, BASIL, BAYLEAF, GREENBEAN, LIMABEAN, WHITEBEAN, BEEFIGE, REDBEET, BLUEBERRY, BROCCOLI,  CABBAGE, MELON, CARROT, CASEIN, CASHEWNUT, CAULIFLOWER, CELERY     Note: Lab results reviewed.  PFSH  Drug: Ms. Kramm  reports no history of drug use. Alcohol:  reports no history of alcohol use. Tobacco:  reports that she has never smoked. She has never used smokeless tobacco. Medical:  has a past medical history of Anxiety, Chronic kidney disease, Hypertension, and Shoulder pain. Family: family history includes Cancer in her mother; Down syndrome in her brother; Heart attack in her brother and father; Hypertension in her father, mother, sister, sister, sister, sister, sister, and sister; Stroke in her sister.  Past Surgical History:  Procedure Laterality Date   CLOSED MANIPULATION SHOULDER WITH STERIOD INJECTION Right 08/28/2019  Procedure: MANIPULATION SHOULDER WITH STEROID INJECTION x2;  Surgeon: Leim Fabry, MD;  Location: [facility];  Service: Orthopedics;  Laterality: Right;   COLONOSCOPY     DILATION AND CURETTAGE, DIAGNOSTIC / THERAPEUTIC     REVERSE SHOULDER ARTHROPLASTY Right 05/20/2020   Procedure: Right reverse shoulder arthroplasty, biceps tenodesis;  Surgeon: Leim Fabry, MD;  Location: [facility];  Service: Orthopedics;  Laterality: Right;   rotary cuff Right 03/06/2019   SHOULDER ARTHROSCOPY Right 08/28/2019   Procedure: Right shoulder arthroscopic capsular release, lysis of adhesion,;  Surgeon: Leim Fabry, MD;  Location: [facility];  Service: Orthopedics;  Laterality: Right;   Active Ambulatory Problems    Diagnosis Date Noted   Benign essential HTN 03/19/2019   H/O repair of right rotator cuff 04/10/2019   Elevated LDL cholesterol level 08/17/2019   CKD (chronic kidney disease) stage 3, GFR 30-59 ml/min ([facility]) 08/17/2019   Vitamin D deficiency 08/18/2019   Left foot pain 09/21/2019   Osteopenia 02/11/2020   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 In no apparent acute distress Vitals:   12/23/20 1031  BP: (!) 184/89  Pulse: 82  Resp: 16  Temp: 98.4 F (36.9 C)  TempSrc: Temporal  SpO2: 100%  Weight: 101 lb (45.8 kg)  Height: 5' 2"  (1.575 m)   BMI Assessment: Estimated body mass index is 18.47 kg/m as calculated from the following:   Height as of this encounter: 5' 2"  (1.575 m).   Weight as of this encounter: 101 lb (45.8 kg).  BMI interpretation table: BMI level Category Range association with higher incidence of chronic pain  <18 kg/m2 Underweight   18.5-24.9 kg/m2 Ideal body weight   25-29.9 kg/m2 Overweight Increased incidence by 20%  30-34.9 kg/m2 Obese (Class I) Increased incidence by 68%  35-39.9 kg/m2 Severe obesity (Class II) Increased incidence by 136%  >40 kg/m2 Extreme obesity (Class III) Increased incidence by 254%   Patient's current BMI Ideal Body weight  Body mass index is 18.47 kg/m. Ideal body weight: 50.1 kg (110 lb 7.2 oz)   BMI Readings from Last 4 Encounters:  12/23/20 18.47 kg/m  10/28/20 19.08 kg/m  05/20/20 18.84 kg/m  04/10/20 19.20 kg/m   Wt Readings from Last 4 Encounters:  12/23/20 101 lb (45.8 kg)  10/28/20 101 lb (45.8 kg)  05/20/20 103 lb (46.7 kg)  04/10/20 105 lb (47.6 kg)    Psych/Mental status: Alert, oriented x 3 (person, place, & time)       Eyes: PERLA Respiratory: No evidence of acute respiratory distress  Cervical Spine Area Exam  Skin & Axial Inspection: No masses, redness, edema, swelling, or associated skin lesions Alignment:  Symmetrical Functional ROM: Pain restricted ROM      Stability: No instability detected Muscle Tone/Strength: Functionally intact. No obvious neuro-muscular anomalies detected. Sensory (Neurological): Referred pain pattern Palpation: No palpable anomalies             Upper Extremity (UE)  Exam    Side: Right upper extremity  Side: Left upper extremity  Skin & Extremity Inspection: Skin color, temperature, and hair growth are WNL. No peripheral edema or cyanosis. No masses, redness, swelling, asymmetry, or associated skin lesions. No contractures.  Skin & Extremity Inspection: Skin color, temperature, and hair growth are WNL. No peripheral edema or cyanosis. No masses, redness, swelling, asymmetry, or associated skin lesions. No contractures.  Functional ROM: Pain restricted ROM          Functional ROM: Unrestricted ROM          Muscle Tone/Strength: Functionally intact. No obvious neuro-muscular anomalies detected.  Muscle Tone/Strength: Functionally intact. No obvious neuro-muscular anomalies detected.  Sensory (Neurological): Neurogenic pain pattern          Sensory (Neurological): Unimpaired          Palpation: No palpable anomalies              Palpation: No palpable anomalies              Provocative Test(s):  Phalen's test: deferred Tinel's test: deferred Apley's scratch test (touch opposite shoulder):  Action 1 (Across chest): Decreased ROM Action 2 (Overhead): Decreased ROM Action 3 (LB reach): Decreased ROM   Provocative Test(s):  Phalen's test: deferred Tinel's test: deferred Apley's scratch test (touch opposite shoulder):  Action 1 (Across chest): deferred Action 2 (Overhead): deferred Action 3 (LB reach): deferred     Assessment  Primary Diagnosis & Pertinent Problem List: The primary encounter diagnosis was Chronic right shoulder pain. Diagnoses of History of right shoulder replacement, Right rotator cuff tear arthropathy, Injury of right brachial plexus, subsequent  encounter, Suprascapular entrapment neuropathy of right side, and Chronic pain syndrome were also pertinent to this visit.  Visit Diagnosis (New problems to examiner): 1. Chronic right shoulder pain   2. History of right shoulder replacement   3. Right rotator cuff tear arthropathy   4. Injury of right brachial plexus, subsequent encounter   5. Suprascapular entrapment neuropathy of right side   6. Chronic pain syndrome    Plan of Care (Initial workup plan)  Note: Ms. Plaugher was reminded that as per protocol, today's visit has been an evaluation only. We have not taken over the patient's controlled substance management.  General Recommendations: The pain condition that the patient suffers from is best treated with a multidisciplinary approach that involves an increase in physical activity to prevent de-conditioning and worsening of the pain cycle, as well as psychological counseling (formal and/or informal) to address the co-morbid psychological affects of pain. Treatment will often involve judicious use of pain medications and interventional procedures to decrease the pain, allowing the patient to participate in the physical activity that will ultimately produce long-lasting pain reductions. The goal of the multidisciplinary approach is to return the patient to a higher level of overall function and to restore their ability to perform activities of daily living.   1.  Diagnostic right suprascapular nerve block.  Risks and benefits reviewed and patient would like to proceed.  We will plan on doing this with p.o. Valium. 2.  Future considerations include right suprascapular nerve pulsed RFA, suprascapular versus axillary peripheral nerve stimulation 3.  Recommend addition of tizanidine as below for muscle spasms. 4.  UDS today which is customary for new patients.  Consider titration/optimization of tramadol.  Consider long-acting tramadol versus buprenorphine.   Lab Orders         Compliance Drug  Analysis, Ur  Procedure Orders         SUPRASCAPULAR NERVE BLOCK     Orders Placed This Encounter  Procedures   SUPRASCAPULAR NERVE BLOCK    For shoulder pain.    Standing Status:   Future    Standing Expiration Date:   03/25/2021    Scheduling Instructions:     Purpose: Diagnostic     Laterality: RIGHT     Level(s): Suprascapular notch     Sedation: PO Valium     Scheduling Timeframe: As permitted by the schedule    Order Specific Question:   Where will this procedure be performed?    Answer:   [facility]   Compliance Drug Analysis, Ur    Volume: 30 ml(s). Minimum 3 ml of urine is needed. Document temperature of fresh sample. Indications: Long term (current) use of opiate analgesic (Z79.891) Test#: [id number] (Comprehensive Profile)    Order Specific Question:   Release to patient    Answer:   Immediate     Pharmacotherapy (current): Medications ordered:  Meds ordered this encounter  Medications   tiZANidine (ZANAFLEX) 4 MG tablet    Sig: Take 1 tablet (4 mg total) by mouth 2 (two) times daily as needed for muscle spasms.    Dispense:  60 tablet    Refill:  0    Do not place this medication, or any other prescription from our practice, on "Automatic Refill". Patient may have prescription filled one day early if pharmacy is closed on scheduled refill date.   Medications administered during this visit: Annis A. Iannone "Danielle Stephens" had no medications administered during this visit.   Pharmacological management options:  Opioid Analgesics: The patient was informed that there is no guarantee that she would be a candidate for opioid analgesics. The decision will be made following CDC guidelines. This decision will be based on the results of diagnostic studies, as well as Ms. Hallum's risk profile.   Membrane stabilizer: To be determined at a later time  Muscle relaxant:  Trial of tizanidine  NSAID: Adequate regimen currently on Mobic  Other analgesic(s): To be determined at a  later time   Interventional management options: Ms. Fetterly was informed that there is no guarantee that she would be a candidate for interventional therapies. The decision will be based on the results of diagnostic studies, as well as Ms. Scalese's risk profile.  Procedure(s) under consideration:  Right suprascapular nerve block Right axillary nerve block Suprascapular nerve RFA, axillary nerve RFA Peripheral nerve stimulation Trigger point injection    Provider-requested follow-up: Return in about 2 weeks (around 01/06/2021) for Right SSNB with PO Valium.  Future Appointments  Date Time Provider [place]  12/23/2020  1:20 PM Gillis Santa, MD [facility] None  10/31/2021  2:30 PM [facility] NURSE HEALTH ADVISOR [facility]    I spent a total of 60 minutes reviewing chart data, face-to-face evaluation with the patient, counseling and coordination of care as detailed above.  Note by: Gillis Santa, MD Date: 12/23/2020; Time: 12:16 PM

## 2020-12-23 NOTE — Patient Instructions (Signed)
____________________________________________________________________________________________  General Risks and Possible Complications  Patient Responsibilities: It is important that you read this as it is part of your informed consent. It is our duty to inform you of the risks and possible complications associated with treatments offered to you. It is your responsibility as a patient to read this and to ask questions about anything that is not clear or that you believe was not covered in this document.  Patient's Rights: You have the right to refuse treatment. You also have the right to change your mind, even after initially having agreed to have the treatment done. However, under this last option, if you wait until the last second to change your mind, you may be charged for the materials used up to that point.  Introduction: Medicine is not an exact science. Everything in Medicine, including the lack of treatment(s), carries the potential for danger, harm, or loss (which is by definition: Risk). In Medicine, a complication is a secondary problem, condition, or disease that can aggravate an already existing one. All treatments carry the risk of possible complications. The fact that a side effects or complications occurs, does not imply that the treatment was conducted incorrectly. It must be clearly understood that these can happen even when everything is done following the highest safety standards.  No treatment: You can choose not to proceed with the proposed treatment alternative. The "PRO(s)" would include: avoiding the risk of complications associated with the therapy. The "CON(s)" would include: not getting any of the treatment benefits. These benefits fall under one of three categories: diagnostic; therapeutic; and/or palliative. Diagnostic benefits include: getting information which can ultimately lead to improvement of the disease or symptom(s). Therapeutic benefits are those associated with the  successful treatment of the disease. Finally, palliative benefits are those related to the decrease of the primary symptoms, without necessarily curing the condition (example: decreasing the pain from a flare-up of a chronic condition, such as incurable terminal cancer).  General Risks and Complications: These are associated to most interventional treatments. They can occur alone, or in combination. They fall under one of the following six (6) categories: no benefit or worsening of symptoms; bleeding; infection; nerve damage; allergic reactions; and/or death. No benefits or worsening of symptoms: In Medicine there are no guarantees, only probabilities. No healthcare provider can ever guarantee that a medical treatment will work, they can only state the probability that it may. Furthermore, there is always the possibility that the condition may worsen, either directly, or indirectly, as a consequence of the treatment. Bleeding: This is more common if the patient is taking a blood thinner, either prescription or over the counter (example: Goody Powders, Fish oil, Aspirin, Garlic, etc.), or if suffering a condition associated with impaired coagulation (example: Hemophilia, cirrhosis of the liver, low platelet counts, etc.). However, even if you do not have one on these, it can still happen. If you have any of these conditions, or take one of these drugs, make sure to notify your treating physician. Infection: This is more common in patients with a compromised immune system, either due to disease (example: diabetes, cancer, human immunodeficiency virus [HIV], etc.), or due to medications or treatments (example: therapies used to treat cancer and rheumatological diseases). However, even if you do not have one on these, it can still happen. If you have any of these conditions, or take one of these drugs, make sure to notify your treating physician. Nerve Damage: This is more common when the treatment is an invasive    one, but it can also happen with the use of medications, such as those used in the treatment of cancer. The damage can occur to small secondary nerves, or to large primary ones, such as those in the spinal cord and brain. This damage may be temporary or permanent and it may lead to impairments that can range from temporary numbness to permanent paralysis and/or brain death. Allergic Reactions: Any time a substance or material comes in contact with our body, there is the possibility of an allergic reaction. These can range from a mild skin rash (contact dermatitis) to a severe systemic reaction (anaphylactic reaction), which can result in death. Death: In general, any medical intervention can result in death, most of the time due to an unforeseen complication. ____________________________________________________________________________________________ Selective Nerve Root Block Patient Information  Description: Specific nerve roots exit the spinal canal and these nerves can be compressed and inflamed by a bulging disc and bone spurs.  By injecting steroids on the nerve root, we can potentially decrease the inflammation surrounding these nerves, which often leads to decreased pain.  Also, by injecting local anesthesia on the nerve root, this can provide us helpful information to give to your referring doctor if it decreases your pain.  Selective nerve root blocks can be done along the spine from the neck to the low back depending on the location of your pain.   After numbing the skin with local anesthesia, a small needle is passed to the nerve root and the position of the needle is verified using x-ray pictures.  After the needle is in correct position, we then deposit the medication.  You may experience a pressure sensation while this is being done.  The entire block usually lasts less than 15 minutes.  Conditions that may be treated with selective nerve root blocks: Low back and leg pain Spinal  stenosis Diagnostic block prior to potential surgery Neck and arm pain Post laminectomy syndrome  Preparation for the injection:  Do not eat any solid food or dairy products within 8 hours of your appointment. You may drink clear liquids up to 3 hours before an appointment.  Clear liquids include water, black coffee, juice or soda.  No milk or cream please. You may take your regular medications, including pain medications, with a sip of water before your appointment.  Diabetics should hold regular insulin (if taken separately) and take 1/2 normal NPH dose the morning of the procedure.  Carry some sugar containing items with you to your appointment. A driver must accompany you and be prepared to drive you home after your procedure. Bring all your current medications with you. An IV may be inserted and sedation may be given at the discretion of the physician. A blood pressure cuff, EKG, and other monitors will often be applied during the procedure.  Some patients may need to have extra oxygen administered for a short period. You will be asked to provide medical information, including allergies, prior to the procedure.  We must know immediately if you are taking blood  Thinners (like Coumadin) or if you are allergic to IV iodine contrast (dye).  Possible side-effects: All are usually temporary Bleeding from needle site Light headedness Numbness and tingling Decreased blood pressure Weakness in arms/legs Pressure sensation in back/neck Pain at injection site (several days)  Possible complications: All are extremely rare Infection Nerve injury Spinal headache (a headache wore with upright position)  Call if you experience: Fever/chills associated with headache or increased back/neck pain Headache worsened by   an upright position New onset weakness or numbness of an extremity below the injection site Hives or difficulty breathing (go to the emergency room) Inflammation or drainage at  the injection site(s) Severe back/neck pain greater than usual New symptoms which are concerning to you  Please note:  Although the local anesthetic injected can often make your back or neck feel good for several hours after the injection the pain will likely return.  It takes 3-5 days for steroids to work on the nerve root. You may not notice any pain relief for at least one week.  If effective, we will often do a series of 3 injections spaced 3-6 weeks apart to maximally decrease your pain.    If you have any questions, please call [phone] [facility] 

## 2020-12-30 LAB — COMPLIANCE DRUG ANALYSIS, UR

## 2021-01-01 ENCOUNTER — Encounter: Payer: Self-pay | Admitting: Neurology

## 2021-01-01 ENCOUNTER — Other Ambulatory Visit: Payer: Self-pay

## 2021-01-01 DIAGNOSIS — R202 Paresthesia of skin: Secondary | ICD-10-CM

## 2021-01-06 ENCOUNTER — Ambulatory Visit
Admission: RE | Admit: 2021-01-06 | Discharge: 2021-01-06 | Disposition: A | Discharge status: Home / Self Care | Payer: Medicare HMO | Source: Ambulatory Visit | Attending: Student in an Organized Health Care Education/Training Program | Admitting: Student in an Organized Health Care Education/Training Program

## 2021-01-06 ENCOUNTER — Encounter: Payer: Self-pay | Admitting: Student in an Organized Health Care Education/Training Program

## 2021-01-06 ENCOUNTER — Other Ambulatory Visit: Payer: Self-pay

## 2021-01-06 ENCOUNTER — Ambulatory Visit (HOSPITAL_BASED_OUTPATIENT_CLINIC_OR_DEPARTMENT_OTHER): Payer: Medicare HMO | Admitting: Student in an Organized Health Care Education/Training Program

## 2021-01-06 DIAGNOSIS — G5681 Other specified mononeuropathies of right upper limb: Secondary | ICD-10-CM

## 2021-01-06 DIAGNOSIS — G8929 Other chronic pain: Secondary | ICD-10-CM

## 2021-01-06 DIAGNOSIS — G894 Chronic pain syndrome: Secondary | ICD-10-CM | POA: Diagnosis present

## 2021-01-06 DIAGNOSIS — Z96611 Presence of right artificial shoulder joint: Secondary | ICD-10-CM | POA: Insufficient documentation

## 2021-01-06 DIAGNOSIS — M25511 Pain in right shoulder: Secondary | ICD-10-CM

## 2021-01-06 MED ORDER — ROPIVACAINE HCL 2 MG/ML IJ SOLN
4.0000 mL | Freq: Once | INTRAMUSCULAR | Status: AC
  Administered 2021-01-06: 4 mL via INTRA_ARTICULAR

## 2021-01-06 MED ORDER — DIAZEPAM 5 MG PO TABS
ORAL_TABLET | ORAL | Status: AC
  Filled 2021-01-06: qty 1

## 2021-01-06 MED ORDER — DEXAMETHASONE SODIUM PHOSPHATE 10 MG/ML IJ SOLN
10.0000 mg | Freq: Once | INTRAMUSCULAR | Status: AC
  Administered 2021-01-06: 10 mg
  Filled 2021-01-06: qty 1

## 2021-01-06 MED ORDER — LIDOCAINE HCL 2 % IJ SOLN
20.0000 mL | Freq: Once | INTRAMUSCULAR | Status: AC
  Administered 2021-01-06: 200 mg
  Filled 2021-01-06: qty 20

## 2021-01-06 NOTE — Progress Notes (Signed)
Safety precautions to be maintained throughout the outpatient stay will include: orient to surroundings, keep bed in low position, maintain call bell within reach at all times, provide assistance with transfer out of bed and ambulation.  

## 2021-01-06 NOTE — Patient Instructions (Addendum)
____________________________________________________________________________________________  Post-Procedure Discharge Instructions  Instructions: Apply ice:  Purpose: This will minimize any swelling and discomfort after procedure.  When: Day of procedure, as soon as you get home. How: Fill a plastic sandwich bag with crushed ice. Cover it with a small towel and apply to injection site. How long: (15 min on, 15 min off) Apply for 15 minutes then remove x 15 minutes.  Repeat sequence on day of procedure, until you go to bed. Apply heat:  Purpose: To treat any soreness and discomfort from the procedure. When: Starting the next day after the procedure. How: Apply heat to procedure site starting the day following the procedure. How long: May continue to repeat daily, until discomfort goes away. Food intake: Start with clear liquids (like water) and advance to regular food, as tolerated.  Physical activities: Keep activities to a minimum for the first 8 hours after the procedure. After that, then as tolerated. Driving: If you have received any sedation, be responsible and do not drive. You are not allowed to drive for 24 hours after having sedation. Blood thinner: (Applies only to those taking blood thinners) You may restart your blood thinner 6 hours after your procedure. Insulin: (Applies only to Diabetic patients taking insulin) As soon as you can eat, you may resume your normal dosing schedule. Infection prevention: Keep procedure site clean and dry. Shower daily and clean area with soap and water. Post-procedure Pain Diary: Extremely important that this be done correctly and accurately. Recorded information will be used to determine the next step in treatment. For the purpose of accuracy, follow these rules: Evaluate only the area treated. Do not report or include pain from an untreated area. For the purpose of this evaluation, ignore all other areas of pain, except for the treated area. After  your procedure, avoid taking a long nap and attempting to complete the pain diary after you wake up. Instead, set your alarm clock to go off every hour, on the hour, for the initial 8 hours after the procedure. Document the duration of the numbing medicine, and the relief you are getting from it. Do not go to sleep and attempt to complete it later. It will not be accurate. If you received sedation, it is likely that you were given a medication that may cause amnesia. Because of this, completing the diary at a later time may cause the information to be inaccurate. This information is needed to plan your care. Follow-up appointment: Keep your post-procedure follow-up evaluation appointment after the procedure (usually 2 weeks for most procedures, 6 weeks for radiofrequencies). DO NOT FORGET to bring you pain diary with you.   Expect: (What should I expect to see with my procedure?) From numbing medicine (AKA: Local Anesthetics): Numbness or decrease in pain. You may also experience some weakness, which if present, could last for the duration of the local anesthetic. Onset: Full effect within 15 minutes of injected. Duration: It will depend on the type of local anesthetic used. On the average, 1 to 8 hours.  From steroids (Applies only if steroids were used): Decrease in swelling or inflammation. Once inflammation is improved, relief of the pain will follow. Onset of benefits: Depends on the amount of swelling present. The more swelling, the longer it will take for the benefits to be seen. In some cases, up to 10 days. Duration: Steroids will stay in the system x 2 weeks. Duration of benefits will depend on multiple posibilities including persistent irritating factors. Side-effects: If present, they  may typically last 2 weeks (the duration of the steroids). Frequent: Cramps (if they occur, drink Gatorade and take over-the-counter Magnesium 450-500 mg once to twice a day); water retention with temporary  weight gain; increases in blood sugar; decreased immune system response; increased appetite. Occasional: Facial flushing (red, warm cheeks); mood swings; menstrual changes. Uncommon: Long-term decrease or suppression of natural hormones; bone thinning. (These are more common with higher doses or more frequent use. This is why we prefer that our patients avoid having any injection therapies in other practices.)  Very Rare: Severe mood changes; psychosis; aseptic necrosis. From procedure: Some discomfort is to be expected once the numbing medicine wears off. This should be minimal if ice and heat are applied as instructed.  Call if: (When should I call?) You experience numbness and weakness that gets worse with time, as opposed to wearing off. New onset bowel or bladder incontinence. (Applies only to procedures done in the spine)  Emergency Numbers: Durning business hours (Monday - Thursday, 8:00 AM - 4:00 PM) (Friday, 9:00 AM - 12:00 Noon): [phone] After hours: [phone] NOTE: If you are having a problem and are unable connect with, or to talk to a provider, then go to your nearest urgent care or emergency department. If the problem is serious and urgent, please call [phone]. ____________________________________________________________________________________________  Trigger Point Injection Trigger points are areas where you have pain. A trigger point injection is a shot given in the trigger point to help relieve pain for a few days to a few months. Common places for trigger points include the neck, shoulders, upper back, or lower back. A trigger point injection will not cure long-term (chronic) pain permanently. These injections do not always work for every person. For some people, they can help to relieve pain for a few days to a few months. Tell a health care provider about: Any allergies you have. All medicines you are taking, including vitamins, herbs, eye drops, creams, and  over-the-counter medicines. Any problems you or family members have had with anesthetic medicines. Any bleeding problems you have. Any surgeries you have had. Any medical conditions you have. Whether you are pregnant or may be pregnant. What are the risks? Generally, this is a safe procedure. However, problems may occur, including: Infection. Bleeding or bruising. Allergic reaction to the injected medicine. Irritation of the skin around the injection site. What happens before the procedure? Ask your health care provider about: Changing or stopping your regular medicines. This is especially important if you are taking diabetes medicines or blood thinners. Taking medicines such as aspirin and ibuprofen. These medicines can thin your blood. Do not take these medicines unless your health care provider tells you to take them. Taking over-the-counter medicines, vitamins, herbs, and supplements. What happens during the procedure?  Your health care provider will feel for trigger points. A marker may be used to circle the area for the injection. The skin over the trigger point will be washed with a germ-killing soap. You may be given a medicine to help you relax (sedative). A thin needle is used for the injection. You may feel pain or a twitching feeling when the needle enters your skin. A numbing solution may be injected into the trigger point. Sometimes a medicine to keep down inflammation is also injected. Your health care provider may move the needle around the area where the trigger point is located until the tightness and twitching goes away. After the injection, your health care provider may put gentle pressure  over the injection site. The injection site will be covered with a bandage (dressing). The procedure may vary among health care providers and hospitals. What can I expect after treatment? After treatment, you may have soreness and stiffness for 1-2 days. Follow these instructions  at home: Injection site care Remove your dressing in a few hours, or as told by your health care provider. Check your injection site every day for signs of infection. Check for: Redness, swelling, or pain. Fluid or blood. Warmth. Pus or a bad smell. Managing pain, stiffness, and swelling If directed, put ice on the affected area. To do this: Put ice in a plastic bag. Place a towel between your skin and the bag. Leave the ice on for 20 minutes, 2-3 times a day. Remove the ice if your skin turns bright red. This is very important. If you cannot feel pain, heat, or cold, you have a greater risk of damage to the area. Activity If you were given a sedative during the procedure, it can affect you for several hours. Do not drive or operate machinery until your health care provider says that it is safe. Do not take baths, swim, or use a hot tub until your health care provider approves. Return to your normal activities as told by your health care provider. Ask your health care provider what activities are safe for you. General instructions If you were asked to stop your regular medicines, ask your health care provider when you may start taking them again. You may be asked to see an occupational or physical therapist for exercises that reduce muscle strain and stretch the area of the trigger point. Keep all follow-up visits. This is important. Contact a health care provider if: Your pain comes back, and it is worse than before the injection. You may need more injections. You have chills or a fever. The injection site becomes more painful, red, swollen, or warm to the touch. Summary A trigger point injection is a shot given in the trigger point to help relieve pain. Common places for trigger point injections are the neck, shoulders, upper back, and lower back. These injections do not always work for every person, but for some people, the injections can help to relieve pain for a few days to a few  months. Contact a health care provider if symptoms come back or if they are worse than before treatment. Also, get help if the injection site becomes more painful, red, swollen, or warm to the touch. This information is not intended to replace advice given to you by your health care provider. Make sure you discuss any questions you have with your health care provider. Document Revised: 06/11/2020 Document Reviewed: 06/11/2020 Elsevier Patient Education  [place] Discharge Instructions  General Discharge Instructions :  If you need to reach your doctor call: Monday-Friday 8:00 am - 4:00 pm at [phone] or toll free [phone].  After clinic hours [phone] to have operator reach doctor.  Bring all of your medication bottles to all your appointments in the pain clinic.  To cancel or reschedule your appointment with Pain Management please remember to call 24 hours in advance to avoid a fee.  Refer to the educational materials which you have been given on: General Risks, I had my Procedure. Discharge Instructions, Post Sedation.  Post Procedure Instructions:  The drugs you were given will stay in your system until tomorrow, so for the next 24 hours you should not drive, make any legal decisions or  drink any alcoholic beverages.  You may eat anything you prefer, but it is better to start with liquids then soups and crackers, and gradually work up to solid foods.  Please notify your doctor immediately if you have any unusual bleeding, trouble breathing or pain that is not related to your normal pain.  Depending on the type of procedure that was done, some parts of your body may feel week and/or numb.  This usually clears up by tonight or the next day.  Walk with the use of an assistive device or accompanied by an adult for the 24 hours.  You may use ice on the affected area for the first 24 hours.  Put ice in a Ziploc bag and cover with a towel and place  against area 15 minutes on 15 minutes off.  You may switch to heat after 24 hours.

## 2021-01-06 NOTE — Progress Notes (Signed)
PROVIDER NOTE: Information contained herein reflects review and annotations entered in association with encounter. Interpretation of such information and data should be left to medically-trained personnel. Information provided to patient can be located elsewhere in the medical record under "Patient Instructions". Document created using STT-dictation technology, any transcriptional errors that may result from process are unintentional.    Patient: Danielle Stephens  Service Category: Procedure Provider: Gillis Santa, MD DOB: 1950/08/10 DOS: 01/06/2021 Location: [facility] MRN: [id number] Setting: Ambulatory - outpatient Referring Provider: Gillis Santa, MD Type: Established Patient Specialty: Interventional Pain Management PCP: Venita Lick, NP  Primary Reason for Visit: Interventional Pain Management Treatment. CC: Shoulder Pain (Right  sp joint replacement )    Procedure:          Anesthesia, Analgesia, Anxiolysis:  Type: Diagnostic Suprascapular nerve Block #1  Primary Purpose: Diagnostic Region: Posterior Shoulder & Scapular Areas Level: Superior to the scapular spine, in the lateral aspect of the supraspinatus fossa (Suprescapular notch). Target Area: Suprascapular nerve as it passes thru the lower portion of the suprascapular notch. Approach: Posterior percutaneous approach. Laterality: Right-Side  Anesthesia: Local (1-2% Lidocaine)  Anxiolysis: Oral 5mg  PO Valium  Sedation: None  Guidance: Fluoroscopy           Position: Prone   Indications: 1. Chronic right shoulder pain   2. 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 Chronic pain syndrome    Pain Score: Pre-procedure: 5 /10 Post-procedure: 3 /10     Pre-op H&P Assessment:  Danielle Stephens is a 70 y.o. (year old), female patient, seen today for interventional treatment. 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 Danielle Stephens has a current medication list which includes the following prescription(s): acetaminophen, biotin, vitamin d, lisinopril, multivitamin, and tizanidine. Her primarily concern today is the Shoulder Pain (Right  sp joint replacement )  Initial Vital Signs:  Pulse/HCG Rate: 80ECG Heart Rate: 94 Temp: (!) 97.3 F (36.3 C) Resp: 16 BP: (!) 173/74 SpO2: 100 %  BMI: Estimated body mass index is 18.47 kg/m as calculated from the following:   Height as of this encounter: 5\' 2"  (1.575 m).   Weight as of this encounter: 101 lb (45.8 kg).  Risk Assessment: Allergies: Reviewed. She is allergic to codeine and tramadol.  Allergy Precautions: None required Coagulopathies: Reviewed. None identified.  Blood-thinner therapy: None at this time Active Infection(s): Reviewed. None identified. Danielle Stephens is afebrile  Site Confirmation: Danielle Stephens was asked to confirm the procedure and laterality before marking the site Procedure checklist: Completed Consent: Before the procedure and under the influence of no sedative(s), amnesic(s), or anxiolytics, the patient was informed of the treatment options, risks and possible complications. To fulfill our ethical and legal obligations, as recommended by the American Medical Association's Code of Ethics, I have informed the patient of my clinical impression; the nature and purpose of the treatment or procedure; the risks, benefits, and possible complications of the intervention; the alternatives, including doing nothing; the risk(s) and benefit(s) of the alternative treatment(s) or procedure(s); and the risk(s) and benefit(s) of doing nothing. The patient was provided information about the general risks and possible complications associated with the  procedure. These may include, but are not limited to: failure to achieve desired goals, infection,  bleeding, organ or nerve damage, allergic reactions, paralysis, and death. In addition, the patient was informed of those risks and complications associated to the procedure, such as failure to decrease pain; infection; bleeding; organ or nerve damage with subsequent damage to sensory, motor, and/or autonomic systems, resulting in permanent pain, numbness, and/or weakness of one or several areas of the body; allergic reactions; (i.e.: anaphylactic reaction); and/or death. Furthermore, the patient was informed of those risks and complications associated with the medications. These include, but are not limited to: allergic reactions (i.e.: anaphylactic or anaphylactoid reaction(s)); adrenal axis suppression; blood sugar elevation that in diabetics may result in ketoacidosis or comma; water retention that in patients with history of congestive heart failure may result in shortness of breath, pulmonary edema, and decompensation with resultant heart failure; weight gain; swelling or edema; medication-induced neural toxicity; particulate matter embolism and blood vessel occlusion with resultant organ, and/or nervous system infarction; and/or aseptic necrosis of one or more joints. Finally, the patient was informed that Medicine is not an exact science; therefore, there is also the possibility of unforeseen or unpredictable risks and/or possible complications that may result in a catastrophic outcome. The patient indicated having understood very clearly. We have given the patient no guarantees and we have made no promises. Enough time was given to the patient to ask questions, all of which were answered to the patient's satisfaction. Danielle Stephens has indicated that she wanted to continue with the procedure. Attestation: I, the ordering provider, attest that I have discussed with the patient the benefits, risks, side-effects,  alternatives, likelihood of achieving goals, and potential problems during recovery for the procedure that I have provided informed consent. Date  Time: 01/06/2021 10:31 AM  Pre-Procedure Preparation:  Monitoring: As per clinic protocol. Respiration, ETCO2, SpO2, BP, heart rate and rhythm monitor placed and checked for adequate function Safety Precautions: Patient was assessed for positional comfort and pressure points before starting the procedure. Time-out: I initiated and conducted the "Time-out" before starting the procedure, as per protocol. The patient was asked to participate by confirming the accuracy of the "Time Out" information. Verification of the correct person, site, and procedure were performed and confirmed by me, the nursing staff, and the patient. "Time-out" conducted as per Joint Commission's Universal Protocol (UP.01.01.01). Time: 1127  Description of Procedure:          Area Prepped: Entire shoulder Area DuraPrep (Iodine Povacrylex [0.7% available iodine] and Isopropyl Alcohol, 74% w/w) Safety Precautions: Aspiration looking for blood return was conducted prior to all injections. At no point did we inject any substances, as a needle was being advanced. No attempts were made at seeking any paresthesias. Safe injection practices and needle disposal techniques used. Medications properly checked for expiration dates. SDV (single dose vial) medications used. Description of the Procedure: Protocol guidelines were followed. The patient was placed in position over the procedure table. The target area was identified and the area prepped in the usual manner. Skin & deeper tissues infiltrated with local anesthetic. Appropriate amount of time allowed to pass for local anesthetics to take effect. The procedure needles were then advanced to the target area. Proper needle placement secured. Negative aspiration confirmed. Solution injected in intermittent fashion, asking for systemic symptoms  every 0.5cc of injectate. The needles were then removed and the area cleansed, making sure to leave some of the prepping solution back to take advantage of its long term bactericidal properties.  Vitals:   01/06/21 1042 01/06/21 1126 01/06/21 1131 01/06/21 1133  BP: (!) 173/74 (!) 140/92 137/83 (!) 144/94  Pulse: 80     Resp: 16 18 16 16   Temp: (!) 97.3 F (36.3 C)     TempSrc: Temporal     SpO2: 100% 100% 100% 100%  Weight: 101 lb (45.8 kg)     Height: 5\' 2"  (1.575 m)       Start Time: 1127 hrs. End Time: 1131 hrs. Materials:  Needle(s) Type: Spinal Needle Gauge: 22G Length: 3.5-in Medication(s): Please see orders for medications and dosing details. 5 cc solution made of 4cc of 0.2% ropivacaine, 1 cc of Decadron 10 mg/cc. Injected along right suprascapular nerve after contrast confirmation.  Imaging Guidance (Non-Spinal):          Type of Imaging Technique: Fluoroscopy Guidance (Non-Spinal) Indication(s): Assistance in needle guidance and placement for procedures requiring needle placement in or near specific anatomical locations not easily accessible without such assistance. Exposure Time: Please see nurses notes. Contrast: Before injecting any contrast, we confirmed that the patient did not have an allergy to iodine, shellfish, or radiological contrast. Once satisfactory needle placement was completed at the desired level, radiological contrast was injected. Contrast injected under live fluoroscopy. No contrast complications. See chart for type and volume of contrast used. Fluoroscopic Guidance: I was personally present during the use of fluoroscopy. "Tunnel Vision Technique" used to obtain the best possible view of the target area. Parallax error corrected before commencing the procedure. "Direction-depth-direction" technique used to introduce the needle under continuous pulsed fluoroscopy. Once target was reached, antero-posterior, oblique, and lateral fluoroscopic projection used  confirm needle placement in all planes. Images permanently stored in EMR. Interpretation: I personally interpreted the imaging intraoperatively. Adequate needle placement confirmed in multiple planes. Appropriate spread of contrast into desired area was observed. No evidence of afferent or efferent intravascular uptake. Permanent images saved into the patient's record.  Post-operative Assessment:  Post-procedure Vital Signs:  Pulse/HCG Rate: 8084 Temp:  (!) 97.3 F (36.3 C) Resp: 16 BP:  (!) 144/94 SpO2: 100 %  EBL: None  Complications: No immediate post-treatment complications observed by team, or reported by patient.  Note: The patient tolerated the entire procedure well. A repeat set of vitals were taken after the procedure and the patient was kept under observation following institutional policy, for this type of procedure. Post-procedural neurological assessment was performed, showing return to baseline, prior to discharge. The patient was provided with post-procedure discharge instructions, including a section on how to identify potential problems. Should any problems arise concerning this procedure, the patient was given instructions to immediately contact us, at any time, without hesitation. In any case, we plan to contact the patient by telephone for a follow-up status report regarding this interventional procedure.  Comments:  No additional relevant information.  Plan of Care  Orders:  Orders Placed This Encounter  Procedures   DG PAIN [facility] C-ARM 1-60 MIN NO REPORT    Intraoperative interpretation by procedural physician at [place].    Standing Status:   Standing    Number of Occurrences:   1    Order Specific Question:   Reason for exam:    Answer:   Assistance in needle guidance and placement for procedures requiring needle placement in or near specific anatomical locations not easily accessible without such assistance.     Medications ordered for  procedure: Meds ordered this encounter  Medications   lidocaine (XYLOCAINE) 2 % (with pres) injection 400 mg   ropivacaine (PF) 2 mg/mL (0.2%) (NAROPIN) injection  4 mL   dexamethasone (DECADRON) injection 10 mg   Medications administered: We administered lidocaine, ropivacaine (PF) 2 mg/mL (0.2%), and dexamethasone.  See the medical record for exact dosing, route, and time of administration.  Follow-up plan:   Return in about 5 weeks (around 02/10/2021) for Post Procedure Evaluation, virtual.     Right SSNB 01/06/21  Recent Visits Date Type Provider Dept  12/23/20 Office Visit Gillis Santa, MD [facility]  Showing recent visits within past 90 days and meeting all other requirements Today's Visits Date Type Provider Dept  01/06/21 Procedure visit Gillis Santa, MD [facility]  Showing today's visits and meeting all other requirements Future Appointments Date Type Provider Dept  01/28/21 Appointment Gillis Santa, MD [facility]  Showing future appointments within next 90 days and meeting all other requirements Disposition: Discharge home  Discharge (Date  Time): 01/06/2021; 1145 hrs.   Primary Care Physician: Venita Lick, NP Location: [facility] Note by: Gillis Santa, MD Date: 01/06/2021; Time: 12:06 PM  Disclaimer:  Medicine is not an exact science. The only guarantee in medicine is that nothing is guaranteed. It is important to note that the decision to proceed with this intervention was based on the information collected from the patient. The Data and conclusions were drawn from the patient's questionnaire, the interview, and the physical examination. Because the information was provided in large part by the patient, it cannot be guaranteed that it has not been purposely or unconsciously manipulated. Every effort has been made to obtain as much relevant data as possible for this evaluation. It is important to  note that the conclusions that lead to this procedure are derived in large part from the available data. Always take into account that the treatment will also be dependent on availability of resources and existing treatment guidelines, considered by other Pain Management Practitioners as being common knowledge and practice, at the time of the intervention. For Medico-Legal purposes, it is also important to point out that variation in procedural techniques and pharmacological choices are the acceptable norm. The indications, contraindications, technique, and results of the above procedure should only be interpreted and judged by a Board-Certified Interventional Pain Specialist with extensive familiarity and expertise in the same exact procedure and technique.

## 2021-01-07 ENCOUNTER — Telehealth: Payer: Self-pay

## 2021-01-07 NOTE — Telephone Encounter (Signed)
Post procedure phone call.  LM 

## 2021-01-07 NOTE — Telephone Encounter (Signed)
Patient called back in re; procedure and was describing s/s that are normal from the procedure.  Patient also asking about how to get the brown soap off.  Told her she may use alcohol but scrubbing will only make her skin read.  Patient verbalizes u/o information.

## 2021-01-28 ENCOUNTER — Ambulatory Visit
Payer: Medicare Other | Attending: Student in an Organized Health Care Education/Training Program | Admitting: Student in an Organized Health Care Education/Training Program

## 2021-01-28 ENCOUNTER — Other Ambulatory Visit: Payer: Self-pay

## 2021-01-28 ENCOUNTER — Encounter: Payer: Self-pay | Admitting: Student in an Organized Health Care Education/Training Program

## 2021-01-28 DIAGNOSIS — G8929 Other chronic pain: Secondary | ICD-10-CM

## 2021-01-28 DIAGNOSIS — Z96611 Presence of right artificial shoulder joint: Secondary | ICD-10-CM | POA: Diagnosis not present

## 2021-01-28 DIAGNOSIS — G894 Chronic pain syndrome: Secondary | ICD-10-CM

## 2021-01-28 DIAGNOSIS — M25511 Pain in right shoulder: Secondary | ICD-10-CM | POA: Diagnosis not present

## 2021-01-28 DIAGNOSIS — G5681 Other specified mononeuropathies of right upper limb: Secondary | ICD-10-CM

## 2021-01-28 NOTE — Progress Notes (Signed)
Patient: Danielle Stephens  Service Category: E/M  Provider: Gillis Santa, MD  DOB: Dec 30, 1950  DOS: 01/28/2021  Location: Office  MRN: [id number]  Setting: Ambulatory outpatient  Referring Provider: Venita Lick, NP  Type: Established Patient  Specialty: Interventional Pain Management  PCP: Venita Lick, NP  Location: Remote location  Delivery: TeleHealth     Virtual Encounter - Pain Management PROVIDER NOTE: Information contained herein reflects review and annotations entered in association with encounter. Interpretation of such information and data should be left to medically-trained personnel. Information provided to patient can be located elsewhere in the medical record under "Patient Instructions". Document created using STT-dictation technology, any transcriptional errors that may result from process are unintentional.    Contact & Pharmacy Preferred: [phone] Home: [phone] (home) Mobile: [phone] (mobile) E-mail: [email or web address], [place] [address] [place] [address] Phone: [phone] Fax: [phone]   Pre-screening  Danielle Stephens offered "in-person" vs "virtual" encounter. She indicated preferring virtual for this encounter.   Reason COVID-19*  Social distancing based on CDC and AMA recommendations.   I contacted Danielle Stephens on 01/28/2021 via telephone.      I clearly identified myself as Gillis Santa, MD. I verified that I was speaking with the correct person using two identifiers (Name: Danielle Stephens, and date of birth: Jul 16, 1950).  Consent I sought verbal advanced consent from Danielle Stephens for virtual visit interactions. I informed Danielle Stephens of possible security and privacy concerns, risks, and limitations associated with providing "not-in-person" medical evaluation and management services. I also informed Danielle Stephens of the availability of "in-person" appointments. Finally, I informed her that there  would be a charge for the virtual visit and that she could be  personally, fully or partially, financially responsible for it. Danielle Stephens expressed understanding and agreed to proceed.   Historic Elements   Danielle Stephens is a 70 y.o. year old, female patient evaluated today after our last contact on 01/06/2021. Danielle Stephens  has a past medical history of Anxiety, Chronic kidney disease, Hypertension, and Shoulder pain. 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 Danielle Stephens has a current medication list which includes the following prescription(s): acetaminophen, biotin, vitamin d, lisinopril, multivitamin, and tizanidine. She  reports that she has never smoked. She has never used smokeless tobacco. She reports that she does not drink alcohol and does not use drugs. Danielle Stephens is allergic to codeine and tramadol.   HPI  Today, she is being contacted for a post-procedure assessment.   Post-Procedure Evaluation  Procedure (01/06/2021):   Procedure:          Anesthesia, Analgesia, Anxiolysis:  Type: Diagnostic Suprascapular nerve Block #1  Primary Purpose: Diagnostic Region: Posterior Shoulder & Scapular Areas Level: Superior to the scapular spine, in the lateral aspect of the supraspinatus fossa (Suprescapular notch). Target Area: Suprascapular nerve as it passes thru the lower portion of the suprascapular notch. Approach: Posterior percutaneous approach. Laterality: Right-Side  Anesthesia: Local (1-2% Lidocaine)  Anxiolysis: Oral 8m PO Valium  Sedation: None  Guidance: Fluoroscopy           Position: Prone   Indications: 1. Chronic right shoulder pain   2. History of right shoulder replacement   3. Suprascapular entrapment  neuropathy  of right side   4. Chronic pain syndrome    Pain Score: Pre-procedure: 5 /10 Post-procedure: 3 /10     Anxiolysis: Please see nurses note.  Effectiveness during initial hour after procedure (Ultra-Short Term Relief): 0 %   Local anesthetic used: Long-acting (4-6 hours) Effectiveness: Defined as any analgesic benefit obtained secondary to the administration of local anesthetics. This carries significant diagnostic value as to the etiological location, or anatomical origin, of the pain. Duration of benefit is expected to coincide with the duration of the local anesthetic used.  Effectiveness during initial 4-6 hours after procedure (Short-Term Relief): 0 % (took several days for the pain relief to kick in.)   Long-term benefit: Defined as any relief past the pharmacologic duration of the local anesthetics.  Effectiveness past the initial 6 hours after procedure (Long-Term Relief): 80 % (patient continues to have the pain in the tip of  shoulder like a burning sensation and pain and follows the outside of her arm to the elbow. the more activity the more severe the pain.)   Benefits, current: Defined as benefit present at the time of this evaluation.   Analgesia:  60-65% however pain is returning and patient would like to consider repeating prior to Christmas Function:  improved somewhat    Laboratory Chemistry Profile   Renal Lab Results  Component Value Date   BUN 21 05/20/2020   CREATININE 0.90 05/20/2020   BCR 19 02/16/2020   GFRAA 71 02/16/2020   GFRNONAA >60 05/16/2020    Hepatic Lab Results  Component Value Date   AST 19 09/21/2019   ALT 12 09/21/2019   ALBUMIN 4.5 09/21/2019   ALKPHOS 78 09/21/2019    Electrolytes Lab Results  Component Value Date   NA 142 05/20/2020   K 3.9 05/20/2020   CL 103 05/20/2020   CALCIUM 9.7 05/16/2020    Bone Lab Results  Component Value Date   VD25OH 36.9 02/16/2020    Inflammation (CRP: Acute Phase) (ESR: Chronic Phase) No  results found for: CRP, ESRSEDRATE, LATICACIDVEN       Note: Above Lab results reviewed.   Assessment  The primary encounter diagnosis was Chronic right shoulder pain. Diagnoses of History of right shoulder replacement, Suprascapular entrapment neuropathy of right side, and Chronic pain syndrome were also pertinent to this visit.  Plan of Care    Danielle Stephens has a current medication list which includes the following long-term medication(s): lisinopril.  Repeat suprascapular nerve block #2 without sedation.  Orders:  Orders Placed This Encounter  Procedures   SUPRASCAPULAR NERVE BLOCK    For shoulder pain.    Standing Status:   Future    Standing Expiration Date:   04/30/2021    Scheduling Instructions:     Right #2 without sedation    Order Specific Question:   Where will this procedure be performed?    Answer:   [facility]    Follow-up plan:   Return in about 4 weeks (around 02/25/2021) for R SSNB #2 , without sedation.     Right SSNB 01/06/21   Recent Visits Date Type Provider Dept  01/06/21 Procedure visit Gillis Santa, MD [facility]  12/23/20 Office Visit Gillis Santa, MD [facility]  Showing recent visits within past 90 days and meeting all other requirements Today's Visits Date Type Provider Dept  01/28/21 Office Visit Gillis Santa, MD [facility]  Showing today's visits and meeting all other requirements Future Appointments  No visits were found meeting these conditions. Showing future appointments within next 90 days and meeting all other requirements I discussed the assessment and treatment plan with the patient. The patient was provided an opportunity to ask questions and all were answered. The patient agreed with the plan and demonstrated an understanding of the instructions.  Patient advised to call back or seek an in-person evaluation if the symptoms or condition worsens.  Duration of encounter:  9mnutes.  Note by: BGillis Santa MD Date: 01/28/2021; Time: 3:31 PM

## 2021-01-30 ENCOUNTER — Other Ambulatory Visit: Payer: Self-pay

## 2021-01-30 ENCOUNTER — Ambulatory Visit (INDEPENDENT_AMBULATORY_CARE_PROVIDER_SITE_OTHER): Payer: Medicare Other | Admitting: Neurology

## 2021-01-30 DIAGNOSIS — R202 Paresthesia of skin: Secondary | ICD-10-CM | POA: Diagnosis not present

## 2021-01-30 DIAGNOSIS — M79601 Pain in right arm: Secondary | ICD-10-CM

## 2021-01-30 NOTE — Procedures (Signed)
[facility] Neurology  [place]  [place], [place] [address] Tel: [phone] Fax:  [phone] Test Date:  01/30/2021  Patient: Danielle Stephens DOB: [birth year] Physician: Narda Amber, DO  Sex: Female Height: 5\' 2"  Ref Phys: Hiram Gash, MD  ID#: [id number]   Technician:    Patient Complaints: This is a 70 year old female with history of right shoulder surgery referred for evaluation of right shoulder and arm pain.  NCV & EMG Findings: Extensive electrodiagnostic testing of the right upper extremity shows:  Right median, ulnar, radial, and mixed palmar sensory responses are within normal limits. Right median, ulnar, and axillary motor responses are within normal limits. There is no evidence of active or chronic motor axonal loss changes affecting any of the tested muscles.  Motor unit configuration and recruitment pattern is within normal limits.  Impression: This is a normal study of the right upper extremity.  In particular, there is no evidence of a cervical radiculopathy or axillary mononeuropathy.   ___________________________ Narda Amber, DO    Nerve Conduction Studies Anti Sensory Summary Table   Stim Site NR Peak (ms) Norm Peak (ms) P-T Amp (V) Norm P-T Amp  Right Median Anti Sensory (2nd Digit)  33C  Wrist    3.3 <3.8 38.7 >10  Right Radial Anti Sensory (Base 1st Digit)  33C  Wrist    2.3 <2.8 27.6 >10  Right Ulnar Anti Sensory (5th Digit)  33C  Wrist    2.9 <3.2 36.9 >5   Motor Summary Table   Stim Site NR Onset (ms) Norm Onset (ms) O-P Amp (mV) Norm O-P Amp Site1 Site2 Delta-0 (ms) Dist (cm) Vel (m/s) Norm Vel (m/s)  Right Axillary Motor (Deltoid)  33C  Clavicle    3.8 <5.0 6.9 >3        Right Median Motor (Abd Poll Brev)  33C  Wrist    3.0 <4.0 8.2 >5 Elbow Wrist 4.7 27.0 57 >50  Elbow    7.7  7.8         Right Ulnar Motor (Abd Dig Minimi)  33C  Wrist    2.8 <3.1 9.8 >7 B Elbow Wrist 3.2 21.0 66 >50  B Elbow    6.0  9.1  A Elbow B  Elbow 1.5 10.0 67 >50  A Elbow    7.5  8.8          Comparison Summary Table   Stim Site NR Peak (ms) Norm Peak (ms) P-T Amp (V) Site1 Site2 Delta-P (ms) Norm Delta (ms)  Right Median/Ulnar Palm Comparison (Wrist - 8cm)  33C  Median Palm    1.9 <2.2 65.1 Median Palm Ulnar Palm 0.1   Ulnar Palm    1.8 <2.2 22.3       EMG   Side Muscle Ins Act Fibs Psw Fasc Number Recrt Dur Dur. Amp Amp. Poly Poly. Comment  Right 1stDorInt Nml Nml Nml Nml Nml Nml Nml Nml Nml Nml Nml Nml N/A  Right PronatorTeres Nml Nml Nml Nml Nml Nml Nml Nml Nml Nml Nml Nml N/A  Right Biceps Nml Nml Nml Nml Nml Nml Nml Nml Nml Nml Nml Nml N/A  Right Triceps Nml Nml Nml Nml Nml Nml Nml Nml Nml Nml Nml Nml N/A  Right Deltoid Nml Nml Nml Nml Nml Nml Nml Nml Nml Nml Nml Nml N/A  Right Infraspinatus Nml Nml Nml Nml Nml Nml Nml Nml Nml Nml Nml Nml N/A      Waveforms:

## 2021-03-18 ENCOUNTER — Other Ambulatory Visit: Payer: Self-pay | Admitting: Nurse Practitioner

## 2021-03-19 NOTE — Telephone Encounter (Signed)
Requested medication (s) are due for refill today:   Yes  Requested medication (s) are on the active medication list:   Yes  Future visit scheduled:   No   Last ordered: 02/16/2020 #90, 4 refills  Returned because protocol failed due to lab work.   Requested Prescriptions  Pending Prescriptions Disp Refills   lisinopril (ZESTRIL) 20 MG tablet [Pharmacy Med Name: Lisinopril 20 MG Oral Tablet] 90 tablet 0    Sig: Take 1 tablet by mouth once daily     Cardiovascular:  ACE Inhibitors Failed - 03/18/2021 11:11 AM      Failed - Cr in normal range and within 180 days    Creatinine, Ser  Date Value Ref Range Status  05/20/2020 0.90 0.44 - 1.00 mg/dL Final          Failed - K in normal range and within 180 days    Potassium  Date Value Ref Range Status  05/20/2020 3.9 3.5 - 5.1 mmol/L Final          Failed - Last BP in normal range    BP Readings from Last 1 Encounters:  01/06/21 (!) 144/94          Passed - Patient is not pregnant      Passed - Valid encounter within last 6 months    Recent Outpatient Visits           5 months ago Campbell Vigg, Avanti, MD   1 year ago Stage 3a chronic kidney disease ([place])   [place], Barbaraann Faster, NP   1 year ago Left foot pain   Hettinger, Willis T, NP   1 year ago Encounter for annual physical exam   [place], Henrine Screws T, NP   1 year ago Benign essential HTN   [place], Barbaraann Faster, NP       Future Appointments             In 7 months [employer]

## 2021-03-19 NOTE — Telephone Encounter (Signed)
Pt is scheduled for 1/30

## 2021-04-11 NOTE — Patient Instructions (Addendum)
Raynaud's Phenomenon Raynaud's phenomenon is a condition that affects the blood vessels (arteries) that carry blood to the fingers and toes. The arteries that supply blood to the ears, lips, nipples, or the tip of the nose might also be affected. Raynaud's phenomenon causes the arteries to become narrow temporarily (spasm). As a result, the flow of blood to the affected areas is temporarily decreased. This usually occurs in response to cold temperatures or stress. During an attack, the skin in the affected areas turns white, then blue, and finally red. A person may also feel tingling or numbness in those areas. Attacks usually last for only a brief period, and then the blood flow to the area returns to normal. In most cases, Raynaud's phenomenon does not cause serious health problems. What are the causes? In many cases, the cause of this condition is not known. The condition may occur on its own (primary Raynaud's phenomenon) or may be associated with other diseases or factors (secondary Raynaud's phenomenon). Possible causes may include: Diseases or medical conditions that damage the arteries. Injuries and repetitive actions that hurt the hands or feet. Being exposed to certain chemicals. Taking medicines that narrow the arteries. Other medical conditions, such as lupus, scleroderma, rheumatoid arthritis, thyroid problems, blood disorders, Sjogren syndrome, or atherosclerosis. What increases the risk? The following factors may make you more likely to develop this condition: Being 72-47 years old. Being female. Having a family history of Raynaud's phenomenon. Living in a cold climate. Smoking. What are the signs or symptoms? Symptoms of this condition usually occur when you are exposed to cold temperatures or when you have emotional stress. The symptoms may last for a few minutes or up to several hours. They usually affect your fingers but may also affect your toes, nipples, lips, ears, or the tip  of your nose. Symptoms may include: Changes in skin color. The skin in the affected areas will turn pale or white. The skin may then change from white to bluish to red as normal blood flow returns to the area. Numbness, tingling, or pain in the affected areas. In severe cases, symptoms may include: Skin sores. Tissues decaying and dying (gangrene). How is this diagnosed? This condition may be diagnosed based on: Your symptoms and medical history. A physical exam. During the exam, you may be asked to put your hands in cold water to check for a reaction to cold temperature. Tests, such as: Blood tests to check for other diseases or conditions. A test to check the movement of blood through your arteries and veins (vascular ultrasound). A test in which the skin at the base of your fingernail is examined under a microscope (nailfold capillaroscopy). How is this treated? During an episode, you can take actions to help symptoms go away faster. Options include moving your arms around in a windmill pattern, warming your fingers under warm water, or placing your fingers in a warm body fold, such as your armpit. Long-term treatment for this condition often involves making lifestyle changes and taking steps to control your exposure to cold temperature. For more severe cases, medicine (calcium channel blockers) may be used to improve blood circulation. Follow these instructions at home: Avoiding cold temperatures Take these steps to avoid exposure to cold: If possible, stay indoors during cold weather. When you go outside during cold weather, dress in layers and wear mittens, a hat, a scarf, and warm footwear. Wear mittens or gloves when handling ice or frozen food. Use holders for glasses or cans containing cold  drinks. Let warm water run for a while before taking a shower or bath. Warm up the car before driving in cold weather. Lifestyle If possible, avoid stressful and emotional situations. Try to  find ways to manage your stress, such as: Exercise. Yoga. Meditation. Biofeedback. Do not use any products that contain nicotine or tobacco. These products include cigarettes, chewing tobacco, and vaping devices, such as e-cigarettes. If you need help quitting, ask your health care provider. Avoid secondhand smoke. Limit your use of caffeine. Switch to decaffeinated coffee, tea, and soda. Avoid chocolate. Avoid vibrating tools and machinery. General instructions Protect your hands and feet from injuries, cuts, or bruises. Avoid wearing tight rings or wristbands. Wear loose fitting socks and comfortable, roomy shoes. Take over-the-counter and prescription medicines only as told by your health care provider. Where to find support Raynaud's Association: www.raynauds.org Where to find more information [employer] of Arthritis and Musculoskeletal and Skin Diseases: www.niams.SouthExposed.es Contact a health care provider if: Your discomfort becomes worse despite lifestyle changes. You develop sores on your fingers or toes that do not heal. You have breaks in the skin on your fingers or toes. You have a fever. You have pain or swelling in your joints. You have a rash. Your symptoms occur on only one side of your body. Get help right away if: Your fingers or toes turn black. You have severe pain in the affected areas. These symptoms may represent a serious problem that is an emergency. Do not wait to see if the symptoms will go away. Get medical help right away. Call your local emergency services (911 in the [place] Do not drive yourself to the hospital. Summary Raynaud's phenomenon is a condition that affects the arteries that carry blood to the fingers, toes, ears, lips, nipples, or the tip of the nose. In many cases, the cause of this condition is not known. Symptoms of this condition include changes in skin color along with numbness and tingling in the affected area. Treatment for this  condition includes lifestyle changes and reducing exposure to cold temperatures. Medicines may be used for severe cases of the condition. Contact your health care provider if your condition worsens despite treatment. This information is not intended to replace advice given to you by your health care provider. Make sure you discuss any questions you have with your health care provider. Document Revised: 05/07/2020 Document Reviewed: 05/07/2020 Elsevier Patient Education  2022 [employer].  Premature Atrial Contraction A premature atrial contraction [facility]) is a kind of irregular heartbeat (arrhythmia). It happens when the heart beats too early and then pauses before beating again. The heart has four areas, or chambers. Normally, electrical signals spread across the heart and make all the chambers beat together. During a PAC, the upper chambers of the heart (atria) beat too early, before they have had time to fill with blood. The heartbeat pauses afterward so the heart can fill with blood for the next beat. Sometimes PAC can be a warning sign of another type of arrhythmia called atrial fibrillation. Atrial fibrillation may allow blood to pool in the atria and form clots. If a clot travels to the brain, it can cause a stroke. What are the causes? The cause of this condition is often unknown. Sometimes, this condition may be caused by heart disease or injury to the heart. What increases the risk? You are more likely to develop this condition if: You are a child. You are an adult who is 19 years of age or older.  Episodes may be triggered by: Caffeine. Alcohol. Tobacco use. Stimulant drugs. Some medicines or supplements. Stress. Heart disease. What are the signs or symptoms? Symptoms of this condition include: A feeling that your heart skipped a beat. The first heartbeat after the "skipped" beat may feel more forceful. A feeling that your heart is fluttering. How is this diagnosed? This  condition is diagnosed based on: Your symptoms. A physical exam. Your health care provider may listen to your heart. An electrocardiogram (ECG). This is a test that records the electrical impulses of the heart. An ambulatory cardiac monitor. This device records your heartbeats for 24 hours or more. You may also have: An echocardiogram to check for any heart conditions. This is a type of imaging test that uses sound waves (ultrasound) to make images of your heart. Blood tests. How is this treated? Treatment depends on the frequency of your symptoms and other risk factors. Treatments may include: Medicines (beta-blockers). Catheter ablation. This is done to destroy the part of the heart tissue that sends abnormal signals. In some cases, treatment may not be needed for this condition. Follow these instructions at home: Lifestyle Do not use any products that contain nicotine or tobacco, such as cigarettes, e-cigarettes, and chewing tobacco. If you need help quitting, ask your health care provider. Exercise regularly. Ask your health care provider what type of exercise is safe for you. Find healthy ways to manage stress. Try to get at least 7-9 hours of sleep each night, or as much as recommended by your health care provider. Alcohol use Do not drink alcohol if: Your health care provider tells you not to drink. You are pregnant, may be pregnant, or are planning to become pregnant. Alcohol triggers your episodes. If you drink alcohol: Limit how much you use to: 0-1 drink a day for women. 0-2 drinks a day for men. Be aware of how much alcohol is in your drink. In the [place] one drink equals one 12 oz bottle of beer (355 mL), one 5 oz glass of wine (148 mL), or one 1 oz glass of hard liquor (44 mL). General instructions Take over-the-counter and prescription medicines only as told by your health care provider. If caffeine triggers episodes, do not eat, drink, or use anything with caffeine in  it. Keep all follow-up visits as told by your health care provider. This is important. Contact a health care provider if: You feel your heart skipping beats. Your heart skips beats and you feel dizzy, light-headed, or very tired. Get help right away if you have: Chest pain. Trouble breathing. Any symptoms of a stroke. "BE FAST" is an easy way to remember the main warning signs of a stroke. B - Balance. Signs are dizziness, sudden trouble walking, or loss of balance. E - Eyes. Signs are trouble seeing or a sudden change in vision. F - Face. Signs are sudden weakness or numbness of the face, or the face or eyelid drooping on one side. A - Arms. Signs are weakness or numbness in an arm. This happens suddenly and usually on one side of the body. S - Speech. Signs are sudden trouble speaking, slurred speech, or trouble understanding what people say. T - Time. Time to call emergency services. Write down what time symptoms started. Other signs of stroke, such as: A sudden, severe headache with no known cause. Nausea or vomiting. Seizure. These symptoms may represent a serious problem that is an emergency. Do not wait to see if the symptoms will go away.  Get medical help right away. Call your local emergency services (911 in the [place] Do not drive yourself to the hospital. Summary A premature atrial contraction [facility]) is a kind of irregular heartbeat (arrhythmia). It happens when the heart beats too early and then pauses before beating again. Treatment depends on your symptoms and whether you have other underlying heart conditions. Contact a health care provider if your heart skips beats and you feel dizzy, light-headed, or very tired. In some cases, this condition may lead to a stroke. "BE FAST" is an easy way to remember the warning signs of stroke. Get help right away if you have any of the "BE FAST" signs. This information is not intended to replace advice given to you by your health care  provider. Make sure you discuss any questions you have with your health care provider. Document Revised: 07/30/2020 Document Reviewed: 07/30/2020 Elsevier Patient Education  [place].

## 2021-04-14 ENCOUNTER — Other Ambulatory Visit: Payer: Self-pay

## 2021-04-14 ENCOUNTER — Ambulatory Visit (INDEPENDENT_AMBULATORY_CARE_PROVIDER_SITE_OTHER): Payer: Medicare Other | Admitting: Nurse Practitioner

## 2021-04-14 ENCOUNTER — Encounter: Payer: Self-pay | Admitting: Nurse Practitioner

## 2021-04-14 VITALS — BP 144/78 | HR 72 | Temp 97.8°F | Ht 62.0 in | Wt 104.8 lb

## 2021-04-14 DIAGNOSIS — Z23 Encounter for immunization: Secondary | ICD-10-CM | POA: Diagnosis not present

## 2021-04-14 DIAGNOSIS — N1831 Chronic kidney disease, stage 3a: Secondary | ICD-10-CM | POA: Diagnosis not present

## 2021-04-14 DIAGNOSIS — I491 Atrial premature depolarization: Secondary | ICD-10-CM

## 2021-04-14 DIAGNOSIS — E7800 Pure hypercholesterolemia, unspecified: Secondary | ICD-10-CM | POA: Diagnosis not present

## 2021-04-14 DIAGNOSIS — M8588 Other specified disorders of bone density and structure, other site: Secondary | ICD-10-CM | POA: Diagnosis not present

## 2021-04-14 DIAGNOSIS — I10 Essential (primary) hypertension: Secondary | ICD-10-CM

## 2021-04-14 DIAGNOSIS — M79644 Pain in right finger(s): Secondary | ICD-10-CM | POA: Insufficient documentation

## 2021-04-14 DIAGNOSIS — M25511 Pain in right shoulder: Secondary | ICD-10-CM

## 2021-04-14 DIAGNOSIS — G8929 Other chronic pain: Secondary | ICD-10-CM

## 2021-04-14 DIAGNOSIS — E559 Vitamin D deficiency, unspecified: Secondary | ICD-10-CM

## 2021-04-14 LAB — MICROALBUMIN, URINE WAIVED
Creatinine, Urine Waived: 10 mg/dL (ref 10–300)
Microalb, Ur Waived: 10 mg/L (ref 0–19)
Microalb/Creat Ratio: <30 mg/g (ref <30)

## 2021-04-14 MED ORDER — AMLODIPINE BESYLATE 2.5 MG PO TABS: 2.5000 mg | ORAL_TABLET | Freq: Every day | ORAL | 4 refills | Status: DC

## 2021-04-14 NOTE — Assessment & Plan Note (Signed)
Chronic, ongoing -- continue collaboration with ortho and pain management.  Recent notes reviewed.

## 2021-04-14 NOTE — Assessment & Plan Note (Signed)
Ongoing and noted on labs -- ASCVD 13.1%.  Recheck lipid panel today and initiate statin as needed.

## 2021-04-14 NOTE — Progress Notes (Signed)
BP (!) 144/78    Pulse 72    Temp 97.8 F (36.6 C) (Oral)    Ht 5' 2"  (1.575 m)    Wt 104 lb 12.8 oz (47.5 kg)    SpO2 100%    BMI 19.17 kg/m    Subjective:    Patient ID: Danielle Stephens, female    DOB: 14-Dec-1950, 70 y.o.   MRN: [id number]  HPI: Danielle Stephens is a 71 y.o. female  Chief Complaint  Patient presents with   Chronic Kidney Disease   Hypertension   Shoulder Pain    Patient states it will be a year in March since her last surgery for her complete rotator cuff. Patient states she sees pain management and they are offering another shoulder injection and she does not like the injection. Patient states she does not take any medication currently for her pain or discomfort. Patient states the pain is constant.    Finger Injury    Patient states she noticed for the past couple of months. Patient has noticed that her R index finger is turning blue/purple. Patient states it started with her thumb on her R hand and then lead to her index finger. Patient states it will start off color changing, numbness and tingling.    HYPERTENSION Continues on Lisinopril 20 MG daily.   Hypertension status: stable  Satisfied with current treatment? yes Duration of hypertension: chronic BP monitoring frequency:  rarely BP range: occasionally higher, most of the time is "okay" BP medication side effects:  no Medication compliance: good compliance Previous BP meds: Lisinopril Aspirin: no Recurrent headaches: no Visual changes: no Palpitations: no Dyspnea: no Chest pain: no Lower extremity edema: none Dizzy/lightheaded: no  The 10-year ASCVD risk score (Arnett DK, et al., 2019) is: 13.1%   Values used to calculate the score:     Age: 15 years     Sex: Female     Is Non-Hispanic African American: No     Diabetic: No     Tobacco smoker: No     Systolic Blood Pressure: 846 mmHg     Is BP treated: Yes     HDL Cholesterol: 75 mg/dL     Total Cholesterol: 267 mg/dL   CHRONIC KIDNEY  DISEASE Recent labs stable. CKD status: stable Medications renally dose: yes Previous renal evaluation: no Pneumovax:  Up to Date Influenza Vaccine:  Up to Date   OSTEOPENIA Noted on DEXA 12/07/19 with T score -2.2, continues on supplements at home. Satisfied with current treatment?: yes Adequate calcium & vitamin D: yes Intolerance to bisphosphonates: none taken at this time Weight bearing exercises: yes  HAND PAIN She reports that her right index finger turns purple and tingles every single day for 5-10 minutes then goes away.  Comes back sometimes 2-3 times a day.  Notices it any time, not just cold weather.  This started presenting after her surgeries.  Initially started out with the right thumb and then trended to index finger -- ongoing for 6 months.  Left index finger sometimes goes completely white.  Is being followed by pain management for right shoulder pain (has had 3 surgeries on the shoulder) -- was getting shoulder injections, but reports no benefit from this -- last November 17th, this wore off in 3-4 days.  Has ongoing chronic pain to this shoulder, plans on returning to [place] who she reports was assessing this pain further. Duration: months Involved hand: right Mechanism of injury: unknown Aggravating factors:  unknown Alleviating factors: unknown Treatments attempted: none Relief with NSAIDs?: No NSAIDs Taken Weakness: no Numbness: yes only when it changes color Redness: no Swelling:no Bruising: no Fevers: no   Relevant past medical, surgical, family and social history reviewed and updated as indicated. Interim medical history since our last visit reviewed. Allergies and medications reviewed and updated.  Review of Systems  Constitutional:  Negative for activity change, appetite change, diaphoresis, fatigue and fever.  Respiratory:  Negative for cough, chest tightness and shortness of breath.   Cardiovascular:  Negative for chest pain, palpitations  and leg swelling.  Gastrointestinal: Negative.   Musculoskeletal:  Positive for arthralgias.  Neurological: Negative.   Psychiatric/Behavioral: Negative.     Per HPI unless specifically indicated above     Objective:    BP (!) 144/78    Pulse 72    Temp 97.8 F (36.6 C) (Oral)    Ht 5' 2"  (1.575 m)    Wt 104 lb 12.8 oz (47.5 kg)    SpO2 100%    BMI 19.17 kg/m   Wt Readings from Last 3 Encounters:  04/14/21 104 lb 12.8 oz (47.5 kg)  01/06/21 101 lb (45.8 kg)  12/23/20 101 lb (45.8 kg)    Physical Exam Vitals and nursing note reviewed.  Constitutional:      General: She is awake. She is not in acute distress.    Appearance: She is well-developed and well-groomed. She is not ill-appearing.  HENT:     Head: Normocephalic.     Right Ear: Hearing normal.     Left Ear: Hearing normal.  Eyes:     General: Lids are normal.        Right eye: No discharge.        Left eye: No discharge.     Conjunctiva/sclera: Conjunctivae normal.     Pupils: Pupils are equal, round, and reactive to light.  Neck:     Thyroid: No thyromegaly.     Vascular: No carotid bruit.     Comments: No bruits auscultated. Cardiovascular:     Rate and Rhythm: Normal rate. Rhythm irregular.     Heart sounds: Normal heart sounds. No murmur heard.   No gallop.  Pulmonary:     Effort: Pulmonary effort is normal.     Breath sounds: Normal breath sounds.  Abdominal:     General: Bowel sounds are normal.     Palpations: Abdomen is soft.  Musculoskeletal:     Right shoulder: No swelling, laceration, tenderness, bony tenderness or crepitus. Decreased range of motion. Normal strength.     Left shoulder: Normal.     Right hand: Normal.     Left hand: Normal.     Cervical back: Normal range of motion and neck supple.     Right lower leg: No edema.     Left lower leg: No edema.  Skin:    General: Skin is warm and dry.  Neurological:     Mental Status: She is alert and oriented to person, place, and time.   Psychiatric:        Attention and Perception: Attention normal.        Mood and Affect: Mood normal.        Speech: Speech normal.        Behavior: Behavior normal. Behavior is cooperative.        Thought Content: Thought content normal.   EKG My review and personal interpretation at Time: 1600  Indication: irregular HR Rate: 80  Rhythm: sinus Axis: normal Other: no nonspecific st abn, no stemi, no lvh -- frequent PAC noted.  Results for orders placed or performed in visit on 04/14/21  Microalbumin, Urine Waived  Result Value Ref Range   Microalb, Ur Waived 10 0 - 19 mg/L   Creatinine, Urine Waived 10 10 - 300 mg/dL   Microalb/Creat Ratio <30 <30 mg/g      Assessment & Plan:   Problem List Items Addressed This Visit       Cardiovascular and Mediastinum   Benign essential HTN    Chronic, stable.  BP above goal on initial and recheck today.  Will continue Lisinopril 20 MG daily and add on Amlodipine 2.5 MG daily, which may benefit both BP and possible Raynaud's.  Recommend she monitor BP at least a few mornings a week at home and document.  DASH diet at home.  Labs today: CBC, CMP, TSH, urine ALB, lipid.  Return in 4 weeks.       Relevant Medications   amLODipine (NORVASC) 2.5 MG tablet   Other Relevant Orders   Comprehensive metabolic panel   TSH   PAC (premature atrial contraction)    Noted on EKG and noted on exam today.  Reviewed with patient.  At this time she wishes to hold off on cardiology, is asymptomatic.  Consider this referral in future.  Monitor closely.  Could consider low dose BB in future.  Recommend she cut back on caffeine intake and reduce stressors.      Relevant Medications   amLODipine (NORVASC) 2.5 MG tablet   Other Relevant Orders   EKG 12-Lead (Completed)     Musculoskeletal and Integument   Osteopenia    Noted on DEXA 12/07/19 with T score -2.2.  Continue daily Vitamin D and check level today.  Next scan 12/06/2024.      Relevant Orders    VITAMIN D 25 Hydroxy (Vit-D Deficiency, Fractures)     Genitourinary   CKD (chronic kidney disease) stage 3, GFR 30-59 ml/min (HCC) - Primary    Improved last check and on Lisinopril.  Recheck CMP and urine ALB today.      Relevant Orders   Microalbumin, Urine Waived (Completed)   Comprehensive metabolic panel   CBC with Differential/Platelet     Other   Chronic right shoulder pain    Chronic, ongoing -- continue collaboration with ortho and pain management.  Recent notes reviewed.      Elevated LDL cholesterol level    Ongoing and noted on labs -- ASCVD 13.1%.  Recheck lipid panel today and initiate statin as needed.      Relevant Orders   Comprehensive metabolic panel   Lipid Panel w/o Chol/HDL Ratio   Pain of finger of right hand    ?Raynaud's.  Will obtain labs today ANA, CBC, CMP, uric acid, CRP, ESR.  Start a low dose of Amlodipine which would benefit both BP elevation and possibly benefit finger issues.  Plan on return in 4 weeks and further assess.        Relevant Orders   ANA w/Reflex if Positive   C-reactive protein   Sed Rate (ESR)   Vitamin D deficiency    Noted on past labs with osteopenia.  Continue supplement and check Vit D level today.      Relevant Orders   VITAMIN D 25 Hydroxy (Vit-D Deficiency, Fractures)   Other Visit Diagnoses     Flu vaccine need       Flu vaccine  today.   Relevant Orders   Flu Vaccine QUAD High Dose(Fluad) (Completed)        Follow up plan: Return in about 4 weeks (around 05/12/2021) for Raynauds, BP, and PACs.

## 2021-04-14 NOTE — Assessment & Plan Note (Signed)
?  Raynaud's.  Will obtain labs today ANA, CBC, CMP, uric acid, CRP, ESR.  Start a low dose of Amlodipine which would benefit both BP elevation and possibly benefit finger issues.  Plan on return in 4 weeks and further assess.

## 2021-04-14 NOTE — Assessment & Plan Note (Signed)
Chronic, stable.  BP above goal on initial and recheck today.  Will continue Lisinopril 20 MG daily and add on Amlodipine 2.5 MG daily, which may benefit both BP and possible Raynaud's.  Recommend she monitor BP at least a few mornings a week at home and document.  DASH diet at home.  Labs today: CBC, CMP, TSH, urine ALB, lipid.  Return in 4 weeks.

## 2021-04-14 NOTE — Assessment & Plan Note (Signed)
Noted on EKG and noted on exam today.  Reviewed with patient.  At this time she wishes to hold off on cardiology, is asymptomatic.  Consider this referral in future.  Monitor closely.  Could consider low dose BB in future.  Recommend she cut back on caffeine intake and reduce stressors.

## 2021-04-14 NOTE — Assessment & Plan Note (Signed)
Noted on past labs with osteopenia.  Continue supplement and check Vit D level today. 

## 2021-04-14 NOTE — Assessment & Plan Note (Addendum)
Improved last check and on Lisinopril.  Recheck CMP and urine ALB today.

## 2021-04-14 NOTE — Assessment & Plan Note (Signed)
Noted on DEXA 12/07/19 with T score -2.2.  Continue daily Vitamin D and check level today.  Next scan 12/06/2024.

## 2021-04-15 ENCOUNTER — Other Ambulatory Visit: Payer: Self-pay | Admitting: Nurse Practitioner

## 2021-04-15 ENCOUNTER — Encounter: Payer: Self-pay | Admitting: Nurse Practitioner

## 2021-04-15 LAB — LIPID PANEL W/O CHOL/HDL RATIO
Cholesterol, Total: 266 mg/dL — ABNORMAL HIGH (ref 100–199)
HDL: 82 mg/dL (ref >39)
LDL Chol Calc (NIH): 170 mg/dL — ABNORMAL HIGH (ref 0–99)
Triglycerides: 82 mg/dL (ref 0–149)
VLDL Cholesterol Cal: 14 mg/dL (ref 5–40)

## 2021-04-15 LAB — COMPREHENSIVE METABOLIC PANEL
ALT: 18 IU/L (ref 0–32)
AST: 29 IU/L (ref 0–40)
Albumin/Globulin Ratio: 2 (ref 1.2–2.2)
Albumin: 4.9 g/dL — ABNORMAL HIGH (ref 3.8–4.8)
Alkaline Phosphatase: 83 IU/L (ref 44–121)
BUN/Creatinine Ratio: 19 (ref 12–28)
BUN: 17 mg/dL (ref 8–27)
Bilirubin Total: 0.4 mg/dL (ref 0.0–1.2)
CO2: 27 mmol/L (ref 20–29)
Calcium: 9.6 mg/dL (ref 8.7–10.3)
Chloride: 99 mmol/L (ref 96–106)
Creatinine, Ser: 0.88 mg/dL (ref 0.57–1.00)
Globulin, Total: 2.5 g/dL (ref 1.5–4.5)
Glucose: 85 mg/dL (ref 70–99)
Potassium: 4.1 mmol/L (ref 3.5–5.2)
Sodium: 139 mmol/L (ref 134–144)
Total Protein: 7.4 g/dL (ref 6.0–8.5)
eGFR: 72 mL/min/{1.73_m2} (ref >59)

## 2021-04-15 LAB — TSH: TSH: 1.29 u[IU]/mL (ref 0.450–4.500)

## 2021-04-15 LAB — CBC WITH DIFFERENTIAL/PLATELET
Basophils Absolute: 0 10*3/uL (ref 0.0–0.2)
Basos: 1 %
EOS (ABSOLUTE): 0.1 10*3/uL (ref 0.0–0.4)
Eos: 2 %
Hematocrit: 39.7 % (ref 34.0–46.6)
Hemoglobin: 13.3 g/dL (ref 11.1–15.9)
Immature Grans (Abs): 0 10*3/uL (ref 0.0–0.1)
Immature Granulocytes: 0 %
Lymphocytes Absolute: 1.2 10*3/uL (ref 0.7–3.1)
Lymphs: 20 %
MCH: 27.4 pg (ref 26.6–33.0)
MCHC: 33.5 g/dL (ref 31.5–35.7)
MCV: 82 fL (ref 79–97)
Monocytes Absolute: 0.4 10*3/uL (ref 0.1–0.9)
Monocytes: 7 %
Neutrophils Absolute: 4.2 10*3/uL (ref 1.4–7.0)
Neutrophils: 70 %
Platelets: 238 10*3/uL (ref 150–450)
RBC: 4.86 x10E6/uL (ref 3.77–5.28)
RDW: 12.8 % (ref 11.7–15.4)
WBC: 6 10*3/uL (ref 3.4–10.8)

## 2021-04-15 LAB — ANA W/REFLEX IF POSITIVE: Anti Nuclear Antibody (ANA): NEGATIVE

## 2021-04-15 LAB — C-REACTIVE PROTEIN: CRP: <1 mg/L (ref 0–10)

## 2021-04-15 LAB — SEDIMENTATION RATE: Sed Rate: 8 mm/hr (ref 0–40)

## 2021-04-15 LAB — VITAMIN D 25 HYDROXY (VIT D DEFICIENCY, FRACTURES): Vit D, 25-Hydroxy: 35.8 ng/mL (ref 30.0–100.0)

## 2021-04-15 MED ORDER — ROSUVASTATIN CALCIUM 10 MG PO TABS: 10.0000 mg | ORAL_TABLET | Freq: Every day | ORAL | 4 refills | Status: DC

## 2021-04-15 NOTE — Progress Notes (Signed)
Contacted via [facility] The 10-year ASCVD risk score (Arnett DK, et al., 2019) is: 12.8%   Values used to calculate the score:     Age: 71 years     Sex: Female     Is Non-Hispanic African American: No     Diabetic: No     Tobacco smoker: No     Systolic Blood Pressure: 003 mmHg     Is BP treated: Yes     HDL Cholesterol: 82 mg/dL     Total Cholesterol: 266 mg/dL   Good afternoon [name], your labs have returned: - Kidney function, creatinine and eGFR, remains stable with normal levels.  Liver function, AST and ALT, is normal. - Cholesterol levels remain elevated -- at this point I would recommend starting Rosuvastatin to help lower these levels and prevent heart attack or stroke.  I will send in a low dose of this and if issues while taking it let me know. - CBC shows normal levels.  No infection or anemia. - Vitamin D level normal. - Thyroid and all inflammatory labs are normal.  How are you doing with Amlodipine?  Any questions? Keep being amazing!!  Thank you for allowing me to participate in your care.  I appreciate you. Kindest regards, Annalucia Laino

## 2021-04-21 ENCOUNTER — Other Ambulatory Visit: Payer: Self-pay | Admitting: Nurse Practitioner

## 2021-04-21 ENCOUNTER — Telehealth: Payer: Self-pay

## 2021-04-21 MED ORDER — ROSUVASTATIN CALCIUM 10 MG PO TABS: 10.0000 mg | ORAL_TABLET | Freq: Every day | ORAL | 4 refills | Status: DC

## 2021-04-21 NOTE — Telephone Encounter (Signed)
Pts insurance was updated 04/14/2021 to [place].

## 2021-04-21 NOTE — Telephone Encounter (Signed)
Patient called about a medication refill in another encounter and she mentioned using a mail order pharmacy, advised to contact insurance provider. She says she no longer has [facility] and it was supposed to be updated. I advised [employer] is the secondary, she says that's the only insurance she has. Advised I will send to the office to update.

## 2021-04-21 NOTE — Telephone Encounter (Signed)
Pt was advised that the RX was rosuvastatin (CRESTOR) 10 MG tablet/ could not be filled and they didn't have any / pt asked if this can be resent to [facility] In [place] on [address]/ Please advise assp

## 2021-04-21 NOTE — Telephone Encounter (Signed)
Requested Prescriptions  Pending Prescriptions Disp Refills   rosuvastatin (CRESTOR) 10 MG tablet 90 tablet 4    Sig: Take 1 tablet (10 mg total) by mouth daily.     Cardiovascular:  Antilipid - Statins 2 Failed - 04/21/2021  2:31 PM      Failed - Lipid Panel in normal range within the last 12 months    Cholesterol, Total  Date Value Ref Range Status  04/14/2021 266 (H) 100 - 199 mg/dL Final   LDL Chol Calc (NIH)  Date Value Ref Range Status  04/14/2021 170 (H) 0 - 99 mg/dL Final   HDL  Date Value Ref Range Status  04/14/2021 82 >39 mg/dL Final   Triglycerides  Date Value Ref Range Status  04/14/2021 82 0 - 149 mg/dL Final         Passed - Cr in normal range and within 360 days    Creatinine, Ser  Date Value Ref Range Status  04/14/2021 0.88 0.57 - 1.00 mg/dL Final         Passed - Patient is not pregnant      Passed - Valid encounter within last 12 months    Recent Outpatient Visits          1 week ago Stage 3a chronic kidney disease ([place])   [place], Jolene T, NP   7 months ago COVID-44   [employer], Avanti, MD   1 year ago Stage 3a chronic kidney disease ([place])   [place], Barbaraann Faster, NP   1 year ago Left foot pain   [place], [place] T, NP   1 year ago Encounter for annual physical exam   [place], Barbaraann Faster, NP      Future Appointments            In 3 weeks Cannady, Barbaraann Faster, NP [facility]   In 6 months  [facility]

## 2021-04-24 ENCOUNTER — Telehealth: Payer: Self-pay | Admitting: Student in an Organized Health Care Education/Training Program

## 2021-04-24 NOTE — Telephone Encounter (Signed)
Patient would like to have her next procedure before Feb 24. She hasnt been here since Nov 2022. Does she need to come in first or can we use the order in her chart ? Please check with Dr. Holley Raring. Then send to [place]. Patient wants to know what codes to give to her insurance so she can find out how much it will cost her.

## 2021-04-25 ENCOUNTER — Ambulatory Visit: Payer: Self-pay

## 2021-04-25 NOTE — Telephone Encounter (Signed)
Summary: poss toenail infected/very painful   Pt has been struggling w/ a toenail issue that has gotten worse over the past few days. 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  Pt not sure if it is infected.  Pt had used mupirocin cream in the past that helped. Pt not sure what to do.  Please advise.      Chief Complaint: Left Great toe nail painful, red Symptoms: Pain, red Frequency: Started Sunday Pertinent Negatives: Patient denies fever Disposition: [] ED /[] Urgent Care (no appt availability in office) / [x] Appointment(In office/virtual)/ []  [facility]/ [] Home Care/ [] Refused Recommended Disposition /[]  Mobile Bus/ []  Follow-up with PCP Additional Notes:   Reason for Disposition  [1] MODERATE pain (e.g., limping, interferes with normal activities) AND [2] present > 3 days  Answer Assessment - Initial Assessment Questions 1. LOCATION: "Which toe?"      Left Great toe 2. APPEARANCE: "What does it look like?"      Red, painful 3. ONSET: "When did it start?"      Sunday 4. PAIN: "Is there any pain?" If Yes, ask: "How bad is the pain?"   (Scale 1-10; or mild, moderate, severe)     Severe 5. REDNESS: "Is there any redness of the skin?" If Yes, ask: "How much of the toe is red?"     Yes 6. OTHER SYMPTOMS: "Do you have any other symptoms?" (e.g., fever, shaking, chills, red streak up foot)     No 7. PREGNANCY: "Is there any chance you are pregnant?" "When was your last menstrual period?"     No  Protocols used: Toenail - Ingrown-A-AH

## 2021-04-28 ENCOUNTER — Encounter: Payer: Self-pay | Admitting: Internal Medicine

## 2021-04-28 ENCOUNTER — Ambulatory Visit (INDEPENDENT_AMBULATORY_CARE_PROVIDER_SITE_OTHER): Payer: Medicare Other | Admitting: Internal Medicine

## 2021-04-28 ENCOUNTER — Other Ambulatory Visit: Payer: Self-pay

## 2021-04-28 VITALS — BP 140/70 | HR 80 | Temp 97.8°F | Ht 62.01 in | Wt 104.8 lb

## 2021-04-28 DIAGNOSIS — L03039 Cellulitis of unspecified toe: Secondary | ICD-10-CM | POA: Diagnosis not present

## 2021-04-28 MED ORDER — CEPHALEXIN 500 MG PO CAPS: 500.0000 mg | ORAL_CAPSULE | Freq: Two times a day (BID) | ORAL | 0 refills | Status: AC

## 2021-04-28 MED ORDER — MUPIROCIN CALCIUM 2 % EX CREA: 1.0000 "application " | TOPICAL_CREAM | Freq: Two times a day (BID) | CUTANEOUS | 0 refills | Status: DC

## 2021-04-28 NOTE — Patient Instructions (Signed)
Paronychia Paronychia is an infection of the skin that surrounds a nail. It usually affects the skin around a fingernail, but it may also occur near a toenail. It often causes pain and swelling around the nail. In some cases, a collection of pus (abscess) can form near or under the nail.  This condition may develop suddenly, or it may develop gradually over a longer period. In most cases, paronychia is not serious, and it will clear up with treatment. What are the causes? This condition may be caused by bacteria or a fungus, such as yeast. The bacteria or fungus can enter the body through an opening in the skin, such as a cut or a hangnail, and cause an infection in your fingernail or toenail. Other causes may include: Recurrent injury to the fingernail or toenail area. Irritation of the base and sides of the nail (cuticle). Injury and irritation can result in inflammation, swelling, and thickened skin around the nail. What increases the risk? This condition is more likely to develop in people who: Get their hands wet often, such as those who work as Designer, industrial/product, bartenders, or housekeepers. Bite their fingernails or cuticles. Have underlying skin conditions. Have hangnails or injured fingertips. Are exposed to irritants like detergents and other chemicals. Have diabetes. What are the signs or symptoms? Symptoms of this condition include: Redness and swelling of the skin near the nail. Tenderness around the nail when you touch the area. Pus-filled bumps under the cuticle. Fluid or pus under the nail. Throbbing pain in the area. How is this diagnosed? This condition is diagnosed with a physical exam. In some cases, a sample of pus may be tested to determine what type of bacteria or fungus is causing the condition. How is this treated? Treatment depends on the cause and severity of your condition. If your condition is mild, it may clear up on its own in a few days or after soaking in warm  water. If needed, treatment may include: Antibiotic medicine, if your infection is caused by bacteria. Antifungal medicine, if your infection is caused by a fungus. A procedure to drain pus from an abscess. Anti-inflammatory medicine (corticosteroids). Removal of part of an ingrown toenail. A bandage (dressing) may be placed over the affected area if an abscess or part of a nail has been removed. Follow these instructions at home: Wound care Keep the affected area clean. Soak the affected area in warm water if told to do so by your health care provider. You may be told to do this for 20 minutes, 2-3 times a day. Keep the area dry when you are not soaking it. Do not try to drain an abscess yourself. Follow instructions from your health care provider about how to take care of the affected area. Make sure you: Wash your hands with soap and water for at least 20 seconds before and after you change your dressing. If soap and water are not available, use hand sanitizer. Change your dressing as told by your health care provider. If you had an abscess drained, check the area every day for signs of infection. Check for: Redness, swelling, or pain. Fluid or blood. Warmth. Pus or a bad smell. Medicines  Take over-the-counter and prescription medicines only as told by your health care provider. If you were prescribed an antibiotic medicine, take it as told by your health care provider. Do not stop taking the antibiotic even if you start to feel better. General instructions Avoid contact with any skin irritants or allergens.  Do not pick at the affected area. Keep all follow-up visits as told. This is important. Prevention To prevent this condition from happening again: Wear rubber gloves when washing dishes or doing other tasks that require your hands to get wet. Wear gloves if your hands might come in contact with cleaners or other chemicals. Avoid injuring your nails or fingertips. Do not bite  your nails or tear hangnails. Do not cut your nails very short. Do not cut your cuticles. Use clean nail clippers or scissors when trimming nails. Contact a health care provider if: Your symptoms get worse or do not improve with treatment. You have continued or increased fluid, blood, or pus coming from the affected area. Your affected finger, toe, or joint becomes swollen or difficult to move. You have a fever or chills. There is redness spreading away from the affected area. Summary Paronychia is an infection of the skin that surrounds a nail. It often causes pain and swelling around the nail. In some cases, a collection of pus (abscess) can form near or under the nail. This condition may be caused by bacteria or a fungus. These germs can enter the body through an opening in the skin, such as a cut or a hangnail. If your condition is mild, it may clear up on its own in a few days. If needed, treatment may include medicine or a procedure to drain pus from an abscess. To prevent this condition from happening again, wear gloves if doing tasks that require your hands to get wet or to come in contact with chemicals. Also avoid injuring your nails or fingertips. This information is not intended to replace advice given to you by your health care provider. Make sure you discuss any questions you have with your health care provider. Document Revised: 06/03/2020 Document Reviewed: 06/03/2020 Elsevier Patient Education  [place].

## 2021-04-28 NOTE — Progress Notes (Signed)
BP 140/70    Pulse 80    Temp 97.8 F (36.6 C) (Oral)    Ht 5' 2.01" (1.575 m)    Wt 104 lb 12.8 oz (47.5 kg)    SpO2 100%    BMI 19.16 kg/m    Subjective:    Patient ID: Danielle Stephens, female    DOB: 04-22-1950, 71 y.o.   MRN: [id number]  Chief Complaint  Patient presents with   Toe Pain    Left great toe pain for past 2 months, increased pain in last month.     HPI: Danielle Stephens is a 71 y.o. female  Toe Pain  The incident occurred more than 1 week ago. The pain is present in the left foot. The pain is at a severity of 6/10. The pain is moderate. The pain has been Fluctuating since onset. Pertinent negatives include no inability to bear weight, loss of motion, loss of sensation, muscle weakness, numbness or tingling. The treatment provided mild relief.   Chief Complaint  Patient presents with   Toe Pain    Left great toe pain for past 2 months, increased pain in last month.     Relevant past medical, surgical, family and social history reviewed and updated as indicated. Interim medical history since our last visit reviewed. Allergies and medications reviewed and updated.  Review of Systems  Neurological:  Negative for tingling and numbness.   Per HPI unless specifically indicated above     Objective:    BP 140/70    Pulse 80    Temp 97.8 F (36.6 C) (Oral)    Ht 5' 2.01" (1.575 m)    Wt 104 lb 12.8 oz (47.5 kg)    SpO2 100%    BMI 19.16 kg/m   Wt Readings from Last 3 Encounters:  05/06/21 106 lb (48.1 kg)  04/28/21 104 lb 12.8 oz (47.5 kg)  04/14/21 104 lb 12.8 oz (47.5 kg)    Physical Exam Constitutional:      Appearance: Normal appearance.  Musculoskeletal:        General: Tenderness present. No swelling.     Comments: Left toe pain with swelling and erythema noted  Skin:    Coloration: Skin is not jaundiced or pale.     Findings: Erythema present.  Neurological:     Mental Status: She is alert.    Results for orders placed or performed in visit on 04/14/21   Microalbumin, Urine Waived  Result Value Ref Range   Microalb, Ur Waived 10 0 - 19 mg/L   Creatinine, Urine Waived 10 10 - 300 mg/dL   Microalb/Creat Ratio <30 <30 mg/g  Comprehensive metabolic panel  Result Value Ref Range   Glucose 85 70 - 99 mg/dL   BUN 17 8 - 27 mg/dL   Creatinine, Ser 0.88 0.57 - 1.00 mg/dL   eGFR 72 >59 mL/min/1.73   BUN/Creatinine Ratio 19 12 - 28   Sodium 139 134 - 144 mmol/L   Potassium 4.1 3.5 - 5.2 mmol/L   Chloride 99 96 - 106 mmol/L   CO2 27 20 - 29 mmol/L   Calcium 9.6 8.7 - 10.3 mg/dL   Total Protein 7.4 6.0 - 8.5 g/dL   Albumin 4.9 (H) 3.8 - 4.8 g/dL   Globulin, Total 2.5 1.5 - 4.5 g/dL   Albumin/Globulin Ratio 2.0 1.2 - 2.2   Bilirubin Total 0.4 0.0 - 1.2 mg/dL   Alkaline Phosphatase 83 44 - 121 IU/L   AST  29 0 - 40 IU/L   ALT 18 0 - 32 IU/L  CBC with Differential/Platelet  Result Value Ref Range   WBC 6.0 3.4 - 10.8 x10E3/uL   RBC 4.86 3.77 - 5.28 x10E6/uL   Hemoglobin 13.3 11.1 - 15.9 g/dL   Hematocrit 39.7 34.0 - 46.6 %   MCV 82 79 - 97 fL   MCH 27.4 26.6 - 33.0 pg   MCHC 33.5 31.5 - 35.7 g/dL   RDW 12.8 11.7 - 15.4 %   Platelets 238 150 - 450 x10E3/uL   Neutrophils 70 Not Estab. %   Lymphs 20 Not Estab. %   Monocytes 7 Not Estab. %   Eos 2 Not Estab. %   Basos 1 Not Estab. %   Neutrophils Absolute 4.2 1.4 - 7.0 x10E3/uL   Lymphocytes Absolute 1.2 0.7 - 3.1 x10E3/uL   Monocytes Absolute 0.4 0.1 - 0.9 x10E3/uL   EOS (ABSOLUTE) 0.1 0.0 - 0.4 x10E3/uL   Basophils Absolute 0.0 0.0 - 0.2 x10E3/uL   Immature Granulocytes 0 Not Estab. %   Immature Grans (Abs) 0.0 0.0 - 0.1 x10E3/uL  Lipid Panel w/o Chol/HDL Ratio  Result Value Ref Range   Cholesterol, Total 266 (H) 100 - 199 mg/dL   Triglycerides 82 0 - 149 mg/dL   HDL 82 >39 mg/dL   VLDL Cholesterol Cal 14 5 - 40 mg/dL   LDL Chol Calc (NIH) 170 (H) 0 - 99 mg/dL  VITAMIN D 25 Hydroxy (Vit-D Deficiency, Fractures)  Result Value Ref Range   Vit D, 25-Hydroxy 35.8 30.0 - 100.0  ng/mL  TSH  Result Value Ref Range   TSH 1.290 0.450 - 4.500 uIU/mL  ANA w/Reflex if Positive  Result Value Ref Range   Anti Nuclear Antibody (ANA) Negative Negative  C-reactive protein  Result Value Ref Range   CRP <1 0 - 10 mg/L  Sed Rate (ESR)  Result Value Ref Range   Sed Rate 8 0 - 40 mm/hr        Current Outpatient Medications:    acetaminophen (TYLENOL) 500 MG tablet, Take 2 tablets (1,000 mg total) by mouth every 8 (eight) hours., Disp: 90 tablet, Rfl: 2   amLODipine (NORVASC) 2.5 MG tablet, Take 1 tablet (2.5 mg total) by mouth daily., Disp: 90 tablet, Rfl: 4   Cholecalciferol (VITAMIN D) 50 MCG (2000 UT) CAPS, Take 2,000 Units by mouth daily. gummie, Disp: , Rfl:    lisinopril (ZESTRIL) 20 MG tablet, Take 1 tablet (20 mg total) by mouth daily. Courtesy refill only, need visit for further refills., Disp: 90 tablet, Rfl: 0   Multiple Vitamin (MULTIVITAMIN) capsule, Take 1 capsule by mouth daily., Disp: , Rfl:    mupirocin cream (BACTROBAN) 2 %, Apply 1 application topically 2 (two) times daily., Disp: 15 g, Rfl: 0   rosuvastatin (CRESTOR) 10 MG tablet, Take 1 tablet (10 mg total) by mouth daily., Disp: 90 tablet, Rfl: 4   cephALEXin (KEFLEX) 500 MG capsule, Take 1 capsule (500 mg total) by mouth 3 (three) times daily. As needed if toe infection develops, Disp: 30 capsule, Rfl: 0    Assessment & Plan:   Left great toe pain ? Sec to paronychia of the left toe will start pt on mupirocin keflex for such    Problem List Items Addressed This Visit       Musculoskeletal and Integument   RESOLVED: Paronychia of great toe - Primary   Relevant Medications   mupirocin cream (BACTROBAN) 2 %  No orders of the defined types were placed in this encounter.    Meds ordered this encounter  Medications   cephALEXin (KEFLEX) 500 MG capsule    Sig: Take 1 capsule (500 mg total) by mouth 2 (two) times daily for 7 days.    Dispense:  14 capsule    Refill:  0   mupirocin cream  (BACTROBAN) 2 %    Sig: Apply 1 application topically 2 (two) times daily.    Dispense:  15 g    Refill:  0     Follow up plan: Return in about 1 week (around 05/05/2021).

## 2021-05-01 NOTE — Telephone Encounter (Signed)
I have tried calling patient numerous times, have left numerous voicemails, this time and the past procedure that was ordered and I never get a return call.

## 2021-05-04 DIAGNOSIS — Z6720 Type B blood, Rh positive: Secondary | ICD-10-CM | POA: Insufficient documentation

## 2021-05-04 NOTE — Patient Instructions (Signed)
Healthy Eating °Following a healthy eating pattern may help you to achieve and maintain a healthy body weight, reduce the risk of chronic disease, and live a long and productive life. It is important to follow a healthy eating pattern at an appropriate calorie level for your body. Your nutritional needs should be met primarily through food by choosing a variety of nutrient-rich foods. °What are tips for following this plan? °Reading food labels °Read labels and choose the following: °Reduced or low sodium. °Juices with 100% fruit juice. °Foods with low saturated fats and high polyunsaturated and monounsaturated fats. °Foods with whole grains, such as whole wheat, cracked wheat, brown rice, and wild rice. °Whole grains that are fortified with folic acid. This is recommended for women who are pregnant or who want to become pregnant. °Read labels and avoid the following: °Foods with a lot of added sugars. These include foods that contain brown sugar, corn sweetener, corn syrup, dextrose, fructose, glucose, high-fructose corn syrup, honey, invert sugar, lactose, malt syrup, maltose, molasses, raw sugar, sucrose, trehalose, or turbinado sugar. °Do not eat more than the following amounts of added sugar per day: °6 teaspoons (25 g) for women. °9 teaspoons (38 g) for men. °Foods that contain processed or refined starches and grains. °Refined grain products, such as white flour, degermed cornmeal, white bread, and white rice. °Shopping °Choose nutrient-rich snacks, such as vegetables, whole fruits, and nuts. Avoid high-calorie and high-sugar snacks, such as potato chips, fruit snacks, and candy. °Use oil-based dressings and spreads on foods instead of solid fats such as butter, stick margarine, or cream cheese. °Limit pre-made sauces, mixes, and "instant" products such as flavored rice, instant noodles, and ready-made pasta. °Try more plant-protein sources, such as tofu, tempeh, black beans, edamame, lentils, nuts, and  seeds. °Explore eating plans such as the Mediterranean diet or vegetarian diet. °Cooking °Use oil to sauté or stir-fry foods instead of solid fats such as butter, stick margarine, or lard. °Try baking, boiling, grilling, or broiling instead of frying. °Remove the fatty part of meats before cooking. °Steam vegetables in water or broth. °Meal planning ° °At meals, imagine dividing your plate into fourths: °One-half of your plate is fruits and vegetables. °One-fourth of your plate is whole grains. °One-fourth of your plate is protein, especially lean meats, poultry, eggs, tofu, beans, or nuts. °Include low-fat dairy as part of your daily diet. °Lifestyle °Choose healthy options in all settings, including home, work, school, restaurants, or stores. °Prepare your food safely: °Wash your hands after handling raw meats. °Keep food preparation surfaces clean by regularly washing with hot, soapy water. °Keep raw meats separate from ready-to-eat foods, such as fruits and vegetables. °Cook seafood, meat, poultry, and eggs to the recommended internal temperature. °Store foods at safe temperatures. In general: °Keep cold foods at 40°F (4.4°C) or below. °Keep hot foods at 140°F (60°C) or above. °Keep your freezer at 0°F (-17.8°C) or below. °Foods are no longer safe to eat when they have been between the temperatures of 40°-140°F (4.4-60°C) for more than 2 hours. °What foods should I eat? °Fruits °Aim to eat 2 cup-equivalents of fresh, canned (in natural juice), or frozen fruits each day. Examples of 1 cup-equivalent of fruit include 1 small apple, 8 large strawberries, 1 cup canned fruit, ½ cup dried fruit, or 1 cup 100% juice. °Vegetables °Aim to eat 2½-3 cup-equivalents of fresh and frozen vegetables each day, including different varieties and colors. Examples of 1 cup-equivalent of vegetables include 2 medium carrots, 2 cups raw,   leafy greens, 1 cup chopped vegetable (raw or cooked), or 1 medium baked potato. Grains Aim to  eat 6 ounce-equivalents of whole grains each day. Examples of 1 ounce-equivalent of grains include 1 slice of bread, 1 cup ready-to-eat cereal, 3 cups popcorn, or  cup cooked rice, pasta, or cereal. Meats and other proteins Aim to eat 5-6 ounce-equivalents of protein each day. Examples of 1 ounce-equivalent of protein include 1 egg, 1/2 cup nuts or seeds, or 1 tablespoon (16 g) peanut butter. A cut of meat or fish that is the size of a deck of cards is about 3-4 ounce-equivalents. Of the protein you eat each week, try to have at least 8 ounces come from seafood. This includes salmon, trout, herring, and anchovies. Dairy Aim to eat 3 cup-equivalents of fat-free or low-fat dairy each day. Examples of 1 cup-equivalent of dairy include 1 cup (240 mL) milk, 8 ounces (250 g) yogurt, 1 ounces (44 g) natural cheese, or 1 cup (240 mL) fortified soy milk. Fats and oils Aim for about 5 teaspoons (21 g) per day. Choose monounsaturated fats, such as canola and olive oils, avocados, peanut butter, and most nuts, or polyunsaturated fats, such as sunflower, corn, and soybean oils, walnuts, pine nuts, sesame seeds, sunflower seeds, and flaxseed. Beverages Aim for six 8-oz glasses of water per day. Limit coffee to three to five 8-oz cups per day. Limit caffeinated beverages that have added calories, such as soda and energy drinks. Limit alcohol intake to no more than 1 drink a day for nonpregnant women and 2 drinks a day for men. One drink equals 12 oz of beer (355 mL), 5 oz of wine (148 mL), or 1 oz of hard liquor (44 mL). Seasoning and other foods Avoid adding excess amounts of salt to your foods. Try flavoring foods with herbs and spices instead of salt. Avoid adding sugar to foods. Try using oil-based dressings, sauces, and spreads instead of solid fats. This information is based on general [place] nutrition guidelines. For more information, visit [employer]. Exact amounts may vary based on your nutrition  needs. Summary A healthy eating plan may help you to maintain a healthy weight, reduce the risk of chronic diseases, and stay active throughout your life. Plan your meals. Make sure you eat the right portions of a variety of nutrient-rich foods. Try baking, boiling, grilling, or broiling instead of frying. Choose healthy options in all settings, including home, work, school, restaurants, or stores. This information is not intended to replace advice given to you by your health care provider. Make sure you discuss any questions you have with your health care provider. Document Revised: 10/29/2020 Document Reviewed: 10/29/2020 Elsevier Patient Education  [place].

## 2021-05-06 ENCOUNTER — Ambulatory Visit (INDEPENDENT_AMBULATORY_CARE_PROVIDER_SITE_OTHER): Payer: Medicare Other | Admitting: Nurse Practitioner

## 2021-05-06 ENCOUNTER — Other Ambulatory Visit: Payer: Self-pay

## 2021-05-06 ENCOUNTER — Encounter: Payer: Self-pay | Admitting: Nurse Practitioner

## 2021-05-06 VITALS — BP 122/72 | HR 80 | Temp 98.1°F | Ht 62.01 in | Wt 106.0 lb

## 2021-05-06 DIAGNOSIS — M79675 Pain in left toe(s): Secondary | ICD-10-CM | POA: Insufficient documentation

## 2021-05-06 DIAGNOSIS — I10 Essential (primary) hypertension: Secondary | ICD-10-CM

## 2021-05-06 DIAGNOSIS — N1831 Chronic kidney disease, stage 3a: Secondary | ICD-10-CM

## 2021-05-06 DIAGNOSIS — I7300 Raynaud's syndrome without gangrene: Secondary | ICD-10-CM | POA: Diagnosis not present

## 2021-05-06 NOTE — Assessment & Plan Note (Signed)
Chronic, stable.  BP at goal today and on occasional home readings.  Continue current medication regimen and adjust as needed.  Will send refills as needed.  Recommend focus on DASH diet at home and continue to monitor BP regularly.

## 2021-05-06 NOTE — Assessment & Plan Note (Signed)
Improved last check and on Lisinopril.  Labs up to date.

## 2021-05-06 NOTE — Assessment & Plan Note (Signed)
Ongoing, continue Amlodipine at this time -- is tolerating and BP improved. 

## 2021-05-06 NOTE — Progress Notes (Signed)
BP 122/72    Pulse 80    Temp 98.1 F (36.7 C) (Oral)    Ht 5' 2.01" (1.575 m)    Wt 106 lb (48.1 kg)    SpO2 98%    BMI 19.38 kg/m    Subjective:    Patient ID: Danielle Stephens, female    DOB: 09-12-1950, 72 y.o.   MRN: [id number]  HPI: Danielle Stephens is a 71 y.o. female  Chief Complaint  Patient presents with   Toe Pain    Patient states her left toe is still bothering her and she has one more day of the antibiotic and cream. Patient states she is still having some discomfort and it is starting to disturb her sleep pattern.    Hypertension    Patient states her blood pressure readings have been up and down lately when she checks it at home.    Chronic Kidney Disease   HYPERTENSION Continues on Lisinopril 20 MG daily + Amlodipine 2.5 MG.  Placed on Amlodipine last visit due to Raynaud's.   Hypertension status: stable  Satisfied with current treatment? yes Duration of hypertension: chronic BP monitoring frequency:  rarely BP range:  varies at time at home BP medication side effects:  no Medication compliance: good compliance Previous BP meds: Lisinopril Aspirin: no Recurrent headaches: no Visual changes: no Palpitations: no Dyspnea: occasional with anxiety Chest pain: occasional with anxiety Lower extremity edema: none Dizzy/lightheaded: no  The 10-year ASCVD risk score (Arnett DK, et al., 2019) is: 9.3%   Values used to calculate the score:     Age: 64 years     Sex: Female     Is Non-Hispanic African American: No     Diabetic: No     Tobacco smoker: No     Systolic Blood Pressure: 353 mmHg     Is BP treated: Yes     HDL Cholesterol: 82 mg/dL     Total Cholesterol: 266 mg/dL  CHRONIC KIDNEY DISEASE Recent labs stable. CKD status: stable Medications renally dose: yes Previous renal evaluation: no Pneumovax:  Up to Date Influenza Vaccine:  Up to Date   TOE PAIN Saw Dr. Neomia Dear on 04/28/21 for left great toe pain, was given Keflex and ointment to place on area, which has  not improved this.  Pain started around 04/15/21. Duration: weeks Involved foot: left Mechanism of injury: unknown Location: left great toe Onset: gradual  Severity: 7/10  Quality:  sharp, dull, aching, and throbbing Frequency: intermittent Radiation: no Aggravating factors: weight bearing and movement  Alleviating factors: APAP and rest  Status: fluctuating Treatments attempted: rest and APAP  Relief with NSAIDs?:  No NSAIDs Taken Weakness with weight bearing or walking: no Morning stiffness: no Swelling: no Redness: yes Bruising: no Paresthesias / decreased sensation: no  Fevers:no   Relevant past medical, surgical, family and social history reviewed and updated as indicated. Interim medical history since our last visit reviewed. Allergies and medications reviewed and updated.  Review of Systems  Constitutional:  Negative for activity change, appetite change, diaphoresis, fatigue and fever.  Respiratory:  Negative for cough, chest tightness and shortness of breath.   Cardiovascular:  Negative for chest pain, palpitations and leg swelling.  Gastrointestinal: Negative.   Musculoskeletal:  Positive for arthralgias.  Neurological: Negative.   Psychiatric/Behavioral: Negative.     Per HPI unless specifically indicated above     Objective:    BP 122/72    Pulse 80    Temp 98.1 F (  36.7 C) (Oral)    Ht 5' 2.01" (1.575 m)    Wt 106 lb (48.1 kg)    SpO2 98%    BMI 19.38 kg/m   Wt Readings from Last 3 Encounters:  05/06/21 106 lb (48.1 kg)  04/28/21 104 lb 12.8 oz (47.5 kg)  04/14/21 104 lb 12.8 oz (47.5 kg)    Physical Exam Vitals and nursing note reviewed.  Constitutional:      General: She is awake. She is not in acute distress.    Appearance: She is well-developed and well-groomed. She is not ill-appearing.  HENT:     Head: Normocephalic.     Right Ear: Hearing normal.     Left Ear: Hearing normal.  Eyes:     General: Lids are normal.        Right eye: No  discharge.        Left eye: No discharge.     Conjunctiva/sclera: Conjunctivae normal.     Pupils: Pupils are equal, round, and reactive to light.  Neck:     Thyroid: No thyromegaly.     Vascular: No carotid bruit.     Comments: No bruits auscultated. Cardiovascular:     Rate and Rhythm: Normal rate and regular rhythm.     Pulses:          Dorsalis pedis pulses are 2+ on the right side and 2+ on the left side.       Posterior tibial pulses are 2+ on the right side and 2+ on the left side.     Heart sounds: Normal heart sounds. No murmur heard.   No gallop.  Pulmonary:     Effort: Pulmonary effort is normal.     Breath sounds: Normal breath sounds.  Abdominal:     General: Bowel sounds are normal.     Palpations: Abdomen is soft.  Musculoskeletal:     Right shoulder: No swelling, laceration, tenderness, bony tenderness or crepitus. Decreased range of motion. Normal strength.     Left shoulder: Normal.     Right hand: Normal.     Left hand: Normal.     Cervical back: Normal range of motion and neck supple.     Right lower leg: No edema.     Left lower leg: No edema.  Feet:     Right foot:     Protective Sensation: 10 sites tested.  10 sites sensed.     Skin integrity: No erythema.     Toenail Condition: Right toenails are normal.     Left foot:     Protective Sensation: 10 sites tested.  10 sites sensed.     Skin integrity: No erythema.     Toenail Condition: Left toenails are ingrown.     Comments: Appears left great toenail ingrowing into medial aspect of toe.   Skin:    General: Skin is warm and dry.  Neurological:     Mental Status: She is alert and oriented to person, place, and time.  Psychiatric:        Attention and Perception: Attention normal.        Mood and Affect: Mood normal.        Speech: Speech normal.        Behavior: Behavior normal. Behavior is cooperative.        Thought Content: Thought content normal.   EKG My review and personal interpretation  at Time: 1600  Indication: irregular HR Rate: 80  Rhythm: sinus Axis: normal Other:  no nonspecific st abn, no stemi, no lvh -- frequent PAC noted.  Results for orders placed or performed in visit on 04/14/21  Microalbumin, Urine Waived  Result Value Ref Range   Microalb, Ur Waived 10 0 - 19 mg/L   Creatinine, Urine Waived 10 10 - 300 mg/dL   Microalb/Creat Ratio <30 <30 mg/g  Comprehensive metabolic panel  Result Value Ref Range   Glucose 85 70 - 99 mg/dL   BUN 17 8 - 27 mg/dL   Creatinine, Ser 0.88 0.57 - 1.00 mg/dL   eGFR 72 >59 mL/min/1.73   BUN/Creatinine Ratio 19 12 - 28   Sodium 139 134 - 144 mmol/L   Potassium 4.1 3.5 - 5.2 mmol/L   Chloride 99 96 - 106 mmol/L   CO2 27 20 - 29 mmol/L   Calcium 9.6 8.7 - 10.3 mg/dL   Total Protein 7.4 6.0 - 8.5 g/dL   Albumin 4.9 (H) 3.8 - 4.8 g/dL   Globulin, Total 2.5 1.5 - 4.5 g/dL   Albumin/Globulin Ratio 2.0 1.2 - 2.2   Bilirubin Total 0.4 0.0 - 1.2 mg/dL   Alkaline Phosphatase 83 44 - 121 IU/L   AST 29 0 - 40 IU/L   ALT 18 0 - 32 IU/L  CBC with Differential/Platelet  Result Value Ref Range   WBC 6.0 3.4 - 10.8 x10E3/uL   RBC 4.86 3.77 - 5.28 x10E6/uL   Hemoglobin 13.3 11.1 - 15.9 g/dL   Hematocrit 39.7 34.0 - 46.6 %   MCV 82 79 - 97 fL   MCH 27.4 26.6 - 33.0 pg   MCHC 33.5 31.5 - 35.7 g/dL   RDW 12.8 11.7 - 15.4 %   Platelets 238 150 - 450 x10E3/uL   Neutrophils 70 Not Estab. %   Lymphs 20 Not Estab. %   Monocytes 7 Not Estab. %   Eos 2 Not Estab. %   Basos 1 Not Estab. %   Neutrophils Absolute 4.2 1.4 - 7.0 x10E3/uL   Lymphocytes Absolute 1.2 0.7 - 3.1 x10E3/uL   Monocytes Absolute 0.4 0.1 - 0.9 x10E3/uL   EOS (ABSOLUTE) 0.1 0.0 - 0.4 x10E3/uL   Basophils Absolute 0.0 0.0 - 0.2 x10E3/uL   Immature Granulocytes 0 Not Estab. %   Immature Grans (Abs) 0.0 0.0 - 0.1 x10E3/uL  Lipid Panel w/o Chol/HDL Ratio  Result Value Ref Range   Cholesterol, Total 266 (H) 100 - 199 mg/dL   Triglycerides 82 0 - 149 mg/dL   HDL 82 >39  mg/dL   VLDL Cholesterol Cal 14 5 - 40 mg/dL   LDL Chol Calc (NIH) 170 (H) 0 - 99 mg/dL  VITAMIN D 25 Hydroxy (Vit-D Deficiency, Fractures)  Result Value Ref Range   Vit D, 25-Hydroxy 35.8 30.0 - 100.0 ng/mL  TSH  Result Value Ref Range   TSH 1.290 0.450 - 4.500 uIU/mL  ANA w/Reflex if Positive  Result Value Ref Range   Anti Nuclear Antibody (ANA) Negative Negative  C-reactive protein  Result Value Ref Range   CRP <1 0 - 10 mg/L  Sed Rate (ESR)  Result Value Ref Range   Sed Rate 8 0 - 40 mm/hr      Assessment & Plan:   Problem List Items Addressed This Visit       Cardiovascular and Mediastinum   Benign essential HTN    Chronic, stable.  BP at goal today and on occasional home readings.  Continue current medication regimen and adjust as needed.  Will send  refills as needed.  Recommend focus on DASH diet at home and continue to monitor BP regularly.        Raynaud's disease without gangrene - Primary    Ongoing, continue Amlodipine at this time -- is tolerating and BP improved.      Relevant Orders   CBC with Differential/Platelet   Iron Binding Cap (TIBC)([facility])   Ferritin     Genitourinary   CKD (chronic kidney disease) stage 3, GFR 30-59 ml/min (HCC)    Improved last check and on Lisinopril.  Labs up to date.        Other   Great toe pain, left    Referral to podiatry and will check uric acid level today, although suspect more ingrown toenail presenting.      Relevant Orders   Ambulatory referral to Podiatry   Uric acid     Follow up plan: Return in about 8 weeks (around 06/30/2021) for HTN/HLD, RAYNAUDS.

## 2021-05-06 NOTE — Assessment & Plan Note (Signed)
Referral to podiatry and will check uric acid level today, although suspect more ingrown toenail presenting.

## 2021-05-07 ENCOUNTER — Ambulatory Visit (INDEPENDENT_AMBULATORY_CARE_PROVIDER_SITE_OTHER): Payer: Medicare Other

## 2021-05-07 ENCOUNTER — Ambulatory Visit (INDEPENDENT_AMBULATORY_CARE_PROVIDER_SITE_OTHER): Payer: Medicare Other | Admitting: Podiatry

## 2021-05-07 DIAGNOSIS — L600 Ingrowing nail: Secondary | ICD-10-CM

## 2021-05-07 DIAGNOSIS — M2042 Other hammer toe(s) (acquired), left foot: Secondary | ICD-10-CM | POA: Diagnosis not present

## 2021-05-07 DIAGNOSIS — M21622 Bunionette of left foot: Secondary | ICD-10-CM

## 2021-05-07 DIAGNOSIS — M2041 Other hammer toe(s) (acquired), right foot: Secondary | ICD-10-CM

## 2021-05-07 DIAGNOSIS — M21621 Bunionette of right foot: Secondary | ICD-10-CM | POA: Diagnosis not present

## 2021-05-07 LAB — IRON AND TIBC
Iron Saturation: 23 % (ref 15–55)
Iron: 73 ug/dL (ref 27–139)
Total Iron Binding Capacity: 316 ug/dL (ref 250–450)
UIBC: 243 ug/dL (ref 118–369)

## 2021-05-07 LAB — CBC WITH DIFFERENTIAL/PLATELET
Basophils Absolute: 0 10*3/uL (ref 0.0–0.2)
Basos: 1 %
EOS (ABSOLUTE): 0.2 10*3/uL (ref 0.0–0.4)
Eos: 3 %
Hematocrit: 38.4 % (ref 34.0–46.6)
Hemoglobin: 12.4 g/dL (ref 11.1–15.9)
Immature Grans (Abs): 0 10*3/uL (ref 0.0–0.1)
Immature Granulocytes: 0 %
Lymphocytes Absolute: 1.1 10*3/uL (ref 0.7–3.1)
Lymphs: 21 %
MCH: 27.1 pg (ref 26.6–33.0)
MCHC: 32.3 g/dL (ref 31.5–35.7)
MCV: 84 fL (ref 79–97)
Monocytes Absolute: 0.5 10*3/uL (ref 0.1–0.9)
Monocytes: 11 %
Neutrophils Absolute: 3.3 10*3/uL (ref 1.4–7.0)
Neutrophils: 64 %
Platelets: 221 10*3/uL (ref 150–450)
RBC: 4.58 x10E6/uL (ref 3.77–5.28)
RDW: 12.8 % (ref 11.7–15.4)
WBC: 5.1 10*3/uL (ref 3.4–10.8)

## 2021-05-07 LAB — FERRITIN: Ferritin: 56 ng/mL (ref 15–150)

## 2021-05-07 LAB — URIC ACID: Uric Acid: 4.6 mg/dL (ref 3.0–7.2)

## 2021-05-07 MED ORDER — CEPHALEXIN 500 MG PO CAPS: 500.0000 mg | ORAL_CAPSULE | Freq: Three times a day (TID) | ORAL | 0 refills | Status: DC

## 2021-05-07 NOTE — Patient Instructions (Signed)

## 2021-05-07 NOTE — Progress Notes (Signed)
Contacted via [place] morning [name], your labs have returned and are overall normal -- including uric acid level.  No concerns on these, ensure to keep visit with podiatry once scheduled for further assessment of the toe.  Any questions? Keep being stellar!!  Thank you for allowing me to participate in your care.  I appreciate you. Kindest regards, Charlane Westry

## 2021-05-07 NOTE — Progress Notes (Signed)
Subjective:  Patient ID: Danielle Stephens, female    DOB: May 12, 1950,  MRN: [id number]  Chief Complaint  Patient presents with   Ingrown Toenail    71 y.o. female presents with the above complaint. History confirmed with patient.  In addition to the ingrowing nail she also complains of spots on the outside of the bilateral foot that causes pressure when in shoes.  This turns the toe in and causes pressure in between the toes.  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  Radiographs: Multiple views x-ray of both feet: Adductovarus contracture of the fifth toe bilateral with tailor's bunion Assessment:   1. Ingrowing left great toenail   2. Hammertoe of right foot   3. Hammertoe of left foot   4. Tailor's bunion of both feet      Plan:  Patient was evaluated and treated and all questions answered.    Ingrown Nail, left -Patient elects to proceed with minor surgery to remove ingrown toenail today. Consent reviewed and signed by patient. -Ingrown nail excised. See procedure note. -Educated on post-procedure care including soaking. Written instructions provided and reviewed. -Currently there is no signs symptoms of infection, I did prescribe her Keflex to take with her to [place] as a precaution in the event she does develop any sort of infection signs which I discussed with her  Procedure: Excision of Ingrown Toenail Location: Left 1st toe medial nail borders. Anesthesia: Lidocaine 1% plain; 1.5 mL and Marcaine 0.5% plain; 1.5 mL, digital block. Skin Prep: Betadine. Dressing: Silvadene; telfa;  dry, sterile, compression dressing. Technique: Following skin prep, the toe was exsanguinated and a tourniquet was secured at the base of the toe. The affected nail border was freed, split with a nail splitter, and excised. Chemical matrixectomy was then performed with phenol and irrigated out with alcohol. The tourniquet was then removed and sterile dressing applied. Disposition: Patient tolerated procedure well.     Regarding her fifth adductovarus contracture hammertoe and tailor's bunion bilateral we discussed the etiology and treatment options including nonsurgical treatment with wider shoes and offloading and silicone padding which I dispensed for her today and she will try wider shoes.  We also discussed surgical correction with an osteotomy of the fifth metatarsal as well as arthroplasty with the rotation of the fifth digit bilateral.  She says she is interested in surgical correction.  We discussed all risk benefits and potential complications including but not limited to pain, swelling, infection, scar, numbness which may be temporary or permanent, chronic pain, stiffness, nerve pain or damage, wound healing problems, bone healing problems including delayed or non-union.  Informed consent was signed and reviewed.  All questions were addressed.  No guarantees as to the outcome of surgery were made.  Surgery be scheduled at a time that she is able to do this between mission trips.  I discussed with her she would need at least 3 months to recover fully from both surgeries.  We will plan to do this bilateral with surgical shoe immobilization.    Surgical plan:  Procedure: -Bilateral tailor's bunion osteotomy and fifth hammertoe correction  Location: -[place]  Anesthesia plan: -IV  sedation with local anesthesia  Postoperative pain plan: - Tylenol 1000 mg every 6 hours, ibuprofen 600 mg every 6 hours, gabapentin 300 mg every 8 hours x5 days, oxycodone 5 mg 1-2 tabs every 6 hours only as  needed  DVT prophylaxis: -None required  WB Restrictions / DME needs: -WBAT in surgical shoes we will dispense at the surgical center     No follow-ups on file.

## 2021-05-08 ENCOUNTER — Telehealth: Payer: Self-pay

## 2021-05-08 NOTE — Telephone Encounter (Signed)
Received surgery paperwork from the [place] office. Left a message for [facility] to call and schedule surgery with Dr. Sherryle Lis

## 2021-05-12 ENCOUNTER — Ambulatory Visit: Payer: Medicare Other | Admitting: Nurse Practitioner

## 2021-06-25 ENCOUNTER — Other Ambulatory Visit: Payer: Self-pay | Admitting: Nurse Practitioner

## 2021-06-25 NOTE — Telephone Encounter (Signed)
Requested Prescriptions  ?Pending Prescriptions Disp Refills  ?? lisinopril (ZESTRIL) 20 MG tablet [Pharmacy Med Name: Lisinopril 20 MG Oral Tablet] 90 tablet 0  ?  Sig: TAKE 1 TABLET BY MOUTH ONCE DAILY (NEED VISIT FOR FURTHER REFILLS)  ?  ? Cardiovascular:  ACE Inhibitors Passed - 06/25/2021 11:00 AM  ?  ?  Passed - Cr in normal range and within 180 days  ?  Creatinine, Ser  ?Date Value Ref Range Status  ?04/14/2021 0.88 0.57 - 1.00 mg/dL Final  ?   ?  ?  Passed - K in normal range and within 180 days  ?  Potassium  ?Date Value Ref Range Status  ?04/14/2021 4.1 3.5 - 5.2 mmol/L Final  ?   ?  ?  Passed - Patient is not pregnant  ?  ?  Passed - Last BP in normal range  ?  BP Readings from Last 1 Encounters:  ?05/06/21 122/72  ?   ?  ?  Passed - Valid encounter within last 6 months  ?  Recent Outpatient Visits   ?      ? 1 month ago Raynaud's disease without gangrene  ? [place], [place]iman T, NP  ? 1 month ago Paronychia of great toe  ? [facility] Vigg, Avanti, MD  ? 2 months ago Stage 3a chronic kidney disease ([place])  ? [facility] [place], [place] T, NP  ? 9 months ago COVID-19  ? [facility] Vigg, Avanti, MD  ? 1 year ago Stage 3a chronic kidney disease ([place])  ? [facility] [place], Henrine Screws T, NP  ?  ?  ?Future Appointments   ?        ? In 1 week Venita Lick, NP [facility]  ? In 4 months  [place]  ?  ? ?  ?  ?  ? ? ?

## 2021-07-03 NOTE — Patient Instructions (Incomplete)

## 2021-07-04 ENCOUNTER — Ambulatory Visit: Payer: Medicare Other | Admitting: Nurse Practitioner

## 2021-07-04 DIAGNOSIS — I10 Essential (primary) hypertension: Secondary | ICD-10-CM

## 2021-07-04 DIAGNOSIS — E782 Mixed hyperlipidemia: Secondary | ICD-10-CM

## 2021-07-04 DIAGNOSIS — I7300 Raynaud's syndrome without gangrene: Secondary | ICD-10-CM

## 2021-07-04 DIAGNOSIS — N1831 Chronic kidney disease, stage 3a: Secondary | ICD-10-CM

## 2021-07-08 ENCOUNTER — Ambulatory Visit (INDEPENDENT_AMBULATORY_CARE_PROVIDER_SITE_OTHER): Payer: Medicare Other | Admitting: Nurse Practitioner

## 2021-07-08 ENCOUNTER — Encounter: Payer: Self-pay | Admitting: Nurse Practitioner

## 2021-07-08 ENCOUNTER — Ambulatory Visit: Payer: Self-pay | Admitting: *Deleted

## 2021-07-08 VITALS — BP 122/71 | HR 88 | Temp 98.2°F | Wt 103.4 lb

## 2021-07-08 DIAGNOSIS — R079 Chest pain, unspecified: Secondary | ICD-10-CM | POA: Diagnosis not present

## 2021-07-08 DIAGNOSIS — M62838 Other muscle spasm: Secondary | ICD-10-CM

## 2021-07-08 NOTE — Progress Notes (Signed)
? ?BP 122/71   Pulse 88   Temp 98.2 ?F (36.8 ?C) (Oral)   Wt 103 lb 6.4 oz (46.9 kg)   SpO2 97%   BMI 18.91 kg/m?   ? ?Subjective:  ? ? Patient ID: Danielle Stephens, female    DOB: 04-Mar-1951, 71 y.o.   MRN: [id number] ? ?HPI: ?Danielle Stephens is a 71 y.o. female ? ?Chief Complaint  ?Patient presents with  ? Chest Pain  ?  Underneath R breast, around 4 AM. Pt reports pain has subsided but still having soreness.   ? ?Patient states she woke up with pain under her right breast area that started at 4am.  She took an ASA and laid back down elevated.  States the pain continued.  She took her blood pressure and cholesterol medication and 2 tylenol.  States as the day went on her shoulder and her posterior scapula.  The pain improved with the tylenol.   ? ? ?Relevant past medical, surgical, family and social history reviewed and updated as indicated. Interim medical history since our last visit reviewed. ?Allergies and medications reviewed and updated. ? ?Review of Systems  ?Musculoskeletal:   ?     Right breast, shoulder and chest pain.  ? ?Per HPI unless specifically indicated above ? ?   ?Objective:  ?  ?BP 122/71   Pulse 88   Temp 98.2 ?F (36.8 ?C) (Oral)   Wt 103 lb 6.4 oz (46.9 kg)   SpO2 97%   BMI 18.91 kg/m?   ?Wt Readings from Last 3 Encounters:  ?07/08/21 103 lb 6.4 oz (46.9 kg)  ?05/06/21 106 lb (48.1 kg)  ?04/28/21 104 lb 12.8 oz (47.5 kg)  ?  ?Physical Exam ?Vitals and nursing note reviewed.  ?Constitutional:   ?   General: She is not in acute distress. ?   Appearance: Normal appearance. She is normal weight. She is not ill-appearing, toxic-appearing or diaphoretic.  ?HENT:  ?   Head: Normocephalic.  ?   Right Ear: External ear normal.  ?   Left Ear: External ear normal.  ?   Nose: Nose normal.  ?   Mouth/Throat:  ?   Mouth: Mucous membranes are moist.  ?   Pharynx: Oropharynx is clear.  ?Eyes:  ?   General:     ?   Right eye: No discharge.     ?   Left eye: No discharge.  ?   Extraocular Movements: Extraocular  movements intact.  ?   Conjunctiva/sclera: Conjunctivae normal.  ?   Pupils: Pupils are equal, round, and reactive to light.  ?Cardiovascular:  ?   Rate and Rhythm: Normal rate and regular rhythm.  ?   Heart sounds: No murmur heard. ?Pulmonary:  ?   Effort: Pulmonary effort is normal. No respiratory distress.  ?   Breath sounds: Normal breath sounds. No wheezing or rales.  ?Chest:  ? ? ?Musculoskeletal:  ?   Cervical back: Normal range of motion and neck supple.  ?     Back: ? ?Skin: ?   General: Skin is warm and dry.  ?   Capillary Refill: Capillary refill takes less than 2 seconds.  ?Neurological:  ?   General: No focal deficit present.  ?   Mental Status: She is alert and oriented to person, place, and time. Mental status is at baseline.  ?Psychiatric:     ?   Mood and Affect: Mood normal.     ?   Behavior: Behavior normal.     ?  Thought Content: Thought content normal.     ?   Judgment: Judgment normal.  ? ? ?Results for orders placed or performed in visit on 05/06/21  ?Uric acid  ?Result Value Ref Range  ? Uric Acid 4.6 3.0 - 7.2 mg/dL  ?CBC with Differential/Platelet  ?Result Value Ref Range  ? WBC 5.1 3.4 - 10.8 x10E3/uL  ? RBC 4.58 3.77 - 5.28 x10E6/uL  ? Hemoglobin 12.4 11.1 - 15.9 g/dL  ? Hematocrit 38.4 34.0 - 46.6 %  ? MCV 84 79 - 97 fL  ? MCH 27.1 26.6 - 33.0 pg  ? MCHC 32.3 31.5 - 35.7 g/dL  ? RDW 12.8 11.7 - 15.4 %  ? Platelets 221 150 - 450 x10E3/uL  ? Neutrophils 64 Not Estab. %  ? Lymphs 21 Not Estab. %  ? Monocytes 11 Not Estab. %  ? Eos 3 Not Estab. %  ? Basos 1 Not Estab. %  ? Neutrophils Absolute 3.3 1.4 - 7.0 x10E3/uL  ? Lymphocytes Absolute 1.1 0.7 - 3.1 x10E3/uL  ? Monocytes Absolute 0.5 0.1 - 0.9 x10E3/uL  ? EOS (ABSOLUTE) 0.2 0.0 - 0.4 x10E3/uL  ? Basophils Absolute 0.0 0.0 - 0.2 x10E3/uL  ? Immature Granulocytes 0 Not Estab. %  ? Immature Grans (Abs) 0.0 0.0 - 0.1 x10E3/uL  ?Iron Binding Cap (TIBC)([facility])  ?Result Value Ref Range  ? Total Iron Binding Capacity 316 250 - 450  ug/dL  ? UIBC 243 118 - 369 ug/dL  ? Iron 73 27 - 139 ug/dL  ? Iron Saturation 23 15 - 55 %  ?Ferritin  ?Result Value Ref Range  ? Ferritin 56 15 - 150 ng/mL  ? ?   ?Assessment & Plan:  ? ?Problem List Items Addressed This Visit   ?None ?Visit Diagnoses   ? ? Chest pain, unspecified type    -  Primary  ? EKG showed NSR. No further workup at this time. If symptoms persist can send to Cardiology for hoilter monitor.  ? Relevant Orders  ? EKG 12-Lead (Completed)  ? Muscle spasm      ? Recommend using Tylenol as needed for pain. Can use heating pad as needed for pain. If symptoms persist follow up for further evaluation.   ? ?  ?  ? ?Follow up plan: ?Return if symptoms worsen or fail to improve. ? ? ? ? ? ?

## 2021-07-08 NOTE — Telephone Encounter (Signed)
?  Chief Complaint: right breast, chest and shoulder pain and pain under right shoulder blade since 4:30 AM this morning.   ?Symptoms: above ?Frequency: Since 4:30 this morning.   Has history of 3 right shoulder surgeries ?Pertinent Negatives: Patient denies shortness of breath, sweating, dizziness, cardiac history ?Disposition: '[]'$ ED /'[]'$ Urgent Care (no appt availability in office) / '[x]'$ Appointment(In office/virtual)/ '[]'$  [place] [facility]/ '[]'$ Home Care/ '[]'$ Refused Recommended Disposition /'[]'$  Mobile Bus/ '[]'$  Follow-up with PCP ?Additional Notes: ED instructions given s/s to go for.   Appt made for today with Danielle Billings, NP at 2:00.  ?

## 2021-07-08 NOTE — Telephone Encounter (Signed)
Reason for Disposition ? [1] Breast pain AND [2] cause is not known ? ?Answer Assessment - Initial Assessment Questions ?1. SYMPTOM: "What's the main symptom you're concerned about?"  (e.g., lump, pain, rash, nipple discharge) ?    Pain in right breast.   I've had 3 shoulder surgeries on the right side too.  Pain in shoulder and into breast.   Radiated into back.   I picked up 2 trash bags and carried them to the trash.   No pain or injuries. ?I'm leaving to leave out of the country tomorrow morning.  ?2. LOCATION: "Where is the pain located?" ?    See above.    ?3. ONSET: "When did  start?" ?    4:30 AM this morning.   It woke me up.   I took an aspirin.  The pain was under my right shoulder blade.   ?4. PRIOR HISTORY: "Do you have any history of prior problems with your breasts?" (e.g., lumps, cancer, fibrocystic breast disease) ?    No cardiac history.   No breast history.    Right shoulder not limited in movement. ?Right now I have a headache from the pressure.  I haven't checked my BP this morning.    The lower portion of my right breast is sore.    ?5. CAUSE: "What do you think is causing this symptom?" ?    Shoulder ?6. OTHER SYMPTOMS: "Do you have any other symptoms?" (e.g., fever, breast pain, redness or rash, nipple discharge) ?    No discharge or swelling.   I'm small breasted.     I also have a dental cleaning today at 4:00. ?7. PREGNANCY-BREASTFEEDING: "Is there any chance you are pregnant?" "When was your last menstrual period?" "Are you breastfeeding?" ?    N/A ? ?Protocols used: Breast Symptoms-A-AH ? ?

## 2021-07-08 NOTE — Progress Notes (Signed)
Results discussed with patient during visit.

## 2021-07-20 NOTE — Patient Instructions (Signed)
Raynaud's Phenomenon  Raynaud's phenomenon is a condition that affects the blood vessels (arteries) that carry blood to the fingers and toes. The arteries that supply blood to the ears, lips, nipples, or the tip of the nose might also be affected. Raynaud's phenomenon causes the arteries to become narrow temporarily (spasm). As a result, the flow of blood to the affected areas is temporarily decreased. This usually occurs in response to cold temperatures or stress. During an attack, the skin in the affected areas turns white, then blue, and finally red. A person may also feel tingling or numbness in those areas. Attacks usually last for only a brief period, and then the blood flow to the area returns to normal. In most cases, Raynaud's phenomenon does not cause serious health problems. What are the causes? In many cases, the cause of this condition is not known. The condition may occur on its own (primary Raynaud's phenomenon) or may be associated with other diseases or factors (secondary Raynaud's phenomenon). Possible causes may include: Diseases or medical conditions that damage the arteries. Injuries and repetitive actions that hurt the hands or feet. Being exposed to certain chemicals. Taking medicines that narrow the arteries. Other medical conditions, such as lupus, scleroderma, rheumatoid arthritis, thyroid problems, blood disorders, Sjogren syndrome, or atherosclerosis. What increases the risk? The following factors may make you more likely to develop this condition: Being 20-40 years old. Being female. Having a family history of Raynaud's phenomenon. Living in a cold climate. Smoking. What are the signs or symptoms? Symptoms of this condition usually occur when you are exposed to cold temperatures or when you have emotional stress. The symptoms may last for a few minutes or up to several hours. They usually affect your fingers but may also affect your toes, nipples, lips, ears, or the  tip of your nose. Symptoms may include: Changes in skin color. The skin in the affected areas will turn pale or white. The skin may then change from white to bluish to red as normal blood flow returns to the area. Numbness, tingling, or pain in the affected areas. In severe cases, symptoms may include: Skin sores. Tissues decaying and dying (gangrene). How is this diagnosed? This condition may be diagnosed based on: Your symptoms and medical history. A physical exam. During the exam, you may be asked to put your hands in cold water to check for a reaction to cold temperature. Tests, such as: Blood tests to check for other diseases or conditions. A test to check the movement of blood through your arteries and veins (vascular ultrasound). A test in which the skin at the base of your fingernail is examined under a microscope (nailfold capillaroscopy). How is this treated? During an episode, you can take actions to help symptoms go away faster. Options include moving your arms around in a windmill pattern, warming your fingers under warm water, or placing your fingers in a warm body fold, such as your armpit. Long-term treatment for this condition often involves making lifestyle changes and taking steps to control your exposure to cold temperature. For more severe cases, medicine (calcium channel blockers) may be used to improve blood circulation. Follow these instructions at home: Avoiding cold temperatures Take these steps to avoid exposure to cold: If possible, stay indoors during cold weather. When you go outside during cold weather, dress in layers and wear mittens, a hat, a scarf, and warm footwear. Wear mittens or gloves when handling ice or frozen food. Use holders for glasses or cans containing   cold drinks. Let warm water run for a while before taking a shower or bath. Warm up the car before driving in cold weather. Lifestyle If possible, avoid stressful and emotional situations. Try  to find ways to manage your stress, such as: Exercise. Yoga. Meditation. Biofeedback. Do not use any products that contain nicotine or tobacco. These products include cigarettes, chewing tobacco, and vaping devices, such as e-cigarettes. If you need help quitting, ask your health care provider. Avoid secondhand smoke. Limit your use of caffeine. Switch to decaffeinated coffee, tea, and soda. Avoid chocolate. Avoid vibrating tools and machinery. General instructions Protect your hands and feet from injuries, cuts, or bruises. Avoid wearing tight rings or wristbands. Wear loose fitting socks and comfortable, roomy shoes. Take over-the-counter and prescription medicines only as told by your health care provider. Where to find support Raynaud's Association: www.raynauds.org Where to find more information National Institute of Arthritis and Musculoskeletal and Skin Diseases: www.niams.nih.gov Contact a health care provider if: Your discomfort becomes worse despite lifestyle changes. You develop sores on your fingers or toes that do not heal. You have breaks in the skin on your fingers or toes. You have a fever. You have pain or swelling in your joints. You have a rash. Your symptoms occur on only one side of your body. Get help right away if: Your fingers or toes turn black. You have severe pain in the affected areas. These symptoms may represent a serious problem that is an emergency. Do not wait to see if the symptoms will go away. Get medical help right away. Call your local emergency services (911 in the [place] Do not drive yourself to the hospital. Summary Raynaud's phenomenon is a condition that affects the arteries that carry blood to the fingers, toes, ears, lips, nipples, or the tip of the nose. In many cases, the cause of this condition is not known. Symptoms of this condition include changes in skin color along with numbness and tingling in the affected area. Treatment for  this condition includes lifestyle changes and reducing exposure to cold temperatures. Medicines may be used for severe cases of the condition. Contact your health care provider if your condition worsens despite treatment. This information is not intended to replace advice given to you by your health care provider. Make sure you discuss any questions you have with your health care provider. Document Revised: 05/07/2020 Document Reviewed: 05/07/2020 Elsevier Patient Education  2023 [employer] Inc.  

## 2021-07-23 ENCOUNTER — Ambulatory Visit (INDEPENDENT_AMBULATORY_CARE_PROVIDER_SITE_OTHER): Payer: Medicare Other | Admitting: Nurse Practitioner

## 2021-07-23 ENCOUNTER — Telehealth: Payer: Self-pay

## 2021-07-23 ENCOUNTER — Encounter: Payer: Self-pay | Admitting: Nurse Practitioner

## 2021-07-23 VITALS — BP 116/83 | HR 76 | Temp 98.0°F | Ht 62.01 in | Wt 108.0 lb

## 2021-07-23 DIAGNOSIS — I7300 Raynaud's syndrome without gangrene: Secondary | ICD-10-CM | POA: Diagnosis not present

## 2021-07-23 DIAGNOSIS — I10 Essential (primary) hypertension: Secondary | ICD-10-CM

## 2021-07-23 DIAGNOSIS — N1831 Chronic kidney disease, stage 3a: Secondary | ICD-10-CM | POA: Diagnosis not present

## 2021-07-23 DIAGNOSIS — M25511 Pain in right shoulder: Secondary | ICD-10-CM | POA: Diagnosis not present

## 2021-07-23 DIAGNOSIS — G4709 Other insomnia: Secondary | ICD-10-CM

## 2021-07-23 DIAGNOSIS — Z1211 Encounter for screening for malignant neoplasm of colon: Secondary | ICD-10-CM

## 2021-07-23 DIAGNOSIS — Z23 Encounter for immunization: Secondary | ICD-10-CM

## 2021-07-23 DIAGNOSIS — G8929 Other chronic pain: Secondary | ICD-10-CM

## 2021-07-23 DIAGNOSIS — G4700 Insomnia, unspecified: Secondary | ICD-10-CM | POA: Insufficient documentation

## 2021-07-23 DIAGNOSIS — E782 Mixed hyperlipidemia: Secondary | ICD-10-CM

## 2021-07-23 MED ORDER — LISINOPRIL 20 MG PO TABS: ORAL_TABLET | ORAL | 4 refills | Status: DC

## 2021-07-23 MED ORDER — AMLODIPINE BESYLATE 2.5 MG PO TABS
2.5000 mg | ORAL_TABLET | Freq: Every day | ORAL | 4 refills | Status: DC
Start: 2021-07-23 — End: 2022-08-04

## 2021-07-23 MED ORDER — GABAPENTIN 100 MG PO CAPS: 100.0000 mg | ORAL_CAPSULE | Freq: Every day | ORAL | 4 refills | Status: DC

## 2021-07-23 MED ORDER — ROSUVASTATIN CALCIUM 10 MG PO TABS
10.0000 mg | ORAL_TABLET | Freq: Every day | ORAL | 4 refills | Status: DC
Start: 2021-07-23 — End: 2022-08-04

## 2021-07-23 NOTE — Assessment & Plan Note (Signed)
Secondary to right shoulder pain.  Has tried OTC medications without benefit.  At this time trial Gabapentin 100 MG every night, educated on this and use.  May offer benefit to pain and sleep.  Return in 5 weeks for follow-up. ?

## 2021-07-23 NOTE — Assessment & Plan Note (Signed)
Chronic, ongoing, taking Crestor daily and tolerating.  Continue this medication regimen and adjust as needed.  Lipid panel and CMP today. 

## 2021-07-23 NOTE — Progress Notes (Signed)
? ?BP 116/83   Pulse 76   Temp 98 ?F (36.7 ?C) (Oral)   Ht 5' 2.01" (1.575 m)   Wt 108 lb (49 kg)   SpO2 99%   BMI 19.75 kg/m?   ? ?Subjective:  ? ? Patient ID: Danielle Stephens, female    DOB: 04-May-1950, 71 y.o.   MRN: [id number] ? ?HPI: ?Danielle Stephens is a 71 y.o. female ? ?Chief Complaint  ?Patient presents with  ? Hyperlipidemia  ?  Patient is requesting a refill on her medication.  ? Hypertension  ? Raynauds  ? Insomnia  ?  Patient says she is having trouble falling asleep and staying asleep. Patient states she has tried over the counter medication Melatonin to help sleep and she has noticed it is not helping her at all.   ? Shoulder Pain  ?  Patient states in March 2022 she had shoulder surgery and patient states a year later she is still experiencing pain in her right shoulder. Patient says she is not sure whom she is to speak with regarding her shoulder.   ? ?HYPERTENSION & HLD ?Continues on Lisinopril 20 MG daily + Amlodipine 2.5 MG.  Placed on Amlodipine last visit due to Raynaud's.  Taking Crestor for HLD. ?Hypertension status: stable  ?Satisfied with current treatment? yes ?Duration of hypertension: chronic ?BP monitoring frequency:  rarely ?BP range:  varies at time at home ?BP medication side effects:  no ?Medication compliance: good compliance ?Previous BP meds: Lisinopril ?Aspirin: no ?Recurrent headaches: no ?Visual changes: no ?Palpitations: no ?Dyspnea: none ?Chest pain: one episode recently with shoulder pain, no further episodes ?Lower extremity edema: none ?Dizzy/lightheaded: no  ?The 10-year ASCVD risk score (Arnett DK, et al., 2019) is: 8.4% ?  Values used to calculate the score: ?    Age: 16 years ?    Sex: Female ?    Is Non-Hispanic African American: No ?    Diabetic: No ?    Tobacco smoker: No ?    Systolic Blood Pressure: 209 mmHg ?    Is BP treated: Yes ?    HDL Cholesterol: 82 mg/dL ?    Total Cholesterol: 266 mg/dL ? ?CHRONIC KIDNEY DISEASE ?Recent labs stable. ?CKD status:  stable ?Medications renally dose: yes ?Previous renal evaluation: no ?Pneumovax:  Up to Date ?Influenza Vaccine:  Up to Date  ? ?INSOMNIA ?Present since her last surgery due to pain, wakes up with pain.   ?Duration: chronic ?Satisfied with sleep quality: no ?Difficulty falling asleep: no ?Difficulty staying asleep: yes ?Waking a few hours after sleep onset: yes ?Early morning awakenings: no ?Daytime hypersomnolence: no ?Wakes feeling refreshed: no ?Good sleep hygiene: yes ?Apnea: no ?Snoring: no ?Depressed/anxious mood: no ?Recent stress: no ?Restless legs/nocturnal leg cramps: no ?Chronic pain/arthritis: yes ?History of sleep study: no ?Treatments attempted: Melatonin, Zquil   ? ?SHOULDER PAIN ?Ongoing discomfort to right shoulder post surgery (has had 3 surgeries), went one time to pain management.  Also went to [facility], who sent to pain management -- she wants to fix problem.  Pain management offered shots and she obtained one, can not sleep after steroids.  She is right handed and can barely lift without pain.   ?Duration: chronic ?Involved shoulder: right ?Mechanism of injury: post surgical ongoing ?Location: diffuse ?Onset:gradual ?Severity: 10/10  ?Quality:  sharp, aching, and throbbing ?Frequency: intermittent ?Radiation: no ?Aggravating factors: lifting and movement  ?Alleviating factors: nothing  ?Status: fluctuating ?Treatments attempted: Steroid shots, Ibuprofen, Creams, Muscle  relaxer ?Relief with NSAIDs?:  none ?Weakness: yes ?Numbness: no ?Decreased grip strength: no ?Redness: no ?Swelling: no ?Bruising: no ?Fevers: no. ? ?Relevant past medical, surgical, family and social history reviewed and updated as indicated. Interim medical history since our last visit reviewed. ?Allergies and medications reviewed and updated. ? ?Review of Systems  ?Constitutional:  Negative for activity change, appetite change, diaphoresis, fatigue and fever.  ?Respiratory:  Negative for cough, chest tightness and  shortness of breath.   ?Cardiovascular:  Negative for chest pain, palpitations and leg swelling.  ?Gastrointestinal: Negative.   ?Musculoskeletal:  Positive for arthralgias.  ?Neurological: Negative.   ?Psychiatric/Behavioral:  Positive for sleep disturbance. Negative for decreased concentration, self-injury and suicidal ideas. The patient is not nervous/anxious.   ? ?Per HPI unless specifically indicated above ? ?   ?Objective:  ?  ?BP 116/83   Pulse 76   Temp 98 ?F (36.7 ?C) (Oral)   Ht 5' 2.01" (1.575 m)   Wt 108 lb (49 kg)   SpO2 99%   BMI 19.75 kg/m?   ?Wt Readings from Last 3 Encounters:  ?07/23/21 108 lb (49 kg)  ?07/08/21 103 lb 6.4 oz (46.9 kg)  ?05/06/21 106 lb (48.1 kg)  ?  ?Physical Exam ?Vitals and nursing note reviewed.  ?Constitutional:   ?   General: She is awake. She is not in acute distress. ?   Appearance: She is well-developed and well-groomed. She is not ill-appearing.  ?HENT:  ?   Head: Normocephalic.  ?   Right Ear: Hearing normal.  ?   Left Ear: Hearing normal.  ?Eyes:  ?   General: Lids are normal.     ?   Right eye: No discharge.     ?   Left eye: No discharge.  ?   Conjunctiva/sclera: Conjunctivae normal.  ?   Pupils: Pupils are equal, round, and reactive to light.  ?Neck:  ?   Thyroid: No thyromegaly.  ?   Vascular: No carotid bruit.  ?   Comments: No bruits auscultated. ?Cardiovascular:  ?   Rate and Rhythm: Normal rate and regular rhythm.  ?   Heart sounds: Normal heart sounds. No murmur heard. ?  No gallop.  ?Pulmonary:  ?   Effort: Pulmonary effort is normal.  ?   Breath sounds: Normal breath sounds.  ?Abdominal:  ?   General: Bowel sounds are normal.  ?   Palpations: Abdomen is soft.  ?Musculoskeletal:  ?   Right shoulder: Tenderness (posterior) present. No swelling, laceration, bony tenderness or crepitus. Decreased range of motion. Decreased strength.  ?   Left shoulder: Normal.  ?   Right hand: Normal.  ?   Left hand: Normal.  ?   Cervical back: Normal range of motion and  neck supple.  ?   Right lower leg: No edema.  ?   Left lower leg: No edema.  ?Skin: ?   General: Skin is warm and dry.  ?Neurological:  ?   Mental Status: She is alert and oriented to person, place, and time.  ?Psychiatric:     ?   Attention and Perception: Attention normal.     ?   Mood and Affect: Mood normal.     ?   Speech: Speech normal.     ?   Behavior: Behavior normal. Behavior is cooperative.     ?   Thought Content: Thought content normal.  ? ?Results for orders placed or performed in visit on 05/06/21  ?Uric acid  ?  Result Value Ref Range  ? Uric Acid 4.6 3.0 - 7.2 mg/dL  ?CBC with Differential/Platelet  ?Result Value Ref Range  ? WBC 5.1 3.4 - 10.8 x10E3/uL  ? RBC 4.58 3.77 - 5.28 x10E6/uL  ? Hemoglobin 12.4 11.1 - 15.9 g/dL  ? Hematocrit 38.4 34.0 - 46.6 %  ? MCV 84 79 - 97 fL  ? MCH 27.1 26.6 - 33.0 pg  ? MCHC 32.3 31.5 - 35.7 g/dL  ? RDW 12.8 11.7 - 15.4 %  ? Platelets 221 150 - 450 x10E3/uL  ? Neutrophils 64 Not Estab. %  ? Lymphs 21 Not Estab. %  ? Monocytes 11 Not Estab. %  ? Eos 3 Not Estab. %  ? Basos 1 Not Estab. %  ? Neutrophils Absolute 3.3 1.4 - 7.0 x10E3/uL  ? Lymphocytes Absolute 1.1 0.7 - 3.1 x10E3/uL  ? Monocytes Absolute 0.5 0.1 - 0.9 x10E3/uL  ? EOS (ABSOLUTE) 0.2 0.0 - 0.4 x10E3/uL  ? Basophils Absolute 0.0 0.0 - 0.2 x10E3/uL  ? Immature Granulocytes 0 Not Estab. %  ? Immature Grans (Abs) 0.0 0.0 - 0.1 x10E3/uL  ?Iron Binding Cap (TIBC)([facility])  ?Result Value Ref Range  ? Total Iron Binding Capacity 316 250 - 450 ug/dL  ? UIBC 243 118 - 369 ug/dL  ? Iron 73 27 - 139 ug/dL  ? Iron Saturation 23 15 - 55 %  ?Ferritin  ?Result Value Ref Range  ? Ferritin 56 15 - 150 ng/mL  ? ?   ?Assessment & Plan:  ? ?Problem List Items Addressed This Visit   ? ?  ? Cardiovascular and Mediastinum  ? Benign essential HTN  ?  Chronic, stable.  BP at goal today and on occasional home readings.  Continue current medication regimen and adjust as needed.  Will send refills as needed.  Recommend focus  on DASH diet at home and continue to monitor BP regularly.  LABS: CMP today. ? ?  ?  ? Relevant Medications  ? amLODipine (NORVASC) 2.5 MG tablet  ? lisinopril (ZESTRIL) 20 MG tablet  ? rosuvastatin (CRESTOR) 10 MG tablet  ? Other

## 2021-07-23 NOTE — Assessment & Plan Note (Signed)
Improved last check and on Lisinopril.  Labs: CMP today. ?

## 2021-07-23 NOTE — Assessment & Plan Note (Signed)
Ongoing, continue Amlodipine at this time -- is tolerating and BP improved. 

## 2021-07-23 NOTE — Assessment & Plan Note (Signed)
Ongoing, post multiple surgeries.  Continue to collaborate with Dr. Holley Raring at pain clinic, recommend she return for visit.  Start Gabapentin 100 MG every night, which may offer benefit to pain and sleep pattern.  Educated her on this medication and use + side effects.  Return in 5 weeks. ?

## 2021-07-23 NOTE — Telephone Encounter (Signed)
Called no answer phone just rang ?

## 2021-07-23 NOTE — Assessment & Plan Note (Signed)
Chronic, stable.  BP at goal today and on occasional home readings.  Continue current medication regimen and adjust as needed.  Will send refills as needed.  Recommend focus on DASH diet at home and continue to monitor BP regularly.  LABS: CMP today. ?

## 2021-07-24 LAB — COMPREHENSIVE METABOLIC PANEL
ALT: 16 IU/L (ref 0–32)
AST: 28 IU/L (ref 0–40)
Albumin/Globulin Ratio: 1.8 (ref 1.2–2.2)
Albumin: 4.6 g/dL (ref 3.8–4.8)
Alkaline Phosphatase: 66 IU/L (ref 44–121)
BUN/Creatinine Ratio: 15 (ref 12–28)
BUN: 13 mg/dL (ref 8–27)
Bilirubin Total: 0.3 mg/dL (ref 0.0–1.2)
CO2: 29 mmol/L (ref 20–29)
Calcium: 9.9 mg/dL (ref 8.7–10.3)
Chloride: 101 mmol/L (ref 96–106)
Creatinine, Ser: 0.85 mg/dL (ref 0.57–1.00)
Globulin, Total: 2.6 g/dL (ref 1.5–4.5)
Glucose: 75 mg/dL (ref 70–99)
Potassium: 4.4 mmol/L (ref 3.5–5.2)
Sodium: 142 mmol/L (ref 134–144)
Total Protein: 7.2 g/dL (ref 6.0–8.5)
eGFR: 75 mL/min/{1.73_m2} (ref >59)

## 2021-07-24 LAB — LIPID PANEL W/O CHOL/HDL RATIO
Cholesterol, Total: 173 mg/dL (ref 100–199)
HDL: 65 mg/dL (ref >39)
LDL Chol Calc (NIH): 86 mg/dL (ref 0–99)
Triglycerides: 124 mg/dL (ref 0–149)
VLDL Cholesterol Cal: 22 mg/dL (ref 5–40)

## 2021-07-24 NOTE — Progress Notes (Signed)
Contacted via MyChart ? ? ?Good morning Annie, your labs have returned and overall they look fantastic!!  Cholesterol levels are much improving with statin on board, your LDL have gone from 170 to 180 range to now 86!!  Continue the statin, it is helping.  Kidney function, creatinine and eGFR, remains normal, as is liver function, AST and ALT.  Any questions? ?Keep being amazing!!  Thank you for allowing me to participate in your care.  I appreciate you. ?Kindest regards, ?Jolene ?

## 2021-07-29 ENCOUNTER — Ambulatory Visit
Payer: Medicare Other | Attending: Student in an Organized Health Care Education/Training Program | Admitting: Student in an Organized Health Care Education/Training Program

## 2021-07-29 ENCOUNTER — Encounter: Payer: Self-pay | Admitting: Student in an Organized Health Care Education/Training Program

## 2021-07-29 VITALS — BP 149/80 | HR 73 | Temp 98.4°F | Resp 169 | Ht 62.0 in | Wt 108.0 lb

## 2021-07-29 DIAGNOSIS — Z96611 Presence of right artificial shoulder joint: Secondary | ICD-10-CM | POA: Diagnosis not present

## 2021-07-29 DIAGNOSIS — M75101 Unspecified rotator cuff tear or rupture of right shoulder, not specified as traumatic: Secondary | ICD-10-CM | POA: Diagnosis not present

## 2021-07-29 DIAGNOSIS — G8929 Other chronic pain: Secondary | ICD-10-CM | POA: Insufficient documentation

## 2021-07-29 DIAGNOSIS — M25511 Pain in right shoulder: Secondary | ICD-10-CM | POA: Insufficient documentation

## 2021-07-29 DIAGNOSIS — G5681 Other specified mononeuropathies of right upper limb: Secondary | ICD-10-CM | POA: Diagnosis not present

## 2021-07-29 DIAGNOSIS — G894 Chronic pain syndrome: Secondary | ICD-10-CM | POA: Diagnosis not present

## 2021-07-29 DIAGNOSIS — M12811 Other specific arthropathies, not elsewhere classified, right shoulder: Secondary | ICD-10-CM | POA: Diagnosis not present

## 2021-07-29 MED ORDER — TRAMADOL HCL 50 MG PO TABS: 50.0000 mg | ORAL_TABLET | Freq: Every day | ORAL | 1 refills | Status: DC | PRN

## 2021-07-29 MED ORDER — GABAPENTIN 100 MG PO CAPS: ORAL_CAPSULE | ORAL | 0 refills | Status: DC

## 2021-07-29 NOTE — Progress Notes (Addendum)
PROVIDER NOTE: Information contained herein reflects review and annotations entered in association with encounter. Interpretation of such information and data should be left to medically-trained personnel. Information provided to patient can be located elsewhere in the medical record under "Patient Instructions". Document created using STT-dictation technology, any transcriptional errors that may result from process are unintentional.  ?  ?Patient: Danielle Stephens  Service Category: E/M  Provider: Gillis Santa, MD  ?DOB: 1950/03/28  DOS: 07/29/2021  Specialty: Interventional Pain Management  ?MRN: [id number]  Setting: Ambulatory outpatient  PCP: Venita Lick, NP  ?Type: Established Patient    Referring Provider: Venita Lick, NP  ?Location: Office  Delivery: Face-to-face    ? ?HPI  ?[name], a 71 y.o. year old female, is here today because of her Chronic right shoulder pain [M25.511, G89.29]. [name]'s primary complain today is Shoulder Pain (right) ?Last encounter: My last encounter with her was on 04/24/2021. ?Pertinent problems: [name] has H/O repair of right rotator cuff; Chronic right shoulder pain; History of right shoulder replacement; Right rotator cuff tear arthropathy; Chronic pain syndrome; and Suprascapular entrapment neuropathy of right side on their pertinent problem list. ?Pain Assessment: Severity of Chronic pain is reported as a 6 /10. Location: Shoulder  /right shoulder down arm to hand thumb next three finger. Onset: More than a month ago. Quality: Aching, Constant, Shooting. Timing: Constant. Modifying factor(s): Tylenol,. ?Vitals:  height is '5\' 2"'$  (1.575 m) and weight is 108 lb (49 kg). Her temperature is 98.4 ?F (36.9 ?C). Her blood pressure is 149/80 (abnormal) and her pulse is 73. Her respiration is 169 (abnormal) and oxygen saturation is 100%.  ? ?Reason for encounter: evaluation of worsening, or previously known (established) problem. ? ?He presents today for worsening right  shoulder pain, limited range of motion and right shoulder weakness.  She underwent her third shoulder surgery which was a right reverse total shoulder arthroplasty on 05/20/2020 with no benefit.  She has severe right shoulder pain and limited range of motion.  She does not want any additional surgeries, she is already had 3 over the course of 2 years for her right shoulder. ? ?Prior to surgery, patient underwent a right suprascapular nerve block with me on 01/06/2021 that was helpful for her right shoulder pain and range of motion.  She is hoping to repeat this.  We also discussed suprascapular nerve stimulation as well as ablation which could be future options after suprascapular nerve block. ? ?We also discussed medication management.  She was recently started on gabapentin 100 mg nightly.  I provided her with titration instructions to increase her dose.  For her acute on chronic pain flares, we discussed tramadol 25 to 50 mg as needed for breakthrough pain.  Patient in agreement with plan.  UDS appropriate and up-to-date as below. ? ?Pharmacotherapy Assessment  ?Analgesic:  start Tramadol 50 mg daily prn severe breakthrough pain   ? ?Monitoring: ?[place] PMP: PDMP reviewed during this encounter.       ?Pharmacotherapy: No side-effects or adverse reactions reported. ?Compliance: No problems identified. ?Effectiveness: Clinically acceptable. ? ?Ignatius Specking, RN  07/29/2021  2:13 PM  Signed ?Safety precautions to be maintained throughout the outpatient stay will include: orient to surroundings, keep bed in low position, maintain call bell within reach at all times, provide assistance with transfer out of bed and ambulation.  ?   UDS:  ?Summary  ?Date Value Ref Range Status  ?12/23/2020 Note  Final  ?  Comment:  ?  ==================================================================== ?Compliance Drug Analysis, Ur ?==================================================================== ?Test                              Result       Flag       Units ? ?Drug Present and Declared for Prescription Verification ?  Tramadol                       290          EXPECTED   ng/mg creat ?  O-Desmethyltramadol            500          EXPECTED   ng/mg creat ?   Source of tramadol is a prescription medication. O-desmethyltramadol ?   is an expected metabolite of tramadol. ? ?  Acetaminophen                  PRESENT      EXPECTED ? ?Drug Present not Declared for Prescription Verification ?  Diphenhydramine                PRESENT      UNEXPECTED ? ?Drug Absent but Declared for Prescription Verification ?  Oxycodone                      Not Detected UNEXPECTED ng/mg creat ?  Tizanidine                     Not Detected UNEXPECTED ?   Tizanidine, as indicated in the declared medication list, is not ?   always detected even when used as directed. ? ?  Methocarbamol                  Not Detected UNEXPECTED ?  Doxylamine                     Not Detected UNEXPECTED ?==================================================================== ?Test                      Result    Flag   Units      Ref Range ?  Creatinine              30               mg/dL      >=20 ?==================================================================== ?Declared Medications: ? The flagging and interpretation on this report are based on the ? following declared medications.  Unexpected results may arise from ? inaccuracies in the declared medications. ? ? **Note: The testing scope of this panel includes these medications: ? ? Doxylamine ? Methocarbamol (Robaxin) ? Oxycodone (Roxicodone) ? Tramadol (Ultram) ? ? **Note: The testing scope of this panel does not include small to ? moderate amounts of these reported medications: ? ? Acetaminophen (Tylenol) ? Tizanidine (Zanaflex) ? ? **Note: The testing scope of this panel does not include the ? following reported medications: ? ? Biotin ? Fexofenadine (Allegra) ? Lisinopril (Zestril) ? Melatonin ? Meloxicam (Mobic) ? Multivitamin ?  Ondansetron (Zofran) ? Vitamin C ? Vitamin D ?==================================================================== ?For clinical consultation, please call [phone]. ?==================================================================== ?  ?  ? ?ROS  ?Constitutional: Denies any fever or chills ?Gastrointestinal: No reported hemesis, hematochezia, vomiting, or acute GI distress ?Musculoskeletal:  Severe right shoulder pain, right bicep pain, right scapular pain ?Neurological: No reported episodes of  acute onset apraxia, aphasia, dysarthria, agnosia, amnesia, paralysis, loss of coordination, or loss of consciousness ? ?Medication Review  ?Vitamin D, amLODipine, gabapentin, lactase, lisinopril, multivitamin, rosuvastatin, and traMADol ? ?History Review  ?Allergy: Danielle Stephens is allergic to lactose intolerance (gi), codeine, and tramadol. ?Drug: Danielle Stephens  reports no history of drug use. ?Alcohol:  reports no history of alcohol use. ?Tobacco:  reports that she has never smoked. She has never used smokeless tobacco. ?Social: Danielle Stephens  reports that she has never smoked. She has never used smokeless tobacco. She reports that she does not drink alcohol and does not use drugs. ?Medical:  has a past medical history of Anxiety, Chronic kidney disease, Hypertension, and Shoulder pain. ?Surgical: Danielle Stephens  has a past surgical history that includes Dilation and curettage, diagnostic / therapeutic; rotary cuff (Right, 03/06/2019); Shoulder arthroscopy (Right, 08/28/2019); Closed manipulation shoulder with steroid injection (Right, 08/28/2019); Colonoscopy; and Reverse shoulder arthroplasty (Right, 05/20/2020). ?Family: family history includes Cancer in her mother; Down syndrome in her brother; Heart attack in her brother and father; Hypertension in her father, mother, sister, sister, sister, sister, sister, and sister; Stroke in her sister. ? ?Laboratory Chemistry Profile  ? ?Renal ?Lab Results  ?Component Value Date  ? BUN 13 07/23/2021   ? CREATININE 0.85 07/23/2021  ? BCR 15 07/23/2021  ? GFRAA 71 02/16/2020  ? GFRNONAA >60 05/16/2020  ?  Hepatic ?Lab Results  ?Component Value Date  ? AST 28 07/23/2021  ? ALT 16 07/23/2021  ? ALBUMIN 4.6 07/23/2021

## 2021-07-29 NOTE — Progress Notes (Signed)
Safety precautions to be maintained throughout the outpatient stay will include: orient to surroundings, keep bed in low position, maintain call bell within reach at all times, provide assistance with transfer out of bed and ambulation.  

## 2021-07-29 NOTE — Patient Instructions (Signed)
____________________________________________________________________________________________ ? ?General Risks and Possible Complications ? ?Patient Responsibilities: It is important that you read this as it is part of your informed consent. It is our duty to inform you of the risks and possible complications associated with treatments offered to you. It is your responsibility as a patient to read this and to ask questions about anything that is not clear or that you believe was not covered in this document. ? ?Patient?s Rights: You have the right to refuse treatment. You also have the right to change your mind, even after initially having agreed to have the treatment done. However, under this last option, if you wait until the last second to change your mind, you may be charged for the materials used up to that point. ? ?Introduction: Medicine is not an Chief Strategy Officer. Everything in Medicine, including the lack of treatment(s), carries the potential for danger, harm, or loss (which is by definition: Risk). In Medicine, a complication is a secondary problem, condition, or disease that can aggravate an already existing one. All treatments carry the risk of possible complications. The fact that a side effects or complications occurs, does not imply that the treatment was conducted incorrectly. It must be clearly understood that these can happen even when everything is done following the highest safety standards. ? ?No treatment: You can choose not to proceed with the proposed treatment alternative. The ?PRO(s)? would include: avoiding the risk of complications associated with the therapy. The ?CON(s)? would include: not getting any of the treatment benefits. These benefits fall under one of three categories: diagnostic; therapeutic; and/or palliative. Diagnostic benefits include: getting information which can ultimately lead to improvement of the disease or symptom(s). Therapeutic benefits are those associated with the  successful treatment of the disease. Finally, palliative benefits are those related to the decrease of the primary symptoms, without necessarily curing the condition (example: decreasing the pain from a flare-up of a chronic condition, such as incurable terminal cancer). ? ?General Risks and Complications: These are associated to most interventional treatments. They can occur alone, or in combination. They fall under one of the following six (6) categories: no benefit or worsening of symptoms; bleeding; infection; nerve damage; allergic reactions; and/or death. ?No benefits or worsening of symptoms: In Medicine there are no guarantees, only probabilities. No healthcare provider can ever guarantee that a medical treatment will work, they can only state the probability that it may. Furthermore, there is always the possibility that the condition may worsen, either directly, or indirectly, as a consequence of the treatment. ?Bleeding: This is more common if the patient is taking a blood thinner, either prescription or over the counter (example: Goody Powders, Fish oil, Aspirin, Garlic, etc.), or if suffering a condition associated with impaired coagulation (example: Hemophilia, cirrhosis of the liver, low platelet counts, etc.). However, even if you do not have one on these, it can still happen. If you have any of these conditions, or take one of these drugs, make sure to notify your treating physician. ?Infection: This is more common in patients with a compromised immune system, either due to disease (example: diabetes, cancer, human immunodeficiency virus [HIV], etc.), or due to medications or treatments (example: therapies used to treat cancer and rheumatological diseases). However, even if you do not have one on these, it can still happen. If you have any of these conditions, or take one of these drugs, make sure to notify your treating physician. ?Nerve Damage: This is more common when the treatment is an invasive  one, but it can also happen with the use of medications, such as those used in the treatment of cancer. The damage can occur to small secondary nerves, or to large primary ones, such as those in the spinal cord and brain. This damage may be temporary or permanent and it may lead to impairments that can range from temporary numbness to permanent paralysis and/or brain death. ?Allergic Reactions: Any time a substance or material comes in contact with our body, there is the possibility of an allergic reaction. These can range from a mild skin rash (contact dermatitis) to a severe systemic reaction (anaphylactic reaction), which can result in death. ?Death: In general, any medical intervention can result in death, most of the time due to an unforeseen complication. ?____________________________________________________________________________________________ ______________________________________________________________________ ? ?Preparing for your procedure (without sedation) ? ?Procedure appointments are limited to planned procedures: ?No Prescription Refills. ?No disability issues will be discussed. ?No medication changes will be discussed. ? ?Instructions: ?Food Intake: Avoid eating anything for at least 4 hours prior to your procedure. ?Transportation: Unless otherwise stated by your physician, bring a driver. ?Morning Medicines: Take all of your scheduled morning medications. If you take heart medicine, except for blood thinners, do not forget to take it the morning of the procedure. If your Diastolic (lower reading) is above 100 mmHg, elective cases will be cancelled/rescheduled. ?Blood thinners: These will need to be stopped for procedures. Notify our staff if you are taking any blood thinners. Depending on which one you take, there will be specific instructions on how and when to stop it. ?Diabetics on insulin: Notify the staff so that you can be scheduled 1st case in the morning. If your diabetes requires high  dose insulin, take only ? of your normal insulin dose the morning of the procedure and notify the staff that you have done so. ?Preventing infections: Shower with an antibacterial soap the morning of your procedure.  ?Build-up your immune system: Take 1000 mg of Vitamin C with every meal (3 times a day) the day prior to your procedure. ?Antibiotics: Inform the staff if you have a condition or reason that requires you to take antibiotics before dental procedures. ?Pregnancy: If you are pregnant, call and cancel the procedure. ?Sickness: If you have a cold, fever, or any active infections, call and cancel the procedure. ?Arrival: You must be in the facility at least 30 minutes prior to your scheduled procedure. ?Children: Do not bring any children with you. ?Dress appropriately: There is always a possibility that your clothing may get soiled. ?Valuables: Do not bring any jewelry or valuables. ? ?Reasons to call and reschedule or cancel your procedure: (Following these recommendations will minimize the risk of a serious complication.) ?Surgeries: Avoid having procedures within 2 weeks of any surgery. (Avoid for 2 weeks before or after any surgery). ?Flu Shots: Avoid having procedures within 2 weeks of a flu shots or . (Avoid for 2 weeks before or after immunizations). ?Barium: Avoid having a procedure within 7-10 days after having had a radiological study involving the use of radiological contrast. (Myelograms, Barium swallow or enema study). ?Heart attacks: Avoid any elective procedures or surgeries for the initial 6 months after a "Myocardial Infarction" (Heart Attack). ?Blood thinners: It is imperative that you stop these medications before procedures. Let us know if you if you take any blood thinner.  ?Infection: Avoid procedures during or within two weeks of an infection (including chest colds or gastrointestinal problems). Symptoms associated with infections include: Localized redness, fever, chills,  night sweats  or profuse sweating, burning sensation when voiding, cough, congestion, stuffiness, runny nose, sore throat, diarrhea, nausea, vomiting, cold or Flu symptoms, recent or current infections. It is spe

## 2021-07-29 NOTE — Addendum Note (Signed)
Addended by: Gillis Santa on: 07/29/2021 02:51 PM ? ? Modules accepted: Orders ? ?

## 2021-08-06 ENCOUNTER — Encounter: Payer: Self-pay | Admitting: Student in an Organized Health Care Education/Training Program

## 2021-08-06 ENCOUNTER — Ambulatory Visit (HOSPITAL_BASED_OUTPATIENT_CLINIC_OR_DEPARTMENT_OTHER): Payer: Medicare Other | Admitting: Student in an Organized Health Care Education/Training Program

## 2021-08-06 ENCOUNTER — Other Ambulatory Visit: Payer: Self-pay

## 2021-08-06 ENCOUNTER — Ambulatory Visit
Admission: RE | Admit: 2021-08-06 | Discharge: 2021-08-06 | Disposition: A | Discharge status: Home / Self Care | Payer: Medicare Other | Source: Ambulatory Visit | Attending: Student in an Organized Health Care Education/Training Program | Admitting: Student in an Organized Health Care Education/Training Program

## 2021-08-06 VITALS — BP 163/92 | HR 84 | Temp 97.8°F | Resp 18 | Ht 62.0 in | Wt 108.0 lb

## 2021-08-06 DIAGNOSIS — R110 Nausea: Secondary | ICD-10-CM | POA: Insufficient documentation

## 2021-08-06 DIAGNOSIS — M75101 Unspecified rotator cuff tear or rupture of right shoulder, not specified as traumatic: Secondary | ICD-10-CM

## 2021-08-06 DIAGNOSIS — M12811 Other specific arthropathies, not elsewhere classified, right shoulder: Secondary | ICD-10-CM

## 2021-08-06 DIAGNOSIS — M25511 Pain in right shoulder: Secondary | ICD-10-CM | POA: Diagnosis not present

## 2021-08-06 DIAGNOSIS — G5681 Other specified mononeuropathies of right upper limb: Secondary | ICD-10-CM | POA: Diagnosis not present

## 2021-08-06 DIAGNOSIS — Z96611 Presence of right artificial shoulder joint: Secondary | ICD-10-CM | POA: Insufficient documentation

## 2021-08-06 DIAGNOSIS — G8929 Other chronic pain: Secondary | ICD-10-CM | POA: Insufficient documentation

## 2021-08-06 MED ORDER — ONDANSETRON HCL 4 MG PO TABS: 4.0000 mg | ORAL_TABLET | Freq: Every day | ORAL | 1 refills | Status: AC | PRN

## 2021-08-06 MED ORDER — DEXAMETHASONE SODIUM PHOSPHATE 10 MG/ML IJ SOLN
10.0000 mg | Freq: Once | INTRAMUSCULAR | Status: AC
  Administered 2021-08-06: 10 mg

## 2021-08-06 MED ORDER — DEXAMETHASONE SODIUM PHOSPHATE 10 MG/ML IJ SOLN: 10.0000 mg | Freq: Once | INTRAMUSCULAR | Status: DC

## 2021-08-06 MED ORDER — LIDOCAINE HCL 2 % IJ SOLN
20.0000 mL | Freq: Once | INTRAMUSCULAR | Status: AC
  Administered 2021-08-06: 400 mg

## 2021-08-06 MED ORDER — LIDOCAINE HCL 2 % IJ SOLN: 20.0000 mL | Freq: Once | INTRAMUSCULAR | Status: DC

## 2021-08-06 MED ORDER — DEXAMETHASONE SODIUM PHOSPHATE 10 MG/ML IJ SOLN
INTRAMUSCULAR | Status: AC
  Filled 2021-08-06: qty 1

## 2021-08-06 MED ORDER — IOHEXOL 180 MG/ML  SOLN
10.0000 mL | Freq: Once | INTRAMUSCULAR | Status: AC
  Administered 2021-08-06: 10 mL via INTRA_ARTICULAR

## 2021-08-06 MED ORDER — IOHEXOL 180 MG/ML  SOLN
10.0000 mL | Freq: Once | INTRAMUSCULAR | Status: DC
  Filled 2021-08-06: qty 20

## 2021-08-06 MED ORDER — ROPIVACAINE HCL 2 MG/ML IJ SOLN
INTRAMUSCULAR | Status: AC
  Filled 2021-08-06: qty 20

## 2021-08-06 MED ORDER — LIDOCAINE HCL 2 % IJ SOLN
INTRAMUSCULAR | Status: AC
  Filled 2021-08-06: qty 20

## 2021-08-06 MED ORDER — ROPIVACAINE HCL 2 MG/ML IJ SOLN: 4.0000 mL | Freq: Once | INTRAMUSCULAR | Status: DC

## 2021-08-06 MED ORDER — TRAMADOL HCL 50 MG PO TABS: 50.0000 mg | ORAL_TABLET | Freq: Every day | ORAL | 1 refills | Status: DC | PRN

## 2021-08-06 MED ORDER — ROPIVACAINE HCL 2 MG/ML IJ SOLN
4.0000 mL | Freq: Once | INTRAMUSCULAR | Status: AC
  Administered 2021-08-06: 4 mL via PERINEURAL

## 2021-08-06 NOTE — Patient Instructions (Addendum)
Danielle Stephens is a patient of mine at [facility] pain clinic.  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  She has to get up at times to help stretch her right shoulder.  Patient's chronic pain condition will limit her from participating in jury duty so I request that she be excused.  If you have any questions or concerns please not hesitate to reach out to our clinic.  Sincerely, Gillis Santa, MD        Pain Management Discharge Instructions  General Discharge Instructions :  If you need to reach your doctor call: Monday-Friday 8:00 am - 4:00 pm at [phone] or toll free [phone].  After clinic hours [phone] to have operator reach doctor.  Bring all of your medication bottles to all your appointments in the pain clinic.  To cancel or reschedule your appointment with Pain Management please remember to call 24 hours in advance to avoid a fee.  Refer to the educational materials which you have been given on: General Risks, I had my Procedure. Discharge Instructions, Post Sedation.  Post Procedure Instructions:  The drugs you were given will stay in your system until tomorrow, so for the next 24 hours you should not drive, make any legal decisions or drink any alcoholic beverages.  You may eat anything you prefer, but it is better to start with liquids then soups and crackers, and gradually work up to solid foods.  Please notify your doctor immediately if you have any unusual bleeding, trouble breathing or pain that is not related to your normal pain.  Depending on the type of procedure that was done, some parts of your body may feel week and/or numb.  This usually clears up by tonight or the next day.  Walk with the use of an assistive device or accompanied by an adult for the 24 hours.  You may use ice on the affected area for the first 24 hours.  Put ice in a Ziploc bag and cover with a towel and place against area 15 minutes on 15  minutes off.  You may switch to heat after 24 hours.

## 2021-08-06 NOTE — Progress Notes (Signed)
Safety precautions to be maintained throughout the outpatient stay will include: orient to surroundings, keep bed in low position, maintain call bell within reach at all times, provide assistance with transfer out of bed and ambulation.  

## 2021-08-06 NOTE — Progress Notes (Signed)
PROVIDER NOTE: Interpretation of information contained herein should be left to medically-trained personnel. Specific patient instructions are provided elsewhere under "Patient Instructions" section of medical record. This document was created in part using STT-dictation technology, any transcriptional errors that may result from this process are unintentional.  Patient: Danielle Stephens Type: Established DOB: [birth year] MRN: [id number] PCP: Venita Lick, NP  Service: Procedure DOS: 08/06/2021 Setting: Ambulatory Location: Ambulatory outpatient facility Delivery: Face-to-face Provider: Gillis Santa, MD Specialty: Interventional Pain Management Specialty designation: 09 Location: Outpatient facility Ref. Prov.: Gillis Santa, MD    Primary Reason for Visit: Interventional Pain Management Treatment. CC: Back Pain (Lumbar right side ) and Shoulder Pain (Right side )  Procedure:           Type: Suprascapular nerve block (SSNB) #2  Laterality:  Right Level: Superior to scapular spine, lateral to supraspinatus fossa (Suprascapular notch).  Imaging: Fluoroscopic guidance Anesthesia: Local anesthesia (1-2% Lidocaine) DOS: 08/06/2021  Performed by: Gillis Santa, MD  Purpose: Diagnostic/Therapeutic Indications: Shoulder pain, severe enough to impact quality of life and/or function. 1. Chronic right shoulder pain   2. Right rotator cuff tear arthropathy   3. 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 Nausea without vomiting    NAS-11 score:   Pre-procedure: 6 /10   Post-procedure: 4 /10     Target: Suprascapular nerve Location: midway between the medial border of the scapula and the acromion as it runs through the suprascapular notch. Region: Suprascapular, posterior shoulder  Approach: Percutaneous  Neuroanatomy: The suprascapular nerve is the lateral branch of the superior trunk of the brachial plexus. It receives nerve fibers that  originate in the nerve roots C5 and C6 (and sometimes C4). It is a mixed nerve, meaning that it provides both sensory and motor supply for the suprascapular region. Function: The main function of this nerve is to provide motor innervation for two muscles, the supraspinatus and infraspinatus muscles. They are part of the rotator cuff muscles. In addition, the suprascapular nerve provides a sensory supply to the joints of the scapula (glenohumeral and acromioclavicular joints). Rationale (medical necessity): procedure needed and proper for the diagnosis and/or treatment of the patient's medical symptoms and needs.  Position / Prep / Materials:  Position: Prone Materials:  Tray: Block Needle(s):  Type: Spinal  Gauge (G): 22  Length: 3.5 in.  Qty: 1 Prep solution: DuraPrep (Iodine Povacrylex [0.7% available iodine] and Isopropyl Alcohol, 74% w/w) Prep Area: Entire posterior shoulder area. From upper spine to shoulder proper (upper arm), and from lateral neck to lower tip of shoulder blade.   Pre-op H&P Assessment:  Danielle Stephens is a 71 y.o. (year old), female patient, seen today for interventional treatment. 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 Danielle Stephens has a current medication list which includes the following prescription(s): amlodipine, vitamin d, gabapentin, lactase, lisinopril, multivitamin, ondansetron, rosuvastatin, and tramadol. Her primarily concern today is the Back Pain (Lumbar right side ) and Shoulder Pain (Right side )  Initial Vital Signs:  Pulse/HCG Rate: 77  Temp: 97.8 F (36.6 C) Resp: 16 BP: (!) 147/76 SpO2: 100 %  BMI: Estimated body mass index is 19.75 kg/m as calculated from the following:   Height as of this  encounter: '5\' 2"'$  (1.575 m).  Weight as of this encounter: 108 lb (49 kg).  Risk Assessment: Allergies: Reviewed. She is allergic to lactose intolerance (gi), codeine, and tramadol.  Allergy Precautions: None required Coagulopathies: Reviewed. None identified.  Blood-thinner therapy: None at this time Active Infection(s): Reviewed. None identified. Danielle Stephens is afebrile  Site Confirmation: Danielle Stephens was asked to confirm the procedure and laterality before marking the site Procedure checklist: Completed Consent: Before the procedure and under the influence of no sedative(s), amnesic(s), or anxiolytics, the patient was informed of the treatment options, risks and possible complications. To fulfill our ethical and legal obligations, as recommended by the American Medical Association's Code of Ethics, I have informed the patient of my clinical impression; the nature and purpose of the treatment or procedure; the risks, benefits, and possible complications of the intervention; the alternatives, including doing nothing; the risk(s) and benefit(s) of the alternative treatment(s) or procedure(s); and the risk(s) and benefit(s) of doing nothing. The patient was provided information about the general risks and possible complications associated with the procedure. These may include, but are not limited to: failure to achieve desired goals, infection, bleeding, organ or nerve damage, allergic reactions, paralysis, and death. In addition, the patient was informed of those risks and complications associated to the procedure, such as failure to decrease pain; infection; bleeding; organ or nerve damage with subsequent damage to sensory, motor, and/or autonomic systems, resulting in permanent pain, numbness, and/or weakness of one or several areas of the body; allergic reactions; (i.e.: anaphylactic reaction); and/or death. Furthermore, the patient was informed of those risks and complications associated with the  medications. These include, but are not limited to: allergic reactions (i.e.: anaphylactic or anaphylactoid reaction(s)); adrenal axis suppression; blood sugar elevation that in diabetics may result in ketoacidosis or comma; water retention that in patients with history of congestive heart failure may result in shortness of breath, pulmonary edema, and decompensation with resultant heart failure; weight gain; swelling or edema; medication-induced neural toxicity; particulate matter embolism and blood vessel occlusion with resultant organ, and/or nervous system infarction; and/or aseptic necrosis of one or more joints. Finally, the patient was informed that Medicine is not an exact science; therefore, there is also the possibility of unforeseen or unpredictable risks and/or possible complications that may result in a catastrophic outcome. The patient indicated having understood very clearly. We have given the patient no guarantees and we have made no promises. Enough time was given to the patient to ask questions, all of which were answered to the patient's satisfaction. Ms. Batty has indicated that she wanted to continue with the procedure. Attestation: I, the ordering provider, attest that I have discussed with the patient the benefits, risks, side-effects, alternatives, likelihood of achieving goals, and potential problems during recovery for the procedure that I have provided informed consent. Date  Time: 08/06/2021  1:03 PM  Pre-Procedure Preparation:  Monitoring: As per clinic protocol. Respiration, ETCO2, SpO2, BP, heart rate and rhythm monitor placed and checked for adequate function Safety Precautions: Patient was assessed for positional comfort and pressure points before starting the procedure. Time-out: I initiated and conducted the "Time-out" before starting the procedure, as per protocol. The patient was asked to participate by confirming the accuracy of the "Time Out" information. Verification of  the correct person, site, and procedure were performed and confirmed by me, the nursing staff, and the patient. "Time-out" conducted as per Joint Commission's Universal Protocol (UP.01.01.01). Time: 1338  Description of Procedure:          Procedural  Technique Safety Precautions: Aspiration looking for blood return was conducted prior to all injections. At no point did we inject any substances, as a needle was being advanced. No attempts were made at seeking any paresthesias. Safe injection practices and needle disposal techniques used. Medications properly checked for expiration dates. SDV (single dose vial) medications used. Description of the Procedure: Protocol guidelines were followed. The patient was placed in position over the procedure table. The target area was identified and the area prepped in the usual manner. Skin & deeper tissues infiltrated with local anesthetic. Appropriate amount of time allowed to pass for local anesthetics to take effect. The procedure needles were then advanced to the target area. Proper needle placement secured. Negative aspiration confirmed. Solution injected in intermittent fashion, asking for systemic symptoms every 0.5cc of injectate. The needles were then removed and the area cleansed, making sure to leave some of the prepping solution back to take advantage of its long term bactericidal properties.  Vitals:   08/06/21 1308 08/06/21 1341  BP: (!) 147/76 (!) 163/92  Pulse: 77 84  Resp: 16 18  Temp: 97.8 F (36.6 C)   TempSrc: Temporal   SpO2: 100% 100%  Weight: 108 lb (49 kg)   Height: '5\' 2"'$  (1.575 m)      Start Time: 1338 hrs. End Time: 1340 hrs.  5 cc solution made of 4 cc of 0.2% ropivacaine, 1 cc of Decadron 10 mg/cc.  Injected along the right suprascapular nerve after contrast confirmation highlighting the suprascapular notch.  Imaging Guidance (Spinal):          Type of Imaging Technique: Fluoroscopy Guidance (Spinal) Indication(s):  Assistance in needle guidance and placement for procedures requiring needle placement in or near specific anatomical locations not easily accessible without such assistance. Exposure Time: Please see nurses notes. Contrast: Before injecting any contrast, we confirmed that the patient did not have an allergy to iodine, shellfish, or radiological contrast. Once satisfactory needle placement was completed at the desired level, radiological contrast was injected. Contrast injected under live fluoroscopy. No contrast complications. See chart for type and volume of contrast used. Fluoroscopic Guidance: I was personally present during the use of fluoroscopy. "Tunnel Vision Technique" used to obtain the best possible view of the target area. Parallax error corrected before commencing the procedure. "Direction-depth-direction" technique used to introduce the needle under continuous pulsed fluoroscopy. Once target was reached, antero-posterior, oblique, and lateral fluoroscopic projection used confirm needle placement in all planes. Images permanently stored in EMR. Interpretation: I personally interpreted the imaging intraoperatively. Adequate needle placement confirmed in multiple planes. Appropriate spread of contrast into desired area was observed. No evidence of afferent or efferent intravascular uptake. No intrathecal or subarachnoid spread observed. Permanent images saved into the patient's record.  Post-operative Assessment:  Post-procedure Vital Signs:  Pulse/HCG Rate: 84  Temp: 97.8 F (36.6 C) Resp: 18 BP:  (!) 163/92 SpO2: 100 %  EBL: None  Complications: No immediate post-treatment complications observed by team, or reported by patient.  Note: The patient tolerated the entire procedure well. A repeat set of vitals were taken after the procedure and the patient was kept under observation following institutional policy, for this type of procedure. Post-procedural neurological assessment was  performed, showing return to baseline, prior to discharge. The patient was provided with post-procedure discharge instructions, including a section on how to identify potential problems. Should any problems arise concerning this procedure, the patient was given instructions to immediately contact us, at any time, without hesitation. In  any case, we plan to contact the patient by telephone for a follow-up status report regarding this interventional procedure.  Comments:  No additional relevant information.  Plan of Care  Orders:  Orders Placed This Encounter  Procedures   DG PAIN [facility] C-ARM 1-60 MIN NO REPORT    Intraoperative interpretation by procedural physician at [facility].    Standing Status:   Standing    Number of Occurrences:   1    Order Specific Question:   Reason for exam:    Answer:   Assistance in needle guidance and placement for procedures requiring needle placement in or near specific anatomical locations not easily accessible without such assistance.   Chronic Opioid Analgesic:   Tramadol 50 mg daily prn severe breakthrough pain    Medications ordered for procedure: Meds ordered this encounter  Medications   DISCONTD: iohexol (OMNIPAQUE) 180 MG/ML injection 10 mL    Must be Myelogram-compatible. If not available, you may substitute with a water-soluble, non-ionic, hypoallergenic, myelogram-compatible radiological contrast medium.   DISCONTD: lidocaine (XYLOCAINE) 2 % (with pres) injection 400 mg   DISCONTD: dexamethasone (DECADRON) injection 10 mg   DISCONTD: ropivacaine (PF) 2 mg/mL (0.2%) (NAROPIN) injection 4 mL   ondansetron (ZOFRAN) 4 MG tablet    Sig: Take 1 tablet (4 mg total) by mouth daily as needed for nausea or vomiting.    Dispense:  30 tablet    Refill:  1   traMADol (ULTRAM) 50 MG tablet    Sig: Take 1 tablet (50 mg total) by mouth daily as needed for severe pain.    Dispense:  30 tablet    Refill:  1    Fill one day early if pharmacy is  closed on scheduled refill date.   iohexol (OMNIPAQUE) 180 MG/ML injection 10 mL    Must be Myelogram-compatible. If not available, you may substitute with a water-soluble, non-ionic, hypoallergenic, myelogram-compatible radiological contrast medium.   lidocaine (XYLOCAINE) 2 % (with pres) injection 400 mg   ropivacaine (PF) 2 mg/mL (0.2%) (NAROPIN) injection 4 mL   dexamethasone (DECADRON) injection 10 mg   Patient had nausea with 1 dose of tramadol.  I will send in a prescription for Zofran as above.  If she continues to have nausea, we will need to find an alternative to tramadol.  Medications administered: We administered iohexol, lidocaine, ropivacaine (PF) 2 mg/mL (0.2%), and dexamethasone.  See the medical record for exact dosing, route, and time of administration.  Follow-up plan:   Return in about 3 weeks (around 08/27/2021) for Post Procedure Evaluation, virtual.       Right SSNB 01/06/21, 08/06/21     Recent Visits Date Type Provider Dept  07/29/21 Office Visit Gillis Santa, MD [facility]  Showing recent visits within past 90 days and meeting all other requirements Today's Visits Date Type Provider Dept  08/06/21 Procedure visit Gillis Santa, MD [facility]  Showing today's visits and meeting all other requirements Future Appointments Date Type Provider Dept  08/21/21 Appointment Gillis Santa, MD [facility]  10/21/21 Appointment Gillis Santa, MD [facility]  Showing future appointments within next 90 days and meeting all other requirements  Disposition: Discharge home  Discharge (Date  Time): 08/06/2021; 1350 hrs.   Primary Care Physician: Venita Lick, NP Location: [facility] Note by: Gillis Santa, MD Date: 08/06/2021; Time: 2:20 PM  Disclaimer:  Medicine is not an exact science. The only guarantee in medicine is that  nothing is guaranteed. It is important to note that the decision to  proceed with this intervention was based on the information collected from the patient. The Data and conclusions were drawn from the patient's questionnaire, the interview, and the physical examination. Because the information was provided in large part by the patient, it cannot be guaranteed that it has not been purposely or unconsciously manipulated. Every effort has been made to obtain as much relevant data as possible for this evaluation. It is important to note that the conclusions that lead to this procedure are derived in large part from the available data. Always take into account that the treatment will also be dependent on availability of resources and existing treatment guidelines, considered by other Pain Management Practitioners as being common knowledge and practice, at the time of the intervention. For Medico-Legal purposes, it is also important to point out that variation in procedural techniques and pharmacological choices are the acceptable norm. The indications, contraindications, technique, and results of the above procedure should only be interpreted and judged by a Board-Certified Interventional Pain Specialist with extensive familiarity and expertise in the same exact procedure and technique.

## 2021-08-07 ENCOUNTER — Telehealth: Payer: Self-pay

## 2021-08-07 NOTE — Telephone Encounter (Signed)
Post procedure phone call. Patient states she is doing good.  

## 2021-08-15 ENCOUNTER — Telehealth: Payer: Self-pay | Admitting: Nurse Practitioner

## 2021-08-15 NOTE — Telephone Encounter (Signed)
Left a message for patient to give our office a call back to advise her of Karen's recommendations. Advised patient to give our office a call back.   OK for [facility] to give message if patient calls back.

## 2021-08-15 NOTE — Telephone Encounter (Signed)
Copied from [facility] [id number]. Topic: General - Inquiry >> Aug 15, 2021 11:17 AM Roslynn Amble wrote: Reason for CRM: Patient currently taking a Vitamin D supplement but has heard a prescription would be better and wants to know if she can get one called in to [facility], [address]  Phone:  [phone] Fax:  [phone]

## 2021-08-15 NOTE — Telephone Encounter (Signed)
Patient should continue her over the counter vitamin D supplement.  Her Vitamin D was within normal range last visit and the current supplement is working well for her.

## 2021-08-20 ENCOUNTER — Encounter: Payer: Self-pay | Admitting: Student in an Organized Health Care Education/Training Program

## 2021-08-21 ENCOUNTER — Ambulatory Visit
Payer: Medicare Other | Attending: Student in an Organized Health Care Education/Training Program | Admitting: Student in an Organized Health Care Education/Training Program

## 2021-08-21 DIAGNOSIS — G8929 Other chronic pain: Secondary | ICD-10-CM | POA: Diagnosis not present

## 2021-08-21 DIAGNOSIS — M25511 Pain in right shoulder: Secondary | ICD-10-CM

## 2021-08-21 DIAGNOSIS — G5681 Other specified mononeuropathies of right upper limb: Secondary | ICD-10-CM | POA: Diagnosis not present

## 2021-08-21 DIAGNOSIS — Z96611 Presence of right artificial shoulder joint: Secondary | ICD-10-CM | POA: Diagnosis not present

## 2021-08-21 DIAGNOSIS — M12811 Other specific arthropathies, not elsewhere classified, right shoulder: Secondary | ICD-10-CM

## 2021-08-21 DIAGNOSIS — M75101 Unspecified rotator cuff tear or rupture of right shoulder, not specified as traumatic: Secondary | ICD-10-CM | POA: Diagnosis not present

## 2021-08-21 DIAGNOSIS — G894 Chronic pain syndrome: Secondary | ICD-10-CM

## 2021-08-21 MED ORDER — GABAPENTIN 300 MG PO CAPS: 300.0000 mg | ORAL_CAPSULE | Freq: Every day | ORAL | 1 refills | Status: DC

## 2021-08-21 NOTE — Progress Notes (Signed)
Patient: Danielle Stephens  Service Category: E/M  Provider: Gillis Santa, MD  DOB: 02-16-1951  DOS: 08/21/2021  Location: Office  MRN: [id number]  Setting: Ambulatory outpatient  Referring Provider: Venita Lick, NP  Type: Established Patient  Specialty: Interventional Pain Management  PCP: Venita Lick, NP  Location: Remote location  Delivery: TeleHealth     Virtual Encounter - Pain Management PROVIDER NOTE: Information contained herein reflects review and annotations entered in association with encounter. Interpretation of such information and data should be left to medically-trained personnel. Information provided to patient can be located elsewhere in the medical record under "Patient Instructions". Document created using STT-dictation technology, any transcriptional errors that may result from process are unintentional.    Contact & Pharmacy Preferred: [phone] Home: [phone] (home) Mobile: [phone] (mobile) E-mail: [email or web address] .[place], [place] [address] [place] [address] Phone: [phone] Fax: [facility] [place], [address] [place] [place] [address] Phone: [phone] Fax: [phone]   Pre-screening  [name] offered "in-person" vs "virtual" encounter. She indicated preferring virtual for this encounter.   Reason COVID-19*  Social distancing based on CDC and AMA recommendations.   I contacted Danielle Eaves Gonzalo on 08/21/2021 via telephone.      I clearly identified myself as Gillis Santa, MD. I verified that I was speaking with the correct person using two identifiers (Name: Danielle Stephens, and date of birth: [birth year]).  Consent I sought verbal advanced consent from [place] for virtual visit interactions. I informed Danielle Stephens of possible security and privacy concerns, risks, and limitations associated with  providing "not-in-person" medical evaluation and management services. I also informed Danielle Stephens of the availability of "in-person" appointments. Finally, I informed her that there would be a charge for the virtual visit and that she could be  personally, fully or partially, financially responsible for it. Danielle Stephens expressed understanding and agreed to proceed.   Historic Elements   Danielle Stephens is a 71 y.o. year old, female patient evaluated today after our last contact on 08/06/2021. Danielle Stephens  has a past medical history of Anxiety, Chronic kidney disease, Hypertension, and Shoulder pain. 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 Ms. Bench has a current medication list which includes the following prescription(s): amlodipine, vitamin d, gabapentin, lactase, lisinopril, multivitamin, ondansetron, rosuvastatin, and tramadol. She  reports that she has never smoked. She has never used smokeless tobacco. She reports that she does not drink alcohol and does not use drugs. Danielle Stephens is allergic to lactose intolerance (gi), codeine, and tramadol.   HPI  Today, she is being contacted for a post-procedure assessment.   Post-procedure evaluation   Type: Suprascapular nerve block (SSNB) #2  Laterality:  Right Level: Superior to scapular spine, lateral to supraspinatus fossa (Suprascapular notch).  Imaging: Fluoroscopic guidance Anesthesia: Local anesthesia (1-2% Lidocaine) DOS: 08/06/2021  Performed by: Gillis Santa, MD  Purpose: Diagnostic/Therapeutic Indications: Shoulder pain, severe enough to impact quality of life and/or function. 1. Chronic right shoulder pain   2. Right rotator cuff tear arthropathy   3. History of right shoulder  replacement   4. Suprascapular entrapment neuropathy of  right side   5. Nausea without vomiting    NAS-11 score:   Pre-procedure: 6 /10   Post-procedure: 4 /10      Effectiveness:  Initial hour after procedure: 100 %  Subsequent 4-6 hours post-procedure: 100 %  Analgesia past initial 6 hours: 100% for 4-5 days Ongoing improvement:    Pharmacotherapy Assessment   Opioid Analgesic:  Tramadol 50 mg daily prn severe breakthrough pain    Monitoring: [place] PMP: PDMP reviewed during this encounter.       Pharmacotherapy: No side-effects or adverse reactions reported. Compliance: No problems identified. Effectiveness: Clinically acceptable. Plan: Refer to "POC". UDS:  Summary  Date Value Ref Range Status  12/23/2020 Note  Final    Comment:    ==================================================================== Compliance Drug Analysis, Ur ==================================================================== Test                             Result       Flag       Units  Drug Present and Declared for Prescription Verification   Tramadol                       290          EXPECTED   ng/mg creat   O-Desmethyltramadol            500          EXPECTED   ng/mg creat    Source of tramadol is a prescription medication. O-desmethyltramadol    is an expected metabolite of tramadol.    Acetaminophen                  PRESENT      EXPECTED  Drug Present not Declared for Prescription Verification   Diphenhydramine                PRESENT      UNEXPECTED  Drug Absent but Declared for Prescription Verification   Oxycodone                      Not Detected UNEXPECTED ng/mg creat   Tizanidine                     Not Detected UNEXPECTED    Tizanidine, as indicated in the declared medication list, is not    always detected even when used as directed.    Methocarbamol                  Not Detected UNEXPECTED   Doxylamine                     Not Detected  UNEXPECTED ==================================================================== Test                      Result    Flag   Units      Ref Range   Creatinine              30               mg/dL      >=20 ==================================================================== Declared Medications:  The flagging and interpretation on this report are based on the  following declared medications.  Unexpected results may arise from  inaccuracies in the declared medications.   **Note: The testing scope of this panel includes these medications:   Doxylamine  Methocarbamol (Robaxin)  Oxycodone (Roxicodone)  Tramadol (Ultram)   **Note: The testing scope of this panel does not include small to  moderate amounts of these reported medications:   Acetaminophen (Tylenol)  Tizanidine (Zanaflex)   **Note: The testing scope of this panel does not include the  following reported medications:   Biotin  Fexofenadine (Allegra)  Lisinopril (Zestril)  Melatonin  Meloxicam (Mobic)  Multivitamin  Ondansetron (Zofran)  Vitamin C  Vitamin D ==================================================================== For clinical consultation, please call [phone]. ====================================================================      Laboratory Chemistry Profile   Renal Lab Results  Component Value Date   BUN 13 07/23/2021   CREATININE 0.85 07/23/2021   BCR 15 07/23/2021   GFRAA 71 02/16/2020   GFRNONAA >60 05/16/2020    Hepatic Lab Results  Component Value Date   AST 28 07/23/2021   ALT 16 07/23/2021   ALBUMIN 4.6 07/23/2021   ALKPHOS 66 07/23/2021    Electrolytes Lab Results  Component Value Date   NA 142 07/23/2021   K 4.4 07/23/2021   CL 101 07/23/2021   CALCIUM 9.9 07/23/2021    Bone Lab Results  Component Value Date   VD25OH 35.8 04/14/2021    Inflammation (CRP: Acute Phase) (ESR: Chronic Phase) Lab Results  Component Value Date   CRP <1 04/14/2021   ESRSEDRATE 8  04/14/2021         Note: Above Lab results reviewed.    Assessment  The primary encounter diagnosis was Chronic right shoulder pain. Diagnoses of Right rotator cuff tear arthropathy, History of right shoulder replacement, Suprascapular entrapment neuropathy of right side, and Chronic pain syndrome were also pertinent to this visit.  Plan of Care   Ms. Avaline A Demeter has a current medication list which includes the following long-term medication(s): amlodipine, gabapentin, lisinopril, and rosuvastatin.  Unfortunately, Danaiya continues to have pretty severe right shoulder pain with difficulty with shoulder abduction and range of motion.  Suprascapular nerve block provided some pain relief although she continues to have pretty significant pain around her shoulder.  She does find benefit with keeping her shoulder in a sling position.  I recommend that she increase her gabapentin to 300 mg nightly and also increase her tramadol.  She is only taking 25 mg at night and I recommend that she increase her nighttime dose to 50 mg and also consider taking 25 mg during the day given her pain level.  Patient states that she will try that and we will touch base in about 8 to 10 weeks to see how she is doing with medication changes.   Pharmacotherapy (Medications Ordered): Meds ordered this encounter  Medications   gabapentin (NEURONTIN) 300 MG capsule    Sig: Take 1 capsule (300 mg total) by mouth at bedtime.    Dispense:  60 capsule    Refill:  1   Orders:  No orders of the defined types were placed in this encounter.  Follow-up plan:   Return in about 10 weeks (around 10/30/2021) for Medication Management, in person.     Right SSNB 01/06/21, 08/06/21      Recent Visits Date Type Provider Dept  08/06/21 Procedure visit Gillis Santa, MD [facility]  07/29/21 Office Visit Gillis Santa, MD [facility]  Showing recent visits within past 90 days and meeting all other  requirements Today's Visits Date Type Provider Dept  08/21/21 Office Visit Gillis Santa, MD [facility]  Showing today's visits and meeting all other requirements Future Appointments Date Type Provider  Dept  10/21/21 Appointment Gillis Santa, MD [facility]  Showing future appointments within next 90 days and meeting all other requirements  I discussed the assessment and treatment plan with the patient. The patient was provided an opportunity to ask questions and all were answered. The patient agreed with the plan and demonstrated an understanding of the instructions.  Patient advised to call back or seek an in-person evaluation if the symptoms or condition worsens.  Duration of encounter: 31mnutes.  Note by: BGillis Santa MD Date: 08/21/2021; Time: 2:09 PM

## 2021-08-29 ENCOUNTER — Telehealth: Payer: Self-pay | Admitting: Student in an Organized Health Care Education/Training Program

## 2021-08-29 DIAGNOSIS — Z96611 Presence of right artificial shoulder joint: Secondary | ICD-10-CM

## 2021-08-29 DIAGNOSIS — G8929 Other chronic pain: Secondary | ICD-10-CM

## 2021-08-29 DIAGNOSIS — M12811 Other specific arthropathies, not elsewhere classified, right shoulder: Secondary | ICD-10-CM

## 2021-08-29 NOTE — Telephone Encounter (Addendum)
Patient Danielle Stephens today at 9:06 states she wants to go ahead with another injection. She just spoke to Dr. Holley Raring on 08-21-21. Can she be scheduled for this or does she have to have another eval appt. ? No orders in chart. Please check with DR. L

## 2021-08-30 NOTE — Patient Instructions (Signed)

## 2021-09-01 ENCOUNTER — Encounter: Payer: Self-pay | Admitting: Nurse Practitioner

## 2021-09-01 ENCOUNTER — Ambulatory Visit (INDEPENDENT_AMBULATORY_CARE_PROVIDER_SITE_OTHER): Payer: Medicare Other | Admitting: Nurse Practitioner

## 2021-09-01 VITALS — BP 135/85 | HR 65 | Temp 97.8°F | Ht 62.01 in | Wt 109.0 lb

## 2021-09-01 DIAGNOSIS — Z23 Encounter for immunization: Secondary | ICD-10-CM

## 2021-09-01 DIAGNOSIS — M25511 Pain in right shoulder: Secondary | ICD-10-CM | POA: Diagnosis not present

## 2021-09-01 DIAGNOSIS — Z1211 Encounter for screening for malignant neoplasm of colon: Secondary | ICD-10-CM | POA: Diagnosis not present

## 2021-09-01 DIAGNOSIS — G8929 Other chronic pain: Secondary | ICD-10-CM | POA: Diagnosis not present

## 2021-09-01 DIAGNOSIS — I83819 Varicose veins of unspecified lower extremities with pain: Secondary | ICD-10-CM | POA: Diagnosis not present

## 2021-09-01 NOTE — Progress Notes (Signed)
BP 135/85   Pulse 65   Temp 97.8 F (36.6 C) (Oral)   Ht 5' 2.01" (1.575 m)   Wt 109 lb (49.4 kg)   SpO2 99%   BMI 19.93 kg/m    Subjective:    Patient ID: Danielle Stephens, female    DOB: 1950-07-13, 71 y.o.   MRN: [id number]  HPI: TYANNA Stephens is a 71 y.o. female  Chief Complaint  Patient presents with   Shoulder Pain    Had injection on 08/06/21 by Dr. Elwyn Lade office. Patient states that after injection the pain did not get better so they increased her sleep medication. Patient also states that when she needs pain medication she breaks her tramadol in half.   SHOULDER PAIN (RIGHT) Ongoing discomfort to right shoulder post surgery (had 3 surgeries).  Went to [facility], who sent to pain management.  Recent visit with pain management was on 08/21/21 -- having nerve blocks performed, last on 08/06/21.  At recent visit they increased Gabapentin to 300 MG (she is going to start this tonight) nightly and increased Tramadol to 50 MG nightly and may take 25 MG during day if needed (she reports she is taking this, but is breaking in 1/2 the tablets -- which helps her go to bed).  She feels now like the recent injection is offering benefit.  Pain is not going into fingers like it was, injection helped this.  She did attend PT for a long period.  She is right handed and can barely lift without pain.  Does report today she is having more pain to varicose veins in lower extremities, especially to upper ones on left leg.  Has seen vascular in past, 03/27/20.   Duration: chronic Involved shoulder: right Mechanism of injury:  post surgical Location: diffuse -- more anterior Onset:gradual Severity: 4/10  Quality:  sharp, aching, stabbing, tearing, and throbbing Frequency: constant Radiation: no Aggravating factors: lifting and movement  Alleviating factors: injection, Tramadol, Gabapentin  Status: stable Treatments attempted: injection, Tramadol, Gabapentin  Relief with NSAIDs?:  no Weakness:  yes Numbness: no Decreased grip strength:  slightly, longer periods she will lose grip Redness: no Swelling: no Bruising: no Fevers: no   Relevant past medical, surgical, family and social history reviewed and updated as indicated. Interim medical history since our last visit reviewed. Allergies and medications reviewed and updated.  Review of Systems  Constitutional:  Negative for activity change, appetite change, diaphoresis, fatigue and fever.  Respiratory:  Negative for cough, chest tightness and shortness of breath.   Cardiovascular:  Negative for chest pain, palpitations and leg swelling.  Gastrointestinal: Negative.   Musculoskeletal:  Positive for arthralgias.  Neurological: Negative.   Psychiatric/Behavioral:  Positive for sleep disturbance. Negative for decreased concentration, self-injury and suicidal ideas. The patient is not nervous/anxious.     Per HPI unless specifically indicated above     Objective:    BP 135/85   Pulse 65   Temp 97.8 F (36.6 C) (Oral)   Ht 5' 2.01" (1.575 m)   Wt 109 lb (49.4 kg)   SpO2 99%   BMI 19.93 kg/m   Wt Readings from Last 3 Encounters:  09/01/21 109 lb (49.4 kg)  08/06/21 108 lb (49 kg)  07/29/21 108 lb (49 kg)    Physical Exam Vitals and nursing note reviewed.  Constitutional:      General: She is awake. She is not in acute distress.    Appearance: She is well-developed and well-groomed.  She is not ill-appearing.  HENT:     Head: Normocephalic.     Right Ear: Hearing normal.     Left Ear: Hearing normal.  Eyes:     General: Lids are normal.        Right eye: No discharge.        Left eye: No discharge.     Conjunctiva/sclera: Conjunctivae normal.     Pupils: Pupils are equal, round, and reactive to light.  Neck:     Thyroid: No thyromegaly.     Vascular: No carotid bruit.  Cardiovascular:     Rate and Rhythm: Normal rate and regular rhythm.     Heart sounds: Normal heart sounds. No murmur heard.    No gallop.      Comments: Varicosities bilateral lower legs L>R. Pulmonary:     Effort: Pulmonary effort is normal.     Breath sounds: Normal breath sounds.  Abdominal:     General: Bowel sounds are normal.     Palpations: Abdomen is soft.  Musculoskeletal:     Right shoulder: Tenderness (posterior and anterior) present. No swelling, laceration, bony tenderness or crepitus. Decreased range of motion. Decreased strength.     Left shoulder: Normal.     Right hand: Normal.     Left hand: Normal.     Cervical back: Normal range of motion and neck supple.     Right lower leg: No edema.     Left lower leg: No edema.  Skin:    General: Skin is warm and dry.  Neurological:     Mental Status: She is alert and oriented to person, place, and time.  Psychiatric:        Attention and Perception: Attention normal.        Mood and Affect: Mood normal.        Speech: Speech normal.        Behavior: Behavior normal. Behavior is cooperative.        Thought Content: Thought content normal.    Results for orders placed or performed in visit on 07/23/21  Comprehensive metabolic panel  Result Value Ref Range   Glucose 75 70 - 99 mg/dL   BUN 13 8 - 27 mg/dL   Creatinine, Ser 0.85 0.57 - 1.00 mg/dL   eGFR 75 >59 mL/min/1.73   BUN/Creatinine Ratio 15 12 - 28   Sodium 142 134 - 144 mmol/L   Potassium 4.4 3.5 - 5.2 mmol/L   Chloride 101 96 - 106 mmol/L   CO2 29 20 - 29 mmol/L   Calcium 9.9 8.7 - 10.3 mg/dL   Total Protein 7.2 6.0 - 8.5 g/dL   Albumin 4.6 3.8 - 4.8 g/dL   Globulin, Total 2.6 1.5 - 4.5 g/dL   Albumin/Globulin Ratio 1.8 1.2 - 2.2   Bilirubin Total 0.3 0.0 - 1.2 mg/dL   Alkaline Phosphatase 66 44 - 121 IU/L   AST 28 0 - 40 IU/L   ALT 16 0 - 32 IU/L  Lipid Panel w/o Chol/HDL Ratio  Result Value Ref Range   Cholesterol, Total 173 100 - 199 mg/dL   Triglycerides 124 0 - 149 mg/dL   HDL 65 >39 mg/dL   VLDL Cholesterol Cal 22 5 - 40 mg/dL   LDL Chol Calc (NIH) 86 0 - 99 mg/dL       Assessment & Plan:   Problem List Items Addressed This Visit       Other   Chronic right shoulder pain -  Primary    Ongoing, post multiple surgeries, does report some improvement at this time after recent injection.  Continue to collaborate with Dr. Holley Raring at pain clinic, recommend she return for visit.  Continue current medication regimen.  Educated her on medication and use + side effects.  Return in 5 months.      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      Relevant Orders   Ambulatory referral to Vascular Surgery   Other Visit Diagnoses     Need for Td vaccine       Td vaccine today.   Relevant Orders   Td vaccine greater than or equal to 7yo preservative free IM (Completed)   Colon cancer screening       GI referral placed.   Relevant Orders   Ambulatory referral to Gastroenterology        Follow up plan: Return in about 5 months (around 02/01/2022) for HTN/HLD, VARICOSE VEINS, SHOULDER PAIN, CKD.

## 2021-09-01 NOTE — Assessment & Plan Note (Signed)
Will get her back into vascular as she reports pain is to upper posterior leg where varicosities are and they become inflamed.

## 2021-09-01 NOTE — Assessment & Plan Note (Signed)
Ongoing, post multiple surgeries, does report some improvement at this time after recent injection.  Continue to collaborate with Dr. Holley Raring at pain clinic, recommend she return for visit.  Continue current medication regimen.  Educated her on medication and use + side effects.  Return in 5 months.

## 2021-09-03 ENCOUNTER — Other Ambulatory Visit: Payer: Self-pay

## 2021-09-03 DIAGNOSIS — Z1211 Encounter for screening for malignant neoplasm of colon: Secondary | ICD-10-CM

## 2021-09-03 MED ORDER — NA SULFATE-K SULFATE-MG SULF 17.5-3.13-1.6 GM/177ML PO SOLN: 1.0000 | Freq: Once | ORAL | 0 refills | Status: AC

## 2021-09-03 NOTE — Progress Notes (Signed)
Gastroenterology Pre-Procedure Review Lina Sayre Date: 09/23/2021 Requesting Physician: Dr. Marius Ditch   PATIENT REVIEW QUESTIONS: The patient responded to the following health history questions as indicated:    1. Are you having any GI issues? no 2. Do you have a personal history of Polyps? no 3. Do you have a family history of Colon Cancer or Polyps? yes (mom colon cancer ) 4. Diabetes Mellitus? no 5. Joint replacements in the past 12 months?no 6. Major health problems in the past 3 months?no 7. Any artificial heart valves, MVP, or defibrillator?no    MEDICATIONS & ALLERGIES:    Patient reports the following regarding taking any anticoagulation/antiplatelet therapy:   Plavix, Coumadin, Eliquis, Xarelto, Lovenox, Pradaxa, Brilinta, or Effient? no Aspirin? no  Patient confirms/reports the following medications:  Current Outpatient Medications  Medication Sig Dispense Refill   amLODipine (NORVASC) 2.5 MG tablet Take 1 tablet (2.5 mg total) by mouth daily. 90 tablet 4   Cholecalciferol (VITAMIN D) 50 MCG (2000 UT) CAPS Take 2,000 Units by mouth daily. gummie     gabapentin (NEURONTIN) 300 MG capsule Take 1 capsule (300 mg total) by mouth at bedtime. 60 capsule 1   lactase (LACTAID) 3000 units tablet Take 1 tablet by mouth 3 (three) times daily with meals.     lisinopril (ZESTRIL) 20 MG tablet TAKE 1 TABLET BY MOUTH ONCE DAILY (NEED VISIT FOR FURTHER REFILLS) 90 tablet 4   Multiple Vitamin (MULTIVITAMIN) capsule Take 1 capsule by mouth daily.     ondansetron (ZOFRAN) 4 MG tablet Take 1 tablet (4 mg total) by mouth daily as needed for nausea or vomiting. 30 tablet 1   rosuvastatin (CRESTOR) 10 MG tablet Take 1 tablet (10 mg total) by mouth daily. 90 tablet 4   traMADol (ULTRAM) 50 MG tablet Take 1 tablet (50 mg total) by mouth daily as needed for severe pain. 30 tablet 1   No current facility-administered medications for this visit.    Patient confirms/reports the following allergies:   Allergies  Allergen Reactions   Lactose Intolerance (Gi) Diarrhea   Codeine Rash   Tramadol Hives    No orders of the defined types were placed in this encounter.   AUTHORIZATION INFORMATION Primary Insurance: 1D#: Group #:  Secondary Insurance: 1D#: Group #:  SCHEDULE INFORMATION: Date: 09/23/2021 Time: Location: [facility]

## 2021-09-08 ENCOUNTER — Encounter: Payer: Self-pay | Admitting: Gastroenterology

## 2021-09-09 ENCOUNTER — Ambulatory Visit: Payer: Self-pay | Admitting: *Deleted

## 2021-09-10 ENCOUNTER — Encounter: Payer: Self-pay | Admitting: Nurse Practitioner

## 2021-09-10 ENCOUNTER — Ambulatory Visit (INDEPENDENT_AMBULATORY_CARE_PROVIDER_SITE_OTHER): Payer: Medicare Other | Admitting: Nurse Practitioner

## 2021-09-10 DIAGNOSIS — H00021 Hordeolum internum right upper eyelid: Secondary | ICD-10-CM | POA: Diagnosis not present

## 2021-09-10 DIAGNOSIS — H00011 Hordeolum externum right upper eyelid: Secondary | ICD-10-CM | POA: Insufficient documentation

## 2021-09-10 MED ORDER — ERYTHROMYCIN 5 MG/GM OP OINT: 1.0000 | TOPICAL_OINTMENT | Freq: Every day | OPHTHALMIC | 0 refills | Status: AC

## 2021-09-10 NOTE — Assessment & Plan Note (Signed)
Ongoing for a month. No pain or erythema. Recommend warm compress a few times per day. Erythromycin ointment ordered. She is to follow up as needed.

## 2021-09-10 NOTE — Progress Notes (Deleted)
   There were no vitals taken for this visit.   Subjective:    Patient ID: Danielle Stephens, female    DOB: 1950/06/04, 71 y.o.   MRN: [id number]  HPI: Danielle Stephens is a 71 y.o. female  Chief Complaint  Patient presents with   Eye Pain   EYE PAIN Duration:  {Blank single:19197::"days","weeks","months"} Involved eye:  {Blank single:19197::"left","right","bilateral"} Onset: {Blank single:19197::"sudden","gradual"} Severity: {Blank single:19197::"mild","moderate","severe","1/10","2/10","3/10","4/10","5/10","6/10","7/10","8/10","9/10","10/10"}  Quality: {Blank multiple:19196::"sharp","dull","aching","burning","cramping","ill-defined","itchy","pressure-like","pulling","shooting","sore","stabbing","tender","tearing","throbbing"} Foreign body sensation:{Blank single:19197::"yes","no"} Visual impairment: {Blank single:19197::"yes","no"} Eye redness: {Blank single:19197::"yes","no"} Discharge: {Blank single:19197::"yes","no"} Crusting or matting of eyelids: {Blank single:19197::"yes","no"} Swelling: {Blank single:19197::"yes","no"} Photophobia: {Blank single:19197::"yes","no"} Itching: {Blank single:19197::"yes","no"} Tearing: {Blank single:19197::"yes","no"} Headache: {Blank single:19197::"yes","no"} Floaters: {Blank single:19197::"yes","no"} URI symptoms: {Blank single:19197::"yes","no"} Contact lens use: {Blank single:19197::"yes","no"} Close contacts with similar problems: {Blank single:19197::"yes","no"} Eye trauma: {Blank single:19197::"yes","no"} Aggravating factors:  Alleviating factors:  Status: {Blank multiple:19196::"better","worse","stable","fluctuating"} Treatments attempted:   Relevant past medical, surgical, family and social history reviewed and updated as indicated. Interim medical history since our last visit reviewed. Allergies and medications reviewed and updated.  Review of Systems  Per HPI unless specifically indicated above     Objective:    There were no vitals  taken for this visit.  Wt Readings from Last 3 Encounters:  09/01/21 109 lb (49.4 kg)  08/06/21 108 lb (49 kg)  07/29/21 108 lb (49 kg)    Physical Exam  Results for orders placed or performed in visit on 07/23/21  Comprehensive metabolic panel  Result Value Ref Range   Glucose 75 70 - 99 mg/dL   BUN 13 8 - 27 mg/dL   Creatinine, Ser 0.85 0.57 - 1.00 mg/dL   eGFR 75 >59 mL/min/1.73   BUN/Creatinine Ratio 15 12 - 28   Sodium 142 134 - 144 mmol/L   Potassium 4.4 3.5 - 5.2 mmol/L   Chloride 101 96 - 106 mmol/L   CO2 29 20 - 29 mmol/L   Calcium 9.9 8.7 - 10.3 mg/dL   Total Protein 7.2 6.0 - 8.5 g/dL   Albumin 4.6 3.8 - 4.8 g/dL   Globulin, Total 2.6 1.5 - 4.5 g/dL   Albumin/Globulin Ratio 1.8 1.2 - 2.2   Bilirubin Total 0.3 0.0 - 1.2 mg/dL   Alkaline Phosphatase 66 44 - 121 IU/L   AST 28 0 - 40 IU/L   ALT 16 0 - 32 IU/L  Lipid Panel w/o Chol/HDL Ratio  Result Value Ref Range   Cholesterol, Total 173 100 - 199 mg/dL   Triglycerides 124 0 - 149 mg/dL   HDL 65 >39 mg/dL   VLDL Cholesterol Cal 22 5 - 40 mg/dL   LDL Chol Calc (NIH) 86 0 - 99 mg/dL      Assessment & Plan:   Problem List Items Addressed This Visit   None    Follow up plan: No follow-ups on file.

## 2021-09-10 NOTE — Patient Instructions (Signed)
Stye ?A stye, also known as a hordeolum, is a bump that forms on an eyelid. It may look like a pimple next to the eyelash. A stye can form inside the eyelid (internal stye) or outside the eyelid (external stye). A stye can cause redness, swelling, and pain on the eyelid. ?Styes are very common. Anyone can get them at any age. They usually occur in just one eye at a time, but you may have more than one in either eye. ?What are the causes? ?A stye is caused by an infection. The infection is almost always caused by bacteria called Staphylococcus aureus. This is a common type of bacteria that lives on the skin. ?An internal stye may result from an infected oil-producing gland inside the eyelid. An external stye may be caused by an infection at the base of the eyelash (hair follicle). ?What increases the risk? ?You are more likely to develop a stye if: ?You have had a stye before. ?You have any of these conditions: ?Red, itchy, inflamed eyelids (blepharitis). ?A skin condition such as seborrheic dermatitis or rosacea. ?High fat levels in your blood (lipids). ?Dry eyes. ?What are the signs or symptoms? ?The most common symptom of a stye is eyelid pain. Internal styes are more painful than external styes. Other symptoms may include: ?Painful swelling of your eyelid. ?A scratchy feeling in your eye. ?Tearing and redness of your eye. ?A pimple-like bump on the edge of the eyelid. ?Pus draining from the stye. ?How is this diagnosed? ?Your health care provider may be able to diagnose a stye just by examining your eye. The health care provider may also check to make sure: ?You do not have a fever or other signs of a more serious infection. ?The infection has not spread to other parts of your eye or areas around your eye. ?How is this treated? ?Most styes will clear up in a few days without treatment or with warm compresses applied to the area. You may need to use antibiotic drops or ointment to treat an infection. Sometimes,  steroid drops or ointment are used in addition to antibiotics. ?In some cases, your health care provider may give you a small steroid injection in the eyelid. ?If your stye does not heal with routine treatment, your health care provider may drain pus from the stye using a thin blade or needle. This may be done if the stye is large, causing a lot of pain, or affecting your vision. ?Follow these instructions at home: ?Take over-the-counter and prescription medicines only as told by your health care provider. This includes eye drops or ointments. ?If you were prescribed an antibiotic medicine, steroid medicine, or both, apply or use them as told by your health care provider. Do not stop using the medicine even if your condition improves. ?Apply a warm, wet cloth (warm compress) to your eye for 5-10 minutes, 4 to 6 times a day. ?Clean the affected eyelid as directed by your health care provider. ?Do not wear contact lenses or eye makeup until your stye has healed and your health care provider says that it is safe. ?Do not try to pop or drain the stye. ?Do not rub your eye. ?Contact a health care provider if: ?You have chills or a fever. ?Your stye does not go away after several days. ?Your stye affects your vision. ?Your eyeball becomes swollen, red, or painful. ?Get help right away if: ?You have pain when moving your eye around. ?Summary ?A stye is a bump that forms   on an eyelid. It may look like a pimple next to the eyelash. ?A stye can form inside the eyelid (internal stye) or outside the eyelid (external stye). A stye can cause redness, swelling, and pain on the eyelid. ?Your health care provider may be able to diagnose a stye just by examining your eye. ?Apply a warm, wet cloth (warm compress) to your eye for 5-10 minutes, 4 to 6 times a day. ?This information is not intended to replace advice given to you by your health care provider. Make sure you discuss any questions you have with your health care  provider. ?Document Revised: 05/08/2020 Document Reviewed: 05/08/2020 ?Elsevier Patient Education ? 2023 [employer] Inc[employer] ? ?

## 2021-09-10 NOTE — Progress Notes (Signed)
BP 113/68   Pulse 69   Temp 98.3 F (36.8 C) (Oral)   Ht 5' 2.01" (1.575 m)   Wt 108 lb 9.6 oz (49.3 kg)   SpO2 98%   BMI 19.86 kg/m    Subjective:    Patient ID: Danielle Stephens, female    DOB: October 21, 1950, 71 y.o.   MRN: [id number]  HPI: Danielle Stephens is a 71 y.o. female  Chief Complaint  Patient presents with   Eyelid Problem    Patient is here for issue with her R eyelid with a knot. Patient says it was worse yesterday and it is barely noticeable today. Patient states she is supposed to leave for an out of country trip again in August and wants to follow up with provider.    NOTE WRITTEN BY FNP STUDENT.  ASSESSMENT AND PLAN OF CARE REVIEWED WITH STUDENT, AGREE WITH ABOVE FINDINGS AND PLAN.   EYE PAIN Small lump to upper right eyelid not causing any discomfort to patient. She says it started about a month ago and has not changed. Duration:  weeks Involved eye:  right Onset: gradual  Quality: No pain Foreign body sensation:no Visual impairment: no Eye redness: no Discharge: no Crusting or matting of eyelids: no Swelling: no Photophobia: no Itching: no Tearing: no Headache: no Floaters: no URI symptoms: no Contact lens use: no Close contacts with similar problems: no Eye trauma: no Aggravating factors: None Alleviating factors: None Status: stable Treatments attempted:   Relevant past medical, surgical, family and social history reviewed and updated as indicated. Interim medical history since our last visit reviewed. Allergies and medications reviewed and updated.  Review of Systems  Constitutional:  Negative for chills and fever.  HENT: Negative.    Eyes:  Negative for photophobia, pain, discharge, redness, itching and visual disturbance.  Respiratory:  Negative for cough and shortness of breath.   Cardiovascular: Negative.   Allergic/Immunologic: Negative.     Per HPI unless specifically indicated above     Objective:    BP 113/68   Pulse 69   Temp  98.3 F (36.8 C) (Oral)   Ht 5' 2.01" (1.575 m)   Wt 108 lb 9.6 oz (49.3 kg)   SpO2 98%   BMI 19.86 kg/m   Wt Readings from Last 3 Encounters:  09/10/21 108 lb 9.6 oz (49.3 kg)  09/01/21 109 lb (49.4 kg)  08/06/21 108 lb (49 kg)    Physical Exam Vitals and nursing note reviewed.  Constitutional:      Appearance: Normal appearance. She is well-groomed. She is not ill-appearing or toxic-appearing.  HENT:     Head: Atraumatic.     Right Ear: Hearing normal.     Left Ear: Hearing normal.  Eyes:     General: Lids are normal.        Right eye: Hordeolum (internal) present.        Left eye: No hordeolum.     Extraocular Movements: Extraocular movements intact.     Conjunctiva/sclera: Conjunctivae normal.     Right eye: No exudate.    Left eye: No exudate.    Pupils: Pupils are equal, round, and reactive to light.     Comments: Small, pinpoint in size lump to R upper eyelid   Cardiovascular:     Rate and Rhythm: Normal rate and regular rhythm.     Heart sounds: Normal heart sounds.  Pulmonary:     Effort: Pulmonary effort is normal. No accessory muscle usage.  Breath sounds: Normal breath sounds.  Lymphadenopathy:     Head:     Right side of head: No submental, submandibular, tonsillar, preauricular or posterior auricular adenopathy.     Left side of head: No submental, submandibular, tonsillar, preauricular or posterior auricular adenopathy.  Skin:    General: Skin is warm.  Neurological:     Mental Status: She is alert.  Psychiatric:        Attention and Perception: Attention normal.        Mood and Affect: Mood normal.        Speech: Speech normal.        Behavior: Behavior is cooperative.    Results for orders placed or performed in visit on 07/23/21  Comprehensive metabolic panel  Result Value Ref Range   Glucose 75 70 - 99 mg/dL   BUN 13 8 - 27 mg/dL   Creatinine, Ser 0.85 0.57 - 1.00 mg/dL   eGFR 75 >59 mL/min/1.73   BUN/Creatinine Ratio 15 12 - 28    Sodium 142 134 - 144 mmol/L   Potassium 4.4 3.5 - 5.2 mmol/L   Chloride 101 96 - 106 mmol/L   CO2 29 20 - 29 mmol/L   Calcium 9.9 8.7 - 10.3 mg/dL   Total Protein 7.2 6.0 - 8.5 g/dL   Albumin 4.6 3.8 - 4.8 g/dL   Globulin, Total 2.6 1.5 - 4.5 g/dL   Albumin/Globulin Ratio 1.8 1.2 - 2.2   Bilirubin Total 0.3 0.0 - 1.2 mg/dL   Alkaline Phosphatase 66 44 - 121 IU/L   AST 28 0 - 40 IU/L   ALT 16 0 - 32 IU/L  Lipid Panel w/o Chol/HDL Ratio  Result Value Ref Range   Cholesterol, Total 173 100 - 199 mg/dL   Triglycerides 124 0 - 149 mg/dL   HDL 65 >39 mg/dL   VLDL Cholesterol Cal 22 5 - 40 mg/dL   LDL Chol Calc (NIH) 86 0 - 99 mg/dL      Assessment & Plan:   Problem List Items Addressed This Visit       Other   Hordeolum of right upper eyelid    Ongoing for a month. No pain or erythema. Recommend warm compress a few times per day. Erythromycin ointment ordered. She is to follow up as needed.         Follow up plan: Return if symptoms worsen or fail to improve.

## 2021-09-19 ENCOUNTER — Ambulatory Visit: Payer: Self-pay

## 2021-09-19 ENCOUNTER — Telehealth: Payer: Self-pay

## 2021-09-19 NOTE — Telephone Encounter (Signed)
Patient called and stated that she was having some swelling in her toes for a little while now. Pt states that she is having some pain  in her legs due to varicose veins. Pt was seen in office a few weeks ago and a referral was placed to vascular surgery. Pt has not heard from referral. Please advise pt, [phone].

## 2021-09-19 NOTE — Telephone Encounter (Signed)
Patient called and stated that she was having some swelling in her toes for a little while now. Pt states that she is having some pain  in her legs due to varicose veins. Pt was seen in office a few weeks ago and a referral was placed to vascular surgery. Pt has not heard from referral. Please advise pt, [phone].     Pt. Calling about vein and vascular referral. Given appointment,and phone number, verbalizes understanding.

## 2021-09-23 ENCOUNTER — Other Ambulatory Visit: Payer: Self-pay

## 2021-09-23 ENCOUNTER — Ambulatory Visit: Payer: Medicare Other | Admitting: Anesthesiology

## 2021-09-23 ENCOUNTER — Encounter
Admission: RE | Disposition: A | Discharge status: Home / Self Care | Payer: Self-pay | Source: Ambulatory Visit | Attending: Gastroenterology

## 2021-09-23 ENCOUNTER — Ambulatory Visit
Admission: RE | Admit: 2021-09-23 | Discharge: 2021-09-23 | Disposition: A | Discharge status: Home / Self Care | Payer: Medicare Other | Source: Ambulatory Visit | Attending: Gastroenterology | Admitting: Gastroenterology

## 2021-09-23 ENCOUNTER — Telehealth: Payer: Self-pay

## 2021-09-23 ENCOUNTER — Encounter: Payer: Self-pay | Admitting: Gastroenterology

## 2021-09-23 DIAGNOSIS — I129 Hypertensive chronic kidney disease with stage 1 through stage 4 chronic kidney disease, or unspecified chronic kidney disease: Secondary | ICD-10-CM | POA: Diagnosis not present

## 2021-09-23 DIAGNOSIS — K635 Polyp of colon: Secondary | ICD-10-CM

## 2021-09-23 DIAGNOSIS — G5681 Other specified mononeuropathies of right upper limb: Secondary | ICD-10-CM

## 2021-09-23 DIAGNOSIS — D122 Benign neoplasm of ascending colon: Secondary | ICD-10-CM | POA: Insufficient documentation

## 2021-09-23 DIAGNOSIS — N189 Chronic kidney disease, unspecified: Secondary | ICD-10-CM | POA: Diagnosis not present

## 2021-09-23 DIAGNOSIS — N183 Chronic kidney disease, stage 3 unspecified: Secondary | ICD-10-CM | POA: Insufficient documentation

## 2021-09-23 DIAGNOSIS — Z800 Family history of malignant neoplasm of digestive organs: Secondary | ICD-10-CM | POA: Diagnosis not present

## 2021-09-23 DIAGNOSIS — Z1211 Encounter for screening for malignant neoplasm of colon: Secondary | ICD-10-CM | POA: Insufficient documentation

## 2021-09-23 DIAGNOSIS — E785 Hyperlipidemia, unspecified: Secondary | ICD-10-CM | POA: Insufficient documentation

## 2021-09-23 DIAGNOSIS — G709 Myoneural disorder, unspecified: Secondary | ICD-10-CM | POA: Diagnosis not present

## 2021-09-23 HISTORY — PX: COLONOSCOPY WITH PROPOFOL: SHX5780

## 2021-09-23 HISTORY — PX: POLYPECTOMY: SHX5525

## 2021-09-23 SURGERY — COLONOSCOPY WITH PROPOFOL
Anesthesia: General | Site: Rectum

## 2021-09-23 MED ORDER — SODIUM CHLORIDE 0.9 % IV SOLN: INTRAVENOUS | Status: DC

## 2021-09-23 MED ORDER — STERILE WATER FOR IRRIGATION IR SOLN
Status: DC | PRN
  Administered 2021-09-23: 1

## 2021-09-23 MED ORDER — PROPOFOL 10 MG/ML IV BOLUS
INTRAVENOUS | Status: DC | PRN
  Administered 2021-09-23: 30 mg via INTRAVENOUS
  Administered 2021-09-23: 20 mg via INTRAVENOUS
  Administered 2021-09-23: 30 mg via INTRAVENOUS
  Administered 2021-09-23: 20 mg via INTRAVENOUS
  Administered 2021-09-23: 30 mg via INTRAVENOUS
  Administered 2021-09-23 (×2): 20 mg via INTRAVENOUS

## 2021-09-23 MED ORDER — LIDOCAINE HCL (CARDIAC) PF 100 MG/5ML IV SOSY
PREFILLED_SYRINGE | INTRAVENOUS | Status: DC | PRN
  Administered 2021-09-23: 40 mg via INTRAVENOUS

## 2021-09-23 MED ORDER — LACTATED RINGERS IV SOLN: INTRAVENOUS | Status: DC

## 2021-09-23 SURGICAL SUPPLY — 26 items
CLIP HMST 235XBRD CATH ROT (MISCELLANEOUS) IMPLANT
CLIP RESOLUTION 360 11X235 (MISCELLANEOUS)
ELECT REM PT RETURN 9FT ADLT (ELECTROSURGICAL)
ELECTRODE REM PT RTRN 9FT ADLT (ELECTROSURGICAL) IMPLANT
FCP ESCP3.2XJMB 240X2.8X (MISCELLANEOUS)
FORCEPS BIOP RAD 4 LRG CAP 4 (CUTTING FORCEPS) IMPLANT
FORCEPS BIOP RJ4 240 W/NDL (MISCELLANEOUS)
FORCEPS ESCP3.2XJMB 240X2.8X (MISCELLANEOUS) IMPLANT
GOWN CVR UNV OPN BCK APRN NK (MISCELLANEOUS) ×2 IMPLANT
GOWN ISOL THUMB LOOP REG UNIV (MISCELLANEOUS) ×4
INJECTOR VARIJECT VIN23 (MISCELLANEOUS) IMPLANT
KIT DEFENDO VALVE AND CONN (KITS) IMPLANT
KIT PRC NS LF DISP ENDO (KITS) ×1 IMPLANT
KIT PROCEDURE OLYMPUS (KITS) ×2
MANIFOLD NEPTUNE II (INSTRUMENTS) ×2 IMPLANT
MARKER SPOT ENDO TATTOO 5ML (MISCELLANEOUS) IMPLANT
PROBE APC STR FIRE (PROBE) IMPLANT
RETRIEVER NET ROTH 2.5X230 LF (MISCELLANEOUS) IMPLANT
SNARE COLD EXACTO (MISCELLANEOUS) ×1 IMPLANT
SNARE SHORT THROW 13M SML OVAL (MISCELLANEOUS) IMPLANT
SNARE SHORT THROW 30M LRG OVAL (MISCELLANEOUS) IMPLANT
SNARE SNG USE RND 15MM (INSTRUMENTS) IMPLANT
SPOT EX ENDOSCOPIC TATTOO (MISCELLANEOUS)
TRAP ETRAP POLY (MISCELLANEOUS) ×1 IMPLANT
VARIJECT INJECTOR VIN23 (MISCELLANEOUS)
WATER STERILE IRR 250ML POUR (IV SOLUTION) ×2 IMPLANT

## 2021-09-23 NOTE — H&P (Signed)
Cephas Darby, MD [address]  [place]  [place], [place] [address]  Main: [phone]  Fax: [phone] Pager: [phone]  Primary Care Physician:  Venita Lick, NP Primary Gastroenterologist:  Dr. Cephas Darby  Pre-Procedure History & Physical: HPI:  Danielle Stephens is a 71 y.o. female is here for an colonoscopy.   Past Medical History:  Diagnosis Date   Anxiety    Chronic kidney disease    STAGE 3   Hypertension    Shoulder pain     Past Surgical History:  Procedure Laterality Date   CLOSED MANIPULATION SHOULDER WITH STERIOD INJECTION Right 08/28/2019   Procedure: MANIPULATION SHOULDER WITH STEROID INJECTION x2;  Surgeon: Leim Fabry, MD;  Location: [facility];  Service: Orthopedics;  Laterality: Right;   COLONOSCOPY     DILATION AND CURETTAGE, DIAGNOSTIC / THERAPEUTIC     REVERSE SHOULDER ARTHROPLASTY Right 05/20/2020   Procedure: Right reverse shoulder arthroplasty, biceps tenodesis;  Surgeon: Leim Fabry, MD;  Location: [facility];  Service: Orthopedics;  Laterality: Right;   rotary cuff Right 03/06/2019   SHOULDER ARTHROSCOPY Right 08/28/2019   Procedure: Right shoulder arthroscopic capsular release, lysis of adhesion,;  Surgeon: Leim Fabry, MD;  Location: [facility];  Service: Orthopedics;  Laterality: Right;    Prior to Admission medications   Medication Sig Start Date End Date Taking? Authorizing Provider  amLODipine (NORVASC) 2.5 MG tablet Take 1 tablet (2.5 mg total) by mouth daily. 07/23/21  Yes Cannady, Jolene T, NP  Cholecalciferol (VITAMIN D) 50 MCG (2000 UT) CAPS Take 2,000 Units by mouth daily. gummie   Yes [provider]  gabapentin (NEURONTIN) 300 MG capsule Take 1 capsule (300 mg total) by mouth at bedtime. 08/21/21 12/19/21 Yes Gillis Santa, MD  lactase (LACTAID) 3000 units tablet Take 1 tablet by mouth 3 (three) times daily with meals.   Yes [provider]  lisinopril (ZESTRIL) 20 MG tablet TAKE 1 TABLET BY MOUTH ONCE DAILY  (NEED VISIT FOR FURTHER REFILLS) 07/23/21  Yes Cannady, Jolene T, NP  Multiple Vitamin (MULTIVITAMIN) capsule Take 1 capsule by mouth daily.   Yes [provider]  ondansetron (ZOFRAN) 4 MG tablet Take 1 tablet (4 mg total) by mouth daily as needed for nausea or vomiting. 08/06/21 08/06/22 Yes Gillis Santa, MD  rosuvastatin (CRESTOR) 10 MG tablet Take 1 tablet (10 mg total) by mouth daily. 07/23/21  Yes Cannady, Jolene T, NP  traMADol (ULTRAM) 50 MG tablet Take 1 tablet (50 mg total) by mouth daily as needed for severe pain. 08/06/21 11/04/21 Yes Gillis Santa, MD    Allergies as of 09/03/2021 - Review Complete 09/03/2021  Allergen Reaction Noted   Lactose intolerance (gi) Diarrhea 07/23/2021   Codeine Rash 03/24/2017   Tramadol Hives 03/01/2019    Family History  Problem Relation Age of Onset   Hypertension Mother    Cancer Mother    Hypertension Father    Heart attack Father    Stroke Sister    Hypertension Sister    Heart attack Brother    Hypertension Sister    Hypertension Sister    Hypertension Sister    Hypertension Sister    Hypertension Sister    Down syndrome Brother    Breast cancer Neg Hx     Social History   Socioeconomic History   Marital status: Married    Spouse name: Not on file   Number of children: 2   Years of education: Not on file   Highest  education level: Not on file  Occupational History   Occupation: retired  Tobacco Use   Smoking status: Never   Smokeless tobacco: Never  Vaping Use   Vaping Use: Never used  Substance and Sexual Activity   Alcohol use: No   Drug use: No   Sexual activity: Yes  Other Topics Concern   Not on file  Social History Narrative   Not on file   Social Determinants of Health   Financial Resource Strain: Low Risk  (10/28/2020)   Overall Financial Resource Strain (CARDIA)    Difficulty of Paying Living Expenses: Not hard at all  Food Insecurity: No Food Insecurity (10/28/2020)   Hunger Vital Sign     Worried About Running Out of Food in the Last Year: Never true    Ran Out of Food in the Last Year: Never true  Transportation Needs: No Transportation Needs (10/28/2020)   PRAPARE - Hydrologist (Medical): No    Lack of Transportation (Non-Medical): No  Physical Activity: Inactive (10/28/2020)   Exercise Vital Sign    Days of Exercise per Week: 0 days    Minutes of Exercise per Session: 0 min  Stress: No Stress Concern Present (10/28/2020)   [place]    Feeling of Stress : Not at all  Social Connections: [place] (04/10/2019)   Social Connection and Isolation Panel [NHANES]    Frequency of Communication with Friends and Family: Three times a week    Frequency of Social Gatherings with Friends and Family: Three times a week    Attends Religious Services: More than 4 times per year    Active Member of Clubs or Organizations: Yes    Attends Archivist Meetings: 1 to 4 times per year    Marital Status: Married  Human resources officer Violence: Not on file    Review of Systems: See HPI, otherwise negative ROS  Physical Exam: BP (!) 123/59   Pulse 72   Temp 97.9 F (36.6 C) (Temporal)   Resp 16   Ht '5\' 2"'$  (1.575 m)   Wt 47.6 kg   SpO2 99%   BMI 19.20 kg/m  General:   Alert,  pleasant and cooperative in NAD Head:  Normocephalic and atraumatic. Neck:  Supple; no masses or thyromegaly. Lungs:  Clear throughout to auscultation.    Heart:  Regular rate and rhythm. Abdomen:  Soft, nontender and nondistended. Normal bowel sounds, without guarding, and without rebound.   Neurologic:  Alert and  oriented x4;  grossly normal neurologically.  Impression/Plan: Danielle Stephens is here for an colonoscopy to be performed for colon cancer screening  Risks, benefits, limitations, and alternatives regarding  endoscopy have been reviewed with the patient.  Questions have been answered.  All  parties agreeable.   Sherri Sear, MD  09/23/2021, 8:17 AM

## 2021-09-23 NOTE — Telephone Encounter (Signed)
CALLED PATIENT NO ANSWER LEFT VOICEMAIL FOR A CALL BACK ? ?

## 2021-09-23 NOTE — Anesthesia Preprocedure Evaluation (Signed)
Anesthesia Evaluation  Patient identified by MRN, date of birth, ID band Patient awake    Reviewed: Allergy & Precautions, NPO status , Patient's Chart, lab work & pertinent test results, reviewed documented beta blocker date and time   History of Anesthesia Complications Negative for: history of anesthetic complications  Airway Mallampati: III  TM Distance: <3 FB Neck ROM: Full  Mouth opening: Limited Mouth Opening  Dental   Pulmonary    breath sounds clear to auscultation       Cardiovascular hypertension, (-) angina(-) DOE  Rhythm:Regular Rate:Normal   HLD   Neuro/Psych Anxiety  Chronic pain  Neuromuscular disease    GI/Hepatic neg GERD  ,  Endo/Other    Renal/GU CRFRenal disease     Musculoskeletal  Raynaud's   Abdominal   Peds  Hematology   Anesthesia Other Findings   Reproductive/Obstetrics                            Anesthesia Physical Anesthesia Plan  ASA: 2  Anesthesia Plan: General   Post-op Pain Management:    Induction: Intravenous  PONV Risk Score and Plan: 3 and Propofol infusion, TIVA and Treatment may vary due to age or medical condition  Airway Management Planned: Natural Airway and Nasal Cannula  Additional Equipment:   Intra-op Plan:   Post-operative Plan:   Informed Consent: I have reviewed the patients History and Physical, chart, labs and discussed the procedure including the risks, benefits and alternatives for the proposed anesthesia with the patient or authorized representative who has indicated his/her understanding and acceptance.       Plan Discussed with: CRNA and Anesthesiologist  Anesthesia Plan Comments:         Anesthesia Quick Evaluation

## 2021-09-23 NOTE — Anesthesia Postprocedure Evaluation (Signed)
Anesthesia Post Note  Patient: Danielle Stephens  Procedure(s) Performed: COLONOSCOPY WITH PROPOFOL (Rectum) POLYPECTOMY (Rectum)     Patient location during evaluation: PACU Anesthesia Type: General Level of consciousness: awake and alert Pain management: pain level controlled Vital Signs Assessment: post-procedure vital signs reviewed and stable Respiratory status: spontaneous breathing, nonlabored ventilation, respiratory function stable and patient connected to nasal cannula oxygen Cardiovascular status: blood pressure returned to baseline and stable Postop Assessment: no apparent nausea or vomiting Anesthetic complications: no   No notable events documented.  Roxy Filler A  Khelani Kops

## 2021-09-23 NOTE — Transfer of Care (Signed)
Immediate Anesthesia Transfer of Care Note  Patient: Danielle Stephens  Procedure(s) Performed: COLONOSCOPY WITH PROPOFOL (Rectum) POLYPECTOMY (Rectum)  Patient Location: PACU  Anesthesia Type: General  Level of Consciousness: awake, alert  and patient cooperative  Airway and Oxygen Therapy: Patient Spontanous Breathing and Patient connected to supplemental oxygen  Post-op Assessment: Post-op Vital signs reviewed, Patient's Cardiovascular Status Stable, Respiratory Function Stable, Patent Airway and No signs of Nausea or vomiting  Post-op Vital Signs: Reviewed and stable  Complications: No notable events documented.

## 2021-09-23 NOTE — Op Note (Signed)
[facility] Patient Name: Danielle Stephens Procedure Date: 09/23/2021 8:45 AM MRN: [id number] Account #: [id number] Date of Birth: 12-26-1950 Admit Type: Outpatient Age: 71 Room: [facility] OR ROOM 01 Gender: Female Note Status: Finalized Instrument Name: [id number] Procedure:             Colonoscopy Indications:           Screening in patient at increased risk: Colorectal                         cancer in mother before age 63 Providers:             Yashica Sterbenz Raeanne Gathers MD, MD Referring MD:          Barbaraann Faster. Ned Card (Referring MD) Medicines:             General Anesthesia Complications:         No immediate complications. Estimated blood loss: None. Procedure:             Pre-Anesthesia Assessment:                        - Prior to the procedure, a History and Physical was                         performed, and patient medications and allergies were                         reviewed. The patient is competent. The risks and                         benefits of the procedure and the sedation options and                         risks were discussed with the patient. All questions                         were answered and informed consent was obtained.                         Patient identification and proposed procedure were                         verified by the physician, the nurse, the                         anesthesiologist, the anesthetist and the technician                         in the pre-procedure area in the procedure room in the                         endoscopy suite. Mental Status Examination: alert and                         oriented. Airway Examination: normal oropharyngeal                         airway and neck mobility. Respiratory Examination:  clear to auscultation. CV Examination: normal.                         Prophylactic Antibiotics: The patient does not require                         prophylactic antibiotics. Prior  Anticoagulants: The                         patient has taken no previous anticoagulant or                         antiplatelet agents. ASA Grade Assessment: II - A                         patient with mild systemic disease. After reviewing                         the risks and benefits, the patient was deemed in                         satisfactory condition to undergo the procedure. The                         anesthesia plan was to use general anesthesia.                         Immediately prior to administration of medications,                         the patient was re-assessed for adequacy to receive                         sedatives. The heart rate, respiratory rate, oxygen                         saturations, blood pressure, adequacy of pulmonary                         ventilation, and response to care were monitored                         throughout the procedure. The physical status of the                         patient was re-assessed after the procedure.                        After obtaining informed consent, the colonoscope was                         passed under direct vision. Throughout the procedure,                         the patient's blood pressure, pulse, and oxygen                         saturations were monitored continuously. The  Colonoscope was introduced through the anus and                         advanced to the the cecum, identified by appendiceal                         orifice and ileocecal valve. The colonoscopy was                         performed without difficulty. The patient tolerated                         the procedure well. The quality of the bowel                         preparation was evaluated using the BBPS [facility] Bowel                         Preparation Scale) with scores of: Right Colon = 3,                         Transverse Colon = 3 and Left Colon = 3 (entire mucosa                         seen well with no  residual staining, small fragments                         of stool or opaque liquid). The total BBPS score                         equals 9. Findings:      The perianal and digital rectal examinations were normal. Pertinent       negatives include normal sphincter tone and no palpable rectal lesions.      Two sessile polyps were found in the ascending colon. The polyps were 3       to 4 mm in size. These polyps were removed with a cold snare. Resection       was complete, but the polyp tissue was only partially retrieved.      The retroflexed view of the distal rectum and anal verge was normal and       showed no anal or rectal abnormalities. Impression:            - Two 3 to 4 mm polyps in the ascending colon, removed                         with a cold snare. Complete resection. Partial                         retrieval.                        - The distal rectum and anal verge are normal on                         retroflexion view. Recommendation:        - Discharge patient to home (with escort).                        -  Resume previous diet today.                        - Continue present medications.                        - Await pathology results.                        - Repeat colonoscopy in 5 years for surveillance. Procedure Code(s):     --- Professional ---                        [phone], Colonoscopy, flexible; with removal of                         tumor(s), polyp(s), or other lesion(s) by snare                         technique Diagnosis Code(s):     --- Professional ---                        K63.5, Polyp of colon                        Z80.0, Family history of malignant neoplasm of                         digestive organs CPT copyright 2019 [employer]. All rights reserved. The codes documented in this report are preliminary and upon coder review may  be revised to meet current compliance requirements. Dr. Ulyess Mort Lin Landsman MD,  MD 09/23/2021 9:28:54 AM This report has been signed electronically. Number of Addenda: 0 Note Initiated On: 09/23/2021 8:45 AM Scope Withdrawal Time: 0 hours 13 minutes 50 seconds  Total Procedure Duration: 0 hours 20 minutes 19 seconds  Estimated Blood Loss:  Estimated blood loss: none.      [facility]

## 2021-09-24 ENCOUNTER — Encounter: Payer: Self-pay | Admitting: Gastroenterology

## 2021-09-25 LAB — SURGICAL PATHOLOGY

## 2021-09-26 ENCOUNTER — Encounter: Payer: Self-pay | Admitting: Gastroenterology

## 2021-10-06 ENCOUNTER — Telehealth: Payer: Self-pay

## 2021-10-06 ENCOUNTER — Encounter: Payer: Self-pay | Admitting: Podiatry

## 2021-10-06 ENCOUNTER — Ambulatory Visit (INDEPENDENT_AMBULATORY_CARE_PROVIDER_SITE_OTHER): Payer: Medicare Other | Admitting: Podiatry

## 2021-10-06 DIAGNOSIS — L600 Ingrowing nail: Secondary | ICD-10-CM | POA: Diagnosis not present

## 2021-10-06 MED ORDER — NEOMYCIN-POLYMYXIN-HC 3.5-10000-1 OT SUSP: OTIC | 0 refills | Status: DC

## 2021-10-06 MED ORDER — NEOMYCIN-POLYMYXIN-HC 1 % OT SOLN: OTIC | 0 refills | Status: DC

## 2021-10-06 NOTE — Telephone Encounter (Signed)
Noted  

## 2021-10-06 NOTE — Patient Instructions (Signed)

## 2021-10-06 NOTE — Progress Notes (Signed)
  Subjective:  Patient ID: Danielle Stephens, female    DOB: Jul 25, 1950,  MRN: [id number]  Chief Complaint  Patient presents with   Nail Problem    "Dr. Sherryle Lis removed an ingrown toenail in February.  It's still hurting, very sore.  I can't wear closed toe shoes."    71 y.o. female presents with the above complaint. History confirmed with patient.  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  Radiographs: Multiple views x-ray of both feet: Adductovarus contracture of the fifth toe bilateral with tailor's bunion Assessment:   1. Ingrowing left great toenail      Plan:  Patient was evaluated and treated and all questions answered.    Ingrown Nail, left -Patient elects to proceed with minor surgery to remove ingrown toenail today. Consent reviewed and signed by patient. -Ingrown nail excised. See procedure note. -Educated on post-procedure care including soaking. Written instructions provided and reviewed. -She will return as needed  Procedure: Excision of Ingrown Toenail Location: Left 1st toe medial nail borders. Anesthesia: Lidocaine 1% plain; 1.5 mL and Marcaine 0.5% plain; 1.5 mL, digital block. Skin Prep: Betadine. Dressing: Silvadene; telfa; dry, sterile, compression dressing. Technique: Following skin prep, the toe was exsanguinated and a tourniquet was secured at the base of the toe. The affected nail border was freed, split with a nail splitter, and excised. Chemical matrixectomy was then performed with phenol and irrigated out with alcohol. The tourniquet was then removed and sterile dressing  applied. Disposition: Patient tolerated procedure well.    -WBAT in surgical shoes we will dispense at the surgical center     Return if symptoms worsen or fail to improve.

## 2021-10-06 NOTE — Addendum Note (Signed)
Addended bySherryle Lis, Mona Ayars R on: 10/06/2021 02:54 PM   Modules accepted: Orders

## 2021-10-07 DIAGNOSIS — I872 Venous insufficiency (chronic) (peripheral): Secondary | ICD-10-CM | POA: Insufficient documentation

## 2021-10-07 NOTE — Progress Notes (Deleted)
MRN : [id number]  Danielle Stephens is a 71 y.o. (06-Jan-1951) female who presents with chief complaint of legs hurt and swell.  History of Present Illness:   Patient is seen for evaluation of leg swelling. The patient first noticed the swelling remotely but is now concerned because of a significant increase in the overall edema. The swelling isn't associated with significant pain.  There has been an increasing amount of  discoloration noted by the patient. The patient notes that in the morning the legs are improved but they steadily worsened throughout the course of the day. Elevation seems to make the swelling of the legs better, dependency makes them much worse.   There is no history of ulcerations associated with the swelling.   The patient denies any recent changes in their medications.  The patient has not been wearing graduated compression.  The patient has no had any past angiography, interventions or vascular surgery.  The patient denies a history of DVT or PE. There is no prior history of phlebitis. There is no history of primary lymphedema.  There is no history of radiation treatment to the groin or pelvis No history of malignancies. No history of trauma or groin or pelvic surgery. No history of foreign travel or parasitic infections area    No outpatient medications have been marked as taking for the 10/09/21 encounter (Appointment) with Delana Meyer, Dolores Lory, MD.    Past Medical History:  Diagnosis Date   Anxiety    Chronic kidney disease    STAGE 3   Hypertension    Shoulder pain     Past Surgical History:  Procedure Laterality Date   CLOSED MANIPULATION SHOULDER WITH STERIOD INJECTION Right 08/28/2019   Procedure: MANIPULATION SHOULDER WITH STEROID INJECTION x2;  Surgeon: Leim Fabry, MD;  Location: [facility];  Service: Orthopedics;  Laterality: Right;   COLONOSCOPY     COLONOSCOPY WITH PROPOFOL N/A 09/23/2021   Procedure: COLONOSCOPY WITH PROPOFOL;  Surgeon:  Lin Landsman, MD;  Location: [place];  Service: Endoscopy;  Laterality: N/A;  ONE ASCENDING COLON POLYP NOT RETRIEVED   DILATION AND CURETTAGE, DIAGNOSTIC / THERAPEUTIC     POLYPECTOMY N/A 09/23/2021   Procedure: POLYPECTOMY;  Surgeon: Lin Landsman, MD;  Location: [place];  Service: Endoscopy;  Laterality: N/A;   REVERSE SHOULDER ARTHROPLASTY Right 05/20/2020   Procedure: Right reverse shoulder arthroplasty, biceps tenodesis;  Surgeon: Leim Fabry, MD;  Location: [facility];  Service: Orthopedics;  Laterality: Right;   rotary cuff Right 03/06/2019   SHOULDER ARTHROSCOPY Right 08/28/2019   Procedure: Right shoulder arthroscopic capsular release, lysis of adhesion,;  Surgeon: Leim Fabry, MD;  Location: [facility];  Service: Orthopedics;  Laterality: Right;    Social History Social History   Tobacco Use   Smoking status: Never   Smokeless tobacco: Never  Vaping Use   Vaping Use: Never used  Substance Use Topics   Alcohol use: No   Drug use: No    Family History Family History  Problem Relation Age of Onset   Hypertension Mother    Cancer Mother    Hypertension Father    Heart attack Father    Stroke Sister    Hypertension Sister    Heart attack Brother    Hypertension Sister    Hypertension Sister    Hypertension Sister    Hypertension Sister    Hypertension Sister    Down syndrome Brother  Breast cancer Neg Hx     Allergies  Allergen Reactions   Lactose Intolerance (Gi) Diarrhea   Codeine Rash     REVIEW OF SYSTEMS (Negative unless checked)  Constitutional: '[]'$ Weight loss  '[]'$ Fever  '[]'$ Chills Cardiac: '[]'$ Chest pain   '[]'$ Chest pressure   '[]'$ Palpitations   '[]'$ Shortness of breath when laying flat   '[]'$ Shortness of breath with exertion. Vascular:  '[]'$ Pain in legs with walking   '[x]'$ Pain in legs at rest  '[]'$ History of DVT   '[]'$ Phlebitis   '[x]'$ Swelling in legs   '[]'$ Varicose veins   '[]'$ Non-healing ulcers Pulmonary:   '[]'$ Uses home oxygen   '[]'$ Productive  cough   '[]'$ Hemoptysis   '[]'$ Wheeze  '[]'$ COPD   '[]'$ Asthma Neurologic:  '[]'$ Dizziness   '[]'$ Seizures   '[]'$ History of stroke   '[]'$ History of TIA  '[]'$ Aphasia   '[]'$ Vissual changes   '[]'$ Weakness or numbness in arm   '[]'$ Weakness or numbness in leg Musculoskeletal:   '[]'$ Joint swelling   '[]'$ Joint pain   '[]'$ Low back pain Hematologic:  '[]'$ Easy bruising  '[]'$ Easy bleeding   '[]'$ Hypercoagulable state   '[]'$ Anemic Gastrointestinal:  '[]'$ Diarrhea   '[]'$ Vomiting  '[]'$ Gastroesophageal reflux/heartburn   '[]'$ Difficulty swallowing. Genitourinary:  '[]'$ Chronic kidney disease   '[]'$ Difficult urination  '[]'$ Frequent urination   '[]'$ Blood in urine Skin:  '[]'$ Rashes   '[]'$ Ulcers  Psychological:  '[]'$ History of anxiety   '[]'$  History of major depression.  Physical Examination  There were no vitals filed for this visit. There is no height or weight on file to calculate BMI. Gen: WD/WN, NAD Head: [place]/AT, No temporalis wasting.  Ear/Nose/Throat: Hearing grossly intact, nares w/o erythema or drainage, pinna without lesions Eyes: PER, EOMI, sclera nonicteric.  Neck: Supple, no gross masses.  No JVD.  Pulmonary:  Good air movement, no audible wheezing, no use of accessory muscles.  Cardiac: RRR, precordium not hyperdynamic. Vascular:  scattered varicosities present bilaterally.  Moderate venous stasis changes to the legs bilaterally.  2+ soft pitting edema  Vessel Right Left  Radial Palpable Palpable  Gastrointestinal: soft, non-distended. No guarding/no peritoneal signs.  Musculoskeletal: M/S 5/5 throughout.  No deformity.  Neurologic: CN 2-12 intact. Pain and light touch intact in extremities.  Symmetrical.  Speech is fluent. Motor exam as listed above. Psychiatric: Judgment intact, Mood & affect appropriate for pt's clinical situation. Dermatologic: Venous rashes no ulcers noted.  No changes consistent with cellulitis. Lymph : No lichenification or skin changes of chronic lymphedema.  CBC Lab Results  Component Value Date   WBC 5.1 05/06/2021   HGB 12.4  05/06/2021   HCT 38.4 05/06/2021   MCV 84 05/06/2021   PLT 221 05/06/2021    BMET    Component Value Date/Time   NA 142 07/23/2021 1137   K 4.4 07/23/2021 1137   CL 101 07/23/2021 1137   CO2 29 07/23/2021 1137   GLUCOSE 75 07/23/2021 1137   GLUCOSE 75 05/20/2020 0656   BUN 13 07/23/2021 1137   CREATININE 0.85 07/23/2021 1137   CALCIUM 9.9 07/23/2021 1137   GFRNONAA >60 05/16/2020 0928   GFRAA 71 02/16/2020 1438   CrCl cannot be calculated (Patient's most recent lab result is older than the maximum 21 days allowed.).  COAG Lab Results  Component Value Date   INR 1.0 05/16/2020    Radiology No results found.   Assessment/Plan There are no diagnoses linked to this encounter.   Hortencia Pilar, MD  10/07/2021 8:17 AM

## 2021-10-08 ENCOUNTER — Ambulatory Visit
Payer: Medicare Other | Attending: Student in an Organized Health Care Education/Training Program | Admitting: Student in an Organized Health Care Education/Training Program

## 2021-10-08 ENCOUNTER — Encounter: Payer: Self-pay | Admitting: Student in an Organized Health Care Education/Training Program

## 2021-10-08 ENCOUNTER — Ambulatory Visit
Admission: RE | Admit: 2021-10-08 | Discharge: 2021-10-08 | Disposition: A | Discharge status: Home / Self Care | Payer: Medicare Other | Source: Ambulatory Visit | Attending: Student in an Organized Health Care Education/Training Program | Admitting: Student in an Organized Health Care Education/Training Program

## 2021-10-08 VITALS — BP 157/84 | HR 85 | Temp 98.1°F | Resp 18 | Ht 62.0 in | Wt 108.0 lb

## 2021-10-08 DIAGNOSIS — M12811 Other specific arthropathies, not elsewhere classified, right shoulder: Secondary | ICD-10-CM | POA: Diagnosis not present

## 2021-10-08 DIAGNOSIS — G8929 Other chronic pain: Secondary | ICD-10-CM | POA: Diagnosis not present

## 2021-10-08 DIAGNOSIS — M25511 Pain in right shoulder: Secondary | ICD-10-CM | POA: Diagnosis not present

## 2021-10-08 DIAGNOSIS — Z96611 Presence of right artificial shoulder joint: Secondary | ICD-10-CM | POA: Diagnosis not present

## 2021-10-08 DIAGNOSIS — M75101 Unspecified rotator cuff tear or rupture of right shoulder, not specified as traumatic: Secondary | ICD-10-CM | POA: Diagnosis not present

## 2021-10-08 MED ORDER — DEXAMETHASONE SODIUM PHOSPHATE 10 MG/ML IJ SOLN
INTRAMUSCULAR | Status: AC
  Filled 2021-10-08: qty 1

## 2021-10-08 MED ORDER — ROPIVACAINE HCL 2 MG/ML IJ SOLN
4.0000 mL | Freq: Once | INTRAMUSCULAR | Status: AC
Start: 2021-10-08 — End: 2021-10-08
  Administered 2021-10-08: 4 mL via INTRA_ARTICULAR

## 2021-10-08 MED ORDER — DEXAMETHASONE SODIUM PHOSPHATE 10 MG/ML IJ SOLN
10.0000 mg | Freq: Once | INTRAMUSCULAR | Status: AC
  Administered 2021-10-08: 10 mg

## 2021-10-08 MED ORDER — LIDOCAINE HCL 2 % IJ SOLN
20.0000 mL | Freq: Once | INTRAMUSCULAR | Status: AC
  Administered 2021-10-08: 400 mg

## 2021-10-08 MED ORDER — ROPIVACAINE HCL 2 MG/ML IJ SOLN
INTRAMUSCULAR | Status: AC
  Filled 2021-10-08: qty 20

## 2021-10-08 MED ORDER — IOHEXOL 180 MG/ML  SOLN
10.0000 mL | Freq: Once | INTRAMUSCULAR | Status: AC
Start: 2021-10-08 — End: 2021-10-08
  Administered 2021-10-08: 10 mL via INTRA_ARTICULAR

## 2021-10-08 MED ORDER — LIDOCAINE HCL (PF) 2 % IJ SOLN
INTRAMUSCULAR | Status: AC
  Filled 2021-10-08: qty 10

## 2021-10-08 NOTE — Progress Notes (Signed)
PROVIDER NOTE: Interpretation of information contained herein should be left to medically-trained personnel. Specific patient instructions are provided elsewhere under "Patient Instructions" section of medical record. This document was created in part using STT-dictation technology, any transcriptional errors that may result from this process are unintentional.  Patient: Danielle Stephens Type: Established DOB: 1950-07-16 MRN: [id number] PCP: Venita Lick, NP  Service: Procedure DOS: 10/08/2021 Setting: Ambulatory Location: Ambulatory outpatient facility Delivery: Face-to-face Provider: Gillis Santa, MD Specialty: Interventional Pain Management Specialty designation: 09 Location: Outpatient facility Ref. Prov.: Venita Lick, NP    Primary Reason for Visit: Interventional Pain Management Treatment. CC: Shoulder Pain (RIGHT)  Procedure:           Type: Suprascapular nerve block (SSNB) #3  Laterality:  Right Level: Superior to scapular spine, lateral to supraspinatus fossa (Suprascapular notch).  Imaging: Fluoroscopic guidance Anesthesia: Local anesthesia (1-2% Lidocaine) DOS: 10/08/2021  Performed by: Gillis Santa, MD  Purpose: Diagnostic/Therapeutic Indications: Shoulder pain, severe enough to impact quality of life and/or function. 1. Chronic right shoulder pain   2. Right rotator cuff tear arthropathy   3. History of right shoulder replacement    NAS-11 score:   Pre-procedure: 5 /10   Post-procedure: 3 /10     Target: Suprascapular nerve Location: midway between the medial border of the scapula and the acromion as it runs through the suprascapular notch. Region: Suprascapular, posterior shoulder  Approach: Percutaneous  Neuroanatomy: The suprascapular nerve is the lateral branch of the superior trunk of the brachial plexus. It receives nerve fibers that originate in the nerve roots C5 and C6 (and sometimes C4). It is a mixed nerve, meaning that it provides both sensory and  motor supply for the suprascapular region. Function: The main function of this nerve is to provide motor innervation for two muscles, the supraspinatus and infraspinatus muscles. They are part of the rotator cuff muscles. In addition, the suprascapular nerve provides a sensory supply to the joints of the scapula (glenohumeral and acromioclavicular joints). Rationale (medical necessity): procedure needed and proper for the diagnosis and/or treatment of the patient's medical symptoms and needs.  Position / Prep / Materials:  Position: Prone Materials:  Tray: Block Needle(s):  Type: Spinal  Gauge (G): 22  Length: 3.5 in.  Qty: 1 Prep solution: DuraPrep (Iodine Povacrylex [0.7% available iodine] and Isopropyl Alcohol, 74% w/w) Prep Area: Entire posterior shoulder area. From upper spine to shoulder proper (upper arm), and from lateral neck to lower tip of shoulder blade.   Pre-op H&P Assessment:  Danielle Stephens is a 71 y.o. (year old), female patient, seen today for interventional treatment. 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 Danielle Stephens has a current medication list which includes the following prescription(s): amlodipine, vitamin d, gabapentin, lactase, lisinopril, multivitamin, neomycin-polymyxin-hydrocortisone, ondansetron, rosuvastatin, and tramadol. Her primarily concern today is the Shoulder Pain (RIGHT)  Initial Vital Signs:  Pulse/HCG Rate: 85ECG Heart Rate: 82 Temp: 98.1 F (36.7 C) Resp: 16 BP: (!) 151/80 SpO2: 100 %  BMI: Estimated body mass index is 19.75 kg/m as calculated from the following:   Height as of this encounter: '5\' 2"'$  (1.575 m).    Weight as of this encounter: 108 lb (49 kg).  Risk Assessment: Allergies: Reviewed. She is allergic to lactose intolerance (gi)  and codeine.  Allergy Precautions: None required Coagulopathies: Reviewed. None identified.  Blood-thinner therapy: None at this time Active Infection(s): Reviewed. None identified. Danielle Stephens is afebrile  Site Confirmation: Danielle Stephens was asked to confirm the procedure and laterality before marking the site Procedure checklist: Completed Consent: Before the procedure and under the influence of no sedative(s), amnesic(s), or anxiolytics, the patient was informed of the treatment options, risks and possible complications. To fulfill our ethical and legal obligations, as recommended by the American Medical Association's Code of Ethics, I have informed the patient of my clinical impression; the nature and purpose of the treatment or procedure; the risks, benefits, and possible complications of the intervention; the alternatives, including doing nothing; the risk(s) and benefit(s) of the alternative treatment(s) or procedure(s); and the risk(s) and benefit(s) of doing nothing. The patient was provided information about the general risks and possible complications associated with the procedure. These may include, but are not limited to: failure to achieve desired goals, infection, bleeding, organ or nerve damage, allergic reactions, paralysis, and death. In addition, the patient was informed of those risks and complications associated to the procedure, such as failure to decrease pain; infection; bleeding; organ or nerve damage with subsequent damage to sensory, motor, and/or autonomic systems, resulting in permanent pain, numbness, and/or weakness of one or several areas of the body; allergic reactions; (i.e.: anaphylactic reaction); and/or death. Furthermore, the patient was informed of those risks and complications associated with the medications. These include, but are not limited  to: allergic reactions (i.e.: anaphylactic or anaphylactoid reaction(s)); adrenal axis suppression; blood sugar elevation that in diabetics may result in ketoacidosis or comma; water retention that in patients with history of congestive heart failure may result in shortness of breath, pulmonary edema, and decompensation with resultant heart failure; weight gain; swelling or edema; medication-induced neural toxicity; particulate matter embolism and blood vessel occlusion with resultant organ, and/or nervous system infarction; and/or aseptic necrosis of one or more joints. Finally, the patient was informed that Medicine is not an exact science; therefore, there is also the possibility of unforeseen or unpredictable risks and/or possible complications that may result in a catastrophic outcome. The patient indicated having understood very clearly. We have given the patient no guarantees and we have made no promises. Enough time was given to the patient to ask questions, all of which were answered to the patient's satisfaction. Ms. Asebedo has indicated that she wanted to continue with the procedure. Attestation: I, the ordering provider, attest that I have discussed with the patient the benefits, risks, side-effects, alternatives, likelihood of achieving goals, and potential problems during recovery for the procedure that I have provided informed consent. Date  Time: 10/08/2021  9:18 AM  Pre-Procedure Preparation:  Monitoring: As per clinic protocol. Respiration, ETCO2, SpO2, BP, heart rate and rhythm monitor placed and checked for adequate function Safety Precautions: Patient was assessed for positional comfort and pressure points before starting the procedure. Time-out: I initiated and conducted the "Time-out" before starting the procedure, as per protocol. The patient was asked to participate by confirming the accuracy of the "Time Out" information. Verification of the correct person, site, and procedure were  performed and confirmed by me, the nursing staff, and the patient. "Time-out" conducted as per Joint Commission's Universal Protocol (UP.01.01.01). Time: 0954  Description of Procedure:          Procedural Technique Safety Precautions: Aspiration looking for blood return was conducted prior to all injections. At no point did we inject any substances,  as a needle was being advanced. No attempts were made at seeking any paresthesias. Safe injection practices and needle disposal techniques used. Medications properly checked for expiration dates. SDV (single dose vial) medications used. Description of the Procedure: Protocol guidelines were followed. The patient was placed in position over the procedure table. The target area was identified and the area prepped in the usual manner. Skin & deeper tissues infiltrated with local anesthetic. Appropriate amount of time allowed to pass for local anesthetics to take effect. The procedure needles were then advanced to the target area. Proper needle placement secured. Negative aspiration confirmed. Solution injected in intermittent fashion, asking for systemic symptoms every 0.5cc of injectate. The needles were then removed and the area cleansed, making sure to leave some of the prepping solution back to take advantage of its long term bactericidal properties.  Vitals:   10/08/21 0919 10/08/21 0952 10/08/21 0956  BP: (!) 151/80 (!) 147/87 (!) 157/84  Pulse: 85    Resp: '16 18 18  '$ Temp: 98.1 F (36.7 C)    TempSrc: Temporal    SpO2: 100% 100% 100%  Weight: 108 lb (49 kg)    Height: '5\' 2"'$  (1.575 m)       Start Time: 0954 hrs. End Time: 0956 hrs.  6 cc solution made of 5 cc of 0.2% ropivacaine, 1 cc of Decadron 10 mg/cc.  Injected along the right suprascapular nerve after contrast confirmation highlighting the suprascapular notch.  Imaging Guidance (Spinal):          Type of Imaging Technique: Fluoroscopy Guidance (Spinal) Indication(s): Assistance in  needle guidance and placement for procedures requiring needle placement in or near specific anatomical locations not easily accessible without such assistance. Exposure Time: Please see nurses notes. Contrast: Before injecting any contrast, we confirmed that the patient did not have an allergy to iodine, shellfish, or radiological contrast. Once satisfactory needle placement was completed at the desired level, radiological contrast was injected. Contrast injected under live fluoroscopy. No contrast complications. See chart for type and volume of contrast used. Fluoroscopic Guidance: I was personally present during the use of fluoroscopy. "Tunnel Vision Technique" used to obtain the best possible view of the target area. Parallax error corrected before commencing the procedure. "Direction-depth-direction" technique used to introduce the needle under continuous pulsed fluoroscopy. Once target was reached, antero-posterior, oblique, and lateral fluoroscopic projection used confirm needle placement in all planes. Images permanently stored in EMR. Interpretation: I personally interpreted the imaging intraoperatively. Adequate needle placement confirmed in multiple planes. Appropriate spread of contrast into desired area was observed. No evidence of afferent or efferent intravascular uptake. No intrathecal or subarachnoid spread observed. Permanent images saved into the patient's record.  Post-operative Assessment:  Post-procedure Vital Signs:  Pulse/HCG Rate: 8588 Temp: 98.1 F (36.7 C) Resp: 18 BP:  (!) 157/84 SpO2: 100 %  EBL: None  Complications: No immediate post-treatment complications observed by team, or reported by patient.  Note: The patient tolerated the entire procedure well. A repeat set of vitals were taken after the procedure and the patient was kept under observation following institutional policy, for this type of procedure. Post-procedural neurological assessment was performed,  showing return to baseline, prior to discharge. The patient was provided with post-procedure discharge instructions, including a section on how to identify potential problems. Should any problems arise concerning this procedure, the patient was given instructions to immediately contact us, at any time, without hesitation. In any case, we plan to contact the patient by telephone for a  follow-up status report regarding this interventional procedure.  Comments:  No additional relevant information.  Plan of Care   We discussed pulsed radiofrequency ablation as well as peripheral nerve stimulation of the suprascapular nerve for the purpose of long-term benefit.  We will assess her response in 4 weeks.  Orders:  Orders Placed This Encounter  Procedures   DG PA[facility] C-ARM 1-60 MIN NO REPORT    Intraoperative interpretation by procedural physician at [place].    Standing Status:   Standing    Number of Occurrences:   1    Order Specific Question:   Reason for exam:    Answer:   Assistance in needle guidance and placement for procedures requiring needle placement in or near specific anatomical locations not easily accessible without such assistance.   Chronic Opioid Analgesic:   Tramadol 50 mg daily prn severe breakthrough pain    Medications ordered for procedure: Meds ordered this encounter  Medications   iohexol (OMNIPAQUE) 180 MG/ML injection 10 mL    Must be Myelogram-compatible. If not available, you may substitute with a water-soluble, non-ionic, hypoallergenic, myelogram-compatible radiological contrast medium.   lidocaine (XYLOCAINE) 2 % (with pres) injection 400 mg   dexamethasone (DECADRON) injection 10 mg   ropivacaine (PF) 2 mg/mL (0.2%) (NAROPIN) injection 4 mL   Patient had nausea with 1 dose of tramadol.  I will send in a prescription for Zofran as above.  If she continues to have nausea, we will need to find an alternative to tramadol.  Medications  administered: We administered iohexol, lidocaine, dexamethasone, and ropivacaine (PF) 2 mg/mL (0.2%).  See the medical record for exact dosing, route, and time of administration.  Follow-up plan:   Return in about 4 weeks (around 11/05/2021) for Post Procedure Evaluation, virtual.       Right SSNB 01/06/21, 08/06/21 , 10/08/21    Recent Visits Date Type Provider Dept  08/21/21 Office Visit Gillis Santa, MD [facility]  08/06/21 Procedure visit Gillis Santa, MD [facility]  07/29/21 Office Visit Gillis Santa, MD [facility]  Showing recent visits within past 90 days and meeting all other requirements Today's Visits Date Type Provider Dept  10/08/21 Procedure visit Gillis Santa, MD [facility]  Showing today's visits and meeting all other requirements Future Appointments Date Type Provider Dept  10/21/21 Appointment Gillis Santa, MD [facility]  11/18/21 Appointment Gillis Santa, MD [facility]  Showing future appointments within next 90 days and meeting all other requirements  Disposition: Discharge home  Discharge (Date  Time): 10/08/2021; 1005 hrs.   Primary Care Physician: Venita Lick, NP Location: [facility] Note by: Gillis Santa, MD Date: 10/08/2021; Time: 10:31 AM  Disclaimer:  Medicine is not an exact science. The only guarantee in medicine is that nothing is guaranteed. It is important to note that the decision to proceed with this intervention was based on the information collected from the patient. The Data and conclusions were drawn from the patient's questionnaire, the interview, and the physical examination. Because the information was provided in large part by the patient, it cannot be guaranteed that it has not been purposely or unconsciously manipulated. Every effort has been made to obtain as much relevant data as possible for this evaluation. It is important to note  that the conclusions that lead to this procedure are derived in large part from the available data. Always take into account that the treatment will also be  dependent on availability of resources and existing treatment guidelines, considered by other Pain Management Practitioners as being common knowledge and practice, at the time of the intervention. For Medico-Legal purposes, it is also important to point out that variation in procedural techniques and pharmacological choices are the acceptable norm. The indications, contraindications, technique, and results of the above procedure should only be interpreted and judged by a Board-Certified Interventional Pain Specialist with extensive familiarity and expertise in the same exact procedure and technique.

## 2021-10-08 NOTE — Progress Notes (Signed)
Safety precautions to be maintained throughout the outpatient stay will include: orient to surroundings, keep bed in low position, maintain call bell within reach at all times, provide assistance with transfer out of bed and ambulation.  

## 2021-10-08 NOTE — Patient Instructions (Signed)

## 2021-10-09 ENCOUNTER — Encounter (INDEPENDENT_AMBULATORY_CARE_PROVIDER_SITE_OTHER): Payer: Medicare Other

## 2021-10-09 ENCOUNTER — Encounter (INDEPENDENT_AMBULATORY_CARE_PROVIDER_SITE_OTHER): Payer: Medicare Other | Admitting: Vascular Surgery

## 2021-10-09 ENCOUNTER — Telehealth: Payer: Self-pay

## 2021-10-09 NOTE — Telephone Encounter (Signed)
Post procedure phone call.  Patient states she is doing great 

## 2021-10-14 ENCOUNTER — Telehealth: Payer: Self-pay

## 2021-10-14 NOTE — Telephone Encounter (Signed)
Patient verbalized understanding of information given and will contact us if she develops any other problems.

## 2021-10-21 ENCOUNTER — Encounter: Payer: Medicare Other | Admitting: Student in an Organized Health Care Education/Training Program

## 2021-10-28 ENCOUNTER — Encounter: Payer: Self-pay | Admitting: Student in an Organized Health Care Education/Training Program

## 2021-10-28 ENCOUNTER — Ambulatory Visit
Payer: Medicare Other | Attending: Student in an Organized Health Care Education/Training Program | Admitting: Student in an Organized Health Care Education/Training Program

## 2021-10-28 VITALS — BP 122/61 | HR 96 | Temp 98.2°F | Resp 16 | Ht 62.0 in | Wt 106.0 lb

## 2021-10-28 DIAGNOSIS — G5681 Other specified mononeuropathies of right upper limb: Secondary | ICD-10-CM | POA: Diagnosis not present

## 2021-10-28 DIAGNOSIS — G894 Chronic pain syndrome: Secondary | ICD-10-CM | POA: Diagnosis not present

## 2021-10-28 DIAGNOSIS — M25511 Pain in right shoulder: Secondary | ICD-10-CM | POA: Diagnosis not present

## 2021-10-28 DIAGNOSIS — M75101 Unspecified rotator cuff tear or rupture of right shoulder, not specified as traumatic: Secondary | ICD-10-CM | POA: Insufficient documentation

## 2021-10-28 DIAGNOSIS — M12811 Other specific arthropathies, not elsewhere classified, right shoulder: Secondary | ICD-10-CM | POA: Diagnosis not present

## 2021-10-28 DIAGNOSIS — G8929 Other chronic pain: Secondary | ICD-10-CM | POA: Diagnosis not present

## 2021-10-28 MED ORDER — GABAPENTIN 100 MG PO CAPS: 100.0000 mg | ORAL_CAPSULE | Freq: Every evening | ORAL | 2 refills | Status: DC

## 2021-10-28 MED ORDER — HYDROCODONE-ACETAMINOPHEN 5-325 MG PO TABS: 1.0000 | ORAL_TABLET | Freq: Once | ORAL | 0 refills | Status: AC | PRN

## 2021-10-28 NOTE — Progress Notes (Signed)
PROVIDER NOTE: Information contained herein reflects review and annotations entered in association with encounter. Interpretation of such information and data should be left to medically-trained personnel. Information provided to patient can be located elsewhere in the medical record under "Patient Instructions". Document created using STT-dictation technology, any transcriptional errors that may result from process are unintentional.    Patient: Danielle Stephens  Service Category: E/M  Provider: Gillis Santa, MD  DOB: Jul 24, 1950  DOS: 10/28/2021  Referring Provider: Venita Lick, NP  MRN: [id number]  Specialty: Interventional Pain Management  PCP: Venita Lick, NP  Type: Established Patient  Setting: Ambulatory outpatient    Location: Office  Delivery: Face-to-face     HPI  [name], a 71 y.o. year old female, is here today because of her Chronic right shoulder pain [M25.511, G89.29]. Ms. Delillo's primary complain today is Shoulder Pain Last encounter: My last encounter with her was on 10/08/2021. Pertinent problems: Ms. Jaskiewicz has H/O repair of right rotator cuff; Chronic right shoulder pain; History of right shoulder replacement; Chronic pain syndrome; and Suprascapular entrapment neuropathy of right side on their pertinent problem list. Pain Assessment: Severity of Chronic pain is reported as a 6 /10. Location: Shoulder Right/to right elbow. Onset:  . Quality: Aching, Constant, Discomfort. Timing:  . Modifying factor(s): procedure, medication making tired. Vitals:  height is _0  (1.575 m) and weight is 106 lb (48.1 kg). Her temperature is 98.2 F (36.8 C). Her blood pressure is 122/61 and her pulse is 96. Her respiration is 16 and oxygen saturation is 97%.   Reason for encounter: both, medication management and post-procedure evaluation and assessment.   Post-procedure evaluation   Type: Suprascapular nerve block (SSNB) #3  Laterality:  Right Level: Superior to scapular spine, lateral to  supraspinatus fossa (Suprascapular notch).  Imaging: Fluoroscopic guidance Anesthesia: Local anesthesia (1-2% Lidocaine) DOS: 10/08/2021  Performed by: Gillis Santa, MD  Purpose: Diagnostic/Therapeutic Indications: Shoulder pain, severe enough to impact quality of life and/or function. 1. Chronic right shoulder pain   2. Right rotator cuff tear arthropathy   3. History of right shoulder replacement    NAS-11 score:   Pre-procedure: 5 /10   Post-procedure: 3 /10      Effectiveness:  Initial hour after procedure: 100 %  Subsequent 4-6 hours post-procedure: 100 %  Analgesia past initial 6 hours: 100 % (1 week)  Ongoing improvement:  Analgesic:  <20% Function: Back to baseline ROM: Back to baseline   Pharmacotherapy Assessment  Analgesic:  Tramadol 50 mg daily prn severe breakthrough pain    Monitoring: [place] PMP: PDMP reviewed during this encounter.       Pharmacotherapy: No side-effects or adverse reactions reported. Compliance: No problems identified. Effectiveness:  patient states that even 25 mg makes her tired and fatigued, discussed rotation to another medication    UDS:  Summary  Date Value Ref Range Status  12/23/2020 Note  Final    Comment:    ==================================================================== Compliance Drug Analysis, Ur ==================================================================== Test                             Result       Flag       Units  Drug Present and Declared for Prescription Verification   Tramadol                       290  EXPECTED   ng/mg creat   O-Desmethyltramadol            500          EXPECTED   ng/mg creat    Source of tramadol is a prescription medication. O-desmethyltramadol    is an expected metabolite of tramadol.    Acetaminophen                  PRESENT      EXPECTED  Drug Present not Declared for Prescription Verification   Diphenhydramine                PRESENT      UNEXPECTED  Drug Absent but  Declared for Prescription Verification   Oxycodone                      Not Detected UNEXPECTED ng/mg creat   Tizanidine                     Not Detected UNEXPECTED    Tizanidine, as indicated in the declared medication list, is not    always detected even when used as directed.    Methocarbamol                  Not Detected UNEXPECTED   Doxylamine                     Not Detected UNEXPECTED ==================================================================== Test                      Result    Flag   Units      Ref Range   Creatinine              30               mg/dL      >=20 ==================================================================== Declared Medications:  The flagging and interpretation on this report are based on the  following declared medications.  Unexpected results may arise from  inaccuracies in the declared medications.   **Note: The testing scope of this panel includes these medications:   Doxylamine  Methocarbamol (Robaxin)  Oxycodone (Roxicodone)  Tramadol (Ultram)   **Note: The testing scope of this panel does not include small to  moderate amounts of these reported medications:   Acetaminophen (Tylenol)  Tizanidine (Zanaflex)   **Note: The testing scope of this panel does not include the  following reported medications:   Biotin  Fexofenadine (Allegra)  Lisinopril (Zestril)  Melatonin  Meloxicam (Mobic)  Multivitamin  Ondansetron (Zofran)  Vitamin C  Vitamin D ==================================================================== For clinical consultation, please call [phone]. ====================================================================       ROS  Constitutional: Denies any fever or chills Gastrointestinal: No reported hemesis, hematochezia, vomiting, or acute GI distress Musculoskeletal:  right shoulder pain Neurological: No reported episodes of acute onset apraxia, aphasia, dysarthria, agnosia, amnesia, paralysis, loss of  coordination, or loss of consciousness  Medication Review  HYDROcodone-acetaminophen, Vitamin D, amLODipine, gabapentin, lactase, lisinopril, multivitamin, neomycin-polymyxin-hydrocortisone, ondansetron, and rosuvastatin  History Review  Allergy: Ms. Shearer is allergic to lactose intolerance (gi) and codeine. Drug: Ms. Bencomo  reports no history of drug use. Alcohol:  reports no history of alcohol use. Tobacco:  reports that she has never smoked. She has never used smokeless tobacco. Social: Ms. Guardino  reports that she has never smoked. She has never used smokeless tobacco. She reports that she does not drink alcohol and  does not use drugs. Medical:  has a past medical history of Anxiety, Chronic kidney disease, Hypertension, and Shoulder pain. Surgical: Ms. Didonato  has a past surgical history that includes Dilation and curettage, diagnostic / therapeutic; rotary cuff (Right, 03/06/2019); Shoulder arthroscopy (Right, 08/28/2019); Closed manipulation shoulder with steroid injection (Right, 08/28/2019); Colonoscopy; Reverse shoulder arthroplasty (Right, 05/20/2020); Colonoscopy with propofol (N/A, 09/23/2021); and polypectomy (N/A, 09/23/2021). Family: family history includes Cancer in her mother; Down syndrome in her brother; Heart attack in her brother and father; Hypertension in her father, mother, sister, sister, sister, sister, sister, and sister; Stroke in her sister.  Laboratory Chemistry Profile   Renal Lab Results  Component Value Date   BUN 13 07/23/2021   CREATININE 0.85 07/23/2021   BCR 15 07/23/2021   GFRAA 71 02/16/2020   GFRNONAA >60 05/16/2020    Hepatic Lab Results  Component Value Date   AST 28 07/23/2021   ALT 16 07/23/2021   ALBUMIN 4.6 07/23/2021   ALKPHOS 66 07/23/2021    Electrolytes Lab Results  Component Value Date   NA 142 07/23/2021   K 4.4 07/23/2021   CL 101 07/23/2021   CALCIUM 9.9 07/23/2021    Bone Lab Results  Component Value Date   VD25OH 35.8 04/14/2021     Inflammation (CRP: Acute Phase) (ESR: Chronic Phase) Lab Results  Component Value Date   CRP <1 04/14/2021   ESRSEDRATE 8 04/14/2021         Note: Above Lab results reviewed.  Recent Imaging Review  DG PAIN CLINIC C-ARM 1-60 MIN NO REPORT Fluoro was used, but no Radiologist interpretation will be provided.  Please refer to "NOTES" tab for provider progress note. Note: Reviewed        Physical Exam  General appearance: Well nourished, well developed, and well hydrated. In no apparent acute distress Mental status: Alert, oriented x 3 (person, place, & time)       Respiratory: No evidence of acute respiratory distress Eyes: PERLA Vitals: BP 122/61   Pulse 96   Temp 98.2 F (36.8 C)   Resp 16   Ht _0  (1.575 m)   Wt 106 lb (48.1 kg)   SpO2 97%   BMI 19.39 kg/m  BMI: Estimated body mass index is 19.39 kg/m as calculated from the following:   Height as of this encounter: _1  (1.575 m).   Weight as of this encounter: 106 lb (48.1 kg). Ideal: Ideal body weight: 50.1 kg (110 lb 7.2 oz)  Cervical Spine Area Exam  Skin & Axial Inspection: No masses, redness, edema, swelling, or associated skin lesions Alignment: Symmetrical Functional ROM: Pain restricted ROM      Stability: No instability detected Muscle Tone/Strength: Functionally intact. No obvious neuro-muscular anomalies detected. Sensory (Neurological): Referred pain pattern Palpation: No palpable anomalies               Upper Extremity (UE) Exam      Side: Right upper extremity   Side: Left upper extremity  Skin & Extremity Inspection: Skin color, temperature, and hair growth are WNL. No peripheral edema or cyanosis. No masses, redness, swelling, asymmetry, or associated skin lesions. No contractures.   Skin & Extremity Inspection: Skin color, temperature, and hair growth are WNL. No peripheral edema or cyanosis. No masses, redness, swelling, asymmetry, or associated skin lesions. No contractures.  Functional ROM:  Pain restricted ROM           Functional ROM: Unrestricted ROM  Muscle Tone/Strength: Functionally intact. No obvious neuro-muscular anomalies detected.   Muscle Tone/Strength: Functionally intact. No obvious neuro-muscular anomalies detected.  Sensory (Neurological): Neurogenic pain pattern           Sensory (Neurological): Unimpaired          Palpation: No palpable anomalies               Palpation: No palpable anomalies              Provocative Test(s):  Phalen's test: deferred Tinel's test: deferred Apley's scratch test (touch opposite shoulder):  Action 1 (Across chest): Decreased ROM Action 2 (Overhead): Decreased ROM Action 3 (LB reach): Decreased ROM        Assessment   Diagnosis Status  1. Chronic right shoulder pain   2. Right rotator cuff tear arthropathy   3. Suprascapular entrapment neuropathy of right side   4. Chronic pain syndrome    Persistent Persistent Persistent   Updated Problems: Problem  Chronic Pain Syndrome  Suprascapular Entrapment Neuropathy of Right Side    Plan of Care  Problem-specific:  Suprascapular entrapment neuropathy of right side Has responded favorably to her right suprascapular nerve blocks.  We discussed pulsed radiofrequency ablation of the right suprascapular nerve versus Sprint peripheral nerve stimulation of the suprascapular nerve.  Details of each procedure were reviewed with the patient along with associated risks.  She was given resources for Sprint peripheral nerve stimulation.  She is leaving for [place] tomorrow to see her grandchild.  She states that she will think about this in the interim.  Chronic pain syndrome Patient is experiencing cognitive changes and fatigue even with low-dose tramadol 25 mg as needed.  We will attempt rotation to low-dose hydrocodone, 2.5 to 5 mg that she can take only as needed when she has severe pain.  I will also reduce her gabapentin to 100 to 200 mg nightly as she states 300 mg  makes her groggy in the morning.  Ms. Citlaly Camplin Maners has a current medication list which includes the following long-term medication(s): amlodipine, gabapentin, lisinopril, and rosuvastatin.  Pharmacotherapy (Medications Ordered): Meds ordered this encounter  Medications   HYDROcodone-acetaminophen (NORCO/VICODIN) 5-325 MG tablet    Sig: Take 1 tablet by mouth once as needed for severe pain. Must last 30 days.    Dispense:  30 tablet    Refill:  0    Chronic Pain: STOP Act (Not applicable) Fill 1 day early if closed on refill date. Avoid benzodiazepines within 8 hours of opioids   gabapentin (NEURONTIN) 100 MG capsule    Sig: Take 1-2 capsules (100-200 mg total) by mouth at bedtime. Follow written titration schedule    Dispense:  60 capsule    Refill:  2    Fill one day early if pharmacy is closed on scheduled refill date. May substitute for generic if available.   Orders:  No orders of the defined types were placed in this encounter.  Follow-up plan:   Return in about 4 weeks (around 11/25/2021) for Medication Management, in person.     Right SSNB 01/06/21, 08/06/21 , 10/08/21     Recent Visits Date Type Provider Dept  10/08/21 Procedure visit Gillis Santa, MD [facility]  08/21/21 Office Visit Gillis Santa, MD [facility]  08/06/21 Procedure visit Gillis Santa, MD [facility]  Showing recent visits within past 90 days and meeting all other requirements Today's Visits Date Type Provider Dept  10/28/21 Office Visit Gillis Santa,  MD [facility]  Showing today's visits and meeting all other requirements Future Appointments Date Type Provider Dept  11/19/21 Appointment Gillis Santa, MD [facility]  Showing future appointments within next 90 days and meeting all other requirements  I discussed the assessment and treatment plan with the patient. The patient was provided an opportunity to ask questions and all were answered. The  patient agreed with the plan and demonstrated an understanding of the instructions.  Patient advised to call back or seek an in-person evaluation if the symptoms or condition worsens.  Duration of encounter: 78mnutes.  Total time on encounter, as per AMA guidelines included both the face-to-face and non-face-to-face time personally spent by the physician and/or other qualified health care professional(s) on the day of the encounter (includes time in activities that require the physician or other qualified health care professional and does not include time in activities normally performed by clinical staff). Physician's time may include the following activities when performed: preparing to see the patient (eg, review of tests, pre-charting review of records) obtaining and/or reviewing separately obtained history performing a medically appropriate examination and/or evaluation counseling and educating the patient/family/caregiver ordering medications, tests, or procedures referring and communicating with other health care professionals (when not separately reported) documenting clinical information in the electronic or other health record independently interpreting results (not separately reported) and communicating results to the patient/ family/caregiver care coordination (not separately reported)  Note by: BGillis Santa MD Date: 10/28/2021; Time: 11:48 AM

## 2021-10-28 NOTE — Progress Notes (Signed)
Patient did not bring medication with her today.   Safety precautions to be maintained throughout the outpatient stay will include: orient to surroundings, keep bed in low position, maintain call bell within reach at all times, provide assistance with transfer out of bed and ambulation.

## 2021-10-28 NOTE — Assessment & Plan Note (Signed)
Has responded favorably to her right suprascapular nerve blocks.  We discussed pulsed radiofrequency ablation of the right suprascapular nerve versus Sprint peripheral nerve stimulation of the suprascapular nerve.  Details of each procedure were reviewed with the patient along with associated risks.  She was given resources for Sprint peripheral nerve stimulation.  She is leaving for [place] tomorrow to see her grandchild.  She states that she will think about this in the interim.

## 2021-10-28 NOTE — Patient Instructions (Signed)
We discussed suprascapular nerve ablation vs stimulation (wire in place for 60 days). Change from Tramadol to Hydrocodone (start with 1/2 tablet)

## 2021-10-28 NOTE — Assessment & Plan Note (Signed)
Patient is experiencing cognitive changes and fatigue even with low-dose tramadol 25 mg as needed.  We will attempt rotation to low-dose hydrocodone, 2.5 to 5 mg that she can take only as needed when she has severe pain.  I will also reduce her gabapentin to 100 to 200 mg nightly as she states 300 mg makes her groggy in the morning.

## 2021-10-31 ENCOUNTER — Ambulatory Visit: Payer: Medicare HMO

## 2021-11-04 ENCOUNTER — Ambulatory Visit: Payer: Medicare Other

## 2021-11-10 ENCOUNTER — Telehealth: Payer: Self-pay | Admitting: Nurse Practitioner

## 2021-11-10 NOTE — Telephone Encounter (Signed)
Copied from [place] [id number]. Topic: Medicare AWV >> Nov 10, 2021 10:54 AM Josephina Gip wrote: Reason for CRM:  Left message for patient to call back and schedule the Medicare Annual Wellness Visit (AWV) virtually or by telephone.  Last AWV 10/28/20  Please schedule at anytime with [facility] Advisor.  Any questions, please call me at [phone]

## 2021-11-18 ENCOUNTER — Telehealth: Payer: Medicare Other | Admitting: Student in an Organized Health Care Education/Training Program

## 2021-11-19 ENCOUNTER — Ambulatory Visit
Payer: Medicare Other | Attending: Student in an Organized Health Care Education/Training Program | Admitting: Student in an Organized Health Care Education/Training Program

## 2021-11-19 DIAGNOSIS — M25511 Pain in right shoulder: Secondary | ICD-10-CM

## 2021-11-19 DIAGNOSIS — G8929 Other chronic pain: Secondary | ICD-10-CM

## 2021-11-19 NOTE — Progress Notes (Signed)
Reschedule appointment in 4 weeks.

## 2021-11-25 ENCOUNTER — Ambulatory Visit: Payer: Self-pay

## 2021-11-25 NOTE — Telephone Encounter (Signed)
  Chief Complaint: sty Symptoms: small knot on bottom on R eyelid, tenderness Frequency: few days  Pertinent Negatives: NA Disposition: '[]'$ ED /'[]'$ Urgent Care (no appt availability in office) / '[]'$ Appointment(In office/virtual)/ '[]'$  [facility]/ '[]'$ Home Care/ '[]'$ Refused Recommended Disposition /'[]'$ [facility]/ '[]'$  Follow-up with PCP Additional Notes: pt was wanting to know if she could use same ointment that she used when she had knot on same R eye but top eyelid. Pt said she now has spot on bottom eyelid in the left. Pt was telling me about 2 eye rx that she had but was unsure which she should use. I advised her I felt coming in for OV would be best. Scheduled appt for tomorrow at 1340 since pt and husband will be going out of town Thursday.   Reason for Disposition  Longstanding or recurring problems with styes  Answer Assessment - Initial Assessment Questions 1. LOCATION: "Which eye has the sty?" "Upper or lower eyelid?"     R bottom eyelid 2. SIZE: "How big is it?" (Note: standard pencil eraser is 6 mm)     small 3. EYELID: "Is the eyelid swollen?" If Yes, ask: "How much?"     no 4. REDNESS: "Has the redness spread onto the eyelid?"     yes 5. ONSET: "When did you notice the sty?"     Few days 7. PAIN: "Is it painful?" If Yes, ask: "How bad is the pain?"  (Scale 1-10; or mild, moderate, severe)     Tender to touch  Protocols used: Sty-A-AH

## 2021-11-25 NOTE — Patient Instructions (Signed)
Bacterial Conjunctivitis, Adult Bacterial conjunctivitis is an infection of your conjunctiva. This is the clear membrane that covers the white part of your eye and the inner part of your eyelid. This infection can make your eye: Red or pink. Itchy or irritated. This condition spreads easily from person to person (is contagious) and from one eye to the other eye. What are the causes? This condition is caused by germs (bacteria). You may get the infection if you come into close contact with: A person who has the infection. Items that have germs on them (are contaminated), such as face towels, contact lens solution, or eye makeup. What increases the risk? You are more likely to get this condition if: You have contact with people who have the infection. You wear contact lenses. You have a sinus infection. You have had a recent eye injury or surgery. You have a weak body defense system (immune system). You have dry eyes. What are the signs or symptoms?  Thick, yellowish discharge from the eye. Tearing or watery eyes. Itchy eyes. Burning feeling in your eyes. Eye redness. Swollen eyelids. Blurred vision. How is this treated?  Antibiotic eye drops or ointment. Antibiotic medicine taken by mouth. This is used for infections that do not get better with drops or ointment or that last more than 10 days. Cool, wet cloths placed on the eyes. Artificial tears used 2-6 times a day. Follow these instructions at home: Medicines Take or apply your antibiotic medicine as told by your doctor. Do not stop using it even if you start to feel better. Take or apply over-the-counter and prescription medicines only as told by your doctor. Do not touch your eyelid with the eye-drop bottle or the ointment tube. Managing discomfort Wipe any fluid from your eye with a warm, wet washcloth or a cotton ball. Place a clean, cool, wet cloth on your eye. Do this for 10-20 minutes, 3-4 times a day. General  instructions Do not wear contacts until the infection is gone. Wear glasses until your doctor says it is okay to wear contacts again. Do not wear eye makeup until the infection is gone. Throw away old eye makeup. Change or wash your pillowcase every day. Do not share towels or washcloths. Wash your hands often with soap and water for at least 20 seconds and especially before touching your face or eyes. Use paper towels to dry your hands. Do not touch or rub your eyes. Do not drive or use heavy machinery if your vision is blurred. Contact a doctor if: You have a fever. You do not get better after 10 days. Get help right away if: You have a fever and your symptoms get worse all of a sudden. You have very bad pain when you move your eye. Your face: Hurts. Is red. Is swollen. You have sudden loss of vision. Summary Bacterial conjunctivitis is an infection of your conjunctiva. This infection spreads easily from person to person. Wash your hands often with soap and water for at least 20 seconds and especially before touching your face or eyes. Use paper towels to dry your hands. Take or apply your antibiotic medicine as told by your doctor. Contact a doctor if you have a fever or you do not get better after 10 days. This information is not intended to replace advice given to you by your health care provider. Make sure you discuss any questions you have with your health care provider. Document Revised: 06/12/2020 Document Reviewed: 06/12/2020 Elsevier Patient Education    2023 [employer]  

## 2021-11-26 ENCOUNTER — Encounter: Payer: Self-pay | Admitting: Nurse Practitioner

## 2021-11-26 ENCOUNTER — Ambulatory Visit (INDEPENDENT_AMBULATORY_CARE_PROVIDER_SITE_OTHER): Payer: Medicare Other | Admitting: Nurse Practitioner

## 2021-11-26 VITALS — BP 107/62 | HR 70 | Temp 98.1°F | Wt 109.6 lb

## 2021-11-26 DIAGNOSIS — H0289 Other specified disorders of eyelid: Secondary | ICD-10-CM | POA: Diagnosis not present

## 2021-11-26 MED ORDER — ERYTHROMYCIN 5 MG/GM OP OINT: 1.0000 | TOPICAL_OINTMENT | Freq: Every day | OPHTHALMIC | 0 refills | Status: AC

## 2021-11-26 NOTE — Progress Notes (Signed)
BP 107/62   Pulse 70   Temp 98.1 F (36.7 C) (Oral)   Wt 109 lb 9.6 oz (49.7 kg)   SpO2 98%   BMI 20.05 kg/m    Subjective:    Patient ID: Danielle Stephens, female    DOB: 07/09/1950, 71 y.o.   MRN: [id number]  HPI: Danielle Stephens is a 71 y.o. female  Chief Complaint  Patient presents with   Eyelid Problem    Patient is here for a R eyelid problem. Patient says the corner of her R eye is red and very painful. Patient says she first noticed the area of her eye for about 4 weeks. Patient says she was prescribed a medication while in [place], and says she does not feel the medication is helping. Patient says the knot in the top of her eye has went down in size.    Medication Management    Patient says she is have been prescribed Hydrocodone and every time she takes the medication she has an outbreak of hives. At first they use to itch and she would take Benadryl to help with itching. Patient says now she takes a 0.5 tablet and will have hives without the itching and says the medication messes with her stomach as well.    EYE PAIN Started with eye issues when in [place] 4 weeks ago, started Trazidex (Tobramycin). Right eye, had similar issue in June.  Reports she was given Hydrocodone for her chronic shoulder pain by Dr. Holley Raring -- occasionally has hives with this.  Took Benadryl and this helps. Is also taking Gabapentin.   Duration:  weeks Involved eye:  right Onset: sudden Severity: 7/10  Quality: sharp and aching Foreign body sensation:no Visual impairment: no Eye redness: no Discharge: no Crusting or matting of eyelids: no Swelling: yes Photophobia: yes Itching: no Tearing: no Headache: no Floaters: no URI symptoms: no Contact lens use: no Close contacts with similar problems: no Eye trauma: no Aggravating factors: when outside in light Alleviating factors: nothing Status: fluctuating Treatments attempted: as above  Relevant past medical, surgical, family and social  history reviewed and updated as indicated. Interim medical history since our last visit reviewed. Allergies and medications reviewed and updated.  Review of Systems  Constitutional:  Negative for chills and fever.  HENT: Negative.    Eyes:  Negative for photophobia, pain, discharge, redness, itching and visual disturbance.  Respiratory:  Negative for cough and shortness of breath.   Cardiovascular: Negative.   Allergic/Immunologic: Negative.     Per HPI unless specifically indicated above     Objective:    BP 107/62   Pulse 70   Temp 98.1 F (36.7 C) (Oral)   Wt 109 lb 9.6 oz (49.7 kg)   SpO2 98%   BMI 20.05 kg/m   Wt Readings from Last 3 Encounters:  11/26/21 109 lb 9.6 oz (49.7 kg)  10/28/21 106 lb (48.1 kg)  10/08/21 108 lb (49 kg)    Physical Exam Vitals and nursing note reviewed.  Constitutional:      Appearance: Normal appearance. She is well-groomed. She is not ill-appearing or toxic-appearing.  HENT:     Head: Atraumatic.     Right Ear: Hearing normal.     Left Ear: Hearing normal.  Eyes:     General: Lids are normal.        Right eye: No hordeolum.        Left eye: No hordeolum.     Extraocular  Movements: Extraocular movements intact.     Conjunctiva/sclera: Conjunctivae normal.     Right eye: No exudate.    Left eye: No exudate.    Pupils: Pupils are equal, round, and reactive to light.   Cardiovascular:     Rate and Rhythm: Normal rate and regular rhythm.     Heart sounds: Normal heart sounds.  Pulmonary:     Effort: Pulmonary effort is normal. No accessory muscle usage.     Breath sounds: Normal breath sounds.  Lymphadenopathy:     Head:     Right side of head: No submental, submandibular, tonsillar, preauricular or posterior auricular adenopathy.     Left side of head: No submental, submandibular, tonsillar, preauricular or posterior auricular adenopathy.  Skin:    General: Skin is warm.  Neurological:     Mental Status: She is alert.   Psychiatric:        Attention and Perception: Attention normal.        Mood and Affect: Mood normal.        Speech: Speech normal.        Behavior: Behavior is cooperative.    Results for orders placed or performed during the hospital encounter of 09/23/21  Surgical pathology  Result Value Ref Range   SURGICAL PATHOLOGY      SURGICAL PATHOLOGY CASE: [id number] PATIENT: Danielle Stephens Surgical Pathology Report     Specimen Submitted: A. Colon polyp x1, asc; cold snare  Clinical History: Colon cancer screening Z12.11.    DIAGNOSIS: A. COLON POLYP, ASCENDING; COLD SNARE: - TUBULAR ADENOMA. - NEGATIVE FOR HIGH-GRADE DYSPLASIA AND MALIGNANCY.  GROSS DESCRIPTION: A. Labeled: Ascending colon polyp x 1-cold snare Received: Formalin Collection time: 9:06 AM on 09/23/2021 Placed into formalin time: 9:06 AM on 09/23/2021 Tissue fragment(s): 1 Size: 0.5 x 0.4 x 0.1 cm Description: Tan soft tissue fragment Entirely submitted in 1 cassette.  CM 09/24/2021  Final Diagnosis performed by Allena Napoleon, MD.   Electronically signed 09/25/2021 8:23:30AM The electronic signature indicates that the named Attending Pathologist has evaluated the specimen Technical component performed at [facility], [address] [place], [place] [address] Lab: [phone] Dir: Rush Farmer, MD, MMM  Professional compone nt performed at [facility], [facility], [place], [place], [place] [address] Lab: [phone] Dir: Kathi Simpers, MD       Assessment & Plan:   Problem List Items Addressed This Visit   None    Follow up plan: No follow-ups on file.

## 2021-11-26 NOTE — Assessment & Plan Note (Signed)
Ongoing issue for week, small abrasion noted to out corner of right eye.  Switch to erythromycin ointment and referral placed to eye doctor for further evaluation.

## 2021-12-01 ENCOUNTER — Encounter (INDEPENDENT_AMBULATORY_CARE_PROVIDER_SITE_OTHER): Payer: Medicare Other | Admitting: Vascular Surgery

## 2021-12-01 ENCOUNTER — Encounter (INDEPENDENT_AMBULATORY_CARE_PROVIDER_SITE_OTHER): Payer: Medicare Other

## 2021-12-02 ENCOUNTER — Ambulatory Visit (INDEPENDENT_AMBULATORY_CARE_PROVIDER_SITE_OTHER): Payer: Medicare Other

## 2021-12-02 VITALS — Ht 62.0 in | Wt 109.0 lb

## 2021-12-02 DIAGNOSIS — Z0000 Encounter for general adult medical examination without abnormal findings: Secondary | ICD-10-CM | POA: Diagnosis not present

## 2021-12-02 DIAGNOSIS — Z1231 Encounter for screening mammogram for malignant neoplasm of breast: Secondary | ICD-10-CM

## 2021-12-02 NOTE — Progress Notes (Signed)
I connected with  Danielle Stephens on 12/02/21 by a audio enabled telemedicine application and verified that I am speaking with the correct person using two identifiers.  Patient Location: Home  Provider Location: Office/Clinic  I discussed the limitations of evaluation and management by telemedicine. The patient expressed understanding and agreed to proceed.    Subjective:   Danielle Stephens is a 71 y.o. female who presents for Medicare Annual (Subsequent) preventive examination.  Review of Systems    Defer to PCP.     Objective:    Today's Vitals   12/02/21 0953 12/02/21 0954  Weight: 109 lb (49.4 kg)   Height: '5\' 2"'$  (1.575 m)   PainSc:  5    Body mass index is 19.94 kg/m.     09/23/2021    7:57 AM 10/28/2020    2:40 PM 05/20/2020    6:42 AM 05/10/2020    1:36 PM 10/27/2019    2:39 PM 03/06/2019    6:17 AM 03/01/2019   11:58 AM  Advanced Directives  Does Patient Have a Medical Advance Directive? No No No No No No No  Would patient like information on creating a medical advance directive? No - Patient declined  No - Patient declined   No - Patient declined     Current Medications (verified) Outpatient Encounter Medications as of 12/02/2021  Medication Sig   amLODipine (NORVASC) 2.5 MG tablet Take 1 tablet (2.5 mg total) by mouth daily.   Cholecalciferol (VITAMIN D) 50 MCG (2000 UT) CAPS Take 2,000 Units by mouth daily. gummie   erythromycin ophthalmic ointment Place 1 Application into both eyes at bedtime for 7 days.   gabapentin (NEURONTIN) 100 MG capsule Take 1-2 capsules (100-200 mg total) by mouth at bedtime. Follow written titration schedule   lactase (LACTAID) 3000 units tablet Take 1 tablet by mouth 3 (three) times daily with meals.   lisinopril (ZESTRIL) 20 MG tablet TAKE 1 TABLET BY MOUTH ONCE DAILY (NEED VISIT FOR FURTHER REFILLS)   Multiple Vitamin (MULTIVITAMIN) capsule Take 1 capsule by mouth daily.   ondansetron (ZOFRAN) 4 MG tablet Take 1 tablet (4 mg total) by  mouth daily as needed for nausea or vomiting.   rosuvastatin (CRESTOR) 10 MG tablet Take 1 tablet (10 mg total) by mouth daily.   No facility-administered encounter medications on file as of 12/02/2021.    Allergies (verified) Lactose intolerance (gi) and Codeine   History: Past Medical History:  Diagnosis Date   Anxiety    Chronic kidney disease    STAGE 3   Hypertension    Shoulder pain    Past Surgical History:  Procedure Laterality Date   CLOSED MANIPULATION SHOULDER WITH STERIOD INJECTION Right 08/28/2019   Procedure: MANIPULATION SHOULDER WITH STEROID INJECTION x2;  Surgeon: Leim Fabry, MD;  Location: [facility];  Service: Orthopedics;  Laterality: Right;   COLONOSCOPY     COLONOSCOPY WITH PROPOFOL N/A 09/23/2021   Procedure: COLONOSCOPY WITH PROPOFOL;  Surgeon: Lin Landsman, MD;  Location: [facility];  Service: Endoscopy;  Laterality: N/A;  ONE ASCENDING COLON POLYP NOT RETRIEVED   DILATION AND CURETTAGE, DIAGNOSTIC / THERAPEUTIC     POLYPECTOMY N/A 09/23/2021   Procedure: POLYPECTOMY;  Surgeon: Lin Landsman, MD;  Location: [facility];  Service: Endoscopy;  Laterality: N/A;   REVERSE SHOULDER ARTHROPLASTY Right 05/20/2020   Procedure: Right reverse shoulder arthroplasty, biceps tenodesis;  Surgeon: Leim Fabry, MD;  Location: [facility];  Service: Orthopedics;  Laterality: Right;  rotary cuff Right 03/06/2019   SHOULDER ARTHROSCOPY Right 08/28/2019   Procedure: Right shoulder arthroscopic capsular release, lysis of adhesion,;  Surgeon: Leim Fabry, MD;  Location: [facility];  Service: Orthopedics;  Laterality: Right;   Family History  Problem Relation Age of Onset   Hypertension Mother    Cancer Mother    Hypertension Father    Heart attack Father    Stroke Sister    Hypertension Sister    Heart attack Brother    Hypertension Sister    Hypertension Sister    Hypertension Sister    Hypertension Sister    Hypertension Sister    Down  syndrome Brother    Breast cancer Neg Hx    Social History   Socioeconomic History   Marital status: Married    Spouse name: Not on file   Number of children: 2   Years of education: Not on file   Highest education level: Not on file  Occupational History   Occupation: retired  Tobacco Use   Smoking status: Never   Smokeless tobacco: Never  Vaping Use   Vaping Use: Never used  Substance and Sexual Activity   Alcohol use: No   Drug use: No   Sexual activity: Yes  Other Topics Concern   Not on file  Social History Narrative   Not on file   Social Determinants of Health   Financial Resource Strain: Low Risk  (10/28/2020)   Overall Financial Resource Strain (CARDIA)    Difficulty of Paying Living Expenses: Not hard at all  Food Insecurity: No Food Insecurity (12/02/2021)   Hunger Vital Sign    Worried About Running Out of Food in the Last Year: Never true    Ran Out of Food in the Last Year: Never true  Transportation Needs: No Transportation Needs (12/02/2021)   PRAPARE - Hydrologist (Medical): No    Lack of Transportation (Non-Medical): No  Physical Activity: Inactive (10/28/2020)   Exercise Vital Sign    Days of Exercise per Week: 0 days    Minutes of Exercise per Session: 0 min  Stress: No Stress Concern Present (10/28/2020)   [place]    Feeling of Stress : Not at all  Social Connections: Moderately Integrated (12/02/2021)   Social Connection and Isolation Panel [NHANES]    Frequency of Communication with Friends and Family: Twice a week    Frequency of Social Gatherings with Friends and Family: Twice a week    Attends Religious Services: Never    Marine scientist or Organizations: Yes    Attends Music therapist: More than 4 times per year    Marital Status: Married    Tobacco Counseling Counseling given: Not Answered   Clinical  Intake:  Pre-visit preparation completed: Yes  Pain : 0-10 Pain Score: 5  Pain Type: Chronic pain Pain Location: Shoulder Pain Orientation: Right  Nutritional Risks: None Diabetes: No  How often do you need to have someone help you when you read instructions, pamphlets, or other written materials from your doctor or pharmacy?: 1 - Never What is the last grade level you completed in school?: 12th grade  Diabetic- No  Interpreter Needed?: No    Activities of Daily Living    12/02/2021   10:00 AM 09/23/2021    8:02 AM  In your present state of health, do you have any difficulty performing the following activities:  Hearing? 0  0  Vision? 0 0  Difficulty concentrating or making decisions? 0 0  Walking or climbing stairs? 0 0  Dressing or bathing? 0 0  Doing errands, shopping? 0     Patient Care Team: Venita Lick, NP as PCP - General (Nurse Practitioner) Rico Junker, RN as Registered Nurse Theodore Demark, RN (Inactive) as Registered Nurse  Indicate any recent Medical Services you may have received from other than [facility] providers in the past year (date may be approximate).     Assessment:   This is a routine wellness examination for [facility].  Hearing/Vision screen No results found.  Dietary issues and exercise activities discussed:     Goals Addressed   None   Depression Screen    12/02/2021    9:58 AM 11/26/2021    1:36 PM 10/28/2021   10:34 AM 09/01/2021   10:03 AM 07/23/2021   11:02 AM 04/28/2021   11:20 AM 10/28/2020    2:41 PM  PHQ 2/9 Scores  PHQ - 2 Score 0 0 0 0 0 0 0  PHQ- 9 Score 1 0  0 2 3     Fall Risk    12/02/2021   10:00 AM 11/26/2021    1:36 PM 10/08/2021    9:22 AM 09/01/2021   10:03 AM 08/06/2021    1:15 PM  Fall Risk   Falls in the past year? 0 0 0 0 0  Number falls in past yr: 0 0  0 0  Injury with Fall? 0 0  0   Risk for fall due to : No Fall Risks No Fall Risks  No Fall Risks No Fall Risks  Follow up Falls evaluation completed  Falls evaluation completed   Falls evaluation completed    FALL RISK PREVENTION PERTAINING TO THE HOME:  Any stairs in or around the home? Yes  If so, are there any without handrails? No  Home free of loose throw rugs in walkways, pet beds, electrical cords, etc? Yes  Adequate lighting in your home to reduce risk of falls? Yes   ASSISTIVE DEVICES UTILIZED TO PREVENT FALLS:  Life alert? No  Use of a cane, walker or w/c? No  Grab bars in the bathroom? No  Shower chair or bench in shower? No  Elevated toilet seat or a handicapped toilet? No   TIMED UP AND GO:  Was the test performed? No .   Cognitive Function:      12/02/2021   10:01 AM 10/28/2020    2:43 PM 10/27/2019    2:43 PM  6CIT Screen  What Year? 0 points 0 points 0 points  What month? 0 points 0 points 0 points  What time? 0 points 0 points 0 points  Count back from 20 2 points 0 points 0 points  Months in reverse 0 points 2 points 2 points  Repeat phrase 0 points 0 points 0 points  Total Score 2 points 2 points 2 points    Immunizations Immunization History  Administered Date(s) Administered   Fluad Quad(high Dose 65+) 04/14/2021   Influenza-Unspecified 02/24/2019   PFIZER(Purple Top)SARS-COV-2 Vaccination 05/12/2019, 06/07/2019   Pneumococcal Conjugate-13 07/23/2021   Pneumococcal Polysaccharide-23 04/11/2020   Td 09/01/2021   Zoster Recombinat (Shingrix) 04/11/2020    TDAP status: Up to date  Flu Vaccine status: Due, Education has been provided regarding the importance of this vaccine. Advised may receive this vaccine at local pharmacy or Health Dept. Aware to provide a copy of the vaccination record  if obtained from local pharmacy or Health Dept. Verbalized acceptance and understanding.  Pneumococcal vaccine status: Up to date  Covid-19 vaccine status: Information provided on how to obtain vaccines.   Qualifies for Shingles Vaccine? Yes   Zostavax completed Yes   Shingrix Completed?: No.     Education has been provided regarding the importance of this vaccine. Patient has been advised to call insurance company to determine out of pocket expense if they have not yet received this vaccine. Advised may also receive vaccine at local pharmacy or Health Dept. Verbalized acceptance and understanding.  Screening Tests Health Maintenance  Topic Date Due   COVID-19 Vaccine (3 - Pfizer series) 08/02/2019   Zoster Vaccines- Shingrix (2 of 2) 06/06/2020   INFLUENZA VACCINE  06/14/2022 (Originally 10/14/2021)   MAMMOGRAM  12/06/2021   DEXA SCAN  12/06/2024   COLONOSCOPY (Pts 45-59yr Insurance coverage will need to be confirmed)  09/24/2026   TETANUS/TDAP  09/02/2031   Pneumonia Vaccine 71 Years old  Completed   Hepatitis C Screening  Completed   HPV VACCINES  Aged Out    Health Maintenance  Health Maintenance Due  Topic Date Due   COVID-19 Vaccine (3 - Pfizer series) 08/02/2019   Zoster Vaccines- Shingrix (2 of 2) 06/06/2020    Colorectal cancer screening: Type of screening: Colonoscopy. Completed 09/23/21. Repeat every 5 years  Mammogram status: Completed 12/07/19. Repeat every year  Bone Density status: Completed 12/07/19. Results reflect: Bone density results: OSTEOPENIA. Repeat every 5 years.  Lung Cancer Screening: (Low Dose CT Chest recommended if Age 69-80years, 30 pack-year currently smoking OR have quit w/in 15years.) does qualify.   Lung Cancer Screening Referral: N/A  Additional Screening:  Hepatitis C Screening: does qualify; Completed 08/17/19  Vision Screening: Recommended annual ophthalmology exams for early detection of glaucoma and other disorders of the eye. Is the patient up to date with their annual eye exam?  Yes  Who is the provider or what is the name of the office in which the patient attends annual eye exams[facility] is not established with a provider, would they like to be referred to a provider to establish care? No .   Dental  Screening: Recommended annual dental exams for proper oral hygiene  Community Resource Referral / Chronic Care Management: CRR required this visit?  No   CCM required this visit?  No    Plan:     I have personally reviewed and noted the following in the patient's chart:   Medical and social history Use of alcohol, tobacco or illicit drugs  Current medications and supplements including opioid prescriptions. Patient is currently taking opioid prescriptions. Information provided to patient regarding non-opioid alternatives. Patient advised to discuss non-opioid treatment plan with their provider. Functional ability and status Nutritional status Physical activity Advanced directives List of other physicians Hospitalizations, surgeries, and ER visits in previous 12 months Vitals Screenings to include cognitive, depression, and falls Referrals and appointments  In addition, I have reviewed and discussed with patient certain preventive protocols, quality metrics, and best practice recommendations. A written personalized care plan for preventive services as well as general preventive health recommendations were provided to patient.     BGeorgina Peer [place]  12/02/2021   Nurse Notes: Non Face to Face 30 minutes.

## 2021-12-02 NOTE — Patient Instructions (Signed)

## 2021-12-04 ENCOUNTER — Encounter (INDEPENDENT_AMBULATORY_CARE_PROVIDER_SITE_OTHER): Payer: Medicare Other | Admitting: Vascular Surgery

## 2021-12-04 ENCOUNTER — Encounter (INDEPENDENT_AMBULATORY_CARE_PROVIDER_SITE_OTHER): Payer: Medicare Other

## 2021-12-10 DIAGNOSIS — H2513 Age-related nuclear cataract, bilateral: Secondary | ICD-10-CM | POA: Diagnosis not present

## 2021-12-15 ENCOUNTER — Telehealth: Payer: Self-pay | Admitting: Student in an Organized Health Care Education/Training Program

## 2021-12-15 NOTE — Telephone Encounter (Signed)
PT wanted to notify Dr. Holley Raring that she fell on 12-10-21.

## 2021-12-15 NOTE — Telephone Encounter (Signed)
error 

## 2021-12-17 ENCOUNTER — Ambulatory Visit
Admission: RE | Admit: 2021-12-17 | Discharge: 2021-12-17 | Disposition: A | Discharge status: Home / Self Care | Payer: Medicare Other | Source: Ambulatory Visit | Attending: Nurse Practitioner | Admitting: Nurse Practitioner

## 2021-12-17 DIAGNOSIS — Z1231 Encounter for screening mammogram for malignant neoplasm of breast: Secondary | ICD-10-CM | POA: Diagnosis not present

## 2021-12-18 ENCOUNTER — Other Ambulatory Visit: Payer: Self-pay | Admitting: Nurse Practitioner

## 2021-12-18 DIAGNOSIS — R928 Other abnormal and inconclusive findings on diagnostic imaging of breast: Secondary | ICD-10-CM

## 2021-12-18 DIAGNOSIS — R921 Mammographic calcification found on diagnostic imaging of breast: Secondary | ICD-10-CM

## 2021-12-18 NOTE — Progress Notes (Signed)
Contacted via [place] morning Danielle Stephens, your mammogram returned abnormal to right side with need for repeat imaging.  This is common to need further assessment.  Please ensure to schedule. Please call to schedule your mammogram and/or bone density: [facility] [place]: [address], [place], [place] [address] Phone: [phone]  [place] [facility] [address] [name],  [name]  [address] Phone: [phone]

## 2021-12-23 ENCOUNTER — Ambulatory Visit
Admission: RE | Admit: 2021-12-23 | Discharge: 2021-12-23 | Disposition: A | Discharge status: Home / Self Care | Payer: Medicare Other | Source: Ambulatory Visit | Attending: Student in an Organized Health Care Education/Training Program | Admitting: Student in an Organized Health Care Education/Training Program

## 2021-12-23 ENCOUNTER — Ambulatory Visit
Admission: RE | Admit: 2021-12-23 | Discharge: 2021-12-23 | Disposition: A | Discharge status: Home / Self Care | Payer: Medicare Other | Attending: Student in an Organized Health Care Education/Training Program | Admitting: Student in an Organized Health Care Education/Training Program

## 2021-12-23 ENCOUNTER — Ambulatory Visit (HOSPITAL_BASED_OUTPATIENT_CLINIC_OR_DEPARTMENT_OTHER): Payer: Medicare Other | Admitting: Student in an Organized Health Care Education/Training Program

## 2021-12-23 ENCOUNTER — Encounter: Payer: Self-pay | Admitting: Student in an Organized Health Care Education/Training Program

## 2021-12-23 VITALS — BP 131/80 | HR 83 | Temp 98.4°F | Ht 62.0 in | Wt 109.0 lb

## 2021-12-23 DIAGNOSIS — M12811 Other specific arthropathies, not elsewhere classified, right shoulder: Secondary | ICD-10-CM

## 2021-12-23 DIAGNOSIS — W19XXXS Unspecified fall, sequela: Secondary | ICD-10-CM

## 2021-12-23 DIAGNOSIS — M75101 Unspecified rotator cuff tear or rupture of right shoulder, not specified as traumatic: Secondary | ICD-10-CM | POA: Diagnosis not present

## 2021-12-23 DIAGNOSIS — M25512 Pain in left shoulder: Secondary | ICD-10-CM | POA: Insufficient documentation

## 2021-12-23 DIAGNOSIS — M19012 Primary osteoarthritis, left shoulder: Secondary | ICD-10-CM | POA: Diagnosis not present

## 2021-12-23 DIAGNOSIS — G8929 Other chronic pain: Secondary | ICD-10-CM | POA: Insufficient documentation

## 2021-12-23 DIAGNOSIS — G894 Chronic pain syndrome: Secondary | ICD-10-CM | POA: Insufficient documentation

## 2021-12-23 DIAGNOSIS — Z96611 Presence of right artificial shoulder joint: Secondary | ICD-10-CM | POA: Insufficient documentation

## 2021-12-23 DIAGNOSIS — M25511 Pain in right shoulder: Secondary | ICD-10-CM

## 2021-12-23 DIAGNOSIS — M79601 Pain in right arm: Secondary | ICD-10-CM | POA: Insufficient documentation

## 2021-12-23 DIAGNOSIS — M79602 Pain in left arm: Secondary | ICD-10-CM | POA: Insufficient documentation

## 2021-12-23 DIAGNOSIS — Z471 Aftercare following joint replacement surgery: Secondary | ICD-10-CM | POA: Diagnosis not present

## 2021-12-23 DIAGNOSIS — W19XXXA Unspecified fall, initial encounter: Secondary | ICD-10-CM | POA: Insufficient documentation

## 2021-12-23 DIAGNOSIS — S46011S Strain of muscle(s) and tendon(s) of the rotator cuff of right shoulder, sequela: Secondary | ICD-10-CM | POA: Insufficient documentation

## 2021-12-23 MED ORDER — TRAMADOL HCL 50 MG PO TABS: 50.0000 mg | ORAL_TABLET | Freq: Two times a day (BID) | ORAL | 1 refills | Status: DC | PRN

## 2021-12-23 NOTE — Progress Notes (Signed)
PROVIDER NOTE: Information contained herein reflects review and annotations entered in association with encounter. Interpretation of such information and data should be left to medically-trained personnel. Information provided to patient can be located elsewhere in the medical record under "Patient Instructions". Document created using STT-dictation technology, any transcriptional errors that may result from process are unintentional.    Patient: Danielle Stephens  Service Category: E/M  Provider: Gillis Santa, MD  DOB: 11-12-1950  DOS: 12/23/2021  Referring Provider: Venita Lick, NP  MRN: [id number]  Specialty: Interventional Pain Management  PCP: Venita Lick, NP  Type: Established Patient  Setting: Ambulatory outpatient    Location: Office  Delivery: Face-to-face     HPI  [name], a 71 y.o. year old female, is here today because of her Fall, sequela [W19.XXXS]. Ms. Fairburn's primary complain today is Shoulder Pain (Both the right side is the worse) Last encounter: My last encounter with her was on November 19, 2021 Pertinent problems: Ms. Nesler has H/O repair of right rotator cuff; Chronic right shoulder pain; History of right shoulder replacement; Chronic pain syndrome; and Suprascapular entrapment neuropathy of right side on their pertinent problem list. Pain Assessment: Severity of Chronic pain is reported as a 10-Worst pain ever (right shoulder is the worse)/10. Location: Shoulder (right shoulder) Left, Right/pain radiating down her both arm. Onset: More than a month ago. Quality: Aching, Burning, Constant, Tightness, Throbbing, Nagging, Stabbing. Timing: Constant. Modifying factor(s): meds and laying down, ice and heat. Vitals:  height is 5' 2" (1.575 m) and weight is 109 lb (49.4 kg). Her temperature is 98.4 F (36.9 C). Her blood pressure is 131/80 and her pulse is 83. Her oxygen saturation is 100%.   Reason for encounter: follow-up evaluation.   Acute on chronic shoulder pain after  fall.  She states that her right shoulder is much worse.  She is having severe disability with activities of daily living. She states that her acute pain has not improved over the last 3 weeks. She states that she is also experiencing pruritus with hydrocodone.  Recommend going back to tramadol.    Pharmacotherapy Assessment  Analgesic:  Tramadol 50 mg daily prn severe breakthrough pain    Monitoring: [place] PMP: PDMP reviewed during this encounter.       Pharmacotherapy: No side-effects or adverse reactions reported. Compliance: No problems identified. Effectiveness:  patient states that even 25 mg makes her tired and fatigued, discussed rotation to another medication    UDS:  Summary  Date Value Ref Range Status  12/23/2020 Note  Final    Comment:    ==================================================================== Compliance Drug Analysis, Ur ==================================================================== Test                             Result       Flag       Units  Drug Present and Declared for Prescription Verification   Tramadol                       290          EXPECTED   ng/mg creat   O-Desmethyltramadol            500          EXPECTED   ng/mg creat    Source of tramadol is a prescription medication. O-desmethyltramadol    is an expected metabolite of tramadol.    Acetaminophen  PRESENT      EXPECTED  Drug Present not Declared for Prescription Verification   Diphenhydramine                PRESENT      UNEXPECTED  Drug Absent but Declared for Prescription Verification   Oxycodone                      Not Detected UNEXPECTED ng/mg creat   Tizanidine                     Not Detected UNEXPECTED    Tizanidine, as indicated in the declared medication list, is not    always detected even when used as directed.    Methocarbamol                  Not Detected UNEXPECTED   Doxylamine                     Not Detected  UNEXPECTED ==================================================================== Test                      Result    Flag   Units      Ref Range   Creatinine              30               mg/dL      >=20 ==================================================================== Declared Medications:  The flagging and interpretation on this report are based on the  following declared medications.  Unexpected results may arise from  inaccuracies in the declared medications.   **Note: The testing scope of this panel includes these medications:   Doxylamine  Methocarbamol (Robaxin)  Oxycodone (Roxicodone)  Tramadol (Ultram)   **Note: The testing scope of this panel does not include small to  moderate amounts of these reported medications:   Acetaminophen (Tylenol)  Tizanidine (Zanaflex)   **Note: The testing scope of this panel does not include the  following reported medications:   Biotin  Fexofenadine (Allegra)  Lisinopril (Zestril)  Melatonin  Meloxicam (Mobic)  Multivitamin  Ondansetron (Zofran)  Vitamin C  Vitamin D ==================================================================== For clinical consultation, please call [phone]. ====================================================================       ROS  Constitutional: Denies any fever or chills Gastrointestinal: No reported hemesis, hematochezia, vomiting, or acute GI distress Musculoskeletal:  right greater than left shoulder pain Neurological: No reported episodes of acute onset apraxia, aphasia, dysarthria, agnosia, amnesia, paralysis, loss of coordination, or loss of consciousness  Medication Review  HYDROcodone-acetaminophen, Vitamin D, amLODipine, gabapentin, lactase, lisinopril, multivitamin, ondansetron, rosuvastatin, and traMADol  History Review  Allergy: Ms. Bahena is allergic to lactose intolerance (gi) and codeine. Drug: Ms. Seay  reports no history of drug use. Alcohol:  reports no history of  alcohol use. Tobacco:  reports that she has never smoked. She has never used smokeless tobacco. Social: Ms. Racz  reports that she has never smoked. She has never used smokeless tobacco. She reports that she does not drink alcohol and does not use drugs. Medical:  has a past medical history of Anxiety, Chronic kidney disease, Hypertension, and Shoulder pain. Surgical: Ms. Sergent  has a past surgical history that includes Dilation and curettage, diagnostic / therapeutic; rotary cuff (Right, 03/06/2019); Shoulder arthroscopy (Right, 08/28/2019); Closed manipulation shoulder with steroid injection (Right, 08/28/2019); Colonoscopy; Reverse shoulder arthroplasty (Right, 05/20/2020); Colonoscopy with propofol (N/A, 09/23/2021); and polypectomy (N/A, 09/23/2021). Family: family history includes  Cancer in her mother; Down syndrome in her brother; Heart attack in her brother and father; Hypertension in her father, mother, sister, sister, sister, sister, sister, and sister; Stroke in her sister.  Laboratory Chemistry Profile   Renal Lab Results  Component Value Date   BUN 13 07/23/2021   CREATININE 0.85 07/23/2021   BCR 15 07/23/2021   GFRAA 71 02/16/2020   GFRNONAA >60 05/16/2020    Hepatic Lab Results  Component Value Date   AST 28 07/23/2021   ALT 16 07/23/2021   ALBUMIN 4.6 07/23/2021   ALKPHOS 66 07/23/2021    Electrolytes Lab Results  Component Value Date   NA 142 07/23/2021   K 4.4 07/23/2021   CL 101 07/23/2021   CALCIUM 9.9 07/23/2021    Bone Lab Results  Component Value Date   VD25OH 35.8 04/14/2021    Inflammation (CRP: Acute Phase) (ESR: Chronic Phase) Lab Results  Component Value Date   CRP <1 04/14/2021   ESRSEDRATE 8 04/14/2021         Note: Above Lab results reviewed.  Recent Imaging Review  MM 3D SCREEN BREAST BILATERAL CLINICAL DATA:  Screening.  EXAM: DIGITAL SCREENING BILATERAL MAMMOGRAM WITH TOMOSYNTHESIS AND CAD  TECHNIQUE: Bilateral screening digital  craniocaudal and mediolateral oblique mammograms were obtained. Bilateral screening digital breast tomosynthesis was performed. The images were evaluated with computer-aided detection.  COMPARISON:  Previous exam(s).  ACR Breast Density Category c: The breast tissue is heterogeneously dense, which may obscure small masses.  FINDINGS: In the right breast, calcifications warrant further evaluation with magnified views. In the left breast, no findings suspicious for malignancy.  IMPRESSION: Further evaluation is suggested for calcifications in the right breast.  RECOMMENDATION: Diagnostic mammogram of the right breast. (Code:FI-R-6M)  The patient will be contacted regarding the findings, and additional imaging will be scheduled.  BI-RADS CATEGORY  0: Incomplete. Need additional imaging evaluation and/or prior mammograms for comparison.  Electronically Signed   By: Margarette Canada M.D.   On: 12/18/2021 11:01 Note: Reviewed        Physical Exam  General appearance: Well nourished, well developed, and well hydrated. In no apparent acute distress Mental status: Alert, oriented x 3 (person, place, & time)       Respiratory: No evidence of acute respiratory distress Eyes: PERLA Vitals: BP 131/80   Pulse 83   Temp 98.4 F (36.9 C)   Ht 5' 2" (1.575 m)   Wt 109 lb (49.4 kg)   SpO2 100%   BMI 19.94 kg/m  BMI: Estimated body mass index is 19.94 kg/m as calculated from the following:   Height as of this encounter: 5' 2" (1.575 m).   Weight as of this encounter: 109 lb (49.4 kg). Ideal: Ideal body weight: 50.1 kg (110 lb 7.2 oz)  Cervical Spine Area Exam  Skin & Axial Inspection: No masses, redness, edema, swelling, or associated skin lesions Alignment: Symmetrical Functional ROM: Pain restricted ROM      Stability: No instability detected Muscle Tone/Strength: Functionally intact. No obvious neuro-muscular anomalies detected. Sensory (Neurological): Referred pain  pattern Palpation: No palpable anomalies               Upper Extremity (UE) Exam      Side: Right upper extremity   Side: Left upper extremity  Skin & Extremity Inspection: Surgery.   Skin & Extremity Inspection: Skin color, temperature, and hair growth are WNL. No peripheral edema or cyanosis. No masses, redness, swelling, asymmetry, or associated skin lesions.  No contractures.  Functional ROM: Pain restricted ROM           Functional ROM: Pain restricted range of motion       Muscle Tone/Strength: Functionally intact. No obvious neuro-muscular anomalies detected.   Muscle Tone/Strength: Functionally intact. No obvious neuro-muscular anomalies detected.  Sensory (Neurological): Neurogenic pain pattern           Sensory (Neurological): Unimpaired          Palpation: No palpable anomalies               Palpation: No palpable anomalies              Provocative Test(s):  Phalen's test: deferred Tinel's test: deferred Apley's scratch test (touch opposite shoulder):  Action 1 (Across chest): Decreased ROM Action 2 (Overhead): Decreased ROM Action 3 (LB reach): Decreased ROM    Apley's scratch test (touch opposite shoulder):  Action 1 (Across chest): Decreased ROM Action 2 (Overhead): Decreased ROM Action 3 (LB reach): Decreased RO    Assessment   Diagnosis Status  1. Fall, sequela   2. Chronic right shoulder pain   3. Chronic left shoulder pain   4. History of right shoulder replacement   5. Right rotator cuff tear arthropathy   6. Chronic pain syndrome    Persistent Persistent Persistent   Updated Problems: Problem  Fall  Chronic Left Shoulder Pain    Plan of Care    Pharmacotherapy (Medications Ordered): Meds ordered this encounter  Medications   traMADol (ULTRAM) 50 MG tablet    Sig: Take 1 tablet (50 mg total) by mouth every 12 (twelve) hours as needed for severe pain. Must last 30 days    Dispense:  60 tablet    Refill:  1    Chronic Pain: STOP Act (Not applicable)  Fill 1 day early if closed on refill date. Avoid benzodiazepines within 8 hours of opioids   Orders:  Orders Placed This Encounter  Procedures   DG Shoulder Left    Standing Status:   Future    Number of Occurrences:   1    Standing Expiration Date:   01/23/2022    Scheduling Instructions:     Imaging must be done as soon as possible. Inform patient that order will expire within 30 days and I will not renew it.    Order Specific Question:   Reason for Exam (SYMPTOM  OR DIAGNOSIS REQUIRED)    Answer:   Left shoulder pain    Order Specific Question:   Preferred imaging location?    Answer:   [facility]    Order Specific Question:   Call Results- Best Contact Number?    Answer:   [phone] ([facility])    Order Specific Question:   Release to patient    Answer:   Immediate   DG Shoulder Right    Standing Status:   Future    Number of Occurrences:   1    Standing Expiration Date:   01/23/2022    Scheduling Instructions:     Imaging must be done as soon as possible. Inform patient that order will expire within 30 days and I will not renew it.    Order Specific Question:   Reason for Exam (SYMPTOM  OR DIAGNOSIS REQUIRED)    Answer:   Right shoulder pain    Order Specific Question:   Preferred imaging location?    Answer:   [facility]    Order Specific Question:  Call Results- Best Contact Number?    Answer:   [phone] ([facility])    Order Specific Question:   Release to patient    Answer:   Immediate   Follow-up plan:   Return for will call pt with xray results and discuss treatment plan.     Right SSNB 01/06/21, 08/06/21 , 10/08/21     Recent Visits Date Type Provider Dept  10/28/21 Office Visit Gillis Santa, MD [facility]  10/08/21 Procedure visit Gillis Santa, MD [facility]  Showing recent visits within past 90 days and meeting all other requirements Today's Visits Date Type Provider Dept  12/23/21 Office Visit  Gillis Santa, MD [facility]  Showing today's visits and meeting all other requirements Future Appointments No visits were found meeting these conditions. Showing future appointments within next 90 days and meeting all other requirements  I discussed the assessment and treatment plan with the patient. The patient was provided an opportunity to ask questions and all were answered. The patient agreed with the plan and demonstrated an understanding of the instructions.  Patient advised to call back or seek an in-person evaluation if the symptoms or condition worsens.  Duration of encounter: 22mnutes.  Total time on encounter, as per AMA guidelines included both the face-to-face and non-face-to-face time personally spent by the physician and/or other qualified health care professional(s) on the day of the encounter (includes time in activities that require the physician or other qualified health care professional and does not include time in activities normally performed by clinical staff). Physician's time may include the following activities when performed: preparing to see the patient (eg, review of tests, pre-charting review of records) obtaining and/or reviewing separately obtained history performing a medically appropriate examination and/or evaluation counseling and educating the patient/family/caregiver ordering medications, tests, or procedures referring and communicating with other health care professionals (when not separately reported) documenting clinical information in the electronic or other health record independently interpreting results (not separately reported) and communicating results to the patient/ family/caregiver care coordination (not separately reported)  Note by: BGillis Santa MD Date: 12/23/2021; Time: 3:25 PM

## 2021-12-23 NOTE — Progress Notes (Signed)
Nursing Pain Medication Assessment:  Safety precautions to be maintained throughout the outpatient stay will include: orient to surroundings, keep bed in low position, maintain call bell within reach at all times, provide assistance with transfer out of bed and ambulation.  Medication Inspection Compliance: Danielle Stephens did not comply with our request to bring her pills to be counted. She was reminded that bringing the medication bottles, even when empty, is a requirement.  Medication: None brought in. Pill/Patch Count: None available to be counted. Bottle Appearance: No container available. Did not bring bottle(s) to appointment. Filled Date: N/A Last Medication intake:  YesterdaySafety precautions to be maintained throughout the outpatient stay will include: orient to surroundings, keep bed in low position, maintain call bell within reach at all times, provide assistance with transfer out of bed and ambulation.

## 2021-12-29 ENCOUNTER — Telehealth: Payer: Self-pay

## 2021-12-29 NOTE — Telephone Encounter (Signed)
Patient notified of Xray results as per Dr Holley Raring request.  Patient now complaining of both arms hurting.  Notified patient to call  us in 2 weeks if not improved and Dr Holley Raring will order an MRI.  Patient states  understandiog.

## 2021-12-31 ENCOUNTER — Telehealth: Payer: Self-pay | Admitting: Student in an Organized Health Care Education/Training Program

## 2021-12-31 DIAGNOSIS — Z96611 Presence of right artificial shoulder joint: Secondary | ICD-10-CM

## 2021-12-31 DIAGNOSIS — G8929 Other chronic pain: Secondary | ICD-10-CM

## 2021-12-31 DIAGNOSIS — M12811 Other specific arthropathies, not elsewhere classified, right shoulder: Secondary | ICD-10-CM

## 2021-12-31 DIAGNOSIS — G894 Chronic pain syndrome: Secondary | ICD-10-CM

## 2021-12-31 DIAGNOSIS — G5681 Other specified mononeuropathies of right upper limb: Secondary | ICD-10-CM

## 2021-12-31 NOTE — Telephone Encounter (Signed)
PT stated that her right and left shoulder is hurting. Right shoulder hurts the worse. PT stated that she fell over 4 weeks ago. PT stated that she had spoke with a nurse about getting a MRI done. PT stated that she can't sleep cause of the pain that she is in.

## 2021-12-31 NOTE — Telephone Encounter (Signed)
Patient was instructed to call us if she continued to have pain in shoulders. Last phone call indicates that you would order an MRI if needed. She wants to do that.

## 2022-01-06 ENCOUNTER — Ambulatory Visit
Admission: RE | Admit: 2022-01-06 | Discharge: 2022-01-06 | Disposition: A | Discharge status: Home / Self Care | Payer: Medicare Other | Source: Ambulatory Visit | Attending: Nurse Practitioner | Admitting: Nurse Practitioner

## 2022-01-06 DIAGNOSIS — R921 Mammographic calcification found on diagnostic imaging of breast: Secondary | ICD-10-CM | POA: Insufficient documentation

## 2022-01-06 DIAGNOSIS — M25512 Pain in left shoulder: Secondary | ICD-10-CM | POA: Diagnosis not present

## 2022-01-06 DIAGNOSIS — Z96611 Presence of right artificial shoulder joint: Secondary | ICD-10-CM | POA: Diagnosis not present

## 2022-01-06 DIAGNOSIS — G5681 Other specified mononeuropathies of right upper limb: Secondary | ICD-10-CM | POA: Diagnosis not present

## 2022-01-06 DIAGNOSIS — M12811 Other specific arthropathies, not elsewhere classified, right shoulder: Secondary | ICD-10-CM | POA: Insufficient documentation

## 2022-01-06 DIAGNOSIS — M75101 Unspecified rotator cuff tear or rupture of right shoulder, not specified as traumatic: Secondary | ICD-10-CM | POA: Insufficient documentation

## 2022-01-06 DIAGNOSIS — M25511 Pain in right shoulder: Secondary | ICD-10-CM | POA: Insufficient documentation

## 2022-01-06 DIAGNOSIS — R928 Other abnormal and inconclusive findings on diagnostic imaging of breast: Secondary | ICD-10-CM | POA: Insufficient documentation

## 2022-01-06 DIAGNOSIS — G8929 Other chronic pain: Secondary | ICD-10-CM | POA: Insufficient documentation

## 2022-01-06 DIAGNOSIS — G894 Chronic pain syndrome: Secondary | ICD-10-CM | POA: Diagnosis not present

## 2022-01-07 ENCOUNTER — Ambulatory Visit
Admission: RE | Admit: 2022-01-07 | Discharge: 2022-01-07 | Disposition: A | Discharge status: Home / Self Care | Payer: Medicare Other | Source: Ambulatory Visit | Attending: Student in an Organized Health Care Education/Training Program | Admitting: Student in an Organized Health Care Education/Training Program

## 2022-01-07 DIAGNOSIS — G5681 Other specified mononeuropathies of right upper limb: Secondary | ICD-10-CM | POA: Diagnosis not present

## 2022-01-07 DIAGNOSIS — G894 Chronic pain syndrome: Secondary | ICD-10-CM

## 2022-01-07 DIAGNOSIS — M12811 Other specific arthropathies, not elsewhere classified, right shoulder: Secondary | ICD-10-CM

## 2022-01-07 DIAGNOSIS — Z96611 Presence of right artificial shoulder joint: Secondary | ICD-10-CM

## 2022-01-07 DIAGNOSIS — G8929 Other chronic pain: Secondary | ICD-10-CM | POA: Diagnosis not present

## 2022-01-07 DIAGNOSIS — Z471 Aftercare following joint replacement surgery: Secondary | ICD-10-CM | POA: Diagnosis not present

## 2022-01-07 DIAGNOSIS — M7552 Bursitis of left shoulder: Secondary | ICD-10-CM | POA: Diagnosis not present

## 2022-01-07 DIAGNOSIS — M25511 Pain in right shoulder: Secondary | ICD-10-CM | POA: Diagnosis not present

## 2022-01-07 DIAGNOSIS — M75101 Unspecified rotator cuff tear or rupture of right shoulder, not specified as traumatic: Secondary | ICD-10-CM | POA: Diagnosis not present

## 2022-01-07 DIAGNOSIS — R921 Mammographic calcification found on diagnostic imaging of breast: Secondary | ICD-10-CM | POA: Diagnosis not present

## 2022-01-07 DIAGNOSIS — R928 Other abnormal and inconclusive findings on diagnostic imaging of breast: Secondary | ICD-10-CM | POA: Diagnosis not present

## 2022-01-07 DIAGNOSIS — M25512 Pain in left shoulder: Secondary | ICD-10-CM | POA: Diagnosis not present

## 2022-01-07 DIAGNOSIS — M19012 Primary osteoarthritis, left shoulder: Secondary | ICD-10-CM | POA: Diagnosis not present

## 2022-01-12 ENCOUNTER — Encounter (INDEPENDENT_AMBULATORY_CARE_PROVIDER_SITE_OTHER): Payer: Self-pay

## 2022-01-15 ENCOUNTER — Ambulatory Visit
Payer: Medicare Other | Attending: Student in an Organized Health Care Education/Training Program | Admitting: Student in an Organized Health Care Education/Training Program

## 2022-01-15 ENCOUNTER — Encounter: Payer: Self-pay | Admitting: Student in an Organized Health Care Education/Training Program

## 2022-01-15 DIAGNOSIS — Z96611 Presence of right artificial shoulder joint: Secondary | ICD-10-CM | POA: Diagnosis not present

## 2022-01-15 DIAGNOSIS — G8929 Other chronic pain: Secondary | ICD-10-CM | POA: Diagnosis not present

## 2022-01-15 DIAGNOSIS — G894 Chronic pain syndrome: Secondary | ICD-10-CM

## 2022-01-15 DIAGNOSIS — G5681 Other specified mononeuropathies of right upper limb: Secondary | ICD-10-CM | POA: Diagnosis not present

## 2022-01-15 DIAGNOSIS — M12811 Other specific arthropathies, not elsewhere classified, right shoulder: Secondary | ICD-10-CM

## 2022-01-15 DIAGNOSIS — M25511 Pain in right shoulder: Secondary | ICD-10-CM

## 2022-01-15 DIAGNOSIS — M75101 Unspecified rotator cuff tear or rupture of right shoulder, not specified as traumatic: Secondary | ICD-10-CM | POA: Diagnosis not present

## 2022-01-15 NOTE — Progress Notes (Signed)
Patient: Danielle Stephens  Service Category: E/M  Provider: Gillis Santa, MD  DOB: [birth year]  DOS: 01/15/2022  Location: Office  MRN: [id number]  Setting: Ambulatory outpatient  Referring Provider: Venita Lick, NP  Type: Established Patient  Specialty: Interventional Pain Management  PCP: Venita Lick, NP  Location: Remote location  Delivery: TeleHealth     Virtual Encounter - Pain Management PROVIDER NOTE: Information contained herein reflects review and annotations entered in association with encounter. Interpretation of such information and data should be left to medically-trained personnel. Information provided to patient can be located elsewhere in the medical record under "Patient Instructions". Document created using STT-dictation technology, any transcriptional errors that may result from process are unintentional.    Contact & Pharmacy Preferred: [phone] Home: [phone] (home) Mobile: [phone] (mobile) E-mail: [email or web address] [place] [address] [place] [address] Phone: [phone] Fax: [place] [place], [place] [address] [place] [address] Phone: [phone] Fax: [phone]   Pre-screening  Danielle Stephens offered "in-person" vs "virtual" encounter. She indicated preferring virtual for this encounter.   Reason COVID-19*  Social distancing based on CDC and AMA recommendations.   I contacted Danielle Eaves Empie on 01/15/2022 via telephone.      I clearly identified myself as Gillis Santa, MD. I verified that I was speaking with the correct person using two identifiers (Name: Danielle Stephens, and date of birth: [birth year]).  Consent I sought verbal advanced consent from Danielle Stephens for virtual visit interactions. I informed Danielle Stephens of possible security and privacy concerns, risks, and limitations associated with  providing "not-in-person" medical evaluation and management services. I also informed Danielle Stephens of the availability of "in-person" appointments. Finally, I informed her that there would be a charge for the virtual visit and that she could be  personally, fully or partially, financially responsible for it. Danielle Stephens expressed understanding and agreed to proceed.   Historic Elements   Danielle Stephens is a 71 y.o. year old, female patient evaluated today after our last contact on 12/31/2021. Danielle Stephens  has a past medical history of Anxiety, Chronic kidney disease, Hypertension, and Shoulder pain. 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 Danielle Stephens has a current medication list which includes the following prescription(s): amlodipine, vitamin d, gabapentin, hydrocodone-acetaminophen, lactase, lisinopril, multivitamin, ondansetron, rosuvastatin, and tramadol. She  reports that she has never smoked. She has never used smokeless tobacco. She reports that she does not drink alcohol and does not use drugs. Danielle Stephens is allergic to lactose intolerance (gi) and codeine.   HPI  Today, she is being contacted for  to review shoulder MRI results  Pharmacotherapy Assessment   Opioid Analgesic:  Tramadol 50 mg daily prn severe breakthrough pain    Monitoring: [place] PMP: PDMP reviewed during this encounter.       Pharmacotherapy: No side-effects or adverse reactions reported. Compliance: No problems identified. Effectiveness: Clinically acceptable. Plan: Refer to "POC". UDS:  Summary  Date Value Ref Range Status  12/23/2020 Note  Final    Comment:     ==================================================================== Compliance Drug Analysis,  Ur ==================================================================== Test                             Result       Flag       Units  Drug Present and Declared for Prescription Verification   Tramadol                       290          EXPECTED   ng/mg creat   O-Desmethyltramadol            500          EXPECTED   ng/mg creat    Source of tramadol is a prescription medication. O-desmethyltramadol    is an expected metabolite of tramadol.    Acetaminophen                  PRESENT      EXPECTED  Drug Present not Declared for Prescription Verification   Diphenhydramine                PRESENT      UNEXPECTED  Drug Absent but Declared for Prescription Verification   Oxycodone                      Not Detected UNEXPECTED ng/mg creat   Tizanidine                     Not Detected UNEXPECTED    Tizanidine, as indicated in the declared medication list, is not    always detected even when used as directed.    Methocarbamol                  Not Detected UNEXPECTED   Doxylamine                     Not Detected UNEXPECTED ==================================================================== Test                      Result    Flag   Units      Ref Range   Creatinine              30               mg/dL      >=20 ==================================================================== Declared Medications:  The flagging and interpretation on this report are based on the  following declared medications.  Unexpected results may arise from  inaccuracies in the declared medications.   **Note: The testing scope of this panel includes these medications:   Doxylamine  Methocarbamol (Robaxin)  Oxycodone (Roxicodone)  Tramadol (Ultram)   **Note: The testing scope of this panel does not include small to  moderate amounts of these reported medications:   Acetaminophen (Tylenol)  Tizanidine (Zanaflex)    **Note: The testing scope of this panel does not include the  following reported medications:   Biotin  Fexofenadine (Allegra)  Lisinopril (Zestril)  Melatonin  Meloxicam (Mobic)  Multivitamin  Ondansetron (Zofran)  Vitamin C  Vitamin D ==================================================================== For clinical consultation, please call [phone]. ====================================================================    No results found for: "CBDTHCR", "D8THCCBX", "D9THCCBX"   Laboratory Chemistry Profile   Renal Lab Results  Component Value Date   BUN 13 07/23/2021   CREATININE 0.85 07/23/2021   BCR 15 07/23/2021   GFRAA 71 02/16/2020   GFRNONAA >60 05/16/2020  Hepatic Lab Results  Component Value Date   AST 28 07/23/2021   ALT 16 07/23/2021   ALBUMIN 4.6 07/23/2021   ALKPHOS 66 07/23/2021    Electrolytes Lab Results  Component Value Date   NA 142 07/23/2021   K 4.4 07/23/2021   CL 101 07/23/2021   CALCIUM 9.9 07/23/2021    Bone Lab Results  Component Value Date   VD25OH 35.8 04/14/2021    Inflammation (CRP: Acute Phase) (ESR: Chronic Phase) Lab Results  Component Value Date   CRP <1 04/14/2021   ESRSEDRATE 8 04/14/2021         Note: Above Lab results reviewed.  Imaging  MR SHOULDER LEFT WO CONTRAST CLINICAL DATA:  Shoulder pain, chronic, osteoarthritis suspected Shoulder pain, bursitis suspected, xray done  EXAM: MRI OF THE LEFT SHOULDER WITHOUT CONTRAST  TECHNIQUE: Multiplanar, multisequence MR imaging of the shoulder was performed. No intravenous contrast was administered.  COMPARISON:  Left shoulder radiograph 12/23/2021  FINDINGS: Rotator cuff: There is moderate distal supraspinatus and infraspinatus tendinosis with bursal sided fraying. Teres minor tendon is intact. Subscapularis tendon is intact.  Muscles: No significant muscle atrophy.  Biceps Long Head: Intraarticular and extraarticular portions of the biceps tendon  are intact.  Acromioclavicular Joint: Mild arthropathy of the acromioclavicular joint. Mild subacromial/subdeltoid bursal fluid.  Glenohumeral Joint: No significant joint effusion.  Mild chondrosis.  Labrum: No evidence of labral tear.  Bones: No evidence of acute fracture.  No aggressive osseous lesion.  Other: No focal fluid collection.  IMPRESSION: Moderate distal supraspinatus and infraspinatus tendinosis with bursal sided fraying. No high-grade or retracted cuff tear. No significant muscle atrophy.  Mild subacromial-subdeltoid bursitis.  Mild glenohumeral and AC joint osteoarthritis.  Electronically Signed   By: Maurine Simmering M.D.   On: 01/09/2022 16:42 MR SHOULDER RIGHT WO CONTRAST CLINICAL DATA:  Shoulder pain, chronic, osteoarthritis suspected Shoulder pain, bursitis suspected, xray done  EXAM: MRI OF THE RIGHT SHOULDER WITHOUT CONTRAST  TECHNIQUE: Multiplanar, multisequence MR imaging of the shoulder was performed. No intravenous contrast was administered.  COMPARISON:  Radiograph 12/23/2021, MRI 02/13/2020.  FINDINGS: Bones/Joint/Cartilage  Postsurgical changes of right reverse total shoulder arthroplasty with associated susceptibility artifact which distorts adjacent bony and soft tissues. There is no obvious acute osseous abnormality.  Ligaments  Suboptimally assessed by CT.  Muscles and Tendons  There is mild atrophy of the supraspinatus and infraspinatus compatible with history of chronic full-thickness cuff tear.  Soft tissues  There is no focal fluid collection.  IMPRESSION: Postsurgical changes of reverse total shoulder arthroplasty with associated susceptibility artifact, which distorts adjacent bone and soft tissues. Within this limitation, there is no obvious acute osseous abnormality or focal fluid collection. Supraspinatus and infraspinatus atrophy compatible with history of chronic full-thickness cuff tear. If there is concern for  hardware complication, this would be better evaluated with CT.  Electronically Signed   By: Maurine Simmering M.D.   On: 01/09/2022 16:39  Assessment  The primary encounter diagnosis was Chronic right shoulder pain. Diagnoses of History of right shoulder replacement, Right rotator cuff tear arthropathy, Suprascapular entrapment neuropathy of right side, and Chronic pain syndrome were also pertinent to this visit.  Plan of Care  -Referral to orthopedics -PRN right SSNB.  Patient states that her most recent suprascapular nerve block done 10/08/2021 was the most effective.  I have offered her right suprascapular nerve pulsed RFA however I would like for her to see Dr. Marlou Sa with orthopedics to determine if surgery would be warranted.  If surgery is not being considered, I recommend a knee contact our clinic to either repeat a suprascapular nerve block or to further review and discuss pulsed radiofrequency ablation of the right suprascapular nerve.  Another consideration would be Sprint peripheral nerve stimulation of the right suprascapular nerve.   Orders:  Orders Placed This Encounter  Procedures   SUPRASCAPULAR NERVE BLOCK    For shoulder pain.    Standing Status:   Standing    Number of Occurrences:   2    Standing Expiration Date:   07/16/2022    Scheduling Instructions:     Purpose: Diagnostic     Laterality: RIGHT     Level(s): Suprascapular notch     Sedation: PRN     Scheduling Timeframe: As permitted by the schedule    Order Specific Question:   Where will this procedure be performed?    Answer:   [facility]   AMB referral to orthopedics    Referral Priority:   Routine    Referral Type:   Consultation    Referred to Provider:   Meredith Pel, MD    Number of Visits Requested:   1   Follow-up plan:   Return for PRN:  right suprascapular nerve block vs pulsed radiofrequency vs suprascapular nerve stim trial.     Right SSNB 01/06/21, 08/06/21 , 10/08/21      Recent  Visits Date Type Provider Dept  12/23/21 Office Visit Gillis Santa, MD [facility]  10/28/21 Office Visit Gillis Santa, MD [facility]  Showing recent visits within past 90 days and meeting all other requirements Today's Visits Date Type Provider Dept  01/15/22 Office Visit Gillis Santa, MD [facility]  Showing today's visits and meeting all other requirements Future Appointments No visits were found meeting these conditions. Showing future appointments within next 90 days and meeting all other requirements  I discussed the assessment and treatment plan with the patient. The patient was provided an opportunity to ask questions and all were answered. The patient agreed with the plan and demonstrated an understanding of the instructions.  Patient advised to call back or seek an in-person evaluation if the symptoms or condition worsens.  Duration of encounter: 16mnutes.  Note by: BGillis Santa MD Date: 01/15/2022; Time: 3:29 PM

## 2022-01-19 ENCOUNTER — Telehealth: Payer: Self-pay | Admitting: Student in an Organized Health Care Education/Training Program

## 2022-01-19 DIAGNOSIS — M12811 Other specific arthropathies, not elsewhere classified, right shoulder: Secondary | ICD-10-CM

## 2022-01-19 DIAGNOSIS — Z96611 Presence of right artificial shoulder joint: Secondary | ICD-10-CM

## 2022-01-19 DIAGNOSIS — G8929 Other chronic pain: Secondary | ICD-10-CM

## 2022-01-19 MED ORDER — METHOCARBAMOL 500 MG PO TABS: 500.0000 mg | ORAL_TABLET | Freq: Three times a day (TID) | ORAL | 0 refills | Status: DC | PRN

## 2022-01-19 NOTE — Telephone Encounter (Signed)
Patient notified that prescription for Robaxin has been called to pharmacy. Also notified of appt of shoulder injection, pre procedure instructions given.

## 2022-01-19 NOTE — Telephone Encounter (Signed)
Patient had to stop the Tramadol due to side effects, started having severe headaches and stomach aches and loss of appetite. Wants to know if you can send in a muscle relaxer.  She also cannot get in until Nov 22 with orthopedic. Do you suggest she come in for right shoulder injection ?

## 2022-01-21 ENCOUNTER — Encounter: Payer: Self-pay | Admitting: Student in an Organized Health Care Education/Training Program

## 2022-01-21 ENCOUNTER — Ambulatory Visit
Payer: Medicare Other | Attending: Student in an Organized Health Care Education/Training Program | Admitting: Student in an Organized Health Care Education/Training Program

## 2022-01-21 ENCOUNTER — Ambulatory Visit
Admission: RE | Admit: 2022-01-21 | Discharge: 2022-01-21 | Disposition: A | Discharge status: Home / Self Care | Payer: Medicare Other | Source: Ambulatory Visit | Attending: Student in an Organized Health Care Education/Training Program | Admitting: Student in an Organized Health Care Education/Training Program

## 2022-01-21 VITALS — BP 151/97 | HR 70 | Temp 97.5°F | Resp 18 | Ht 62.0 in | Wt 106.0 lb

## 2022-01-21 DIAGNOSIS — M12811 Other specific arthropathies, not elsewhere classified, right shoulder: Secondary | ICD-10-CM | POA: Diagnosis not present

## 2022-01-21 DIAGNOSIS — G5681 Other specified mononeuropathies of right upper limb: Secondary | ICD-10-CM | POA: Insufficient documentation

## 2022-01-21 DIAGNOSIS — M25511 Pain in right shoulder: Secondary | ICD-10-CM | POA: Diagnosis not present

## 2022-01-21 DIAGNOSIS — M75101 Unspecified rotator cuff tear or rupture of right shoulder, not specified as traumatic: Secondary | ICD-10-CM | POA: Insufficient documentation

## 2022-01-21 DIAGNOSIS — Z96611 Presence of right artificial shoulder joint: Secondary | ICD-10-CM | POA: Diagnosis not present

## 2022-01-21 DIAGNOSIS — G894 Chronic pain syndrome: Secondary | ICD-10-CM | POA: Diagnosis not present

## 2022-01-21 DIAGNOSIS — G8929 Other chronic pain: Secondary | ICD-10-CM | POA: Insufficient documentation

## 2022-01-21 MED ORDER — LIDOCAINE HCL 2 % IJ SOLN
20.0000 mL | Freq: Once | INTRAMUSCULAR | Status: AC
  Administered 2022-01-21: 100 mg
  Filled 2022-01-21: qty 40

## 2022-01-21 MED ORDER — IOHEXOL 180 MG/ML  SOLN
10.0000 mL | Freq: Once | INTRAMUSCULAR | Status: AC
  Administered 2022-01-21: 10 mL via INTRA_ARTICULAR
  Filled 2022-01-21: qty 20

## 2022-01-21 MED ORDER — ROPIVACAINE HCL 2 MG/ML IJ SOLN
4.0000 mL | Freq: Once | INTRAMUSCULAR | Status: AC
  Administered 2022-01-21: 4 mL via INTRA_ARTICULAR
  Filled 2022-01-21: qty 20

## 2022-01-21 MED ORDER — DEXAMETHASONE SODIUM PHOSPHATE 10 MG/ML IJ SOLN
10.0000 mg | Freq: Once | INTRAMUSCULAR | Status: AC
  Administered 2022-01-21: 10 mg
  Filled 2022-01-21: qty 1

## 2022-01-21 NOTE — Progress Notes (Signed)
Safety precautions to be maintained throughout the outpatient stay will include: orient to surroundings, keep bed in low position, maintain call bell within reach at all times, provide assistance with transfer out of bed and ambulation.  

## 2022-01-21 NOTE — Progress Notes (Signed)
PROVIDER NOTE: Interpretation of information contained herein should be left to medically-trained personnel. Specific patient instructions are provided elsewhere under "Patient Instructions" section of medical record. This document was created in part using STT-dictation technology, any transcriptional errors that may result from this process are unintentional.  Patient: Danielle Stephens Type: Established DOB: 03-03-1951 MRN: [id number] PCP: Venita Lick, NP  Service: Procedure DOS: 01/21/2022 Setting: Ambulatory Location: Ambulatory outpatient facility Delivery: Face-to-face Provider: Gillis Santa, MD Specialty: Interventional Pain Management Specialty designation: 09 Location: Outpatient facility Ref. Prov.: Venita Lick, NP    Primary Reason for Visit: Interventional Pain Management Treatment. CC: Shoulder Pain (right)  Procedure:           Type: Suprascapular nerve block (SSNB) #4  Laterality:  Right Level: Superior to scapular spine, lateral to supraspinatus fossa (Suprascapular notch).  Imaging: Fluoroscopic guidance Anesthesia: Local anesthesia (1-2% Lidocaine) DOS: 01/21/2022  Performed by: Gillis Santa, MD  Purpose: Diagnostic/Therapeutic Indications: Shoulder pain, severe enough to impact quality of life and/or function. 1. Chronic right shoulder pain   2. History of right shoulder replacement   3. 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 Chronic pain syndrome     NAS-11 score:   Pre-procedure: 9 /10   Post-procedure: 9 /10     Target: Suprascapular nerve Location: midway between the medial border of the scapula and the acromion as it runs through the suprascapular notch. Region: Suprascapular, posterior shoulder  Approach: Percutaneous  Neuroanatomy: The suprascapular nerve is the lateral branch of the superior trunk of the brachial plexus. It receives nerve fibers that originate in the nerve roots C5 and  C6 (and sometimes C4). It is a mixed nerve, meaning that it provides both sensory and motor supply for the suprascapular region. Function: The main function of this nerve is to provide motor innervation for two muscles, the supraspinatus and infraspinatus muscles. They are part of the rotator cuff muscles. In addition, the suprascapular nerve provides a sensory supply to the joints of the scapula (glenohumeral and acromioclavicular joints). Rationale (medical necessity): procedure needed and proper for the diagnosis and/or treatment of the patient's medical symptoms and needs.  Position / Prep / Materials:  Position: Prone Materials:  Tray: Block Needle(s):  Type: Spinal  Gauge (G): 22  Length: 3.5 in.  Qty: 1 Prep solution: DuraPrep (Iodine Povacrylex [0.7% available iodine] and Isopropyl Alcohol, 74% w/w) Prep Area: Entire posterior shoulder area. From upper spine to shoulder proper (upper arm), and from lateral neck to lower tip of shoulder blade.   Pre-op H&P Assessment:  Danielle Stephens is a 71 y.o. (year old), female patient, seen today for interventional treatment. 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 Danielle Stephens has a current medication list which includes the following prescription(s): amlodipine, vitamin d, gabapentin, hydrocodone-acetaminophen, lactase, lisinopril, methocarbamol, multivitamin, ondansetron, rosuvastatin, and tramadol. Her primarily concern today is the Shoulder Pain (right)  Initial Vital Signs:  Pulse/HCG Rate: 70ECG Heart Rate: 90 Temp: (!) 97.5 F (36.4 C) Resp: 16 BP: 138/70 SpO2: 100 %  BMI: Estimated body mass index is 19.39  kg/m as calculated from the following:   Height as of this encounter: '5\' 2"'$  (1.575 m).   Weight as of this  encounter: 106 lb (48.1 kg).  Risk Assessment: Allergies: Reviewed. She is allergic to lactose intolerance (gi) and codeine.  Allergy Precautions: None required Coagulopathies: Reviewed. None identified.  Blood-thinner therapy: None at this time Active Infection(s): Reviewed. None identified. Ms. Scahill is afebrile  Site Confirmation: Ms. Charlot was asked to confirm the procedure and laterality before marking the site Procedure checklist: Completed Consent: Before the procedure and under the influence of no sedative(s), amnesic(s), or anxiolytics, the patient was informed of the treatment options, risks and possible complications. To fulfill our ethical and legal obligations, as recommended by the American Medical Association's Code of Ethics, I have informed the patient of my clinical impression; the nature and purpose of the treatment or procedure; the risks, benefits, and possible complications of the intervention; the alternatives, including doing nothing; the risk(s) and benefit(s) of the alternative treatment(s) or procedure(s); and the risk(s) and benefit(s) of doing nothing. The patient was provided information about the general risks and possible complications associated with the procedure. These may include, but are not limited to: failure to achieve desired goals, infection, bleeding, organ or nerve damage, allergic reactions, paralysis, and death. In addition, the patient was informed of those risks and complications associated to the procedure, such as failure to decrease pain; infection; bleeding; organ or nerve damage with subsequent damage to sensory, motor, and/or autonomic systems, resulting in permanent pain, numbness, and/or weakness of one or several areas of the body; allergic reactions; (i.e.: anaphylactic reaction); and/or death. Furthermore, the patient was informed of those  risks and complications associated with the medications. These include, but are not limited to: allergic reactions (i.e.: anaphylactic or anaphylactoid reaction(s)); adrenal axis suppression; blood sugar elevation that in diabetics may result in ketoacidosis or comma; water retention that in patients with history of congestive heart failure may result in shortness of breath, pulmonary edema, and decompensation with resultant heart failure; weight gain; swelling or edema; medication-induced neural toxicity; particulate matter embolism and blood vessel occlusion with resultant organ, and/or nervous system infarction; and/or aseptic necrosis of one or more joints. Finally, the patient was informed that Medicine is not an exact science; therefore, there is also the possibility of unforeseen or unpredictable risks and/or possible complications that may result in a catastrophic outcome. The patient indicated having understood very clearly. We have given the patient no guarantees and we have made no promises. Enough time was given to the patient to ask questions, all of which were answered to the patient's satisfaction. Ms. Corredor has indicated that she wanted to continue with the procedure. Attestation: I, the ordering provider, attest that I have discussed with the patient the benefits, risks, side-effects, alternatives, likelihood of achieving goals, and potential problems during recovery for the procedure that I have provided informed consent. Date  Time: 01/21/2022  8:46 AM  Pre-Procedure Preparation:  Monitoring: As per clinic protocol. Respiration, ETCO2, SpO2, BP, heart rate and rhythm monitor placed and checked for adequate function Safety Precautions: Patient was assessed for positional comfort and pressure points before starting the procedure. Time-out: I initiated and conducted the "Time-out" before starting the procedure, as per protocol. The patient was asked to participate by confirming the accuracy of  the "Time Out" information. Verification of the correct person, site, and procedure were performed and confirmed by me, the nursing staff, and the patient. "Time-out" conducted as per Joint Commission's Universal Protocol (UP.01.01.01). Time: 0917  Description of Procedure:          Procedural Technique Safety Precautions: Aspiration looking  for blood return was conducted prior to all injections. At no point did we inject any substances, as a needle was being advanced. No attempts were made at seeking any paresthesias. Safe injection practices and needle disposal techniques used. Medications properly checked for expiration dates. SDV (single dose vial) medications used. Description of the Procedure: Protocol guidelines were followed. The patient was placed in position over the procedure table. The target area was identified and the area prepped in the usual manner. Skin & deeper tissues infiltrated with local anesthetic. Appropriate amount of time allowed to pass for local anesthetics to take effect. The procedure needles were then advanced to the target area. Proper needle placement secured. Negative aspiration confirmed. Solution injected in intermittent fashion, asking for systemic symptoms every 0.5cc of injectate. The needles were then removed and the area cleansed, making sure to leave some of the prepping solution back to take advantage of its long term bactericidal properties.  Vitals:   01/21/22 0855 01/21/22 0916 01/21/22 0920  BP: 138/70 (!) 160/99 (!) 151/97  Pulse: 70    Resp: '16 18 18  '$ Temp: (!) 97.5 F (36.4 C)    TempSrc: Temporal    SpO2: 100% 100% 100%  Weight: 106 lb (48.1 kg)    Height: '5\' 2"'$  (1.575 m)        Start Time: 0917 hrs. End Time: 0920 hrs.  6 cc solution made of 5 cc of 0.2% ropivacaine, 1 cc of Decadron 10 mg/cc.  Injected along the right suprascapular nerve after contrast confirmation highlighting the suprascapular notch.  Imaging Guidance (Spinal):           Type of Imaging Technique: Fluoroscopy Guidance (Spinal) Indication(s): Assistance in needle guidance and placement for procedures requiring needle placement in or near specific anatomical locations not easily accessible without such assistance. Exposure Time: Please see nurses notes. Contrast: Before injecting any contrast, we confirmed that the patient did not have an allergy to iodine, shellfish, or radiological contrast. Once satisfactory needle placement was completed at the desired level, radiological contrast was injected. Contrast injected under live fluoroscopy. No contrast complications. See chart for type and volume of contrast used. Fluoroscopic Guidance: I was personally present during the use of fluoroscopy. "Tunnel Vision Technique" used to obtain the best possible view of the target area. Parallax error corrected before commencing the procedure. "Direction-depth-direction" technique used to introduce the needle under continuous pulsed fluoroscopy. Once target was reached, antero-posterior, oblique, and lateral fluoroscopic projection used confirm needle placement in all planes. Images permanently stored in EMR. Interpretation: I personally interpreted the imaging intraoperatively. Adequate needle placement confirmed in multiple planes. Appropriate spread of contrast into desired area was observed. No evidence of afferent or efferent intravascular uptake. No intrathecal or subarachnoid spread observed. Permanent images saved into the patient's record.  Post-operative Assessment:  Post-procedure Vital Signs:  Pulse/HCG Rate: 7079 Temp: (!) 97.5 F (36.4 C) Resp: 18 BP:  (!) 151/97 SpO2: 100 %  EBL: None  Complications: No immediate post-treatment complications observed by team, or reported by patient.  Note: The patient tolerated the entire procedure well. A repeat set of vitals were taken after the procedure and the patient was kept under observation following institutional  policy, for this type of procedure. Post-procedural neurological assessment was performed, showing return to baseline, prior to discharge. The patient was provided with post-procedure discharge instructions, including a section on how to identify potential problems. Should any problems arise concerning this procedure, the patient was given instructions to immediately contact  us, at any time, without hesitation. In any case, we plan to contact the patient by telephone for a follow-up status report regarding this interventional procedure.  Comments:  No additional relevant information.  Plan of Care  Patient has an appointment with Dr. Marlou Sa with orthopedics for her right shoulder at the end of this month. We again discussed pulsed radiofrequency ablation as well as peripheral nerve stimulation of the suprascapular nerve for the purpose of long-term benefit.  We will assess her response in 6-8 weeks.  Orders:  Orders Placed This Encounter  Procedures   DG PA[facility] C-ARM 1-60 MIN NO REPORT    Intraoperative interpretation by procedural physician at [place].    Standing Status:   Standing    Number of Occurrences:   1    Order Specific Question:   Reason for exam:    Answer:   Assistance in needle guidance and placement for procedures requiring needle placement in or near specific anatomical locations not easily accessible without such assistance.   Chronic Opioid Analgesic:   Tramadol 50 mg daily prn severe breakthrough pain    Medications ordered for procedure: Meds ordered this encounter  Medications   iohexol (OMNIPAQUE) 180 MG/ML injection 10 mL    Must be Myelogram-compatible. If not available, you may substitute with a water-soluble, non-ionic, hypoallergenic, myelogram-compatible radiological contrast medium.   lidocaine (XYLOCAINE) 2 % (with pres) injection 400 mg   dexamethasone (DECADRON) injection 10 mg   ropivacaine (PF) 2 mg/mL (0.2%) (NAROPIN) injection 4 mL    Patient had nausea with 1 dose of tramadol.  I will send in a prescription for Zofran as above.  If she continues to have nausea, we will need to find an alternative to tramadol.  Medications administered: We administered iohexol, lidocaine, dexamethasone, and ropivacaine (PF) 2 mg/mL (0.2%).  See the medical record for exact dosing, route, and time of administration.  Follow-up plan:   Return in about 6 weeks (around 03/04/2022), or F2F PPE, ANYTIME AFTER THE 15TH OF DECEMBER.       Right SSNB 01/06/21, 08/06/21 , 10/08/21, 01/21/22    Recent Visits Date Type Provider Dept  01/15/22 Office Visit Gillis Santa, MD [facility]  12/23/21 Office Visit Gillis Santa, MD [facility]  10/28/21 Office Visit Gillis Santa, MD [facility]  Showing recent visits within past 90 days and meeting all other requirements Today's Visits Date Type Provider Dept  01/21/22 Procedure visit Gillis Santa, MD [facility]  Showing today's visits and meeting all other requirements Future Appointments Date Type Provider Dept  03/04/22 Appointment Gillis Santa, MD [facility]  Showing future appointments within next 90 days and meeting all other requirements  Disposition: Discharge home  Discharge (Date  Time): 01/21/2022; 0930 hrs.   Primary Care Physician: Venita Lick, NP Location: [facility] Note by: Gillis Santa, MD Date: 01/21/2022; Time: 9:32 AM  Disclaimer:  Medicine is not an exact science. The only guarantee in medicine is that nothing is guaranteed. It is important to note that the decision to proceed with this intervention was based on the information collected from the patient. The Data and conclusions were drawn from the patient's questionnaire, the interview, and the physical examination. Because the information was provided in large part by the patient, it cannot be guaranteed that it has not been purposely  or unconsciously manipulated. Every effort has been made to obtain as much relevant data as possible for  this evaluation. It is important to note that the conclusions that lead to this procedure are derived in large part from the available data. Always take into account that the treatment will also be dependent on availability of resources and existing treatment guidelines, considered by other Pain Management Practitioners as being common knowledge and practice, at the time of the intervention. For Medico-Legal purposes, it is also important to point out that variation in procedural techniques and pharmacological choices are the acceptable norm. The indications, contraindications, technique, and results of the above procedure should only be interpreted and judged by a Board-Certified Interventional Pain Specialist with extensive familiarity and expertise in the same exact procedure and technique.

## 2022-01-21 NOTE — Patient Instructions (Signed)

## 2022-01-22 ENCOUNTER — Telehealth: Payer: Self-pay

## 2022-02-01 NOTE — Patient Instructions (Signed)
Food Basics for Chronic Kidney Disease Chronic kidney disease (CKD) is when your kidneys are not working well. They cannot remove waste, fluids, and other substances from your blood the way they should. These substances can build up, which can worsen kidney damage and affect how your body works. Eating certain foods can lead to a buildup of these substances. Changing your diet can help prevent more kidney damage. Diet changes may also delay dialysis or even keep you from needing it. What nutrients should I limit? Work with your treatment team and a food expert (dietitian) to make a meal plan that's right for you. Foods you can eat and foods you should limit or avoid will depend on the stage of your kidney disease and any other health conditions you have. The items listed below are not a complete list. Talk with your dietitian to learn what is best for you. Potassium Potassium affects how steadily your heart beats. Too much potassium in your blood can cause an irregular heartbeat or even a heart attack. You may need to limit foods that are high in potassium, such as: Liquid milk and soy milk. Salt substitutes that contain potassium. Fruits like bananas, apricots, nectarines, melon, prunes, raisins, kiwi, and oranges. Vegetables, such as potatoes, sweet potatoes, yams, tomatoes, leafy greens, beets, avocado, pumpkin, and winter squash. Beans, like lima beans. Nuts. Phosphorus Phosphorus is a mineral found in your bones. You need a balance between calcium and phosphorus to build and maintain healthy bones. Too much added phosphorus from the foods you eat can pull calcium from your bones. Losing calcium can make your bones weak and more likely to break. Too much phosphorus can also make your skin itch. You may need to limit foods that are high in phosphorus or that have added phosphorus, such as: Liquid milk and dairy products. Dark-colored sodas or soft drinks. Bran cereals and  oatmeal. Protein  Protein helps you make and keep muscle. Protein also helps to repair your body's cells and tissues. One of the natural breakdown products of protein is a waste product called urea. When your kidneys are not working well, they cannot remove types of waste like urea. Reducing protein in your diet can help keep urea from building up in your blood. Depending on your stage of kidney disease, you may need to eat smaller portions of foods that are high in protein. Sources of animal protein include: Meat (all types). Fish and seafood. Poultry. Eggs. Dairy. Other protein foods include: Beans and legumes. Nuts and nut butter. Soy, like tofu.  Sodium Salt (sodium) helps to keep a healthy balance of fluids in your body. Too much salt can increase your blood pressure, which can harm your heart and lungs. Extra salt can also cause your body to keep too much fluid, making your kidneys work harder. You may need to limit or avoid foods that are high in salt, such as: Salt seasonings. Soy and teriyaki sauce. Packaged, precooked, cured, or processed meats, such as sausages or meat loaves. Sardines. Salted crackers and snack foods. Fast food. Canned soups and most canned foods. Pickled foods. Vegetable juice. Boxed mixes or ready-to-eat boxed meals and side dishes. Bottled dressings, sauces, and marinades. Talk with your dietitian about how much potassium, phosphorus, protein, and salt you may have each day. Helpful tips Read food labels  Check the amount of salt in foods. Limit foods that have salt or sodium listed among the first five ingredients. Try to eat low-salt foods. Check the ingredient list   for added phosphorus or potassium. "Phos" in an ingredient is a sign that phosphorus has been added. Do not buy foods that are calcium-enriched or that have calcium added to them (are fortified). Buy canned vegetables and beans that say "no salt added" and rinse them before  eating. Lifestyle Limit the amount of protein you eat from animal sources each day. Focus on protein from plant sources, like tofu and dried beans, peas, and lentils. Do not add salt to food when cooking or before eating. Do not eat star fruit. It can be toxic for people with kidney problems. Talk with your health care provider before taking any vitamin or mineral supplements. If told by your health care provider, track how much liquid you drink so you can avoid drinking too much. You may need to include foods you eat that are made mostly from water, like gelatin, ice cream, soups, and juicy fruits and vegetables. If you have diabetes: If you have diabetes (diabetes mellitus) and CKD, you need to keep your blood sugar (glucose) in the target range recommended by your health care provider. Follow your diabetes management plan. This may include: Checking your blood glucose regularly. Taking medicines by mouth, or taking insulin, or both. Exercising for at least 30 minutes on 5 or more days each week, or as told by your health care provider. Tracking how many servings of carbohydrates you eat at each meal. Not using orange juice to treat low blood sugars. Instead, use apple juice, cranberry juice, or clear soda. You may be given guidelines on what foods and nutrients you may eat, and how much you can have each day. This depends on your stage of kidney disease and whether you have high blood pressure (hypertension). Follow the meal plan your dietitian gives you. To learn more: National Institute of Diabetes and Digestive and Kidney Diseases: niddk.nih.gov National Kidney Foundation: kidney.org Summary Chronic kidney disease (CKD) is when your kidneys are not working well. They cannot remove waste, fluids, and other substances from your blood the way they should. These substances can build up, which can worsen kidney damage and affect how your body works. Changing your diet can help prevent more  kidney damage. Diet changes may also delay dialysis or even keep you from needing it. Diet changes are different for each person with CKD. Work with a dietitian to set up a meal plan that is right for you. This information is not intended to replace advice given to you by your health care provider. Make sure you discuss any questions you have with your health care provider. Document Revised: 06/20/2021 Document Reviewed: 06/26/2019 Elsevier Patient Education  2023 Elsevier Inc.  

## 2022-02-02 ENCOUNTER — Ambulatory Visit: Payer: Medicare Other | Admitting: Nurse Practitioner

## 2022-02-03 ENCOUNTER — Ambulatory Visit (INDEPENDENT_AMBULATORY_CARE_PROVIDER_SITE_OTHER): Payer: Medicare Other | Admitting: Nurse Practitioner

## 2022-02-03 ENCOUNTER — Encounter: Payer: Self-pay | Admitting: Nurse Practitioner

## 2022-02-03 VITALS — BP 125/72 | HR 74 | Temp 97.8°F | Ht 62.01 in | Wt 108.9 lb

## 2022-02-03 DIAGNOSIS — M8588 Other specified disorders of bone density and structure, other site: Secondary | ICD-10-CM

## 2022-02-03 DIAGNOSIS — M25511 Pain in right shoulder: Secondary | ICD-10-CM

## 2022-02-03 DIAGNOSIS — I872 Venous insufficiency (chronic) (peripheral): Secondary | ICD-10-CM | POA: Diagnosis not present

## 2022-02-03 DIAGNOSIS — I83819 Varicose veins of unspecified lower extremities with pain: Secondary | ICD-10-CM

## 2022-02-03 DIAGNOSIS — G8929 Other chronic pain: Secondary | ICD-10-CM

## 2022-02-03 DIAGNOSIS — I10 Essential (primary) hypertension: Secondary | ICD-10-CM

## 2022-02-03 DIAGNOSIS — E782 Mixed hyperlipidemia: Secondary | ICD-10-CM

## 2022-02-03 DIAGNOSIS — I7300 Raynaud's syndrome without gangrene: Secondary | ICD-10-CM

## 2022-02-03 DIAGNOSIS — Z23 Encounter for immunization: Secondary | ICD-10-CM

## 2022-02-03 DIAGNOSIS — N1831 Chronic kidney disease, stage 3a: Secondary | ICD-10-CM | POA: Diagnosis not present

## 2022-02-03 DIAGNOSIS — E559 Vitamin D deficiency, unspecified: Secondary | ICD-10-CM

## 2022-02-03 DIAGNOSIS — G894 Chronic pain syndrome: Secondary | ICD-10-CM

## 2022-02-03 NOTE — Assessment & Plan Note (Signed)
Chronic.  Noted on DEXA 12/07/19 with T score -2.2.  Continue daily Vitamin D and check level today.  Next scan 12/06/2024.

## 2022-02-03 NOTE — Assessment & Plan Note (Signed)
Chronic, continue collaboration with vascular -- obtain lumbar spine imaging to further assess back and see if cause for left lower leg discomfort ongoing.

## 2022-02-03 NOTE — Assessment & Plan Note (Signed)
Chronic, stable.  BP at goal in office today.  Continue current medication regimen and adjust as needed.  Refills up to date.  Recommend focus on DASH diet at home and continue to monitor BP regularly.  LABS: CMP and Lipid today.

## 2022-02-03 NOTE — Assessment & Plan Note (Signed)
Chronic, ongoing, taking Crestor daily and tolerating.  Continue this medication regimen and adjust as needed.  Lipid panel and CMP today.

## 2022-02-03 NOTE — Assessment & Plan Note (Signed)
With bilateral varicose veins.  Will continue collaboration with vascular, she is to reschedule with them.  Obtain lumbar spine imaging to assess for sciatic as cause of left lower leg pain ongoing for over one year.

## 2022-02-03 NOTE — Assessment & Plan Note (Signed)
Ongoing, continue Amlodipine at this time -- is tolerating and BP improved.

## 2022-02-03 NOTE — Assessment & Plan Note (Signed)
This has been stable for some time now with HTN controlled, will discontinue and continue to monitor labs.

## 2022-02-03 NOTE — Assessment & Plan Note (Signed)
Ongoing, post multiple surgeries, does report some improvement at this time after recent injection.  Continue to collaborate with Dr. Holley Raring at pain clinic, recommend she return for visit.  Continue current medication regimen.  Educated her on medication and use + side effects.

## 2022-02-03 NOTE — Progress Notes (Signed)
BP 125/72   Pulse 74   Temp 97.8 F (36.6 C) (Oral)   Ht 5' 2.01" (1.575 m)   Wt 108 lb 14.4 oz (49.4 kg)   SpO2 94%   BMI 19.91 kg/m    Subjective:    Patient ID: Danielle Stephens, female    DOB: October 17, 1950, 71 y.o.   MRN: [id number]  HPI: Danielle Stephens is a 71 y.o. female  Chief Complaint  Patient presents with   Hypertension   Hyperlipidemia   Chronic Kidney Disease   Shoulder Pain    Patient states that she has appt tomorrow with ortho for shoulder   Varicose Veins   HYPERTENSION & HLD Continues on Lisinopril 20 MG daily + Amlodipine 2.5 MG. Taking Crestor for HLD.  Varicose veins present -- last visit 04/10/20.  They recommended imaging of lumbar spine as concern for neuropathy.  Continues to have discomfort to lower left leg which extends to shin aspect as well.   Hypertension status: stable  Satisfied with current treatment? yes Duration of hypertension: chronic BP monitoring frequency:  rarely BP range:  120-130/70-80 BP medication side effects:  no Medication compliance: good compliance Previous BP meds: Lisinopril Aspirin: no Recurrent headaches: no Visual changes: no Palpitations: no Dyspnea: no Chest pain: no Lower extremity edema: occasional with varicose veins left Dizzy/lightheaded: no  The 10-year ASCVD risk score (Arnett DK, et al., 2019) is: 9.9%   Values used to calculate the score:     Age: 49 years     Sex: Female     Is Non-Hispanic African American: No     Diabetic: No     Tobacco smoker: No     Systolic Blood Pressure: 846 mmHg     Is BP treated: Yes     HDL Cholesterol: 65 mg/dL     Total Cholesterol: 173 mg/dL  CHRONIC KIDNEY DISEASE Recent labs stable = showing normal levels for past several checks. CKD status: stable Medications renally dose: yes Previous renal evaluation: no Pneumovax:  Up to Date Influenza Vaccine:  Up to Date   INSOMNIA Continues on Gabapentin + Robaxin -- taking lower dose in evening.  Sees pain clinic, last  injection 01/21/22 -- is to see shoulder specialist coming up and obtaining CT scan.   Duration: chronic Satisfied with sleep quality: no Difficulty falling asleep: no Difficulty staying asleep: yes Waking a few hours after sleep onset: yes Early morning awakenings: no Daytime hypersomnolence: no Wakes feeling refreshed: no Good sleep hygiene: yes Apnea: no Snoring: no Depressed/anxious mood: no Recent stress: no Restless legs/nocturnal leg cramps: no Chronic pain/arthritis: yes History of sleep study: no Treatments attempted: Melatonin, Zquil    OSTEOPENIA Noted on imaging 12/07/19. Satisfied with current treatment?: yes Adequate calcium & vitamin D: yes Weight bearing exercises: yes   Relevant past medical, surgical, family and social history reviewed and updated as indicated. Interim medical history since our last visit reviewed. Allergies and medications reviewed and updated.  Review of Systems  Constitutional:  Negative for activity change, appetite change, diaphoresis, fatigue and fever.  Respiratory:  Negative for cough, chest tightness and shortness of breath.   Cardiovascular:  Negative for chest pain, palpitations and leg swelling.  Gastrointestinal: Negative.   Musculoskeletal:  Positive for arthralgias.  Neurological: Negative.   Psychiatric/Behavioral:  Positive for sleep disturbance. Negative for decreased concentration, self-injury and suicidal ideas. The patient is not nervous/anxious.     Per HPI unless specifically indicated above     Objective:  BP 125/72   Pulse 74   Temp 97.8 F (36.6 C) (Oral)   Ht 5' 2.01" (1.575 m)   Wt 108 lb 14.4 oz (49.4 kg)   SpO2 94%   BMI 19.91 kg/m   Wt Readings from Last 3 Encounters:  02/03/22 108 lb 14.4 oz (49.4 kg)  01/21/22 106 lb (48.1 kg)  12/23/21 109 lb (49.4 kg)    Physical Exam Vitals and nursing note reviewed.  Constitutional:      General: She is awake. She is not in acute distress.     Appearance: She is well-developed and well-groomed. She is not ill-appearing.  HENT:     Head: Normocephalic.     Right Ear: Hearing normal.     Left Ear: Hearing normal.  Eyes:     General: Lids are normal.        Right eye: No discharge.        Left eye: No discharge.     Conjunctiva/sclera: Conjunctivae normal.     Pupils: Pupils are equal, round, and reactive to light.  Neck:     Thyroid: No thyromegaly.     Vascular: No carotid bruit.  Cardiovascular:     Rate and Rhythm: Normal rate and regular rhythm.     Heart sounds: Normal heart sounds. No murmur heard.    No gallop.  Pulmonary:     Effort: Pulmonary effort is normal.     Breath sounds: Normal breath sounds.  Abdominal:     General: Bowel sounds are normal.     Palpations: Abdomen is soft.  Musculoskeletal:     Right shoulder: Tenderness (posterior) present. No swelling, laceration, bony tenderness or crepitus. Decreased range of motion. Decreased strength.     Left shoulder: Normal.     Right hand: Normal.     Left hand: Normal.     Cervical back: Normal range of motion and neck supple.     Right lower leg: No edema.     Left lower leg: No edema.  Skin:    General: Skin is warm and dry.  Neurological:     Mental Status: She is alert and oriented to person, place, and time.  Psychiatric:        Attention and Perception: Attention normal.        Mood and Affect: Mood normal.        Speech: Speech normal.        Behavior: Behavior normal. Behavior is cooperative.        Thought Content: Thought content normal.    Results for orders placed or performed during the hospital encounter of 09/23/21  Surgical pathology  Result Value Ref Range   SURGICAL PATHOLOGY      SURGICAL PATHOLOGY CASE: [id number] PATIENT: [name] Surgical Pathology Report     Specimen Submitted: A. Colon polyp x1, asc; cold snare  Clinical History: Colon cancer screening Z12.11.    DIAGNOSIS: A. COLON POLYP, ASCENDING; COLD  SNARE: - TUBULAR ADENOMA. - NEGATIVE FOR HIGH-GRADE DYSPLASIA AND MALIGNANCY.  GROSS DESCRIPTION: A. Labeled: Ascending colon polyp x 1-cold snare Received: Formalin Collection time: 9:06 AM on 09/23/2021 Placed into formalin time: 9:06 AM on 09/23/2021 Tissue fragment(s): 1 Size: 0.5 x 0.4 x 0.1 cm Description: Tan soft tissue fragment Entirely submitted in 1 cassette.  CM 09/24/2021  Final Diagnosis performed by Allena Napoleon, MD.   Electronically signed 09/25/2021 8:23:30AM The electronic signature indicates that the named Attending Pathologist has evaluated the specimen Technical component performed  at [facility], [address] [place], [place] [address] Lab: [phone] Dir: Rush Farmer, MD, MMM  Professional compone nt performed at [facility], [facility], [place], [place], [place] [address] Lab: [phone] Dir: Kathi Simpers, MD       Assessment & Plan:   Problem List Items Addressed This Visit       Cardiovascular and Mediastinum   Benign essential HTN    Chronic, stable.  BP at goal in office today.  Continue current medication regimen and adjust as needed.  Refills up to date.  Recommend focus on DASH diet at home and continue to monitor BP regularly.  LABS: CMP and Lipid today.      Relevant Orders   Comprehensive metabolic panel   Chronic venous insufficiency    With bilateral varicose veins.  Will continue collaboration with vascular, she is to reschedule with them.  Obtain lumbar spine imaging to assess for sciatic as cause of left lower leg pain ongoing for over one year.      Relevant Orders   DG Lumbar Spine Complete   Raynaud's disease without gangrene    Ongoing, continue Amlodipine at this time -- is tolerating and BP improved.        Musculoskeletal and Integument   Osteopenia    Chronic.  Noted on DEXA 12/07/19 with T score -2.2.  Continue daily Vitamin D and check level today.  Next scan 12/06/2024.      Relevant Orders    VITAMIN D 25 Hydroxy (Vit-D Deficiency, Fractures)     Genitourinary   CKD (chronic kidney disease) stage 3, GFR 30-59 ml/min (HCC) - Primary    This has been stable for some time now with HTN controlled, will discontinue and continue to monitor labs.      Relevant Orders   Comprehensive metabolic panel     Other   Chronic right shoulder pain    Ongoing, post multiple surgeries, does report some improvement at this time after recent injection.  Continue to collaborate with Dr. Holley Raring at pain clinic, recommend she return for visit.  Continue current medication regimen.  Educated her on medication and use + side effects.        Hyperlipidemia, mixed    Chronic, ongoing, taking Crestor daily and tolerating.  Continue this medication regimen and adjust as needed.  Lipid panel and CMP today.      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      Vitamin D deficiency    Noted on past labs with osteopenia.  Continue supplement and check Vit D level today.      Relevant Orders   VITAMIN D 25 Hydroxy (Vit-D Deficiency, Fractures)   Other Visit Diagnoses     Flu vaccine need       Flu vaccine in office today.   Relevant Orders   Flu Vaccine QUAD High Dose(Fluad) (Completed)        Follow up plan: Return in about 6 months (around 08/04/2022) for Annual physical.

## 2022-02-03 NOTE — Assessment & Plan Note (Signed)
Noted on past labs with osteopenia.  Continue supplement and check Vit D level today.

## 2022-02-04 ENCOUNTER — Encounter: Payer: Self-pay | Admitting: Orthopedic Surgery

## 2022-02-04 ENCOUNTER — Ambulatory Visit (INDEPENDENT_AMBULATORY_CARE_PROVIDER_SITE_OTHER): Payer: Medicare Other | Admitting: Orthopedic Surgery

## 2022-02-04 ENCOUNTER — Ambulatory Visit (INDEPENDENT_AMBULATORY_CARE_PROVIDER_SITE_OTHER): Payer: Medicare Other

## 2022-02-04 DIAGNOSIS — M79601 Pain in right arm: Secondary | ICD-10-CM | POA: Diagnosis not present

## 2022-02-04 LAB — COMPREHENSIVE METABOLIC PANEL
ALT: 14 IU/L (ref 0–32)
AST: 26 IU/L (ref 0–40)
Albumin/Globulin Ratio: 1.6 (ref 1.2–2.2)
Albumin: 4.2 g/dL (ref 3.9–4.9)
Alkaline Phosphatase: 66 IU/L (ref 44–121)
BUN/Creatinine Ratio: 16 (ref 12–28)
BUN: 14 mg/dL (ref 8–27)
Bilirubin Total: 0.4 mg/dL (ref 0.0–1.2)
CO2: 27 mmol/L (ref 20–29)
Calcium: 9.4 mg/dL (ref 8.7–10.3)
Chloride: 104 mmol/L (ref 96–106)
Creatinine, Ser: 0.89 mg/dL (ref 0.57–1.00)
Globulin, Total: 2.7 g/dL (ref 1.5–4.5)
Glucose: 72 mg/dL (ref 70–99)
Potassium: 4.2 mmol/L (ref 3.5–5.2)
Sodium: 144 mmol/L (ref 134–144)
Total Protein: 6.9 g/dL (ref 6.0–8.5)
eGFR: 70 mL/min/{1.73_m2} (ref >59)

## 2022-02-04 LAB — VITAMIN D 25 HYDROXY (VIT D DEFICIENCY, FRACTURES): Vit D, 25-Hydroxy: 36.5 ng/mL (ref 30.0–100.0)

## 2022-02-04 LAB — LIPID PANEL W/O CHOL/HDL RATIO
Cholesterol, Total: 166 mg/dL (ref 100–199)
HDL: 64 mg/dL (ref >39)
LDL Chol Calc (NIH): 85 mg/dL (ref 0–99)
Triglycerides: 92 mg/dL (ref 0–149)
VLDL Cholesterol Cal: 17 mg/dL (ref 5–40)

## 2022-02-04 NOTE — Progress Notes (Signed)
Contacted via [place] afternoon [name], your labs have returned and overall look great.  Continue all medications and supplements.  Great job!!! Keep being awesome!!  Thank you for allowing me to participate in your care.  I appreciate you. Kindest regards, Larkyn Greenberger

## 2022-02-07 ENCOUNTER — Encounter: Payer: Self-pay | Admitting: Orthopedic Surgery

## 2022-02-07 NOTE — Progress Notes (Signed)
Office Visit Note   Patient: Danielle Stephens           Date of Birth: [birth year]           MRN: [id number] Visit Date: 02/04/2022 Requested by: Gillis Santa, [place],   [address] PCP: Venita Lick, NP  Subjective: Chief Complaint  Patient presents with   Right Shoulder - Pain    HPI: Danielle Stephens is a 71 y.o. female who presents to the office reporting right shoulder pain.  Patient has been in pain management for a year.  Interestingly has had 4 injections into her reverse shoulder replacement over the past year most recently in early November.  She does get relief for about 1 to 2 weeks with this.  Describes episodes of severe pain.  Denies any fevers or chills.  States that her range of motion is getting little bit worse.  Has not had any help from physical therapy.  She has tried many medications.  Reports pain radiating from her arm to digits 1 2 and 3 on the right.  She states that shoulder "gets stuck".  Muscle relaxer helps her some.  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  MRI scan has been done of the right shoulder which is reviewed.  This shows no focal fluid collection with postsurgical changes of right reverse shoulder replacement and no obvious abnormality..                ROS: All systems reviewed are negative as they relate to the chief complaint within the history of present illness.  Patient denies fevers or chills.  Assessment & Plan: Visit Diagnoses:  1. Right arm pain     Plan: Impression is right shoulder pain and right arm pain.  Could be radiculopathy.  Does have degenerative disc disease at C5-6 and C6-7.  Alternatively this could be an occult low-grade infection.  Shoulder is somewhat stiff but difficult to say with certainty what the pain generators are.  I think in general if this is coming from her neck then  intervention could be considered in terms of injection versus surgery.  If the problem is coming from the shoulder then that could be addressed depending on identification clearly of a problem with the prosthesis.  Plan is CT scan right shoulder to evaluate for loosening and MRI cervical spine to evaluate for right-sided radiculopathy  Follow-Up Instructions: No follow-ups on file.   Orders:  Orders Placed This Encounter  Procedures   XR Cervical Spine 2 or 3 views   CT SHOULDER RIGHT WO CONTRAST   MR Cervical Spine w/o contrast   No orders of the defined types were placed in this encounter.     Procedures: No procedures performed   Clinical Data: No additional findings.  Objective: Vital Signs: There were no vitals taken for this visit.  Physical Exam:  Constitutional: Patient appears well-developed HEENT:  Head: Normocephalic Eyes:EOM are normal Neck: Normal range of motion Cardiovascular: Normal rate Pulmonary/chest: Effort normal Neurologic: Patient is alert Skin: Skin is warm Psychiatric: Patient has normal mood and affect  Ortho Exam: Ortho exam demonstrates range of motion of the right shoulder 40/90/90.  Deltoid is functional.  Incision is intact.  No lymphadenopathy or warmth around the shoulder joint.  No real hitches with range of motion.  Predictable weakness with external rotation.  Mild  paresthesias C6 distribution on the right compared to the left but intact EPL FPL interosseous wrist flexion extension bicep triceps and deltoid strength bilaterally.  Neck range of motion intact.  Specialty Comments:  No specialty comments available.  Imaging: No results found.   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   Insomnia 07/23/2021   Raynaud's disease without gangrene 05/06/2021   Type B blood, Rh positive 05/04/2021   PAC (premature atrial contraction) 04/14/2021   Chronic right shoulder pain 12/23/2020   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/2020   Right rotator cuff tear arthropathy 12/23/2020   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   Osteopenia 02/11/2020   Vitamin D deficiency 08/18/2019   Hyperlipidemia, mixed 08/17/2019   H/O repair of right rotator cuff 04/10/2019   Benign essential HTN 03/19/2019   Past Medical History:  Diagnosis Date   Anxiety    Chronic kidney disease    STAGE 3   Hypertension    Shoulder pain     Family History  Problem Relation Age of Onset   Hypertension Mother    Cancer Mother    Hypertension Father    Heart attack Father    Stroke Sister    Hypertension Sister    Heart attack Brother    Hypertension Sister    Hypertension Sister    Hypertension Sister    Hypertension Sister    Hypertension Sister    Down syndrome Brother    Breast cancer Neg Hx     Past Surgical History:  Procedure Laterality Date   CLOSED MANIPULATION SHOULDER WITH STERIOD INJECTION Right 08/28/2019   Procedure: MANIPULATION SHOULDER WITH STEROID INJECTION x2;  Surgeon: Leim Fabry, MD;  Location: [facility];  Service: Orthopedics;  Laterality: Right;   COLONOSCOPY     COLONOSCOPY WITH PROPOFOL N/A 09/23/2021   Procedure: COLONOSCOPY WITH PROPOFOL;  Surgeon: Lin Landsman, MD;  Location: [facility];  Service: Endoscopy;  Laterality: N/A;  ONE ASCENDING COLON POLYP NOT RETRIEVED   DILATION AND CURETTAGE, DIAGNOSTIC / THERAPEUTIC     POLYPECTOMY N/A 09/23/2021   Procedure: POLYPECTOMY;  Surgeon: Lin Landsman, MD;  Location: [facility];  Service: Endoscopy;  Laterality: N/A;   REVERSE SHOULDER ARTHROPLASTY Right 05/20/2020   Procedure: Right reverse  shoulder arthroplasty, biceps tenodesis;  Surgeon: Leim Fabry, MD;  Location: [facility];  Service: Orthopedics;  Laterality: Right;   rotary cuff Right 03/06/2019   SHOULDER ARTHROSCOPY Right 08/28/2019   Procedure: Right shoulder arthroscopic capsular release, lysis of adhesion,;  Surgeon: Leim Fabry, MD;  Location: [facility];  Service: Orthopedics;  Laterality: Right;   Social History   Occupational History   Occupation: retired  Tobacco Use   Smoking status: Never   Smokeless tobacco: Never  Vaping Use   Vaping Use: Never used  Substance and Sexual Activity   Alcohol use: No   Drug use: No   Sexual activity: Yes

## 2022-02-13 ENCOUNTER — Ambulatory Visit: Payer: Medicare Other | Admitting: Orthopedic Surgery

## 2022-02-13 ENCOUNTER — Ambulatory Visit
Admission: RE | Admit: 2022-02-13 | Discharge: 2022-02-13 | Disposition: A | Discharge status: Home / Self Care | Payer: Medicare Other | Source: Ambulatory Visit | Attending: Orthopedic Surgery | Admitting: Orthopedic Surgery

## 2022-02-13 DIAGNOSIS — M79601 Pain in right arm: Secondary | ICD-10-CM

## 2022-02-13 DIAGNOSIS — M25511 Pain in right shoulder: Secondary | ICD-10-CM | POA: Diagnosis not present

## 2022-02-16 ENCOUNTER — Telehealth: Payer: Self-pay

## 2022-02-16 NOTE — Telephone Encounter (Signed)
-----   Message from Meredith Pel, MD sent at 02/16/2022 11:32 AM EST ----- Needs f/u after c sp mri thx

## 2022-02-16 NOTE — Telephone Encounter (Signed)
Tried calling patient to schedule MRI review-no answer.

## 2022-02-16 NOTE — Telephone Encounter (Signed)
Scheduled for 12/11.

## 2022-02-16 NOTE — Progress Notes (Signed)
Needs f/u after c sp mri thx

## 2022-02-23 ENCOUNTER — Encounter: Payer: Self-pay | Admitting: Orthopedic Surgery

## 2022-02-23 ENCOUNTER — Ambulatory Visit (INDEPENDENT_AMBULATORY_CARE_PROVIDER_SITE_OTHER): Payer: Medicare Other | Admitting: Surgical

## 2022-02-23 DIAGNOSIS — M79601 Pain in right arm: Secondary | ICD-10-CM

## 2022-02-23 NOTE — Progress Notes (Signed)
Office Visit Note   Patient: Danielle Stephens           Date of Birth: 1950/09/14           MRN: [id number] Visit Date: 02/23/2022 Requested by: Venita Lick, NP [address] [place],  [place] [address] PCP: Venita Lick, NP  Subjective: Chief Complaint  Patient presents with   Other     Scan review    HPI: Danielle Stephens is a 71 y.o. female who presents to the office reporting right shoulder pain.  Patient reports continued right shoulder pain since last visit with Dr. Marlou Sa.  She also notes numbness and tingling in the anterior aspect of the shoulder with "soreness" in the lateral aspect of the shoulder that extends down to the elbow.  She has associated scapular pain and trapezius pain.  She also has noticed increased neck pain in the last month.  All of this pain wakes her up at night.  She also has similar symptoms in the left shoulder to a lesser extent.  She has been receiving several injections in the suprascapular region from her pain management physician at provide relief for about a week or 2..                ROS: All systems reviewed are negative as they relate to the chief complaint within the history of present illness.  Patient denies fevers or chills.  Assessment & Plan: Visit Diagnoses:  1. Right arm pain     Plan: Patient is a 71 year old female who presents for evaluation of right shoulder pain.  She has history of right shoulder reverse shoulder arthroplasty done at an outside facility.  She has had no relief of her shoulder pain at any point in time from surgery.  She denies any fevers or chills or any signs of infection.  She has a lot of nebulous pain in the shoulder region with some pins and needle sensation that is concerning for nerve impingement or cervical spine pathology.  Plan for further evaluation of potential cervical radiculopathy with MRI cervical spine.  Follow-up after MRI to review results with Dr. Marlou Sa.  She has previous radiographs taken about 3 weeks ago  demonstrating mild to moderate degenerative changes at multiple levels with grade 1 spondylolisthesis noted at C4-C5.  CT scan of the right shoulder was reviewed with her today demonstrating no fluid collection or any abnormality related to the hardware position.  Does not look like there is any screw impingement or hardware impingement in the suprascapular region.  Follow-Up Instructions: No follow-ups on file.   Orders:  Orders Placed This Encounter  Procedures   MR Cervical Spine w/o contrast   No orders of the defined types were placed in this encounter.     Procedures: No procedures performed   Clinical Data: No additional findings.  Objective: Vital Signs: There were no vitals taken for this visit.  Physical Exam:  Constitutional: Patient appears well-developed HEENT:  Head: Normocephalic Eyes:EOM are normal Neck: Normal range of motion Cardiovascular: Normal rate Pulmonary/chest: Effort normal Neurologic: Patient is alert Skin: Skin is warm Psychiatric: Patient has normal mood and affect  Ortho Exam: Ortho exam demonstrates right shoulder with 45 degrees X rotation, 60 degrees abduction, 100 degrees or flexion.  Eksir nerve intact with deltoid firing.  Weakness of the subscapularis.  Negative Hoffmann sign.  Weakly positive Lhermitte sign on the right.  Negative Spurling sign.  5/5 motor strength of bilateral EPL,  FPL, finger abduction, grip strength testing, pronation/supination, bicep, deltoid.  5 -/5 tricep strength of the right versus the left which is 5/5.  Equivalent bicep tendon deep tendon reflexes.  Specialty Comments:  No specialty comments available.  Imaging: No results found.   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  09/01/2021   Chronic pain syndrome 07/29/2021   Suprascapular entrapment neuropathy of right side 07/29/2021   Insomnia 07/23/2021   Raynaud's disease without gangrene 05/06/2021   Type B blood, Rh positive 05/04/2021   PAC (premature atrial contraction) 04/14/2021   Chronic right shoulder pain 12/23/2020   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/2020   Right rotator cuff tear arthropathy 12/23/2020   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   Osteopenia 02/11/2020   Vitamin D deficiency 08/18/2019   Hyperlipidemia, mixed 08/17/2019   H/O repair of right rotator cuff 04/10/2019   Benign essential HTN 03/19/2019   Past Medical History:  Diagnosis Date   Anxiety    Chronic kidney disease    STAGE 3   Hypertension    Shoulder pain     Family History  Problem Relation Age of Onset   Hypertension Mother    Cancer Mother    Hypertension Father    Heart attack Father    Stroke Sister    Hypertension Sister    Heart attack Brother    Hypertension Sister    Hypertension Sister    Hypertension Sister    Hypertension Sister    Hypertension Sister    Down syndrome Brother    Breast cancer Neg Hx     Past Surgical History:  Procedure Laterality Date   CLOSED MANIPULATION SHOULDER WITH STERIOD INJECTION Right 08/28/2019   Procedure: MANIPULATION SHOULDER WITH STEROID INJECTION x2;  Surgeon: Leim Fabry, MD;  Location: [facility];  Service: Orthopedics;  Laterality: Right;   COLONOSCOPY     COLONOSCOPY WITH PROPOFOL N/A 09/23/2021   Procedure: COLONOSCOPY WITH PROPOFOL;  Surgeon: Lin Landsman, MD;  Location: [facility];  Service: Endoscopy;  Laterality: N/A;  ONE ASCENDING COLON POLYP NOT RETRIEVED   DILATION AND CURETTAGE, DIAGNOSTIC / THERAPEUTIC     POLYPECTOMY N/A 09/23/2021   Procedure: POLYPECTOMY;  Surgeon: Lin Landsman, MD;  Location: [facility];  Service: Endoscopy;  Laterality: N/A;   REVERSE SHOULDER ARTHROPLASTY Right 05/20/2020    Procedure: Right reverse shoulder arthroplasty, biceps tenodesis;  Surgeon: Leim Fabry, MD;  Location: [facility];  Service: Orthopedics;  Laterality: Right;   rotary cuff Right 03/06/2019   SHOULDER ARTHROSCOPY Right 08/28/2019   Procedure: Right shoulder arthroscopic capsular release, lysis of adhesion,;  Surgeon: Leim Fabry, MD;  Location: [facility];  Service: Orthopedics;  Laterality: Right;   Social History   Occupational History   Occupation: retired  Tobacco Use   Smoking status: Never   Smokeless tobacco: Never  Vaping Use   Vaping Use: Never used  Substance and Sexual Activity   Alcohol use: No   Drug use: No   Sexual activity: Yes

## 2022-03-03 ENCOUNTER — Encounter: Payer: Self-pay | Admitting: Surgical

## 2022-03-04 ENCOUNTER — Ambulatory Visit: Payer: Medicare Other | Admitting: Student in an Organized Health Care Education/Training Program

## 2022-03-04 ENCOUNTER — Ambulatory Visit
Payer: Medicare Other | Attending: Student in an Organized Health Care Education/Training Program | Admitting: Student in an Organized Health Care Education/Training Program

## 2022-03-04 ENCOUNTER — Encounter: Payer: Self-pay | Admitting: Student in an Organized Health Care Education/Training Program

## 2022-03-04 DIAGNOSIS — M12811 Other specific arthropathies, not elsewhere classified, right shoulder: Secondary | ICD-10-CM | POA: Diagnosis not present

## 2022-03-04 DIAGNOSIS — M25511 Pain in right shoulder: Secondary | ICD-10-CM | POA: Insufficient documentation

## 2022-03-04 DIAGNOSIS — M75101 Unspecified rotator cuff tear or rupture of right shoulder, not specified as traumatic: Secondary | ICD-10-CM | POA: Diagnosis not present

## 2022-03-04 DIAGNOSIS — Z96611 Presence of right artificial shoulder joint: Secondary | ICD-10-CM | POA: Insufficient documentation

## 2022-03-04 DIAGNOSIS — G8929 Other chronic pain: Secondary | ICD-10-CM | POA: Insufficient documentation

## 2022-03-04 MED ORDER — GABAPENTIN 100 MG PO CAPS: 100.0000 mg | ORAL_CAPSULE | Freq: Every evening | ORAL | 2 refills | Status: DC

## 2022-03-04 MED ORDER — METHOCARBAMOL 500 MG PO TABS: 500.0000 mg | ORAL_TABLET | Freq: Three times a day (TID) | ORAL | 0 refills | Status: DC | PRN

## 2022-03-04 NOTE — Progress Notes (Signed)
PROVIDER NOTE: Information contained herein reflects review and annotations entered in association with encounter. Interpretation of such information and data should be left to medically-trained personnel. Information provided to patient can be located elsewhere in the medical record under "Patient Instructions". Document created using STT-dictation technology, any transcriptional errors that may result from process are unintentional.    Patient: Danielle Stephens  Service Category: E/M  Provider: Gillis Santa, MD  DOB: 07-Dec-1950  DOS: 03/04/2022  Referring Provider: Venita Lick, NP  MRN: [id number]  Specialty: Interventional Pain Management  PCP: Venita Lick, NP  Type: Established Patient  Setting: Ambulatory outpatient    Location: Office  Delivery: Face-to-face     HPI  [name], a 71 y.o. year old female, is here today because of her No primary diagnosis found.. [name]'s primary complain today is Shoulder Pain (right) Last encounter: My last encounter with her was on 01/21/2022. Pertinent problems: [name] has H/O repair of right rotator cuff; Chronic right shoulder pain; History of right shoulder replacement; Right rotator cuff tear arthropathy; Chronic pain syndrome; and Suprascapular entrapment neuropathy of right side on their pertinent problem list. Pain Assessment: Severity of Chronic pain is reported as a 6 /10. Location: Shoulder Right/right upper arm. Onset: More than a month ago. Quality: Constant, Aching. Timing: Constant. Modifying factor(s): muscle relaxer. Vitals:  height is _0  (1.575 m) and weight is 106 lb (48.1 kg). Her temporal temperature is 97.9 F (36.6 C). Her blood pressure is 128/71 and her pulse is 72. Her respiration is 18 and oxygen saturation is 100%.  BMI: Estimated body mass index is 19.39 kg/m as calculated from the following:   Height as of this encounter: _1  (1.575 m).   Weight as of this encounter: 106 lb (48.1 kg).  Reason for encounter:  both, medication management and post-procedure evaluation and assessment.    Post-procedure evaluation   Type: Suprascapular nerve block (SSNB) #4  Laterality:  Right Level: Superior to scapular spine, lateral to supraspinatus fossa (Suprascapular notch).  Imaging: Fluoroscopic guidance Anesthesia: Local anesthesia (1-2% Lidocaine) DOS: 01/21/2022  Performed by: Gillis Santa, MD  Purpose: Diagnostic/Therapeutic Indications: Shoulder pain, severe enough to impact quality of life and/or function. 1. Chronic right shoulder pain   2. History of right shoulder replacement   3. 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 Chronic pain syndrome     NAS-11 score:   Pre-procedure: 9 /10   Post-procedure: 9 /10      Effectiveness:  Initial hour after procedure: 0 %  Subsequent 4-6 hours post-procedure: 0 %  Analgesia past initial 6 hours: 25 %  Ongoing improvement:  Analgesic:  25% Function: [name] reports improvement in function ROM: [name] reports improvement in ROM  Discussed pulsed radiofrequency ablation of the right suprascapular nerve.  Patient will think about it. Otherwise refill of gabapentin and Robaxin as below.  Pharmacotherapy Assessment  Analgesic:  Tramadol 50 mg daily prn severe breakthrough pain    Monitoring: [place] PMP: PDMP not reviewed this encounter.       Pharmacotherapy: No side-effects or adverse reactions reported. Compliance: No problems identified. Effectiveness: Clinically acceptable.  No notes on file  No results found for: "CBDTHCR" No results found for: "D8THCCBX" No results found for: "D9THCCBX"  UDS:  Summary  Date Value Ref Range Status  12/23/2020 Note  Final    Comment:    ==================================================================== Compliance Drug Analysis,  [place] ==================================================================== Test                             Result        Flag       Units  Drug Present and Declared for Prescription Verification   Tramadol                       290          EXPECTED   ng/mg creat   O-Desmethyltramadol            500          EXPECTED   ng/mg creat    Source of tramadol is a prescription medication. O-desmethyltramadol    is an expected metabolite of tramadol.    Acetaminophen                  PRESENT      EXPECTED  Drug Present not Declared for Prescription Verification   Diphenhydramine                PRESENT      UNEXPECTED  Drug Absent but Declared for Prescription Verification   Oxycodone                      Not Detected UNEXPECTED ng/mg creat   Tizanidine                     Not Detected UNEXPECTED    Tizanidine, as indicated in the declared medication list, is not    always detected even when used as directed.    Methocarbamol                  Not Detected UNEXPECTED   Doxylamine                     Not Detected UNEXPECTED ==================================================================== Test                      Result    Flag   Units      Ref Range   Creatinine              30               mg/dL      >=20 ==================================================================== Declared Medications:  The flagging and interpretation on this report are based on the  following declared medications.  Unexpected results may arise from  inaccuracies in the declared medications.   **Note: The testing scope of this panel includes these medications:   Doxylamine  Methocarbamol (Robaxin)  Oxycodone (Roxicodone)  Tramadol (Ultram)   **Note: The testing scope of this panel does not include small to  moderate amounts of these reported medications:   Acetaminophen (Tylenol)  Tizanidine (Zanaflex)   **Note: The testing scope of this panel does not include the  following reported medications:   Biotin  Fexofenadine (Allegra)  Lisinopril (Zestril)  Melatonin  Meloxicam (Mobic)  Multivitamin  Ondansetron  (Zofran)  Vitamin C  Vitamin D ==================================================================== For clinical consultation, please call [phone]. ====================================================================       ROS  Constitutional: Denies any fever or chills Gastrointestinal: No reported hemesis, hematochezia, vomiting, or acute GI distress Musculoskeletal:  Right shoulder, clavicle and bicep pain Neurological: No reported episodes of acute onset apraxia, aphasia, dysarthria, agnosia, amnesia, paralysis, loss of coordination,  or loss of consciousness  Medication Review  Vitamin D, amLODipine, gabapentin, lactase, lisinopril, methocarbamol, multivitamin, ondansetron, and rosuvastatin  History Review  Allergy: [name] is allergic to lactose intolerance (gi) and codeine. Drug: [name]  reports no history of drug use. Alcohol:  reports no history of alcohol use. Tobacco:  reports that she has never smoked. She has never used smokeless tobacco. Social: [name]  reports that she has never smoked. She has never used smokeless tobacco. She reports that she does not drink alcohol and does not use drugs. Medical:  has a past medical history of Anxiety, Chronic kidney disease, Hypertension, and Shoulder pain. Surgical: [name]  has a past surgical history that includes Dilation and curettage, diagnostic / therapeutic; rotary cuff (Right, 03/06/2019); Shoulder arthroscopy (Right, 08/28/2019); Closed manipulation shoulder with steroid injection (Right, 08/28/2019); Colonoscopy; Reverse shoulder arthroplasty (Right, 05/20/2020); Colonoscopy with propofol (N/A, 09/23/2021); and polypectomy (N/A, 09/23/2021). Family: family history includes Cancer in her mother; Down syndrome in her brother; Heart attack in her brother and father; Hypertension in her father, mother, sister, sister, sister, sister, sister, and sister; Stroke in her sister.  Laboratory Chemistry Profile   Renal Lab Results   Component Value Date   BUN 14 02/03/2022   CREATININE 0.89 02/03/2022   BCR 16 02/03/2022   GFRAA 71 02/16/2020   GFRNONAA >60 05/16/2020    Hepatic Lab Results  Component Value Date   AST 26 02/03/2022   ALT 14 02/03/2022   ALBUMIN 4.2 02/03/2022   ALKPHOS 66 02/03/2022    Electrolytes Lab Results  Component Value Date   NA 144 02/03/2022   K 4.2 02/03/2022   CL 104 02/03/2022   CALCIUM 9.4 02/03/2022    Bone Lab Results  Component Value Date   VD25OH 36.5 02/03/2022    Inflammation (CRP: Acute Phase) (ESR: Chronic Phase) Lab Results  Component Value Date   CRP <1 04/14/2021   ESRSEDRATE 8 04/14/2021         Note: Above Lab results reviewed.  Recent Imaging Review  CT SHOULDER RIGHT WO CONTRAST CLINICAL DATA:  Right shoulder pain, decreased range of motion  EXAM: CT OF THE UPPER RIGHT EXTREMITY WITHOUT CONTRAST  TECHNIQUE: Multidetector CT imaging of the upper right extremity was performed according to the standard protocol.  RADIATION DOSE REDUCTION: This exam was performed according to the departmental dose-optimization program which includes automated exposure control, adjustment of the mA and/or kV according to patient size and/or use of iterative reconstruction technique.  COMPARISON:  12/04/2020  FINDINGS: Bones/Joint/Cartilage  Total reverse right shoulder arthroplasty. Beam hardening artifact partially obscuring the adjacent soft tissue and osseous structures. No evidence of hardware loosening. No periarticular fluid collection or osteolysis.  No acute fracture or dislocation. Prior subacromial decompression. Normal alignment. No joint effusion.  Ligaments  Ligaments are suboptimally evaluated by CT.  Muscles and Tendons Muscles are normal. No muscle atrophy. No intramuscular fluid collection or hematoma. Chronic tears of the supraspinatus and infraspinatus tendons.  Soft tissue No fluid collection or hematoma. No soft tissue mass.  Right apical scarring. Stable 3 mm right lower lobe pulmonary nodule.  IMPRESSION: 1. Total reverse right shoulder arthroplasty without evidence of hardware complication. 2. Chronic tears of the supraspinatus and infraspinatus tendons. 3. Stable 3 mm right lower lobe pulmonary nodule. Although likely benign, if the patient is high-risk, given the morphology and/or location of this nodule a non-contrast chest CT can be considered in 12 months.This recommendation follows the consensus statement: Guidelines  for Management of Incidental Pulmonary Nodules Detected on CT Images: From the Fleischner Society 2017; Radiology 2017; [phone].  Electronically Signed   By: Kathreen Devoid M.D.   On: 02/14/2022 09:06 Note: Reviewed        Physical Exam  General appearance: Well nourished, well developed, and well hydrated. In no apparent acute distress Mental status: Alert, oriented x 3 (person, place, & time)       Respiratory: No evidence of acute respiratory distress Eyes: PERLA Vitals: BP 128/71   Pulse 72   Temp 97.9 F (36.6 C) (Temporal)   Resp 18   Ht _0  (1.575 m)   Wt 106 lb (48.1 kg)   SpO2 100%   BMI 19.39 kg/m  BMI: Estimated body mass index is 19.39 kg/m as calculated from the following:   Height as of this encounter: _1  (1.575 m).   Weight as of this encounter: 106 lb (48.1 kg). Ideal: Ideal body weight: 50.1 kg (110 lb 7.2 oz)   Cervical Spine Area Exam  Skin & Axial Inspection: No masses, redness, edema, swelling, or associated skin lesions Alignment: Symmetrical Functional ROM: Pain restricted ROM      Stability: No instability detected Muscle Tone/Strength: Functionally intact. No obvious neuro-muscular anomalies detected. Sensory (Neurological): Referred pain pattern Palpation: No palpable anomalies               Upper Extremity (UE) Exam      Side: Right upper extremity   Side: Left upper extremity  Skin & Extremity Inspection: Skin color, temperature,  and hair growth are WNL. No peripheral edema or cyanosis. No masses, redness, swelling, asymmetry, or associated skin lesions. No contractures.   Skin & Extremity Inspection: Skin color, temperature, and hair growth are WNL. No peripheral edema or cyanosis. No masses, redness, swelling, asymmetry, or associated skin lesions. No contractures.  Functional ROM: Pain restricted ROM           Functional ROM: Unrestricted ROM          Muscle Tone/Strength: Functionally intact. No obvious neuro-muscular anomalies detected.   Muscle Tone/Strength: Functionally intact. No obvious neuro-muscular anomalies detected.  Sensory (Neurological): Neurogenic pain pattern           Sensory (Neurological): Unimpaired          Palpation: No palpable anomalies               Palpation: No palpable anomalies              Provocative Test(s):  Phalen's test: deferred Tinel's test: deferred Apley's scratch test (touch opposite shoulder):  Action 1 (Across chest): Decreased ROM however improved after procedure Action 2 (Overhead): Decreased ROM however improved after procedure Action 3 (LB reach): Decreased ROM however improved after procedure          Assessment   Diagnosis Status  1. Chronic right shoulder pain   2. History of right shoulder replacement   3. Right rotator cuff tear arthropathy    Controlled Controlled Controlled     Plan of Care    [name] has a current medication list which includes the following long-term medication(s): amlodipine, lisinopril, rosuvastatin, and gabapentin.  Pharmacotherapy (Medications Ordered): Meds ordered this encounter  Medications   gabapentin (NEURONTIN) 100 MG capsule    Sig: Take 1-2 capsules (100-200 mg total) by mouth at bedtime. Follow written titration schedule    Dispense:  60 capsule    Refill:  2    Fill  one day early if pharmacy is closed on scheduled refill date. May substitute for generic if available.   methocarbamol (ROBAXIN) 500 MG tablet     Sig: Take 1 tablet (500 mg total) by mouth every 8 (eight) hours as needed for muscle spasms.    Dispense:  90 tablet    Refill:  0    Do not place this medication, or any other prescription from our practice, on "Automatic Refill". Patient may have prescription filled one day early if pharmacy is closed on scheduled refill date.   Consider pulsed radiofrequency ablation of the right suprascapular nerve  Follow-up plan:   Return in about 2 months (around 05/19/2022) for Medication Management, in person.     Right SSNB 01/06/21, 08/06/21 , 10/08/21, 01/21/22     Recent Visits Date Type Provider Dept  01/21/22 Procedure visit Gillis Santa, MD [facility]  01/15/22 Office Visit Gillis Santa, MD [facility]  12/23/21 Office Visit Gillis Santa, MD [facility]  Showing recent visits within past 90 days and meeting all other requirements Today's Visits Date Type Provider Dept  03/04/22 Office Visit Gillis Santa, MD [facility]  Showing today's visits and meeting all other requirements Future Appointments Date Type Provider Dept  05/12/22 Appointment Gillis Santa, MD [facility]  Showing future appointments within next 90 days and meeting all other requirements  I discussed the assessment and treatment plan with the patient. The patient was provided an opportunity to ask questions and all were answered. The patient agreed with the plan and demonstrated an understanding of the instructions.  Patient advised to call back or seek an in-person evaluation if the symptoms or condition worsens.  Duration of encounter: 60mnutes.  Total time on encounter, as per AMA guidelines included both the face-to-face and non-face-to-face time personally spent by the physician and/or other qualified health care professional(s) on the day of the encounter (includes time in activities that require the physician or other qualified health care professional and  does not include time in activities normally performed by clinical staff). Physician's time may include the following activities when performed: Preparing to see the patient (e.g., pre-charting review of records, searching for previously ordered imaging, lab work, and nerve conduction tests) Review of prior analgesic pharmacotherapies. Reviewing PMP Interpreting ordered tests (e.g., lab work, imaging, nerve conduction tests) Performing post-procedure evaluations, including interpretation of diagnostic procedures Obtaining and/or reviewing separately obtained history Performing a medically appropriate examination and/or evaluation Counseling and educating the patient/family/caregiver Ordering medications, tests, or procedures Referring and communicating with other health care professionals (when not separately reported) Documenting clinical information in the electronic or other health record Independently interpreting results (not separately reported) and communicating results to the patient/ family/caregiver Care coordination (not separately reported)  Note by: BGillis Santa MD Date: 03/04/2022; Time: 2:18 PM

## 2022-03-25 ENCOUNTER — Ambulatory Visit
Admission: RE | Admit: 2022-03-25 | Discharge: 2022-03-25 | Disposition: A | Discharge status: Home / Self Care | Payer: Medicare Other | Source: Ambulatory Visit | Attending: Orthopedic Surgery | Admitting: Orthopedic Surgery

## 2022-03-25 DIAGNOSIS — M4802 Spinal stenosis, cervical region: Secondary | ICD-10-CM | POA: Diagnosis not present

## 2022-03-25 DIAGNOSIS — M79601 Pain in right arm: Secondary | ICD-10-CM

## 2022-03-25 NOTE — Progress Notes (Signed)
Hi Lauren does [name] have follow-up?

## 2022-03-26 ENCOUNTER — Telehealth: Payer: Self-pay

## 2022-03-26 NOTE — Telephone Encounter (Signed)
-----   Message from Meredith Pel, MD sent at 03/25/2022  2:04 PM EST ----- Hi Danielle Stephens does Danielle Stephens have follow-up?

## 2022-03-26 NOTE — Telephone Encounter (Signed)
Do you want her to come in to review scan with you or would you prefer to call her and then refer to Dr Laurance Flatten?

## 2022-03-27 NOTE — Telephone Encounter (Signed)
I just called Danielle Stephens.  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  I think this is most likely her worst pain generator for her right-sided symptoms.  Please refer her to Dr. Laurance Flatten for further evaluation and management and decision for or against injections and/or surgical intervention.  Thanks

## 2022-03-30 ENCOUNTER — Ambulatory Visit: Payer: Medicare Other | Admitting: Student in an Organized Health Care Education/Training Program

## 2022-03-30 ENCOUNTER — Ambulatory Visit (INDEPENDENT_AMBULATORY_CARE_PROVIDER_SITE_OTHER): Payer: 59

## 2022-03-30 ENCOUNTER — Encounter: Payer: Self-pay | Admitting: Orthopedic Surgery

## 2022-03-30 ENCOUNTER — Ambulatory Visit (INDEPENDENT_AMBULATORY_CARE_PROVIDER_SITE_OTHER): Payer: 59 | Admitting: Orthopedic Surgery

## 2022-03-30 VITALS — BP 160/72 | HR 70 | Ht 62.0 in | Wt 106.0 lb

## 2022-03-30 DIAGNOSIS — M542 Cervicalgia: Secondary | ICD-10-CM

## 2022-03-30 DIAGNOSIS — M5412 Radiculopathy, cervical region: Secondary | ICD-10-CM

## 2022-03-30 NOTE — Progress Notes (Signed)
Orthopedic Spine Surgery Office Note  Assessment: Patient is a 72 y.o. female with neck pain that radiates into her right shoulder, right forearm and right radial 2 digits.  Seems to be consistent with a cervical radiculopathy, particularly C6.  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   Plan: -Explained that initially conservative treatment is tried as a significant number of patients may experience relief with these treatment modalities. Discussed that the conservative treatments include:  -activity modification  -physical therapy  -over the counter pain medications  -medrol dosepak  -cervical steroid injections -Patient has tried physical therapy, activity modification, narcotics, muscle relaxer, suprascapular steroid injections, pain management, shoulder surgery x 3 -Recommended diagnostic and therapeutic injection to the cervical spine (referral provided to Dr. Ernestina Patches), also discussed EMG/NCS to evaluate for radiculopathy (referral provided to neurology) -Patient should return to office in 7 weeks (after her mission trip), x-rays at next visit: none   Patient expressed understanding of the plan and all questions were answered to the patient's satisfaction.   ___________________________________________________________________________   History:  Patient is a 72 y.o. female who presents today for cervical spine.  Patient has had about 3 years of right shoulder pain.  She states that it it started after lifting heavy countertop.  Pain started in the deltoid and shoulder region.  She had been evaluated and was diagnosed with a rotator cuff tear.  She subsequently underwent rotator cuff repair.  She then developed adhesive capsulitis and underwent MUA.  Then, she underwent a reverse total shoulder arthroplasty on the right side.  She has not had any significant relief after her shoulder surgeries.  She says that over time her pain has changed.  Her pain is now radiating from the  neck into the shoulder and the arm.  It goes into the radial aspect of the forearm and into the index finger and thumb.  Pain is felt on a daily basis.  Nothing seems to make it better or worse.  She has been in pain management and has tried multiple injections (not to the neck) and medications.  She gets very temporary relief with some of these treatments.  She sometimes gets pain radiating into her left shoulder.  This pain is not nearly as severe as the right side.  Pain does not radiate past the deltoid on the left side.  She periodically gets paresthesias going down the right arm in the same distribution as the pain.  No other numbness or paresthesias.  Weakness: Denies Difficulty with fine motor skills (e.g., buttoning shirts, handwriting): Denies Symptoms of imbalance: Denies Bowel or bladder incontinence: Denies Saddle anesthesia: Denies  Treatments tried: Physical therapy, multiple steroid injections (not in the neck), activity modification, narcotics, muscle relaxer, pain management, shoulder surgery x 3  Review of systems: Denies fevers and chills, night sweats, unexplained weight loss, history of cancer.  Has had pain that wakes her at night  Past medical history: Chronic pain Hyperlipidemia Hypertension Osteopenia (being treated with Vit D and Ca)  Allergies: Lactose, codeine  Past surgical history:  Right shoulder surgery x 3 with Dr. Posey Pronto Polypectomy D&C  Social history: Denies use of nicotine product (smoking, vaping, patches, smokeless) Alcohol use: denies  Denies recreational drug use  Physical Exam:  General: no acute distress, appears stated age Neurologic: alert, answering questions appropriately, following commands Respiratory: unlabored breathing on room air, symmetric chest rise Psychiatric: appropriate affect, normal cadence to speech  Incision over anterior shoulder appears well healed with no evidence of  infection  MSK (spine):  -Strength  exam      Left  Right Grip strength               5/5  5/5 Interosseus   5/5   5/5 Wrist extension  5/5  5/5 Wrist flexion   5/5  5/5 Elbow flexion   5/5  5/5 Deltoid    5/5  5/5  EHL    5/5  5/5 TA    5/5  5/5 GSC    5/5  5/5 Knee extension  5/5  5/5 Hip flexion   5/5  5/5  -Sensory exam    Sensation intact to light touch in L3-S1 nerve distributions of bilateral lower extremities  Sensation intact to light touch in C5-T1 nerve distributions of bilateral upper extremities  -Brachioradialis DTR: 1/4 on the left, 1/4 on the right -Biceps DTR: 2/4 on the left, 2/4 on the right -Achilles DTR: 2/4 on the left, 2/4 on the right -Patellar tendon DTR: 3/4 on the left, 2/4 on the right  -Spurling: negative bilaterally -Hoffman sign: negative bilaterally -Clonus: no beats bilaterally -Interosseous wasting: none seen -Grip and release test: negative -Romberg: negative -Gait: normal -Imbalance with tandem gait: no  Left shoulder exam: no pain through range of motion, negative jobe test, no weakness with external rotation with arm and side, negative belly press Right shoulder exam: pain with external rotation past 60 degrees with arm at 90 degrees, no pain with internal rotation, decreased forward flexion and abduction, pain with jobe test, negative drop arm sign, some weakness with external rotation with arm at side, negative belly press  Tinel's at wrist: negative bilaterally Phalen's at wrist: negative bilaterally Durkan's: negative bilaterally  Tinel's at elbow: negative bilaterally  Imaging: XR of the cervical spine from 03/30/2022 and 02/04/2022 was independently reviewed and interpreted, showing spondylolisthesis at C4/5 that shifts about 1.80m between flexion and extension views. Disc height loss and anterior osteophyte formation at C5/6 and C6/7.   MRI of the cervical spine from 03/25/2022 was independently reviewed and interpreted, showing central stenosis at C4/5 and  right-sided paracentral disc herniation at C5/6. Flattening of the cord on the right side at C5/6 posterior to the disc herniation.    Patient name: Danielle WEILBACHERPatient MRN: [id number] of visit: 03/30/22

## 2022-03-31 ENCOUNTER — Encounter: Payer: Self-pay | Admitting: Neurology

## 2022-03-31 ENCOUNTER — Other Ambulatory Visit: Payer: Self-pay

## 2022-03-31 DIAGNOSIS — R202 Paresthesia of skin: Secondary | ICD-10-CM

## 2022-05-12 ENCOUNTER — Ambulatory Visit
Payer: 59 | Attending: Student in an Organized Health Care Education/Training Program | Admitting: Student in an Organized Health Care Education/Training Program

## 2022-05-12 ENCOUNTER — Encounter: Payer: Self-pay | Admitting: Student in an Organized Health Care Education/Training Program

## 2022-05-12 VITALS — BP 121/59 | HR 77 | Temp 99.1°F | Resp 16 | Ht 62.0 in | Wt 106.0 lb

## 2022-05-12 DIAGNOSIS — M5412 Radiculopathy, cervical region: Secondary | ICD-10-CM

## 2022-05-12 DIAGNOSIS — M25511 Pain in right shoulder: Secondary | ICD-10-CM | POA: Insufficient documentation

## 2022-05-12 DIAGNOSIS — Z96611 Presence of right artificial shoulder joint: Secondary | ICD-10-CM

## 2022-05-12 DIAGNOSIS — G5681 Other specified mononeuropathies of right upper limb: Secondary | ICD-10-CM

## 2022-05-12 DIAGNOSIS — G8929 Other chronic pain: Secondary | ICD-10-CM | POA: Diagnosis not present

## 2022-05-12 DIAGNOSIS — M25512 Pain in left shoulder: Secondary | ICD-10-CM

## 2022-05-12 DIAGNOSIS — M5010 Cervical disc disorder with radiculopathy, unspecified cervical region: Secondary | ICD-10-CM

## 2022-05-12 NOTE — Patient Instructions (Signed)
____________________________________________________________________________________________  General Risks and Possible Complications  Patient Responsibilities: It is important that you read this as it is part of your informed consent. It is our duty to inform you of the risks and possible complications associated with treatments offered to you. It is your responsibility as a patient to read this and to ask questions about anything that is not clear or that you believe was not covered in this document.  Patient's Rights: You have the right to refuse treatment. You also have the right to change your mind, even after initially having agreed to have the treatment done. However, under this last option, if you wait until the last second to change your mind, you may be charged for the materials used up to that point.  Introduction: Medicine is not an Chief Strategy Officer. Everything in Medicine, including the lack of treatment(s), carries the potential for danger, harm, or loss (which is by definition: Risk). In Medicine, a complication is a secondary problem, condition, or disease that can aggravate an already existing one. All treatments carry the risk of possible complications. The fact that a side effects or complications occurs, does not imply that the treatment was conducted incorrectly. It must be clearly understood that these can happen even when everything is done following the highest safety standards.  No treatment: You can choose not to proceed with the proposed treatment alternative. The "PRO(s)" would include: avoiding the risk of complications associated with the therapy. The "CON(s)" would include: not getting any of the treatment benefits. These benefits fall under one of three categories: diagnostic; therapeutic; and/or palliative. Diagnostic benefits include: getting information which can ultimately lead to improvement of the disease or symptom(s). Therapeutic benefits are those associated with  the successful treatment of the disease. Finally, palliative benefits are those related to the decrease of the primary symptoms, without necessarily curing the condition (example: decreasing the pain from a flare-up of a chronic condition, such as incurable terminal cancer).  General Risks and Complications: These are associated to most interventional treatments. They can occur alone, or in combination. They fall under one of the following six (6) categories: no benefit or worsening of symptoms; bleeding; infection; nerve damage; allergic reactions; and/or death. No benefits or worsening of symptoms: In Medicine there are no guarantees, only probabilities. No healthcare provider can ever guarantee that a medical treatment will work, they can only state the probability that it may. Furthermore, there is always the possibility that the condition may worsen, either directly, or indirectly, as a consequence of the treatment. Bleeding: This is more common if the patient is taking a blood thinner, either prescription or over the counter (example: Goody Powders, Fish oil, Aspirin, Garlic, etc.), or if suffering a condition associated with impaired coagulation (example: Hemophilia, cirrhosis of the liver, low platelet counts, etc.). However, even if you do not have one on these, it can still happen. If you have any of these conditions, or take one of these drugs, make sure to notify your treating physician. Infection: This is more common in patients with a compromised immune system, either due to disease (example: diabetes, cancer, human immunodeficiency virus [HIV], etc.), or due to medications or treatments (example: therapies used to treat cancer and rheumatological diseases). However, even if you do not have one on these, it can still happen. If you have any of these conditions, or take one of these drugs, make sure to notify your treating physician. Nerve Damage: This is more common when the treatment is an  invasive one, but it can also happen with the use of medications, such as those used in the treatment of cancer. The damage can occur to small secondary nerves, or to large primary ones, such as those in the spinal cord and brain. This damage may be temporary or permanent and it may lead to impairments that can range from temporary numbness to permanent paralysis and/or brain death. Allergic Reactions: Any time a substance or material comes in contact with our body, there is the possibility of an allergic reaction. These can range from a mild skin rash (contact dermatitis) to a severe systemic reaction (anaphylactic reaction), which can result in death. Death: In general, any medical intervention can result in death, most of the time due to an unforeseen complication. ____________________________________________________________________________________________    ______________________________________________________________________  Preparing for your procedure  Appointments: If you think you may not be able to keep your appointment, call 24-48 hours in advance to cancel. We need time to make it available to others.  During your procedure appointment there will be: No Prescription Refills. No disability issues to discussed. No medication changes or discussions.  Instructions: Food intake: Avoid eating anything solid for at least 8 hours prior to your procedure. Clear liquid intake: You may take clear liquids such as water up to 2 hours prior to your procedure. (No carbonated drinks. No soda.) Transportation: Unless otherwise stated by your physician, bring a driver. Morning Medicines: Except for blood thinners, take all of your other morning medications with a sip of water. Make sure to take your heart and blood pressure medicines. If your blood pressure's lower number is above 100, the case will be rescheduled. Blood thinners: Make sure to stop your blood thinners as instructed.  If you take  a blood thinner, but were not instructed to stop it, call our office [phone] and ask to talk to a nurse. Not stopping a blood thinner prior to certain procedures could lead to serious complications. Diabetics on insulin: Notify the staff so that you can be scheduled 1st case in the morning. If your diabetes requires high dose insulin, take only  of your normal insulin dose the morning of the procedure and notify the staff that you have done so. Preventing infections: Shower with an antibacterial soap the morning of your procedure.  Build-up your immune system: Take 1000 mg of Vitamin C with every meal (3 times a day) the day prior to your procedure. Antibiotics: Inform the nursing staff if you are taking any antibiotics or if you have any conditions that may require antibiotics prior to procedures. (Example: recent joint implants)   Pregnancy: If you are pregnant make sure to notify the nursing staff. Not doing so may result in injury to the fetus, including death.  Sickness: If you have a cold, fever, or any active infections, call and cancel or reschedule your procedure. Receiving steroids while having an infection may result in complications. Arrival: You must be in the facility at least 30 minutes prior to your scheduled procedure. Tardiness: Your scheduled time is also the cutoff time. If you do not arrive at least 15 minutes prior to your procedure, you will be rescheduled.  Children: Do not bring any children with you. Make arrangements to keep them home. Dress appropriately: There is always a possibility that your clothing may get soiled. Avoid long dresses. Valuables: Do not bring any jewelry or valuables.  Reasons to call and reschedule or cancel your procedure: (Following these recommendations will minimize the risk  of a serious complication.) Surgeries: Avoid having procedures within 2 weeks of any surgery. (Avoid for 2 weeks before or after any surgery). Flu Shots: Avoid having  procedures within 2 weeks of a flu shots or . (Avoid for 2 weeks before or after immunizations). Barium: Avoid having a procedure within 7-10 days after having had a radiological study involving the use of radiological contrast. (Myelograms, Barium swallow or enema study). Heart attacks: Avoid any elective procedures or surgeries for the initial 6 months after a "Myocardial Infarction" (Heart Attack). Blood thinners: It is imperative that you stop these medications before procedures. Let us know if you if you take any blood thinner.  Infection: Avoid procedures during or within two weeks of an infection (including chest colds or gastrointestinal problems). Symptoms associated with infections include: Localized redness, fever, chills, night sweats or profuse sweating, burning sensation when voiding, cough, congestion, stuffiness, runny nose, sore throat, diarrhea, nausea, vomiting, cold or Flu symptoms, recent or current infections. It is specially important if the infection is over the area that we intend to treat. Heart and lung problems: Symptoms that may suggest an active cardiopulmonary problem include: cough, chest pain, breathing difficulties or shortness of breath, dizziness, ankle swelling, uncontrolled high or unusually low blood pressure, and/or palpitations. If you are experiencing any of these symptoms, cancel your procedure and contact your primary care physician for an evaluation.  Remember:  Regular Business hours are:  Monday to Thursday 8:00 AM to 4:00 PM  Provider's Schedule: Milinda Pointer, MD:  Procedure days: Tuesday and Thursday 7:30 AM to 4:00 PM  Gillis Santa, MD:  Procedure days: Monday and Wednesday 7:30 AM to 4:00 PM  ______________________________________________________________________   Epidural Steroid Injection Patient Information  Description: The epidural space surrounds the nerves as they exit the spinal cord.  In some patients, the nerves can be  compressed and inflamed by a bulging disc or a tight spinal canal (spinal stenosis).  By injecting steroids into the epidural space, we can bring irritated nerves into direct contact with a potentially helpful medication.  These steroids act directly on the irritated nerves and can reduce swelling and inflammation which often leads to decreased pain.  Epidural steroids may be injected anywhere along the spine and from the neck to the low back depending upon the location of your pain.   After numbing the skin with local anesthetic (like Novocaine), a small needle is passed into the epidural space slowly.  You may experience a sensation of pressure while this is being done.  The entire block usually last less than 10 minutes.  Conditions which may be treated by epidural steroids:  Low back and leg pain Neck and arm pain Spinal stenosis Post-laminectomy syndrome Herpes zoster (shingles) pain Pain from compression fractures  Preparation for the injection:  Do not eat any solid food or dairy products within 8 hours of your appointment.  You may drink clear liquids up to 3 hours before appointment.  Clear liquids include water, black coffee, juice or soda.  No milk or cream please. You may take your regular medication, including pain medications, with a sip of water before your appointment  Diabetics should hold regular insulin (if taken separately) and take 1/2 normal NPH dos the morning of the procedure.  Carry some sugar containing items with you to your appointment. A driver must accompany you and be prepared to drive you home after your procedure.  Bring all your current medications with your. An IV may be inserted and  sedation may be given at the discretion of the physician.   A blood pressure cuff, EKG and other monitors will often be applied during the procedure.  Some patients may need to have extra oxygen administered for a short period. You will be asked to provide medical information,  including your allergies, prior to the procedure.  We must know immediately if you are taking blood thinners (like Coumadin/Warfarin)  Or if you are allergic to IV iodine contrast (dye). We must know if you could possible be pregnant.  Possible side-effects: Bleeding from needle site Infection (rare, may require surgery) Nerve injury (rare) Numbness & tingling (temporary) Difficulty urinating (rare, temporary) Spinal headache ( a headache worse with upright posture) Light -headedness (temporary) Pain at injection site (several days) Decreased blood pressure (temporary) Weakness in arm/leg (temporary) Pressure sensation in back/neck (temporary)  Call if you experience: Fever/chills associated with headache or increased back/neck pain. Headache worsened by an upright position. New onset weakness or numbness of an extremity below the injection site Hives or difficulty breathing (go to the emergency room) Inflammation or drainage at the infection site Severe back/neck pain Any new symptoms which are concerning to you  Please note:  Although the local anesthetic injected can often make your back or neck feel good for several hours after the injection, the pain will likely return.  It takes 3-7 days for steroids to work in the epidural space.  You may not notice any pain relief for at least that one week.  If effective, we will often do a series of three injections spaced 3-6 weeks apart to maximally decrease your pain.  After the initial series, we generally will wait several months before considering a repeat injection of the same type.  If you have any questions, please call [phone] [facility]

## 2022-05-12 NOTE — Progress Notes (Signed)
PROVIDER NOTE: Information contained herein reflects review and annotations entered in association with encounter. Interpretation of such information and data should be left to medically-trained personnel. Information provided to patient can be located elsewhere in the medical record under "Patient Instructions". Document created using STT-dictation technology, any transcriptional errors that may result from process are unintentional.    Patient: Danielle Stephens  Service Category: E/M  Provider: Gillis Santa, MD  DOB: 06-20-1950  DOS: 05/12/2022  Referring Provider: Venita Lick, NP  MRN: [id number]  Specialty: Interventional Pain Management  PCP: Venita Lick, NP  Type: Established Patient  Setting: Ambulatory outpatient    Location: Office  Delivery: Face-to-face     HPI  [name], a 72 y.o. year old female, is here today because of her Cervical radicular pain [M54.12]. [name]'s primary complain today is Shoulder Pain (Right s/p surgical repair of rotator cuff and then reverse shoulder replacement.  Incisional type pain starting at clavicle bone ) Last encounter: My last encounter with her was on 03/04/2022. Pertinent problems: [name] has H/O repair of right rotator cuff; Chronic right shoulder pain; History of right shoulder replacement; Right rotator cuff tear arthropathy; Chronic pain syndrome; and Suprascapular entrapment neuropathy of right side on their pertinent problem list. Pain Assessment: Severity of Chronic pain is reported as a 7 /10. Location: Shoulder Right/into neck and down the arm to the elbow, at night radiates over to the other shoulder. Onset: More than a month ago. Quality: Discomfort, Throbbing, Constant. Timing: Constant. Modifying factor(s): muscle relaxer, currently out of gabapentin x 10 days.. Vitals:  height is '5\' 2"'$  (1.575 m) and weight is 106 lb (48.1 kg). Her temporal temperature is 99.1 F (37.3 C). Her blood pressure is 121/59 (abnormal) and her pulse is  77. Her respiration is 16 and oxygen saturation is 100%.  BMI: Estimated body mass index is 19.39 kg/m as calculated from the following:   Height as of this encounter: '5\' 2"'$  (1.575 m).   Weight as of this encounter: 106 lb (48.1 kg).  Reason for encounter: patient-requested evaluation.   Patient presents today for persistent right shoulder pain with radiation down her bicep into her forearm.  She was evaluated by orthopedic surgery in [place].  She was recommended to have a cervical epidural steroid injection based upon recent cervical MRI results as well as a consult with neurology for nerve conduction velocity/EMG study.  [name] expresses that she would like to have her cervical epidural steroid injection done here.  She has been seeing me for quite some time and we focused on comprehensive pain management including interventional treatments via suprascapular nerve blocks and also medication management.  She recently returned from a mission trip and believes that greater activity and exertion exacerbated her right shoulder pain.  She has done physical therapy in the past for this and tries to do exercises for her right shoulder at home.  Pharmacotherapy Assessment  Analgesic:  Tramadol 50 mg daily prn severe breakthrough pain    Monitoring: [place] PMP: PDMP reviewed during this encounter.       Pharmacotherapy: No side-effects or adverse reactions reported. Compliance: No problems identified. Effectiveness: Clinically acceptable.  [name], RN  05/12/2022 11:30 AM  Sign when Signing Visit Nursing Pain Medication Assessment:  Safety precautions to be maintained throughout the outpatient stay will include: orient to surroundings, keep bed in low position, maintain call bell within reach at all times, provide assistance with transfer out of bed  and ambulation.  Medication Inspection Compliance: Pill count conducted under aseptic conditions, in front of the patient. Neither the pills nor  the bottle was removed from the patient's sight at any time. Once count was completed pills were immediately returned to the patient in their original bottle.  Medication #1: Hydrocodone/APAP Pill/Patch Count:  18 of 30 pills remain Pill/Patch Appearance: Markings consistent with prescribed medication Bottle Appearance: Standard pharmacy container. Clearly labeled. Filled Date: 08 / 15 / 2023 Last Medication intake:   has not taken in a long time.   Medication #2: Tramadol (Ultram) Pill/Patch Count:  55 of 60 pills remain Pill/Patch Appearance: Markings consistent with prescribed medication Bottle Appearance: Standard pharmacy container. Clearly labeled. Filled Date: 83 / 10 / 2023 Last Medication intake:   has not taken in a long time.     No results found for: "CBDTHCR" No results found for: "D8THCCBX" No results found for: "D9THCCBX"  UDS:  Summary  Date Value Ref Range Status  12/23/2020 Note  Final    Comment:    ==================================================================== Compliance Drug Analysis, Ur ==================================================================== Test                             Result       Flag       Units  Drug Present and Declared for Prescription Verification   Tramadol                       290          EXPECTED   ng/mg creat   O-Desmethyltramadol            500          EXPECTED   ng/mg creat    Source of tramadol is a prescription medication. O-desmethyltramadol    is an expected metabolite of tramadol.    Acetaminophen                  PRESENT      EXPECTED  Drug Present not Declared for Prescription Verification   Diphenhydramine                PRESENT      UNEXPECTED  Drug Absent but Declared for Prescription Verification   Oxycodone                      Not Detected UNEXPECTED ng/mg creat   Tizanidine                     Not Detected UNEXPECTED    Tizanidine, as indicated in the declared medication list, is not    always  detected even when used as directed.    Methocarbamol                  Not Detected UNEXPECTED   Doxylamine                     Not Detected UNEXPECTED ==================================================================== Test                      Result    Flag   Units      Ref Range   Creatinine              30               mg/dL      >=  20 ==================================================================== Declared Medications:  The flagging and interpretation on this report are based on the  following declared medications.  Unexpected results may arise from  inaccuracies in the declared medications.   **Note: The testing scope of this panel includes these medications:   Doxylamine  Methocarbamol (Robaxin)  Oxycodone (Roxicodone)  Tramadol (Ultram)   **Note: The testing scope of this panel does not include small to  moderate amounts of these reported medications:   Acetaminophen (Tylenol)  Tizanidine (Zanaflex)   **Note: The testing scope of this panel does not include the  following reported medications:   Biotin  Fexofenadine (Allegra)  Lisinopril (Zestril)  Melatonin  Meloxicam (Mobic)  Multivitamin  Ondansetron (Zofran)  Vitamin C  Vitamin D ==================================================================== For clinical consultation, please call [phone]. ====================================================================       ROS  Constitutional: Denies any fever or chills Gastrointestinal: No reported hemesis, hematochezia, vomiting, or acute GI distress Musculoskeletal: Denies any acute onset joint swelling, redness, loss of ROM, or weakness Neurological:  Right shoulder, right arm pain  Medication Review  Vitamin D, amLODipine, gabapentin, lactase, lisinopril, methocarbamol, multivitamin, ondansetron, and rosuvastatin  History Review  Allergy: Ms. Bupp is allergic to lactose intolerance (gi) and codeine. Drug: Ms. Vater  reports no history of  drug use. Alcohol:  reports no history of alcohol use. Tobacco:  reports that she has never smoked. She has never used smokeless tobacco. Social: Ms. Vangorder  reports that she has never smoked. She has never used smokeless tobacco. She reports that she does not drink alcohol and does not use drugs. Medical:  has a past medical history of Anxiety, Chronic kidney disease, Hypertension, and Shoulder pain. Surgical: Ms. Boskovich  has a past surgical history that includes Dilation and curettage, diagnostic / therapeutic; rotary cuff (Right, 03/06/2019); Shoulder arthroscopy (Right, 08/28/2019); Closed manipulation shoulder with steroid injection (Right, 08/28/2019); Colonoscopy; Reverse shoulder arthroplasty (Right, 05/20/2020); Colonoscopy with propofol (N/A, 09/23/2021); and polypectomy (N/A, 09/23/2021). Family: family history includes Cancer in her mother; Down syndrome in her brother; Heart attack in her brother and father; Hypertension in her father, mother, sister, sister, sister, sister, sister, and sister; Stroke in her sister.  Laboratory Chemistry Profile   Renal Lab Results  Component Value Date   BUN 14 02/03/2022   CREATININE 0.89 02/03/2022   BCR 16 02/03/2022   GFRAA 71 02/16/2020   GFRNONAA >60 05/16/2020    Hepatic Lab Results  Component Value Date   AST 26 02/03/2022   ALT 14 02/03/2022   ALBUMIN 4.2 02/03/2022   ALKPHOS 66 02/03/2022    Electrolytes Lab Results  Component Value Date   NA 144 02/03/2022   K 4.2 02/03/2022   CL 104 02/03/2022   CALCIUM 9.4 02/03/2022    Bone Lab Results  Component Value Date   VD25OH 36.5 02/03/2022    Inflammation (CRP: Acute Phase) (ESR: Chronic Phase) Lab Results  Component Value Date   CRP <1 04/14/2021   ESRSEDRATE 8 04/14/2021         Note: Above Lab results reviewed.  Recent Imaging Review  MR Cervical Spine w/o contrast CLINICAL DATA:  Right arm radiculopathy  EXAM: MRI CERVICAL SPINE WITHOUT  CONTRAST  TECHNIQUE: Multiplanar, multisequence MR imaging of the cervical spine was performed. No intravenous contrast was administered.  COMPARISON:  Cervical spine radiographs 02/04/2022  FINDINGS: Alignment: Normal  Vertebrae: Negative for fracture or mass  Cord: Normal signal and morphology  Posterior Fossa, vertebral arteries, paraspinal tissues: No significant abnormality. Small bilateral  perineural cysts around the nerve roots at C5-6, C6-7, C7-T1.  Disc levels:  C2-3: Negative  C3-4: Negative  C4-5: Mild disc and mild facet degeneration. Mild central canal stenosis and mild foraminal stenosis on the left.  C4-5: Disc degeneration with diffuse uncinate spurring. Right paracentral disc protrusion and spurring with cord flattening on the right. Mild spinal stenosis. Neural foramina patent bilaterally  C5-6: Disc degeneration with mild spurring. No significant spinal or foraminal stenosis  C7-T1: Negative  IMPRESSION: 1. Mild central canal stenosis and mild foraminal stenosis on the left at C4-5. 2. Right paracentral disc protrusion and spurring at C5-6 with cord flattening on the right.  Electronically Signed   By: Franchot Gallo M.D.   On: 03/25/2022 11:43 Note: Reviewed        Physical Exam  General appearance: Well nourished, well developed, and well hydrated. In no apparent acute distress Mental status: Alert, oriented x 3 (person, place, & time)       Respiratory: No evidence of acute respiratory distress Eyes: PERLA Vitals: BP (!) 121/59 (BP Location: Left Arm, Patient Position: Sitting, Cuff Size: Normal)   Pulse 77   Temp 99.1 F (37.3 C) (Temporal)   Resp 16   Ht '5\' 2"'$  (1.575 m)   Wt 106 lb (48.1 kg)   SpO2 100%   BMI 19.39 kg/m  BMI: Estimated body mass index is 19.39 kg/m as calculated from the following:   Height as of this encounter: '5\' 2"'$  (1.575 m).   Weight as of this encounter: 106 lb (48.1 kg). Ideal: Ideal body weight: 50.1 kg  (110 lb 7.2 oz)  Cervical Spine Area Exam  Skin & Axial Inspection: No masses, redness, edema, swelling, or associated skin lesions Alignment: Symmetrical Functional ROM: Unrestricted ROM      Stability: No instability detected Muscle Tone/Strength: Functionally intact. No obvious neuro-muscular anomalies detected. Sensory (Neurological): Referred pain pattern Palpation: No palpable anomalies             Upper Extremity (UE) Exam    Side: Right upper extremity  Side: Left upper extremity  Skin & Extremity Inspection: Skin color, temperature, and hair growth are WNL. No peripheral edema or cyanosis. No masses, redness, swelling, asymmetry, or associated skin lesions. No contractures.  Skin & Extremity Inspection: Skin color, temperature, and hair growth are WNL. No peripheral edema or cyanosis. No masses, redness, swelling, asymmetry, or associated skin lesions. No contractures.  Functional ROM: Pain restricted ROM for shoulder and elbow  Functional ROM: Unrestricted ROM          Muscle Tone/Strength: Functionally intact. No obvious neuro-muscular anomalies detected.  Muscle Tone/Strength: Functionally intact. No obvious neuro-muscular anomalies detected.  Sensory (Neurological): Neurogenic pain pattern          Sensory (Neurological): Unimpaired          Palpation: No palpable anomalies              Palpation: No palpable anomalies              Provocative Test(s):  Phalen's test: deferred Tinel's test: deferred Apley's scratch test (touch opposite shoulder):  Action 1 (Across chest): Decreased ROM Action 2 (Overhead): Decreased ROM Action 3 (LB reach): Decreased ROM   Provocative Test(s):  Phalen's test: deferred Tinel's test: deferred Apley's scratch test (touch opposite shoulder):  Action 1 (Across chest): deferred Action 2 (Overhead): deferred Action 3 (LB reach): deferred     Assessment   Diagnosis Status  1. Cervical radicular pain  2. Suprascapular entrapment neuropathy  of right side   3. Chronic right shoulder pain   4. Chronic left shoulder pain   5. History of right shoulder replacement   6. Herniation of cervical intervertebral disc with radiculopathy    Having a Flare-up Having a Flare-up Having a Flare-up   Updated Problems: Problem  Cervical Radicular Pain    Plan of Care    Orders:  Orders Placed This Encounter  Procedures   Cervical Epidural Injection    Sedation: Patient's choice. Purpose: Diagnostic/Therapeutic Indication(s): Radiculitis and cervicalgia associater with cervical degenerative disc disease.    Standing Status:   Future    Standing Expiration Date:   08/10/2022    Scheduling Instructions:     Procedure: Cervical Epidural Steroid Injection/Block     Level(s): C7-T1     Laterality: RIGHT     Timeframe: As soon as schedule allows    Order Specific Question:   Where will this procedure be performed?    Answer:   [facility]    Comments:   Holley Raring  Continue with neurology visit for nerve conduction velocity/EMG study.   Follow-up plan:   Return in about 1 day (around 05/13/2022) for Right C-ESI, in clinic NS.      Right SSNB 01/06/21, 08/06/21 , 10/08/21, 01/21/22       Recent Visits Date Type Provider Dept  03/04/22 Office Visit Gillis Santa, MD [facility]  Showing recent visits within past 90 days and meeting all other requirements Today's Visits Date Type Provider Dept  05/12/22 Office Visit Gillis Santa, MD [facility]  Showing today's visits and meeting all other requirements Future Appointments No visits were found meeting these conditions. Showing future appointments within next 90 days and meeting all other requirements  I discussed the assessment and treatment plan with the patient. The patient was provided an opportunity to ask questions and all were answered. The patient agreed with the plan and demonstrated an understanding of the instructions.  Patient advised to  call back or seek an in-person evaluation if the symptoms or condition worsens.  Duration of encounter: 41mnutes.  Total time on encounter, as per AMA guidelines included both the face-to-face and non-face-to-face time personally spent by the physician and/or other qualified health care professional(s) on the day of the encounter (includes time in activities that require the physician or other qualified health care professional and does not include time in activities normally performed by clinical staff). Physician's time may include the following activities when performed: Preparing to see the patient (e.g., pre-charting review of records, searching for previously ordered imaging, lab work, and nerve conduction tests) Review of prior analgesic pharmacotherapies. Reviewing PMP Interpreting ordered tests (e.g., lab work, imaging, nerve conduction tests) Performing post-procedure evaluations, including interpretation of diagnostic procedures Obtaining and/or reviewing separately obtained history Performing a medically appropriate examination and/or evaluation Counseling and educating the patient/family/caregiver Ordering medications, tests, or procedures Referring and communicating with other health care professionals (when not separately reported) Documenting clinical information in the electronic or other health record Independently interpreting results (not separately reported) and communicating results to the patient/ family/caregiver Care coordination (not separately reported)  Note by: BGillis Santa MD Date: 05/12/2022; Time: 2:58 PM

## 2022-05-12 NOTE — Progress Notes (Deleted)
Safety precautions to be maintained throughout the outpatient stay will include: orient to surroundings, keep bed in low position, maintain call bell within reach at all times, provide assistance with transfer out of bed and ambulation.  

## 2022-05-12 NOTE — Progress Notes (Signed)
Nursing Pain Medication Assessment:  Safety precautions to be maintained throughout the outpatient stay will include: orient to surroundings, keep bed in low position, maintain call bell within reach at all times, provide assistance with transfer out of bed and ambulation.  Medication Inspection Compliance: Pill count conducted under aseptic conditions, in front of the patient. Neither the pills nor the bottle was removed from the patient's sight at any time. Once count was completed pills were immediately returned to the patient in their original bottle.  Medication #1: Hydrocodone/APAP Pill/Patch Count:  18 of 30 pills remain Pill/Patch Appearance: Markings consistent with prescribed medication Bottle Appearance: Standard pharmacy container. Clearly labeled. Filled Date: 08 / 15 / 2023 Last Medication intake:   has not taken in a long time.   Medication #2: Tramadol (Ultram) Pill/Patch Count:  55 of 60 pills remain Pill/Patch Appearance: Markings consistent with prescribed medication Bottle Appearance: Standard pharmacy container. Clearly labeled. Filled Date: 29 / 10 / 2023 Last Medication intake:   has not taken in a long time.

## 2022-05-13 ENCOUNTER — Encounter: Payer: 59 | Admitting: Physical Medicine and Rehabilitation

## 2022-05-14 ENCOUNTER — Ambulatory Visit (INDEPENDENT_AMBULATORY_CARE_PROVIDER_SITE_OTHER): Payer: 59 | Admitting: Neurology

## 2022-05-14 DIAGNOSIS — R202 Paresthesia of skin: Secondary | ICD-10-CM | POA: Diagnosis not present

## 2022-05-14 NOTE — Procedures (Signed)
  [facility]  [address], [address]  [place], [place] [address] Tel: [phone] Fax: [phone] Test Date:  05/14/2022  Patient: Danielle Stephens DOB: [birth year] Physician: Narda Amber, DO  Sex: Female Height: 5' 2"$  Ref Phys: Ileene Rubens, MD  ID#: [id number]   Technician:    History: This is a 72 year old female referred for evaluation of right arm pain and paresthesias.  NCV & EMG Findings: Extensive electrodiagnostic testing of the right upper extremity shows:  Right median, ulnar, and mixed palmar sensory responses are within normal limits. Right median and ulnar motor responses are within normal limits. Sparse chronic motor axon loss changes are seen affecting the right pronator teres and biceps muscles, without accompanying active denervation.  Impression: Chronic C6 radiculopathy affecting the right upper extremity, very mild. There is no evidence of carpal tunnel syndrome or ulnar neuropathy affecting the right upper extremity.   ___________________________ Narda Amber, DO    Nerve Conduction Studies   Stim Site NR Peak (ms) Norm Peak (ms) O-P Amp (V) Norm O-P Amp  Right Median Anti Sensory (2nd Digit)  32 C  Wrist    3.1 <3.8 37.7 >10  Right Ulnar Anti Sensory (5th Digit)  32 C  Wrist    3.0 <3.2 34.6 >5     Stim Site NR Onset (ms) Norm Onset (ms) O-P Amp (mV) Norm O-P Amp Site1 Site2 Delta-0 (ms) Dist (cm) Vel (m/s) Norm Vel (m/s)  Right Median Motor (Abd Poll Brev)  32 C  Wrist    3.0 <4.0 10.0 >5 Elbow Wrist 5.0 28.0 56 >50  Elbow    8.0  9.7         Right Ulnar Motor (Abd Dig Minimi)  32 C  Wrist    2.8 <3.1 9.5 >7 B Elbow Wrist 3.3 20.0 61 >50  B Elbow    6.1  8.9  A Elbow B Elbow 1.6 10.0 62 >50  A Elbow    7.7  8.6            Stim Site NR Peak (ms) Norm Peak (ms) P-T Amp (V) Site1 Site2 Delta-P (ms) Norm Delta (ms)  Right Median/Ulnar Palm Comparison (Wrist - 8cm)  32 C  Median Palm    1.7 <2.2 42.1 Median Palm Ulnar Palm 0.2    Ulnar Palm    1.5 <2.2 21.9       Electromyography   Side Muscle Ins.Act Fibs Fasc Recrt Amp Dur Poly Activation Comment  Right 1stDorInt Nml Nml Nml Nml Nml Nml Nml Nml N/A  Right Abd Poll Brev Nml Nml Nml Nml Nml Nml Nml Nml N/A  Right PronatorTeres Nml Nml Nml *1- *1+ *1+ *1+ Nml N/A  Right Biceps Nml Nml Nml *1- *1+ *1+ *1+ Nml N/A  Right Triceps Nml Nml Nml Nml Nml Nml Nml Nml N/A  Right Deltoid Nml Nml Nml Nml Nml Nml Nml Nml N/A      Waveforms:

## 2022-05-16 IMAGING — CT CT SHOULDER*R* W/O CM
2 series · 10 of 14 positions shown, 11 images · non-contrast
Comparison: Radiographs 05/20/2020.  CT 05/07/2020.

CLINICAL DATA: Right shoulder pain post shoulder arthroplasty.
History of surgery x3. No recent injury.

EXAM:
CT OF THE UPPER RIGHT EXTREMITY WITHOUT CONTRAST
TECHNIQUE: Multidetector CT imaging of the right shoulder was performed
according to the standard protocol.

[Series 2: thin st for cd · axial · 0.41mm/px · z∈[-619,-462]mm · 8 of 506 slices shown]
[im 57/506  bone]
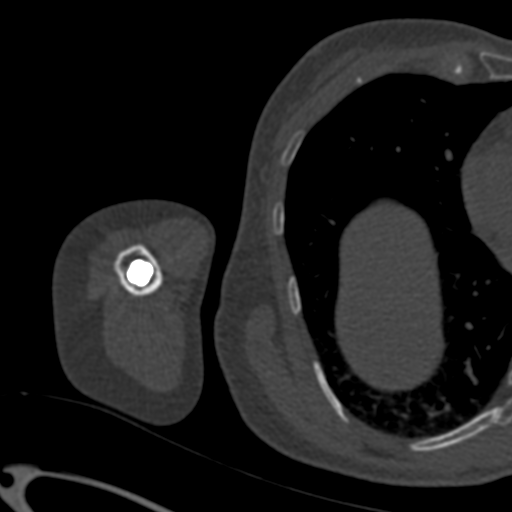
[im 113/506  bone]
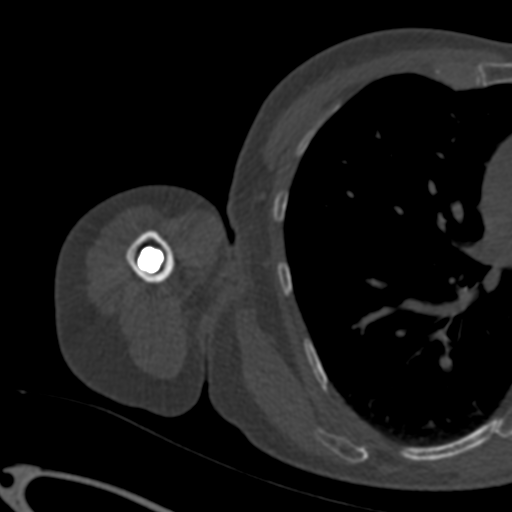
[im 169/506  bone]
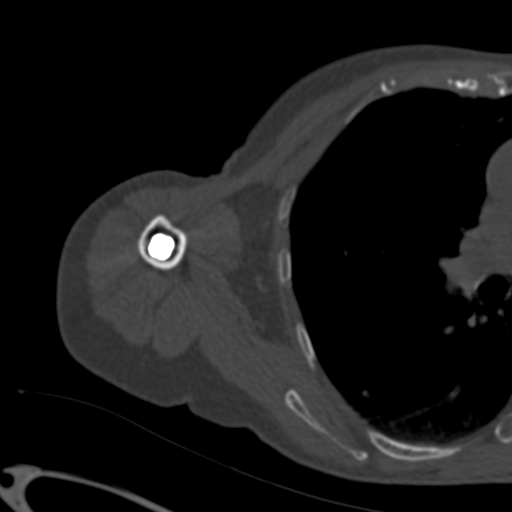
[im 225/506  bone]
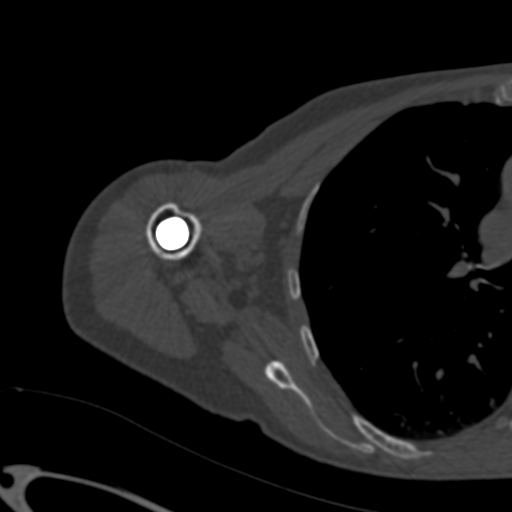
[im 281/506  bone]
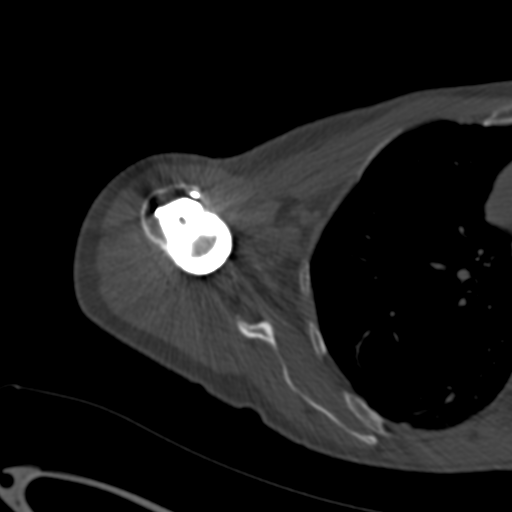
[im 337/506  bone]
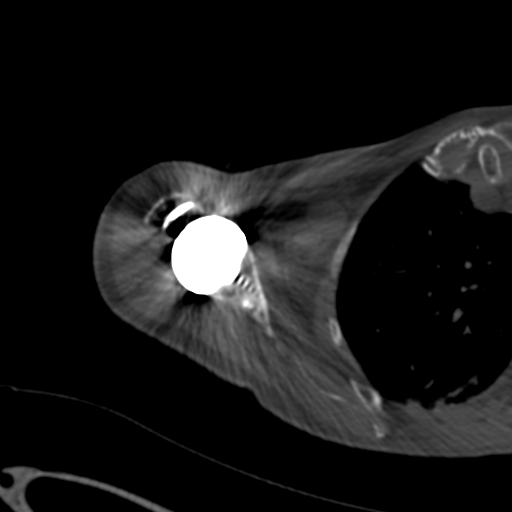
[im 393/506  bone]
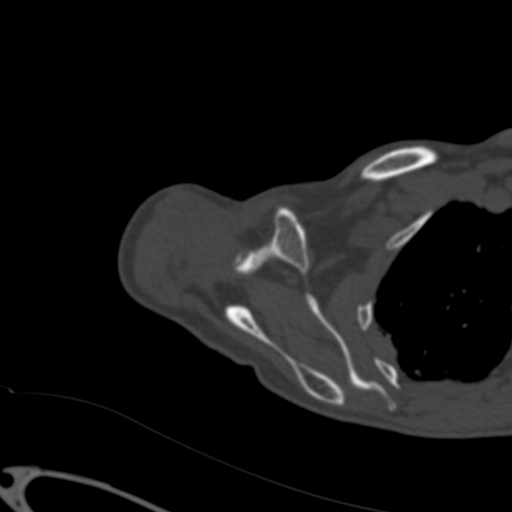
[im 449/506  bone]
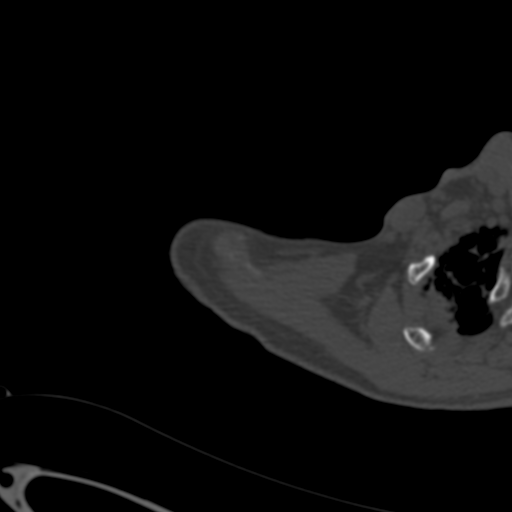

[Series 7: ax st · axial · 0.25mm/px · z∈[-586,-521]mm · 2 of 202 slices shown, 3 images]
[im 68/202  soft-tissue]
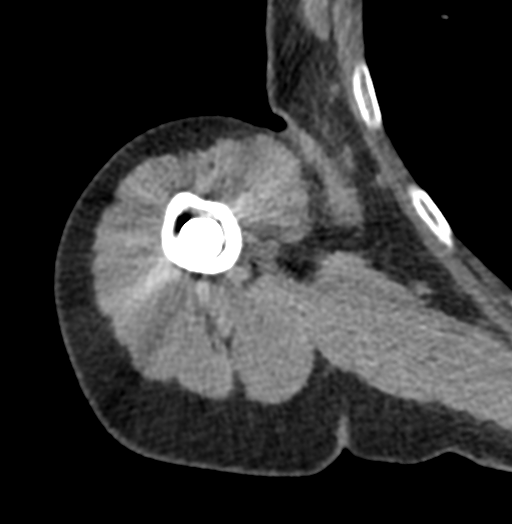
[im 68/202  bone]
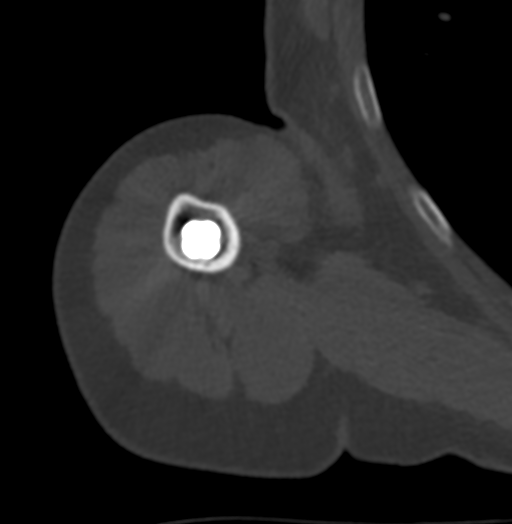
[im 135/202  bone]
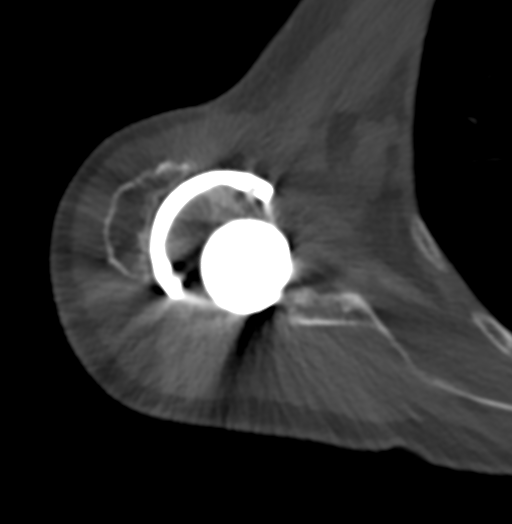

[10 of 14 positions shown; findings below may reference images not displayed]

FINDINGS: Bones/Joint/Cartilage

Since the prior CT, the patient has undergone interval right
shoulder reverse arthroplasty. There is associated beam hardening
artifact. The hardware appears intact without loosening. The central
glenoid screw protrudes slightly beyond the medial cortex of the
scapular body. There is no evidence of acute fracture or
dislocation.

No significant shoulder joint effusion identified. There are stable
postsurgical changes at the acromioclavicular joint.

Ligaments

Suboptimally assessed by CT.

Muscles and Tendons

New fatty atrophy of the subscapularis muscle. No other focal
rotator cuff muscular atrophy identified.

Soft tissues

No periarticular fluid collection or unexpected foreign body
identified. Right lung apical scarring and a 3 mm right lower lobe
pulmonary nodule (image 335/3) are unchanged.
IMPRESSION: 1. Interval right shoulder reverse arthroplasty without evidence of
complication. The hardware appears intact without loosening.
2. There does appear to be mild subscapularis muscular atrophy.
3. Stable postsurgical changes at the acromioclavicular joint.
4. Stable 3 mm right lower lobe pulmonary nodule from previous CT of
7 months, supporting a benign etiology. This appearance is almost
certainly benign, and no dedicated follow-up is required if this
patient is low risk for bronchogenic carcinoma (and has no known or
suspected primary neoplasm). Non-contrast chest CT can be considered
in 12 months if patient is high-risk. This recommendation follows
the consensus statement: Guidelines for Management of Incidental
Pulmonary Nodules Detected on CT Images: From the [HOSPITAL]

## 2022-05-21 ENCOUNTER — Ambulatory Visit (INDEPENDENT_AMBULATORY_CARE_PROVIDER_SITE_OTHER): Payer: 59 | Admitting: Orthopedic Surgery

## 2022-05-21 DIAGNOSIS — M5412 Radiculopathy, cervical region: Secondary | ICD-10-CM | POA: Diagnosis not present

## 2022-05-21 NOTE — Progress Notes (Signed)
Pre-operative Scores  NDI: 56 VAS neck: 7 VAS arm: 9 SF-36:  -Physical functioning: 25  -Role limitations due to physical health: 0  -Role limitations due to emotional problems: 33.3  -Energy/fatigue: 40  -Emotional well-being: 19  -Social functioning: 37.5  -Pain: 22.5  -General health: 78  Callie Fielding, MD Orthopedic Surgeon

## 2022-05-21 NOTE — Progress Notes (Signed)
Orthopedic Spine Surgery Office Note  Assessment: Patient is a 72 y.o. female with neck pain that radiates into her right shoulder, right forearm and radial aspect of the hand.  Seems to be consistent with a cervical radiculopathy, particularly C6.  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    Plan: -Explained that initially conservative treatment is tried as a significant number of patients may experience relief with these treatment modalities. Discussed that the conservative treatments include:  -activity modification  -physical therapy  -over the counter pain medications  -medrol dosepak  -cervical steroid injections -Patient has tried  physical therapy, activity modification, narcotics, muscle relaxer, suprascapular steroid injections, pain management, shoulder surgery x 3  -Her MRI, EMG/NCS, and improvement with steroid injection all point to her cervical spine as etiology of her pain.  So, I discussed operative management as a treatment option for her.  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 -Explained that improvement in her shoulder range of motion after surgery is not an expected benefit of the proposed surgery -Patient will next be seen at date of surgery   The patient has cervical radiculopathy, which was initially treated with non-operative measures. The patient's symptoms failed to improve with conservative treatments, so operative management was discussed in the form of C4-6 anterior cervical discectomy and fusion. The risks, including but not limited to pseudarthrosis, dysphagia, hematoma, airway compromise, recurrent laryngeal nerve injury, esophageal perforation, durotomy, spinal cord injury, nerve root injury, persistent pain, adjacent segment disease, infection, bleeding, hardware failure, vascular injury, heart attack, death, stroke, fracture, and need for additional procedures were discussed with the patient. The benefit of  surgery would be improvement in the patient's right arm pain. Explained that the patient may not get full relief of their pain with this surgery, especially any neck pain. The alternatives to surgical management would be continued monitoring, physical therapy, over-the-counter pain medications, injections, traction, pain management, and activity modification. All the patient's questions were answered to their satisfaction. After this discussion, the patient expressed understanding and elected to proceed with surgical intervention.     __________________________________________________________________________  History: Patient is a 72 y.o. female who has been previously seen in the office for symptoms consistent with cervical radiculopathy.  Patient has neck pain that radiates into her right shoulder, right lateral arm, radial forearm, and radial aspect of her hand.  Pain is still felt on a daily basis.  She has not had any changes since last time I saw her.  She did get her EMG/NCS done.  No symptoms on the left side.  Still gets periodic paresthesias down the right arm in the same distribution as the pain.  No other numbness or paresthesias.  No bowel or bladder incontinence.  No saddle anesthesia.  Previous treatments:  physical therapy, activity modification, narcotics, muscle relaxer, suprascapular steroid injections, pain management, shoulder surgery x 3    Physical Exam:  General: no acute distress, appears stated age Neurologic: alert, answering questions appropriately, following commands Respiratory: unlabored breathing on room air, symmetric chest rise Psychiatric: appropriate affect, normal cadence to speech   MSK (spine):  -Strength exam      Left  Right Grip strength               5/5  5/5 Interosseus   5/5   5/5 Wrist extension  5/5  5/5 Wrist flexion   5/5  5/5 Elbow flexion   5/5  5/5 Deltoid    5/5  4/5  EHL    5/5  5/5 TA    5/5  5/5 GSC    5/5  5/5 Knee extension   5/5  5/5 Hip flexion   5/5  5/5  -Sensory exam    Sensation intact to light touch in L3-S1 nerve distributions of bilateral lower extremities  Sensation intact to light touch in C5-T1 nerve distributions of bilateral upper extremities  -Brachioradialis DTR: 1/4 on the left, 1/4 on the right -Biceps DTR: 2/4 on the left, 2/4 on the right -Achilles DTR: 2/4 on the left, 2/4 on the right -Patellar tendon DTR: 2/4 on the left, 2/4 on the right  -Spurling: negative bilaterally -Hoffman sign: negative bilaterally -Clonus: no beats bilaterally -Interosseous wasting: none seen -Grip and release test: negative -Romberg: negative -Gait: normal -Imbalance with tandem gait: no  Left shoulder exam: no pain through range of motion, negative jobe test, no weakness with external rotation with arm and side, negative belly press Right shoulder exam: pain with external rotation past 60 degrees with arm at 90 degrees, no pain with internal rotation, decreased forward flexion and abduction, pain with jobe test, negative drop arm sign, some weakness with external rotation with arm at side, negative belly press  Tinel's at wrist: negative bilaterally Phalen's at wrist: negative bilaterally Durkan's: negative bilaterally   Tinel's at elbow: negative bilaterally  Imaging: XR of the cervical spine from 03/30/2022 and 02/04/2022 was previously independently reviewed and interpreted, showing 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 Disc height loss and anterior osteophyte formation at C5/6 and C6/7.    MRI of the cervical spine from 03/25/2022 was previously independently reviewed and interpreted, showing central stenosis at C4/5 and right-sided paracentral disc herniation at C5/6. Flattening of the cord on the right side at C5/6 posterior to the disc herniation.   EMG/NCS from 05/14/2022 shows evidence of chronic C6 radiculopathy in the right upper extremity.  No  evidence of carpal tunnel syndrome, cubital tunnel syndrome, or brachial plexopathy   Patient name: Danielle BINDAPatient MRN: [id number] of visit: 05/21/22

## 2022-05-22 ENCOUNTER — Telehealth: Payer: Self-pay

## 2022-05-22 NOTE — Telephone Encounter (Signed)
LVM for patient to schedule appt for surg clearance.

## 2022-05-25 ENCOUNTER — Ambulatory Visit
Admission: RE | Admit: 2022-05-25 | Discharge: 2022-05-25 | Disposition: A | Discharge status: Home / Self Care | Payer: 59 | Source: Ambulatory Visit | Attending: Student in an Organized Health Care Education/Training Program | Admitting: Student in an Organized Health Care Education/Training Program

## 2022-05-25 ENCOUNTER — Ambulatory Visit
Payer: 59 | Attending: Student in an Organized Health Care Education/Training Program | Admitting: Student in an Organized Health Care Education/Training Program

## 2022-05-25 ENCOUNTER — Encounter: Payer: Self-pay | Admitting: Student in an Organized Health Care Education/Training Program

## 2022-05-25 VITALS — BP 134/81 | HR 74 | Temp 98.6°F | Resp 18 | Ht 62.0 in | Wt 106.0 lb

## 2022-05-25 DIAGNOSIS — M5412 Radiculopathy, cervical region: Secondary | ICD-10-CM | POA: Diagnosis not present

## 2022-05-25 DIAGNOSIS — M5010 Cervical disc disorder with radiculopathy, unspecified cervical region: Secondary | ICD-10-CM | POA: Diagnosis not present

## 2022-05-25 DIAGNOSIS — G894 Chronic pain syndrome: Secondary | ICD-10-CM | POA: Diagnosis not present

## 2022-05-25 MED ORDER — SODIUM CHLORIDE 0.9% FLUSH
1.0000 mL | Freq: Once | INTRAVENOUS | Status: AC
  Administered 2022-05-25: 1 mL

## 2022-05-25 MED ORDER — LIDOCAINE HCL 2 % IJ SOLN
20.0000 mL | Freq: Once | INTRAMUSCULAR | Status: AC
  Administered 2022-05-25: 200 mg
  Filled 2022-05-25: qty 20

## 2022-05-25 MED ORDER — ROPIVACAINE HCL 2 MG/ML IJ SOLN
1.0000 mL | Freq: Once | INTRAMUSCULAR | Status: AC
  Administered 2022-05-25: 1 mL via EPIDURAL
  Filled 2022-05-25: qty 20

## 2022-05-25 MED ORDER — IOHEXOL 180 MG/ML  SOLN
10.0000 mL | Freq: Once | INTRAMUSCULAR | Status: AC
  Administered 2022-05-25: 10 mL via EPIDURAL
  Filled 2022-05-25: qty 20

## 2022-05-25 MED ORDER — DEXAMETHASONE SODIUM PHOSPHATE 10 MG/ML IJ SOLN
10.0000 mg | Freq: Once | INTRAMUSCULAR | Status: AC
  Administered 2022-05-25: 10 mg
  Filled 2022-05-25: qty 1

## 2022-05-25 NOTE — Patient Instructions (Signed)

## 2022-05-25 NOTE — Progress Notes (Signed)
PROVIDER NOTE: Interpretation of information contained herein should be left to medically-trained personnel. Specific patient instructions are provided elsewhere under "Patient Instructions" section of medical record. This document was created in part using STT-dictation technology, any transcriptional errors that may result from this process are unintentional.  Patient: Danielle Stephens [place] Type: Established DOB: 12/18/1950 MRN: [id number] PCP: Venita Lick, NP  Service: Procedure DOS: 05/25/2022 Setting: Ambulatory Location: Ambulatory outpatient facility Delivery: Face-to-face Provider: Gillis Santa, MD Specialty: Interventional Pain Management Specialty designation: 09 Location: Outpatient facility Ref. Prov.: Gillis Santa, MD       Interventional Therapy   Procedure: Cervical Epidural Steroid injection (CESI) (Interlaminar) #1  Laterality: Right  Level: C7-T1 Imaging: Fluoroscopy-assisted DOS: 05/25/2022  Performed by: Gillis Santa, MD Anesthesia: Local anesthesia (1-2% Lidocaine)  Purpose: Diagnostic/Therapeutic Indications: Cervicalgia, cervical radicular pain, degenerative disc disease, severe enough to impact quality of life or function. 1. Cervical radicular pain   2. Herniation of cervical intervertebral disc with radiculopathy   3. Chronic pain syndrome    NAS-11 score:   Pre-procedure: 8 /10   Post-procedure: 0-No pain/10      Position  Prep  Materials:  Location setting: Procedure suite Position: Prone, on modified reverse trendelenburg to facilitate breathing, with head in head-cradle. Pillows positioned under chest (below chin-level) with cervical spine flexed. Safety Precautions: Patient was assessed for positional comfort and pressure points before starting the procedure. Prepping solution: DuraPrep (Iodine Povacrylex [0.7% available iodine] and Isopropyl Alcohol, 74% w/w) Prep Area: Entire  cervicothoracic region Approach: percutaneous, paramedial Intended  target: Posterior cervical epidural space Materials Procedure:  Tray: Epidural Needle(s): Epidural (Tuohy) Qty: 1 Length: (33m) 3.5-inch Gauge: 22G   Pre-op H&P Assessment:  Ms. COderis a 72y.o. (year old), female patient, seen today for interventional treatment. 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 Ms. CHagertyhas a current medication list which includes the following prescription(s): amlodipine, vitamin d, gabapentin, lactase, lisinopril, methocarbamol, multivitamin, ondansetron, and rosuvastatin. Her primarily concern today is the Neck Pain  Initial Vital Signs:  Pulse/HCG Rate: 74ECG Heart Rate: 86 Temp: 98.6 F (37 C) Resp: 17 BP: (!) 128/58 SpO2: 100 %  BMI: Estimated body mass index is 19.39 kg/m as calculated from the following:   Height as of this encounter: '5\' 2"'$  (1.575 m).   Weight as of this encounter: 106 lb (48.1 kg).  Risk Assessment: Allergies: Reviewed. She is allergic to lactose intolerance (gi) and codeine.  Allergy Precautions: None required Coagulopathies: Reviewed. None identified.  Blood-thinner therapy: None at this time Active Infection(s): Reviewed. None identified. Ms. CDurreris afebrile  Site Confirmation: Ms. CLimeswas asked to confirm the procedure and laterality before marking the site Procedure checklist: Completed Consent: Before the procedure and under the influence of no sedative(s), amnesic(s), or anxiolytics, the patient was informed of the treatment options, risks and possible complications. To fulfill our ethical and legal obligations, as recommended by the American Medical Association's Code of Ethics, I have  informed the patient of my clinical impression; the nature and purpose of the treatment or procedure; the risks, benefits, and possible complications of the intervention; the alternatives, including doing nothing; the risk(s) and benefit(s) of the alternative treatment(s) or procedure(s); and the risk(s) and benefit(s) of doing nothing. The patient was provided  information about the general risks and possible complications associated with the procedure. These may include, but are not limited to: failure to achieve desired goals, infection, bleeding, organ or nerve damage, allergic reactions, paralysis, and death. In addition, the patient was informed of those risks and complications associated to Spine-related procedures, such as failure to decrease pain; infection (i.e.: Meningitis, epidural or intraspinal abscess); bleeding (i.e.: epidural hematoma, subarachnoid hemorrhage, or any other type of intraspinal or peri-dural bleeding); organ or nerve damage (i.e.: Any type of peripheral nerve, nerve root, or spinal cord injury) with subsequent damage to sensory, motor, and/or autonomic systems, resulting in permanent pain, numbness, and/or weakness of one or several areas of the body; allergic reactions; (i.e.: anaphylactic reaction); and/or death. Furthermore, the patient was informed of those risks and complications associated with the medications. These include, but are not limited to: allergic reactions (i.e.: anaphylactic or anaphylactoid reaction(s)); adrenal axis suppression; blood sugar elevation that in diabetics may result in ketoacidosis or comma; water retention that in patients with history of congestive heart failure may result in shortness of breath, pulmonary edema, and decompensation with resultant heart failure; weight gain; swelling or edema; medication-induced neural toxicity; particulate matter embolism and blood vessel occlusion with resultant organ, and/or nervous system infarction; and/or  aseptic necrosis of one or more joints. Finally, the patient was informed that Medicine is not an exact science; therefore, there is also the possibility of unforeseen or unpredictable risks and/or possible complications that may result in a catastrophic outcome. The patient indicated having understood very clearly. We have given the patient no guarantees and we have made no promises. Enough time was given to the patient to ask questions, all of which were answered to the patient's satisfaction. Ms. Javaid has indicated that she wanted to continue with the procedure. Attestation: I, the ordering provider, attest that I have discussed with the patient the benefits, risks, side-effects, alternatives, likelihood of achieving goals, and potential problems during recovery for the procedure that I have provided informed consent. Date  Time: 05/25/2022  1:49 PM   Pre-Procedure Preparation:  Monitoring: As per clinic protocol. Respiration, ETCO2, SpO2, BP, heart rate and rhythm monitor placed and checked for adequate function Safety Precautions: Patient was assessed for positional comfort and pressure points before starting the procedure. Time-out: I initiated and conducted the "Time-out" before starting the procedure, as per protocol. The patient was asked to participate by confirming the accuracy of the "Time Out" information. Verification of the correct person, site, and procedure were performed and confirmed by me, the nursing staff, and the patient. "Time-out" conducted as per Joint Commission's Universal Protocol (UP.01.01.01). Time: 1430  Description  Narrative of Procedure:          Rationale (medical necessity): procedure needed and proper for the diagnosis and/or treatment of the patient's medical symptoms and needs. Start Time: 1430 hrs. Safety Precautions: Aspiration looking for blood return was conducted prior to all injections. At no point did we inject any substances, as a needle was being  advanced. No attempts were made at seeking any paresthesias. Safe injection practices and needle disposal techniques used. Medications properly checked for expiration dates. SDV (single dose vial) medications used. Description of procedure: Protocol guidelines were followed. The patient was assisted into a comfortable position. The target area was identified and the area prepped in the usual manner. Skin & deeper tissues infiltrated with local anesthetic. Appropriate amount of time allowed to pass for local anesthetics to take effect. Using fluoroscopic guidance, the epidural  needle was introduced through the skin, ipsilateral to the reported pain, and advanced to the target area. Posterior laminar os was contacted and the needle walked caudad, until the lamina was cleared. The ligamentum flavum was engaged and the epidural space identified using "loss-of-resistance technique" with 2-3 ml of PF-NaCl (0.9% NSS), in a 5cc dedicated LOR syringe. (See "Imaging guidance" below for use of contrast details.) Once proper needle placement was secured, and negative aspiration confirmed, the solution was injected in intermittent fashion, asking for systemic symptoms every 0.5cc. The needles were then removed and the area cleansed, making sure to leave some of the prepping solution back to take advantage of its long term bactericidal properties.  3 cc solution made of 1 cc of preservative-free saline, 1 cc of 0.2% ropivacaine, 1 cc of Decadron 10 mg/cc.   Vitals:   05/25/22 1404 05/25/22 1430 05/25/22 1433 05/25/22 1435  BP: (!) 128/58 130/77 126/73 134/81  Pulse: 74     Resp: '17 17 17 18  '$ Temp: 98.6 F (37 C)     TempSrc: Temporal     SpO2: 100% 100% 99% 99%  Weight: 106 lb (48.1 kg)     Height: '5\' 2"'$  (1.575 m)        End Time: 1434 hrs.  Imaging Guidance (Spinal):          Type of Imaging Technique: Fluoroscopy Guidance (Spinal) Indication(s): Assistance in needle guidance and placement for procedures  requiring needle placement in or near specific anatomical locations not easily accessible without such assistance. Exposure Time: Please see nurses notes. Contrast: Before injecting any contrast, we confirmed that the patient did not have an allergy to iodine, shellfish, or radiological contrast. Once satisfactory needle placement was completed at the desired level, radiological contrast was injected. Contrast injected under live fluoroscopy. No contrast complications. See chart for type and volume of contrast used. Fluoroscopic Guidance: I was personally present during the use of fluoroscopy. "Tunnel Vision Technique" used to obtain the best possible view of the target area. Parallax error corrected before commencing the procedure. "Direction-depth-direction" technique used to introduce the needle under continuous pulsed fluoroscopy. Once target was reached, antero-posterior, oblique, and lateral fluoroscopic projection used confirm needle placement in all planes. Images permanently stored in EMR. Interpretation: I personally interpreted the imaging intraoperatively. Adequate needle placement confirmed in multiple planes. Appropriate spread of contrast into desired area was observed. No evidence of afferent or efferent intravascular uptake. No intrathecal or subarachnoid spread observed. Permanent images saved into the patient's record.  Post-operative Assessment:  Post-procedure Vital Signs:  Pulse/HCG Rate: 7489 Temp: 98.6 F (37 C) Resp: 18 BP: 134/81 SpO2: 99 %  EBL: None  Complications: No immediate post-treatment complications observed by team, or reported by patient.  Note: The patient tolerated the entire procedure well. A repeat set of vitals were taken after the procedure and the patient was kept under observation following institutional policy, for this type of procedure. Post-procedural neurological assessment was performed, showing return to baseline, prior to discharge. The patient  was provided with post-procedure discharge instructions, including a section on how to identify potential problems. Should any problems arise concerning this procedure, the patient was given instructions to immediately contact us, at any time, without hesitation. In any case, we plan to contact the patient by telephone for a follow-up status report regarding this interventional procedure.  Comments:  No additional relevant information.  Plan of Care (POC)  Orders:  Orders Placed This Encounter  Procedures   DG PAIN  CLINIC C-ARM 1-60 MIN NO REPORT    Intraoperative interpretation by procedural physician at [place].    Standing Status:   Standing    Number of Occurrences:   1    Order Specific Question:   Reason for exam:    Answer:   Assistance in needle guidance and placement for procedures requiring needle placement in or near specific anatomical locations not easily accessible without such assistance.   Chronic Opioid Analgesic:   Tramadol 50 mg daily prn severe breakthrough pain    Medications ordered for procedure: Meds ordered this encounter  Medications   iohexol (OMNIPAQUE) 180 MG/ML injection 10 mL    Must be Myelogram-compatible. If not available, you may substitute with a water-soluble, non-ionic, hypoallergenic, myelogram-compatible radiological contrast medium.   lidocaine (XYLOCAINE) 2 % (with pres) injection 400 mg   sodium chloride flush (NS) 0.9 % injection 1 mL   ropivacaine (PF) 2 mg/mL (0.2%) (NAROPIN) injection 1 mL   dexamethasone (DECADRON) injection 10 mg   Medications administered: We administered iohexol, lidocaine, sodium chloride flush, ropivacaine (PF) 2 mg/mL (0.2%), and dexamethasone.  See the medical record for exact dosing, route, and time of administration.  Follow-up plan:   Return in about 3 weeks (around 06/15/2022) for Post Procedure Evaluation, virtual.       Right SSNB 01/06/21, 08/06/21 , 10/08/21, 01/21/22; C-ESI 05/25/22         Recent Visits Date Type Provider Dept  05/12/22 Office Visit Gillis Santa, MD [facility]  03/04/22 Office Visit Gillis Santa, MD [facility]  Showing recent visits within past 90 days and meeting all other requirements Today's Visits Date Type Provider Dept  05/25/22 Procedure visit Gillis Santa, MD [facility]  Showing today's visits and meeting all other requirements Future Appointments Date Type Provider Dept  06/15/22 Appointment Gillis Santa, MD [facility]  Showing future appointments within next 90 days and meeting all other requirements  Disposition: Discharge home  Discharge (Date  Time): 05/25/2022; 1439 hrs.   Primary Care Physician: Venita Lick, NP Location: [facility] Note by: Gillis Santa, MD (TTS technology used. I apologize for any typographical errors that were not detected and corrected.) Date: 05/25/2022; Time: 2:55 PM  Disclaimer:  Medicine is not an Chief Strategy Officer. The only guarantee in medicine is that nothing is guaranteed. It is important to note that the decision to proceed with this intervention was based on the information collected from the patient. The Data and conclusions were drawn from the patient's questionnaire, the interview, and the physical examination. Because the information was provided in large part by the patient, it cannot be guaranteed that it has not been purposely or unconsciously manipulated. Every effort has been made to obtain as much relevant data as possible for this evaluation. It is important to note that the conclusions that lead to this procedure are derived in large part from the available data. Always take into account that the treatment will also be dependent on availability of resources and existing treatment guidelines, considered by other Pain Management Practitioners as being common knowledge and practice, at the time of the intervention. For  Medico-Legal purposes, it is also important to point out that variation in procedural techniques and pharmacological choices are the acceptable norm. The indications, contraindications, technique, and results of the above procedure should only be interpreted and judged by a Board-Certified Interventional Pain Specialist with extensive familiarity and expertise in the same exact procedure and  technique.

## 2022-05-25 NOTE — Progress Notes (Signed)
Safety precautions to be maintained throughout the outpatient stay will include: orient to surroundings, keep bed in low position, maintain call bell within reach at all times, provide assistance with transfer out of bed and ambulation.  

## 2022-05-26 ENCOUNTER — Telehealth: Payer: Self-pay

## 2022-05-26 NOTE — Telephone Encounter (Signed)
Post procedure follow up.  LM 

## 2022-05-28 ENCOUNTER — Ambulatory Visit (INDEPENDENT_AMBULATORY_CARE_PROVIDER_SITE_OTHER): Payer: 59 | Admitting: Nurse Practitioner

## 2022-05-28 ENCOUNTER — Encounter: Payer: Self-pay | Admitting: Nurse Practitioner

## 2022-05-28 VITALS — BP 134/75 | HR 69 | Temp 97.7°F | Ht 62.01 in | Wt 109.3 lb

## 2022-05-28 DIAGNOSIS — M5412 Radiculopathy, cervical region: Secondary | ICD-10-CM | POA: Diagnosis not present

## 2022-05-28 NOTE — Progress Notes (Signed)
BP 134/75   Pulse 69   Temp 97.7 F (36.5 C) (Oral)   Ht 5' 2.01" (1.575 m)   Wt 109 lb 4.8 oz (49.6 kg)   SpO2 99%   BMI 19.99 kg/m    Subjective:    Patient ID: Danielle Stephens, female    DOB: [birth year], 72 y.o.   MRN: [id number]  HPI: Danielle Stephens is a 72 y.o. female  Chief Complaint  Patient presents with   Surgical Clearance   NECK PAIN  Followed by pain management with injection on 05/25/22.  Saw Dr. Laurance Flatten 05/21/22 -- she is scheduled for surgery upcoming for C4-C5 anterior cervical discectomy and fusion.  However, she would like second opinion on this prior to having surgery.  Has pre-op form to complete today. Diagnosis: Cervical radicular pain Status: stable -- recent injection helped a lot Treatments attempted: rest, ice, heat, APAP, ibuprofen, and physical therapy , injections, pain management Compliant with recommended treatment: yes Relief with NSAIDs?:  moderate Location:Right Duration:chronic Severity: 3/10 today Quality: dull, aching, and throbbing Frequency: intermittent Radiation: R arm Aggravating factors: lifting and movement Alleviating factors: recent injection Weakness:  yes Paresthesias / decreased sensation:  yes  Fevers:  no   Relevant past medical, surgical, family and social history reviewed and updated as indicated. Interim medical history since our last visit reviewed. Allergies and medications reviewed and updated.  Review of Systems  Constitutional:  Negative for activity change, appetite change, diaphoresis, fatigue and fever.  Respiratory:  Negative for cough, chest tightness and shortness of breath.   Cardiovascular:  Negative for chest pain, palpitations and leg swelling.  Musculoskeletal:  Positive for neck pain.  Neurological: Negative.   Psychiatric/Behavioral: Negative.      Per HPI unless specifically indicated above     Objective:    BP 134/75   Pulse 69   Temp 97.7 F (36.5 C) (Oral)   Ht 5' 2.01" (1.575 m)   Wt 109 lb  4.8 oz (49.6 kg)   SpO2 99%   BMI 19.99 kg/m   Wt Readings from Last 3 Encounters:  05/28/22 109 lb 4.8 oz (49.6 kg)  05/25/22 106 lb (48.1 kg)  05/12/22 106 lb (48.1 kg)    Physical Exam Vitals and nursing note reviewed.  Constitutional:      General: She is awake. She is not in acute distress.    Appearance: She is well-developed and well-groomed. She is not ill-appearing.  HENT:     Head: Normocephalic.     Right Ear: Hearing normal.     Left Ear: Hearing normal.  Eyes:     General: Lids are normal.        Right eye: No discharge.        Left eye: No discharge.     Conjunctiva/sclera: Conjunctivae normal.     Pupils: Pupils are equal, round, and reactive to light.  Neck:     Thyroid: No thyromegaly.     Vascular: No carotid bruit.  Cardiovascular:     Rate and Rhythm: Normal rate and regular rhythm.     Heart sounds: Normal heart sounds. No murmur heard.    No gallop.  Pulmonary:     Effort: Pulmonary effort is normal.     Breath sounds: Normal breath sounds.  Abdominal:     General: Bowel sounds are normal.     Palpations: Abdomen is soft.  Musculoskeletal:     Cervical back: Neck supple. No edema or erythema. Pain with  movement (mild) present. No muscular tenderness. Decreased range of motion.     Right lower leg: No edema.     Left lower leg: No edema.  Skin:    General: Skin is warm and dry.  Neurological:     Mental Status: She is alert and oriented to person, place, and time.  Psychiatric:        Attention and Perception: Attention normal.        Mood and Affect: Mood normal.        Speech: Speech normal.        Behavior: Behavior normal. Behavior is cooperative.        Thought Content: Thought content normal.     Results for orders placed or performed in visit on 02/03/22  Comprehensive metabolic panel  Result Value Ref Range   Glucose 72 70 - 99 mg/dL   BUN 14 8 - 27 mg/dL   Creatinine, Ser 0.89 0.57 - 1.00 mg/dL   eGFR 70 >59 mL/min/1.73    BUN/Creatinine Ratio 16 12 - 28   Sodium 144 134 - 144 mmol/L   Potassium 4.2 3.5 - 5.2 mmol/L   Chloride 104 96 - 106 mmol/L   CO2 27 20 - 29 mmol/L   Calcium 9.4 8.7 - 10.3 mg/dL   Total Protein 6.9 6.0 - 8.5 g/dL   Albumin 4.2 3.9 - 4.9 g/dL   Globulin, Total 2.7 1.5 - 4.5 g/dL   Albumin/Globulin Ratio 1.6 1.2 - 2.2   Bilirubin Total 0.4 0.0 - 1.2 mg/dL   Alkaline Phosphatase 66 44 - 121 IU/L   AST 26 0 - 40 IU/L   ALT 14 0 - 32 IU/L  Lipid Panel w/o Chol/HDL Ratio  Result Value Ref Range   Cholesterol, Total 166 100 - 199 mg/dL   Triglycerides 92 0 - 149 mg/dL   HDL 64 >39 mg/dL   VLDL Cholesterol Cal 17 5 - 40 mg/dL   LDL Chol Calc (NIH) 85 0 - 99 mg/dL  VITAMIN D 25 Hydroxy (Vit-D Deficiency, Fractures)  Result Value Ref Range   Vit D, 25-Hydroxy 36.5 30.0 - 100.0 ng/mL      Assessment & Plan:   Problem List Items Addressed This Visit       Other   Cervical radicular pain - Primary    Chronic, ongoing with some improvement with recent injection.  She came in for pre op today, however has requested a referral to [facility] neurosurgery for second opinion as is nervous about having another surgery at this time and wants to ensure this is best decision for her.  Have placed this referral at her request.  Will plan on pre op exam in future if patient decides on surgical intervention for pain.      Relevant Orders   Ambulatory referral to Neurosurgery     Follow up plan: Return for as scheduled in May for physical.

## 2022-05-28 NOTE — Patient Instructions (Signed)
Cervical Radiculopathy  Cervical radiculopathy means that a nerve in the neck (a cervical nerve) is pinched or bruised. This can happen because of an injury to the cervical spine (vertebrae) in the neck, or as a normal part of getting older. This condition can cause pain or loss of feeling (numbness) that runs from your neck all the way down to your arm and fingers. Often, this condition gets better with rest. Treatment may be needed if the condition does not get better. What are the causes? A neck injury. A bulging disk in your spine. Sudden muscle tightening (muscle spasms). Tight muscles in your neck due to overuse. Arthritis. Breakdown in the bones and joints of the spine (spondylosis) due to getting older. Bone spurs that form near the nerves in the neck. What are the signs or symptoms? Pain. The pain may: Run from the neck to the arm and hand. Be very bad or irritating. Get worse when you move your neck. Loss of feeling or tingling in your arm or hand. Weakness in your arm or hand, in very bad cases. How is this treated? In many cases, treatment is not needed for this condition. With rest, the condition often gets better over time. If treatment is needed, options may include: Wearing a soft neck collar (cervical collar) for short periods of time. Doing exercises (physical therapy) to strengthen your neck muscles. Taking medicines. Having shots (injections) in your spine, in very bad cases. Having surgery. This may be needed if other treatments do not help. The type of surgery that is used will depend on the cause of your condition. Follow these instructions at home: If you have a soft neck collar: Wear it as told by your doctor. Take it off only as told by your doctor. Ask your doctor if you can take the collar off for cleaning and bathing. If you are allowed to take the collar off for cleaning or bathing: Follow instructions from your doctor about how to take off the collar  safely. Clean the collar by wiping it with mild soap and water and drying it completely. Take out any removable pads in the collar every 1-2 days. Wash them by hand with soap and water. Let them air-dry completely before you put them back in the collar. Check your skin under the collar for redness or sores. If you see any, tell your doctor. Managing pain     Take over-the-counter and prescription medicines only as told by your doctor. If told, put ice on the painful area. To do this: If you have a soft neck collar, take if off as told by your doctor. Put ice in a plastic bag. Place a towel between your skin and the bag. Leave the ice on for 20 minutes, 2-3 times a day. Take off the ice if your skin turns bright red. This is very important. If you cannot feel pain, heat, or cold, you have a greater risk of damage to the area. If using ice does not help, you can try using heat. Use the heat source that your doctor recommends, such as a moist heat pack or a heating pad. Place a towel between your skin and the heat source. Leave the heat on for 20-30 minutes. Take off the heat if your skin turns bright red. This is very important. If you cannot feel pain, heat, or cold, you have a greater risk of getting burned. You may try a gentle neck and shoulder rub (massage). Activity Rest as needed. Return   to your normal activities when your doctor says that it is safe. Do exercises as told by your doctor or physical therapist. You may have to avoid lifting. Ask your doctor how much you can safely lift. General instructions Use a flat pillow when you sleep. Do not drive while wearing a soft neck collar. If you do not have a soft neck collar, ask your doctor if it is safe to drive while your neck heals. Ask your doctor if you should avoid driving or using machines while you are taking your medicine. Do not smoke or use any products that contain nicotine or tobacco. If you need help quitting, ask your  doctor. Keep all follow-up visits. Contact a doctor if: Your condition does not get better with treatment. Get help right away if: Your pain gets worse and medicine does not help. You lose feeling or feel weak in your hand, arm, face, or leg. You have a high fever. Your neck is stiff. You cannot control when you poop or pee (have incontinence). You have trouble with walking, balance, or talking. Summary Cervical radiculopathy means that a nerve in the neck is pinched or bruised. A nerve can get pinched from a bulging disk, arthritis, an injury to the neck, or other causes. Symptoms include pain, tingling, or loss of feeling that goes from the neck to the arm or hand. Weakness in your arm or hand can happen in very bad cases. Treatment may include resting, wearing a soft neck collar, and doing exercises. You might need to take medicines for pain. In very bad cases, shots or surgery may be needed. This information is not intended to replace advice given to you by your health care provider. Make sure you discuss any questions you have with your health care provider. Document Revised: 09/05/2020 Document Reviewed: 09/05/2020 Elsevier Patient Education  2023 Elsevier Inc.  

## 2022-05-28 NOTE — Assessment & Plan Note (Signed)
Chronic, ongoing with some improvement with recent injection.  She came in for pre op today, however has requested a referral to [facility] neurosurgery for second opinion as is nervous about having another surgery at this time and wants to ensure this is best decision for her.  Have placed this referral at her request.  Will plan on pre op exam in future if patient decides on surgical intervention for pain.

## 2022-06-05 ENCOUNTER — Telehealth: Payer: Self-pay

## 2022-06-05 NOTE — Telephone Encounter (Signed)
Copied from [place] [phone]. Topic: Referral - Status >> Jun 05, 2022 10:06 AM Everette C wrote: Reason for CRM: The patient has called to follow up on their previous request for a referral / 2nd opinion with [facility]   Please contact the patient further when possible

## 2022-06-09 NOTE — Telephone Encounter (Signed)
Patient calling back again for referral for second opinion with [facility] neuro about having surgery on her neck

## 2022-06-15 ENCOUNTER — Ambulatory Visit
Payer: 59 | Attending: Student in an Organized Health Care Education/Training Program | Admitting: Student in an Organized Health Care Education/Training Program

## 2022-06-15 ENCOUNTER — Encounter: Payer: Self-pay | Admitting: Student in an Organized Health Care Education/Training Program

## 2022-06-15 DIAGNOSIS — M5412 Radiculopathy, cervical region: Secondary | ICD-10-CM

## 2022-06-15 DIAGNOSIS — M5010 Cervical disc disorder with radiculopathy, unspecified cervical region: Secondary | ICD-10-CM

## 2022-06-15 DIAGNOSIS — G894 Chronic pain syndrome: Secondary | ICD-10-CM

## 2022-06-15 NOTE — Progress Notes (Signed)
Patient: Danielle Stephens  Service Category: E/M  Provider: Gillis Santa, MD  DOB: 1950-05-21  DOS: 06/15/2022  Location: Office  MRN: [id number]  Setting: Ambulatory outpatient  Referring Provider: Venita Lick, NP  Type: Established Patient  Specialty: Interventional Pain Management  PCP: Venita Lick, NP  Location: Remote location  Delivery: TeleHealth     Virtual Encounter - Pain Management PROVIDER NOTE: Information contained herein reflects review and annotations entered in association with encounter. Interpretation of such information and data should be left to medically-trained personnel. Information provided to patient can be located elsewhere in the medical record under "Patient Instructions". Document created using STT-dictation technology, any transcriptional errors that may result from process are unintentional.    Contact & Pharmacy Preferred: [phone] Home: [phone] (home) Mobile: [phone] (mobile) E-mail: [email or web address], [place] [address] Phone: [phone] Fax: [place], [place] [address] [place] [address] Phone: [phone] Fax: [phone]   Pre-screening  Danielle Stephens offered "in-person" vs "virtual" encounter. She indicated preferring virtual for this encounter.   Reason COVID-19*  Social distancing based on CDC and AMA recommendations.   I contacted Danielle Stephens on 06/15/2022 via telephone.      I clearly identified myself as Gillis Santa, MD. I verified that I was speaking with the correct person using two identifiers (Name: TAHLI MATHSON, and date of birth: 07-18-1950).  Consent I sought verbal advanced consent from [place] for virtual visit interactions. I informed Danielle Stephens of possible security and privacy concerns, risks, and limitations associated with  providing "not-in-person" medical evaluation and management services. I also informed Danielle Stephens of the availability of "in-person" appointments. Finally, I informed her that there would be a charge for the virtual visit and that she could be  personally, fully or partially, financially responsible for it. Danielle Stephens expressed understanding and agreed to proceed.   Historic Elements   Danielle Stephens is a 72 y.o. year old, female patient evaluated today after our last contact on 05/25/2022. Danielle Stephens  has a past medical history of Anxiety, Chronic kidney disease, Hypertension, and Shoulder pain. 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 Danielle Stephens has a current medication list which includes the following prescription(s): amlodipine, vitamin d, gabapentin, lactase, lisinopril, methocarbamol, multivitamin, ondansetron, and rosuvastatin. She  reports that she has never smoked. She has never used smokeless tobacco. She reports that she does not drink alcohol and does not use drugs. Danielle Stephens is allergic to lactose intolerance (gi) and codeine.  BMI: Estimated body mass index is 19.99 kg/m as calculated from the following:   Height as of 05/28/22: 5' 2.01" (1.575 m).   Weight as of 05/28/22: 109 lb 4.8 oz (49.6 kg). Last encounter: 05/12/2022. Last procedure: 05/25/2022.  HPI  Today, she is being contacted for a post-procedure assessment.   Post-procedure evaluation   Procedure: Cervical Epidural Steroid injection (CESI) (Interlaminar) #1  Laterality: Right  Level: C7-T1 Imaging: Fluoroscopy-assisted DOS: 05/25/2022  Performed by: Gillis Santa, MD Anesthesia: Local anesthesia  (1-2% Lidocaine)  Purpose: Diagnostic/Therapeutic Indications:  Cervicalgia, cervical radicular pain, degenerative disc disease, severe enough to impact quality of life or function. 1. Cervical radicular pain   2. Herniation of cervical intervertebral disc with radiculopathy   3. Chronic pain syndrome    NAS-11 score:   Pre-procedure: 8 /10   Post-procedure: 0-No pain/10      Effectiveness:  Initial hour after procedure: 100% Subsequent 4-6 hours post-procedure: 100% Analgesia past initial 6 hours: 50% Ongoing improvement:  Analgesic:  50% pain relief for approx 20 days Function: Back to baseline ROM: Back to baseline   Pharmacotherapy Assessment   Opioid Analgesic:  Tramadol 50 mg daily prn severe breakthrough pain    Monitoring:  PMP: PDMP not reviewed this encounter.       Pharmacotherapy: No side-effects or adverse reactions reported. Compliance: No problems identified. Effectiveness: Clinically acceptable. Plan: Refer to "POC". UDS:  Summary  Date Value Ref Range Status  12/23/2020 Note  Final    Comment:    ==================================================================== Compliance Drug Analysis, Ur ==================================================================== Test                             Result       Flag       Units  Drug Present and Declared for Prescription Verification   Tramadol                       290          EXPECTED   ng/mg creat   O-Desmethyltramadol            500          EXPECTED   ng/mg creat    Source of tramadol is a prescription medication. O-desmethyltramadol    is an expected metabolite of tramadol.    Acetaminophen                  PRESENT      EXPECTED  Drug Present not Declared for Prescription Verification   Diphenhydramine                PRESENT      UNEXPECTED  Drug Absent but Declared for Prescription Verification   Oxycodone                      Not Detected UNEXPECTED ng/mg creat   Tizanidine                      Not Detected UNEXPECTED    Tizanidine, as indicated in the declared medication list, is not    always detected even when used as directed.    Methocarbamol                  Not Detected UNEXPECTED   Doxylamine                     Not Detected UNEXPECTED ==================================================================== Test                      Result    Flag   Units      Ref Range   Creatinine              30               mg/dL      >=20 ==================================================================== Declared Medications:  The flagging and interpretation on this report are  based on the  following declared medications.  Unexpected results may arise from  inaccuracies in the declared medications.   **Note: The testing scope of this panel includes these medications:   Doxylamine  Methocarbamol (Robaxin)  Oxycodone (Roxicodone)  Tramadol (Ultram)   **Note: The testing scope of this panel does not include small to  moderate amounts of these reported medications:   Acetaminophen (Tylenol)  Tizanidine (Zanaflex)   **Note: The testing scope of this panel does not include the  following reported medications:   Biotin  Fexofenadine (Allegra)  Lisinopril (Zestril)  Melatonin  Meloxicam (Mobic)  Multivitamin  Ondansetron (Zofran)  Vitamin C  Vitamin D ==================================================================== For clinical consultation, please call [phone]. ====================================================================    No results found for: "CBDTHCR", "D8THCCBX", "D9THCCBX"   Laboratory Chemistry Profile   Renal Lab Results  Component Value Date   BUN 14 02/03/2022   CREATININE 0.89 02/03/2022   BCR 16 02/03/2022   GFRAA 71 02/16/2020   GFRNONAA >60 05/16/2020    Hepatic Lab Results  Component Value Date   AST 26 02/03/2022   ALT 14 02/03/2022   ALBUMIN 4.2 02/03/2022   ALKPHOS 66 02/03/2022    Electrolytes Lab Results   Component Value Date   NA 144 02/03/2022   K 4.2 02/03/2022   CL 104 02/03/2022   CALCIUM 9.4 02/03/2022    Bone Lab Results  Component Value Date   VD25OH 36.5 02/03/2022    Inflammation (CRP: Acute Phase) (ESR: Chronic Phase) Lab Results  Component Value Date   CRP <1 04/14/2021   ESRSEDRATE 8 04/14/2021         Note: Above Lab results reviewed.   Assessment  The primary encounter diagnosis was Cervical radicular pain. Diagnoses of Herniation of cervical intervertebral disc with radiculopathy and Chronic pain syndrome were also pertinent to this visit.  Plan of Care  Limited response after initial C-ESI Waiting for 2nd opinion regarding cervical spine surgery  Follow-up plan:   Return if symptoms worsen or fail to improve.      Right SSNB 01/06/21, 08/06/21 , 10/08/21, 01/21/22; C-ESI 05/25/22       Recent Visits Date Type Provider Dept  05/25/22 Procedure visit Gillis Santa, MD [facility]  05/12/22 Office Visit Gillis Santa, MD [facility]  Showing recent visits within past 90 days and meeting all other requirements Today's Visits Date Type Provider Dept  06/15/22 Office Visit Gillis Santa, MD [facility]  Showing today's visits and meeting all other requirements Future Appointments No visits were found meeting these conditions. Showing future appointments within next 90 days and meeting all other requirements  I discussed the assessment and treatment plan with the patient. The patient was provided an opportunity to ask questions and all were answered. The patient agreed with the plan and demonstrated an understanding of the instructions.  Patient advised to call back or seek an in-person evaluation if the symptoms or condition worsens.  Duration of encounter: 65minutes.  Note by: Gillis Santa, MD Date: 06/15/2022; Time: 3:49 PM

## 2022-06-17 ENCOUNTER — Other Ambulatory Visit: Payer: Self-pay | Admitting: Nurse Practitioner

## 2022-06-17 DIAGNOSIS — R921 Mammographic calcification found on diagnostic imaging of breast: Secondary | ICD-10-CM

## 2022-06-17 DIAGNOSIS — R928 Other abnormal and inconclusive findings on diagnostic imaging of breast: Secondary | ICD-10-CM

## 2022-06-23 ENCOUNTER — Ambulatory Visit (INDEPENDENT_AMBULATORY_CARE_PROVIDER_SITE_OTHER): Payer: 59 | Admitting: Family Medicine

## 2022-06-23 ENCOUNTER — Ambulatory Visit: Payer: 59 | Admitting: Family Medicine

## 2022-06-23 VITALS — BP 136/74 | HR 73 | Temp 98.1°F | Wt 110.5 lb

## 2022-06-23 DIAGNOSIS — H00021 Hordeolum internum right upper eyelid: Secondary | ICD-10-CM | POA: Diagnosis not present

## 2022-06-23 DIAGNOSIS — R0789 Other chest pain: Secondary | ICD-10-CM | POA: Insufficient documentation

## 2022-06-23 MED ORDER — ERYTHROMYCIN 5 MG/GM OP OINT: 1.0000 | TOPICAL_OINTMENT | Freq: Every day | OPHTHALMIC | 0 refills | Status: AC

## 2022-06-23 MED ORDER — NAPROXEN 500 MG PO TABS: 500.0000 mg | ORAL_TABLET | Freq: Two times a day (BID) | ORAL | 0 refills | Status: DC | PRN

## 2022-06-23 NOTE — Assessment & Plan Note (Addendum)
Acute, ongoing. EKG showed NSR today. CBC with platelet ordered today to look at [facility] and platelet's. Naproxen 500 mg BID PRN ordered for pain relief. Recommend continued use of Robaxin at night for pain, heat to affected area, and provided upper body exercises to complete. Bilateral mammogram ordered for 6 month follow up of abnormal right breast mammogram and for tenderness over left chest wall. Follow up if pain worsens or does not improve.

## 2022-06-23 NOTE — Patient Instructions (Addendum)
Take Naproxen as needed Try heat to area Try stretching exercises Rest the affected side

## 2022-06-23 NOTE — Addendum Note (Signed)
Addended by: Prescott Gum on: 06/23/2022 04:02 PM   Modules accepted: Orders

## 2022-06-23 NOTE — Progress Notes (Signed)
BP 136/74   Pulse 73   Temp 98.1 F (36.7 C)   Wt 110 lb 8 oz (50.1 kg)   SpO2 99%   BMI 20.21 kg/m    Subjective:    Patient ID: Danielle Stephens, female    DOB: Feb 19, 1951, 72 y.o.   MRN: [id number]  HPI: Danielle Stephens is a 72 y.o. female  Chief Complaint  Patient presents with   abnormal mamogram results   Left breast pain   Stye on right eye    LEFT CHEST WALL TENDERNESS For over a month she has had to overuse her left arm changing gears in her car due to limited range of motion of her right arm. She is complaining of chest wall tenderness/ pain on the left side below her breast, the pain does not radiate. The pain does wake her from her sleep at night but does not interfere with her ADLs. She originally felt pain taking deep breaths in and out but this has resolved. She had an abnormal mammogram of the right breast that showed calcification with recommendation of a 6 month repeat mammogram which she has not been able to schedule yet.  Duration : 1.5 weeks ago Location: left Onset: sudden Severity: 8/10 Quality: sore and throbbing Frequency: constant Redness: no Swelling: no Sob: no Trauma: no trauma Breastfeeding: no Nipple discharge: no Breast lump: no Status: worse Treatments attempted:  Tylenol and gabapentin relieved pain, she is taking Robaxin 500 mg and that helps her sleep with the pain.   Previous mammogram: yes abnormal   HORDEOLUM OF RIGHT UPPER EYELID Has had episode x1 last year, which resolved after using Erythromycin eye drops.  Duration: 2  weeks Involved eye:  right upper eyelid Onset: sudden Severity: no pain  Quality:  No pain Foreign body sensation:no Visual impairment: no Eye redness: no Discharge: no Crusting or matting of eyelids: no Swelling: no Photophobia: no Itching: no Tearing: no Headache: no Floaters: no URI symptoms: no Contact lens use: no Close contacts with similar problems: no Eye trauma: no Aggravating factors: wire  from wearing a bra Alleviating factors: Erythromycin ointment helps Status: stable Treatments attempted: Erythromycin ointment helps.   Relevant past medical, surgical, family and social history reviewed and updated as indicated. Interim medical history since our last visit reviewed. Allergies and medications reviewed and updated.  Review of Systems  Eyes:  Negative for photophobia, pain, discharge, redness, itching and visual disturbance.       Hordeolum internally of right upper eyelid  Respiratory:  Negative for chest tightness and shortness of breath.   Cardiovascular:  Positive for chest pain. Negative for palpitations and leg swelling.       Pain over left anterior ribs 5 &6  Musculoskeletal:  Positive for myalgias. Negative for arthralgias, back pain, gait problem and neck stiffness.  Neurological: Negative.   Hematological: Negative.     Per HPI unless specifically indicated above     Objective:    BP 136/74   Pulse 73   Temp 98.1 F (36.7 C)   Wt 110 lb 8 oz (50.1 kg)   SpO2 99%   BMI 20.21 kg/m   Wt Readings from Last 3 Encounters:  06/23/22 110 lb 8 oz (50.1 kg)  05/28/22 109 lb 4.8 oz (49.6 kg)  05/25/22 106 lb (48.1 kg)    Physical Exam Vitals and nursing note reviewed.  Constitutional:      General: She is awake. She is not in acute distress.  Appearance: Normal appearance. She is well-developed and well-groomed. She is not ill-appearing.  HENT:     Head: Normocephalic and atraumatic.     Right Ear: Hearing and external ear normal. No drainage.     Left Ear: Hearing and external ear normal. No drainage.     Nose: Nose normal.  Eyes:     General: Lids are normal.        Right eye: Hordeolum present. No discharge.        Left eye: No discharge.     Conjunctiva/sclera: Conjunctivae normal.     Right eye: No exudate or hemorrhage.    Left eye: No exudate or hemorrhage.    Comments: Upper eyelid  Cardiovascular:     Rate and Rhythm: Normal rate and  regular rhythm.     Heart sounds: Normal heart sounds, S1 normal and S2 normal. No murmur heard.    No gallop.  Pulmonary:     Effort: Pulmonary effort is normal. No accessory muscle usage or respiratory distress.     Breath sounds: Normal breath sounds.  Chest:     Chest wall: Tenderness present.  Breasts:    Right: No swelling, bleeding, nipple discharge, skin change or tenderness.     Left: No swelling, bleeding, nipple discharge, skin change or tenderness.    Musculoskeletal:     Right shoulder: Decreased range of motion.     Left shoulder: Normal range of motion.     Cervical back: Normal range of motion.     Right lower leg: No edema.     Left lower leg: No edema.  Skin:    General: Skin is warm and dry.     Capillary Refill: Capillary refill takes less than 2 seconds.  Neurological:     Mental Status: She is alert and oriented to person, place, and time.  Psychiatric:        Attention and Perception: Attention normal.        Mood and Affect: Mood normal.        Speech: Speech normal.        Behavior: Behavior normal. Behavior is cooperative.        Thought Content: Thought content normal.     Results for orders placed or performed in visit on 02/03/22  Comprehensive metabolic panel  Result Value Ref Range   Glucose 72 70 - 99 mg/dL   BUN 14 8 - 27 mg/dL   Creatinine, Ser 1.77 0.57 - 1.00 mg/dL   eGFR 70 >93 JQ/ZES/9.23   BUN/Creatinine Ratio 16 12 - 28   Sodium 144 134 - 144 mmol/L   Potassium 4.2 3.5 - 5.2 mmol/L   Chloride 104 96 - 106 mmol/L   CO2 27 20 - 29 mmol/L   Calcium 9.4 8.7 - 10.3 mg/dL   Total Protein 6.9 6.0 - 8.5 g/dL   Albumin 4.2 3.9 - 4.9 g/dL   Globulin, Total 2.7 1.5 - 4.5 g/dL   Albumin/Globulin Ratio 1.6 1.2 - 2.2   Bilirubin Total 0.4 0.0 - 1.2 mg/dL   Alkaline Phosphatase 66 44 - 121 IU/L   AST 26 0 - 40 IU/L   ALT 14 0 - 32 IU/L  Lipid Panel w/o Chol/HDL Ratio  Result Value Ref Range   Cholesterol, Total 166 100 - 199 mg/dL    Triglycerides 92 0 - 149 mg/dL   HDL 64 >30 mg/dL   VLDL Cholesterol Cal 17 5 - 40 mg/dL   LDL Chol Calc (NIH)  85 0 - 99 mg/dL  VITAMIN D 25 Hydroxy (Vit-D Deficiency, Fractures)  Result Value Ref Range   Vit D, 25-Hydroxy 36.5 30.0 - 100.0 ng/mL      Assessment & Plan:   Problem List Items Addressed This Visit       Other   Tenderness of chest wall - Primary    Acute, ongoing. EKG showed NSR today. [facility] platelet [facility] Naproxen 500 mg BID PRN ordered for pain relief. Recommend continued use of Robaxin at night for pain, heat to affected area, and provided upper body exercises to complete. Bilateral mammogram ordered for 6 month follow up of abnormal right breast mammogram and for tenderness over left chest wall. Follow up if pain worsens or does not improve.       Relevant Medications   naproxen (NAPROSYN) 500 MG tablet   Other Relevant Orders   EKG 12-Lead (Completed)   CBC With Diff/Platelet   MM Digital Diagnostic Bilat   Other Visit Diagnoses     Hordeolum internum of right upper eyelid       Chronic, stable. Opthalmic Erythromcyin ordered for daily use. Follow up if symptoms worsen.   Relevant Medications   erythromycin ophthalmic ointment        Follow up plan: Return in about 6 weeks (around 08/04/2022), or if symptoms worsen or fail to improve, for physical.

## 2022-06-24 LAB — CBC WITH DIFF/PLATELET
Basophils Absolute: 0 10*3/uL (ref 0.0–0.2)
Basos: 1 %
EOS (ABSOLUTE): 0.2 10*3/uL (ref 0.0–0.4)
Eos: 3 %
Hematocrit: 40.3 % (ref 34.0–46.6)
Hemoglobin: 12.8 g/dL (ref 11.1–15.9)
Immature Grans (Abs): 0 10*3/uL (ref 0.0–0.1)
Immature Granulocytes: 0 %
Lymphocytes Absolute: 1.1 10*3/uL (ref 0.7–3.1)
Lymphs: 18 %
MCH: 26.8 pg (ref 26.6–33.0)
MCHC: 31.8 g/dL (ref 31.5–35.7)
MCV: 85 fL (ref 79–97)
Monocytes Absolute: 0.5 10*3/uL (ref 0.1–0.9)
Monocytes: 9 %
Neutrophils Absolute: 4.5 10*3/uL (ref 1.4–7.0)
Neutrophils: 69 %
Platelets: 225 10*3/uL (ref 150–450)
RBC: 4.77 x10E6/uL (ref 3.77–5.28)
RDW: 12.8 % (ref 11.7–15.4)
WBC: 6.4 10*3/uL (ref 3.4–10.8)

## 2022-06-30 ENCOUNTER — Other Ambulatory Visit: Payer: Self-pay | Admitting: Nurse Practitioner

## 2022-06-30 DIAGNOSIS — R0789 Other chest pain: Secondary | ICD-10-CM

## 2022-06-30 DIAGNOSIS — H00021 Hordeolum internum right upper eyelid: Secondary | ICD-10-CM

## 2022-07-09 ENCOUNTER — Ambulatory Visit
Admission: RE | Admit: 2022-07-09 | Discharge: 2022-07-09 | Disposition: A | Discharge status: Home / Self Care | Payer: 59 | Source: Ambulatory Visit | Attending: Nurse Practitioner | Admitting: Nurse Practitioner

## 2022-07-09 DIAGNOSIS — R0789 Other chest pain: Secondary | ICD-10-CM | POA: Diagnosis not present

## 2022-07-09 DIAGNOSIS — R921 Mammographic calcification found on diagnostic imaging of breast: Secondary | ICD-10-CM | POA: Diagnosis not present

## 2022-07-09 DIAGNOSIS — R928 Other abnormal and inconclusive findings on diagnostic imaging of breast: Secondary | ICD-10-CM

## 2022-07-09 DIAGNOSIS — N644 Mastodynia: Secondary | ICD-10-CM | POA: Diagnosis not present

## 2022-07-13 DIAGNOSIS — H2513 Age-related nuclear cataract, bilateral: Secondary | ICD-10-CM | POA: Diagnosis not present

## 2022-07-13 DIAGNOSIS — H5213 Myopia, bilateral: Secondary | ICD-10-CM | POA: Diagnosis not present

## 2022-07-13 DIAGNOSIS — H02883 Meibomian gland dysfunction of right eye, unspecified eyelid: Secondary | ICD-10-CM | POA: Diagnosis not present

## 2022-07-13 DIAGNOSIS — D3131 Benign neoplasm of right choroid: Secondary | ICD-10-CM | POA: Diagnosis not present

## 2022-07-24 ENCOUNTER — Telehealth: Payer: Self-pay

## 2022-07-24 NOTE — Telephone Encounter (Signed)
Patient called and asked for phone number for [facility] , I gave patient phone number, she stated that she would call and set up an appointment for her second opinion.

## 2022-08-01 NOTE — Patient Instructions (Signed)

## 2022-08-04 ENCOUNTER — Encounter: Payer: Self-pay | Admitting: Nurse Practitioner

## 2022-08-04 ENCOUNTER — Ambulatory Visit (INDEPENDENT_AMBULATORY_CARE_PROVIDER_SITE_OTHER): Payer: 59 | Admitting: Nurse Practitioner

## 2022-08-04 VITALS — BP 132/80 | HR 80 | Temp 97.9°F | Ht 62.01 in | Wt 111.2 lb

## 2022-08-04 DIAGNOSIS — G4709 Other insomnia: Secondary | ICD-10-CM

## 2022-08-04 DIAGNOSIS — M8588 Other specified disorders of bone density and structure, other site: Secondary | ICD-10-CM | POA: Diagnosis not present

## 2022-08-04 DIAGNOSIS — Z0000 Encounter for general adult medical examination without abnormal findings: Secondary | ICD-10-CM

## 2022-08-04 DIAGNOSIS — I10 Essential (primary) hypertension: Secondary | ICD-10-CM | POA: Diagnosis not present

## 2022-08-04 DIAGNOSIS — I872 Venous insufficiency (chronic) (peripheral): Secondary | ICD-10-CM

## 2022-08-04 DIAGNOSIS — I7300 Raynaud's syndrome without gangrene: Secondary | ICD-10-CM | POA: Diagnosis not present

## 2022-08-04 DIAGNOSIS — E559 Vitamin D deficiency, unspecified: Secondary | ICD-10-CM | POA: Diagnosis not present

## 2022-08-04 DIAGNOSIS — G894 Chronic pain syndrome: Secondary | ICD-10-CM | POA: Diagnosis not present

## 2022-08-04 DIAGNOSIS — E782 Mixed hyperlipidemia: Secondary | ICD-10-CM | POA: Diagnosis not present

## 2022-08-04 MED ORDER — LISINOPRIL 20 MG PO TABS: ORAL_TABLET | ORAL | 4 refills | Status: DC

## 2022-08-04 MED ORDER — GABAPENTIN 300 MG PO CAPS: 300.0000 mg | ORAL_CAPSULE | Freq: Every day | ORAL | 2 refills | Status: DC

## 2022-08-04 MED ORDER — ROSUVASTATIN CALCIUM 10 MG PO TABS: 10.0000 mg | ORAL_TABLET | Freq: Every day | ORAL | 4 refills | Status: DC

## 2022-08-04 MED ORDER — TRAZODONE HCL 50 MG PO TABS: 25.0000 mg | ORAL_TABLET | Freq: Every evening | ORAL | 4 refills | Status: DC | PRN

## 2022-08-04 MED ORDER — AMLODIPINE BESYLATE 2.5 MG PO TABS: 2.5000 mg | ORAL_TABLET | Freq: Every day | ORAL | 4 refills | Status: DC

## 2022-08-04 NOTE — Assessment & Plan Note (Signed)
Chronic, stable.  BP at goal in office today on recheck and at goal on home checks per patient.  Continue current medication regimen and adjust as needed.  Refills up to date.  Recommend focus on DASH diet at home and continue to monitor BP regularly.  LABS: CBC, TSH, CMP and Lipid today.

## 2022-08-04 NOTE — Assessment & Plan Note (Signed)
Chronic, ongoing, taking Crestor daily and tolerating.  Continue this medication regimen and adjust as needed.  Lipid panel and CMP today. ?

## 2022-08-04 NOTE — Assessment & Plan Note (Signed)
Ongoing, continue Amlodipine at this time -- is tolerating and BP improved. °

## 2022-08-04 NOTE — Assessment & Plan Note (Signed)
With bilateral varicose veins.  Will continue collaboration with vascular as needed.  Recommend compression hose daily at home.

## 2022-08-04 NOTE — Assessment & Plan Note (Signed)
Noted on past labs with osteopenia.  Continue supplement and check Vit D level today. 

## 2022-08-04 NOTE — Assessment & Plan Note (Signed)
Chronic, ongoing.  No benefit from OTC medications.  Will trial Trazodone 25-50 MG as needed at night.  Educated her on this -- use and side effects.  Discussed with her how to take and when.  Return in August for follow-up on this.

## 2022-08-04 NOTE — Progress Notes (Signed)
BP 132/80 (BP Location: Left Arm, Patient Position: Sitting, Cuff Size: Normal)   Pulse 80   Temp 97.9 F (36.6 C) (Oral)   Ht 5' 2.01" (1.575 m)   Wt 111 lb 3.2 oz (50.4 kg)   SpO2 99%   BMI 20.33 kg/m    Subjective:    Patient ID: Danielle Stephens, female    DOB: 20-Jun-1950, 72 y.o.   MRN: [id number]  HPI: Danielle Stephens is a 72 y.o. female presenting on 08/04/2022 for comprehensive medical examination. Current medical complaints include:none  She currently lives with: husband Menopausal Symptoms: no  HYPERTENSION & HLD Continues on Lisinopril 20 MG daily + Amlodipine 2.5 MG. Taking Crestor for HLD. Hypertension status: stable  Satisfied with current treatment? yes Duration of hypertension: chronic BP monitoring frequency: a few days a week BP range: varies, occasional elevations BP medication side effects:  no Medication compliance: good compliance Previous BP meds: Lisinopril Aspirin: no Recurrent headaches: no Visual changes: no Palpitations: no Dyspnea: no Chest pain: no Lower extremity edema: occasional with varicose veins  Dizzy/lightheaded: no  The 10-year ASCVD risk score (Arnett DK, et al., 2019) is: 10.9%   Values used to calculate the score:     Age: 61 years     Sex: Female     Is Non-Hispanic African American: No     Diabetic: No     Tobacco smoker: No     Systolic Blood Pressure: 132 mmHg     Is BP treated: Yes     HDL Cholesterol: 64 mg/dL     Total Cholesterol: 166 mg/dL    CHRONIC KIDNEY DISEASE Recent labs stable, normal levels since 05/20/2020. CKD status: stable Medications renally dose: yes Previous renal evaluation: no Pneumovax:  Up to Date Influenza Vaccine:  Up to Date    INSOMNIA & CHRONIC PAIN Continues on Gabapentin + Robaxin -- taking lower dose in evening.  Sees pain clinic with last visit 06/15/2022, did get epidural.  Is to see neurosurgery, Dr. Lovell Sheehan.  Continues to have a lot of pain to spine and neck + now new onset of pain to left  leg after stepping on glass -- which set her balance off.  Difficulty sleeping due to pain at night. Duration: chronic Satisfied with sleep quality: no Difficulty falling asleep: yes Difficulty staying asleep: yes Waking a few hours after sleep onset: yes Early morning awakenings: no Daytime hypersomnolence: no Wakes feeling refreshed: no Good sleep hygiene: yes Apnea: no Snoring: no Depressed/anxious mood: no Recent stress: no Restless legs/nocturnal leg cramps: no Chronic pain/arthritis: yes History of sleep study: no Treatments attempted: Melatonin, Zquil     08/04/2022    8:44 AM 06/23/2022    3:45 PM 03/04/2022    1:37 PM 02/03/2022    1:32 PM 12/02/2021    9:58 AM  Depression screen PHQ 2/9  Decreased Interest 0 0 0 0 0  Down, Depressed, Hopeless 0 0 0 0 0  PHQ - 2 Score 0 0 0 0 0  Altered sleeping 2 1  2 1   Tired, decreased energy 0 0  0 0  Change in appetite 0 0  0 0  Feeling bad or failure about yourself  0 0  0 0  Trouble concentrating 0 0  0 0  Moving slowly or fidgety/restless 0 0  0 0  Suicidal thoughts 0 0  0 0  PHQ-9 Score 2 1  2 1   Difficult doing work/chores Not difficult at all Not  difficult at all  Not difficult at all Not difficult at all       08/04/2022    8:44 AM 06/23/2022    3:45 PM 02/03/2022    1:32 PM 11/26/2021    1:37 PM  GAD 7 : Generalized Anxiety Score  Nervous, Anxious, on Edge 0 0 0 0  Control/stop worrying 0 0 0 0  Worry too much - different things 0 0 0 0  Trouble relaxing 0 0 0 0  Restless 0 0 0 0  Easily annoyed or irritable 0 0 0 0  Afraid - awful might happen 0 0 0 0  Total GAD 7 Score 0 0 0 0  Anxiety Difficulty Not difficult at all Not difficult at all Not difficult at all Not difficult at all      02/03/2022    1:31 PM 03/04/2022    1:37 PM 05/12/2022   11:23 AM 05/25/2022    2:03 PM 08/04/2022    8:43 AM  Fall Risk  Falls in the past year? 1 0 0 0 0  Was there an injury with Fall? 0    0  Fall Risk Category  Calculator 1    0  Fall Risk Category (Retired) Low      Patient at Risk for Falls Due to     No Fall Risks  Fall risk Follow up Falls evaluation completed  Falls evaluation completed  Falls evaluation completed    Functional Status Survey: Is the patient deaf or have difficulty hearing?: No Does the patient have difficulty seeing, even when wearing glasses/contacts?: No Does the patient have difficulty concentrating, remembering, or making decisions?: No Does the patient have difficulty walking or climbing stairs?: No Does the patient have difficulty dressing or bathing?: No Does the patient have difficulty doing errands alone such as visiting a doctor's office or shopping?: No    Past Medical History:  Past Medical History:  Diagnosis Date   Anxiety    Chronic kidney disease    STAGE 3   Hypertension    Shoulder pain     Surgical History:  Past Surgical History:  Procedure Laterality Date   CLOSED MANIPULATION SHOULDER WITH STERIOD INJECTION Right 08/28/2019   Procedure: MANIPULATION SHOULDER WITH STEROID INJECTION x2;  Surgeon: Signa Kell, MD;  Location: [facility];  Service: Orthopedics;  Laterality: Right;   COLONOSCOPY     COLONOSCOPY WITH PROPOFOL N/A 09/23/2021   Procedure: COLONOSCOPY WITH PROPOFOL;  Surgeon: Toney Reil, MD;  Location: [facility];  Service: Endoscopy;  Laterality: N/A;  ONE ASCENDING COLON POLYP NOT RETRIEVED   DILATION AND CURETTAGE, DIAGNOSTIC / THERAPEUTIC     POLYPECTOMY N/A 09/23/2021   Procedure: POLYPECTOMY;  Surgeon: Toney Reil, MD;  Location: [facility];  Service: Endoscopy;  Laterality: N/A;   REVERSE SHOULDER ARTHROPLASTY Right 05/20/2020   Procedure: Right reverse shoulder arthroplasty, biceps tenodesis;  Surgeon: Signa Kell, MD;  Location: [facility];  Service: Orthopedics;  Laterality: Right;   rotary cuff Right 03/06/2019   SHOULDER ARTHROSCOPY Right 08/28/2019   Procedure: Right shoulder arthroscopic  capsular release, lysis of adhesion,;  Surgeon: Signa Kell, MD;  Location: [facility];  Service: Orthopedics;  Laterality: Right;    Medications:  Current Outpatient Medications on File Prior to Visit  Medication Sig   Cholecalciferol (VITAMIN D) 50 MCG (2000 UT) CAPS Take 2,000 Units by mouth daily. gummie   lactase (LACTAID) 3000 units tablet Take 1 tablet by mouth 3 (three) times  daily with meals.   methocarbamol (ROBAXIN) 500 MG tablet Take 1 tablet (500 mg total) by mouth every 8 (eight) hours as needed for muscle spasms.   Multiple Vitamin (MULTIVITAMIN) capsule Take 1 capsule by mouth daily.   naproxen (NAPROSYN) 500 MG tablet Take 1 tablet (500 mg total) by mouth 2 (two) times daily as needed.   ondansetron (ZOFRAN) 4 MG tablet Take 1 tablet (4 mg total) by mouth daily as needed for nausea or vomiting.   No current facility-administered medications on file prior to visit.    Allergies:  Allergies  Allergen Reactions   Lactose Intolerance (Gi) Diarrhea   Codeine Rash    Social History:  Social History   Socioeconomic History   Marital status: Married    Spouse name: Not on file   Number of children: 2   Years of education: Not on file   Highest education level: Not on file  Occupational History   Occupation: retired  Tobacco Use   Smoking status: Never   Smokeless tobacco: Never  Vaping Use   Vaping Use: Never used  Substance and Sexual Activity   Alcohol use: No   Drug use: No   Sexual activity: Yes  Other Topics Concern   Not on file  Social History Narrative   Not on file   Social Determinants of Health   Financial Resource Strain: Low Risk  (10/28/2020)   Overall Financial Resource Strain (CARDIA)    Difficulty of Paying Living Expenses: Not hard at all  Food Insecurity: No Food Insecurity (12/02/2021)   Hunger Vital Sign    Worried About Running Out of Food in the Last Year: Never true    Ran Out of Food in the Last Year: Never true  Transportation  Needs: No Transportation Needs (12/02/2021)   PRAPARE - Administrator, Civil Service (Medical): No    Lack of Transportation (Non-Medical): No  Physical Activity: Inactive (10/28/2020)   Exercise Vital Sign    Days of Exercise per Week: 0 days    Minutes of Exercise per Session: 0 min  Stress: No Stress Concern Present (10/28/2020)   [employer] of Occupational Health - Occupational Stress Questionnaire    Feeling of Stress : Not at all  Social Connections: Moderately Integrated (12/02/2021)   Social Connection and Isolation Panel [NHANES]    Frequency of Communication with Friends and Family: Twice a week    Frequency of Social Gatherings with Friends and Family: Twice a week    Attends Religious Services: Never    Database administrator or Organizations: Yes    Attends Engineer, structural: More than 4 times per year    Marital Status: Married  Catering manager Violence: Not At Risk (12/02/2021)   Humiliation, Afraid, Rape, and Kick questionnaire    Fear of Current or Ex-Partner: No    Emotionally Abused: No    Physically Abused: No    Sexually Abused: No   Social History   Tobacco Use  Smoking Status Never  Smokeless Tobacco Never   Social History   Substance and Sexual Activity  Alcohol Use No    Family History:  Family History  Problem Relation Age of Onset   Hypertension Mother    Cancer Mother    Hypertension Father    Heart attack Father    Stroke Sister    Hypertension Sister    Heart attack Brother    Hypertension Sister    Hypertension Sister  Hypertension Sister    Hypertension Sister    Hypertension Sister    Down syndrome Brother    Breast cancer Neg Hx     Past medical history, surgical history, medications, allergies, family history and social history reviewed with patient today and changes made to appropriate areas of the chart.   ROS All other ROS negative except what is listed above and in the HPI.       Objective:    BP 132/80 (BP Location: Left Arm, Patient Position: Sitting, Cuff Size: Normal)   Pulse 80   Temp 97.9 F (36.6 C) (Oral)   Ht 5' 2.01" (1.575 m)   Wt 111 lb 3.2 oz (50.4 kg)   SpO2 99%   BMI 20.33 kg/m   Wt Readings from Last 3 Encounters:  08/04/22 111 lb 3.2 oz (50.4 kg)  06/23/22 110 lb 8 oz (50.1 kg)  05/28/22 109 lb 4.8 oz (49.6 kg)    Physical Exam Vitals and nursing note reviewed. Exam conducted with a chaperone present.  Constitutional:      General: She is awake. She is not in acute distress.    Appearance: She is well-developed and well-groomed. She is not ill-appearing or toxic-appearing.  HENT:     Head: Normocephalic and atraumatic.     Right Ear: Hearing, tympanic membrane, ear canal and external ear normal. No drainage.     Left Ear: Hearing, tympanic membrane, ear canal and external ear normal. No drainage.     Nose: Nose normal.     Right Sinus: No maxillary sinus tenderness or frontal sinus tenderness.     Left Sinus: No maxillary sinus tenderness or frontal sinus tenderness.     Mouth/Throat:     Mouth: Mucous membranes are moist.     Pharynx: Oropharynx is clear. Uvula midline. No pharyngeal swelling, oropharyngeal exudate or posterior oropharyngeal erythema.  Eyes:     General: Lids are normal.        Right eye: No discharge.        Left eye: No discharge.     Extraocular Movements: Extraocular movements intact.     Conjunctiva/sclera: Conjunctivae normal.     Pupils: Pupils are equal, round, and reactive to light.     Visual Fields: Right eye visual fields normal and left eye visual fields normal.  Neck:     Thyroid: No thyromegaly.     Vascular: No carotid bruit.     Trachea: Trachea normal.  Cardiovascular:     Rate and Rhythm: Normal rate and regular rhythm.     Heart sounds: Normal heart sounds. No murmur heard.    No gallop.  Pulmonary:     Effort: Pulmonary effort is normal. No accessory muscle usage or respiratory distress.      Breath sounds: Normal breath sounds.  Chest:  Breasts:    Right: Normal.     Left: Normal.  Abdominal:     General: Bowel sounds are normal.     Palpations: Abdomen is soft. There is no hepatomegaly or splenomegaly.     Tenderness: There is no abdominal tenderness.  Musculoskeletal:        General: Normal range of motion.     Cervical back: Normal range of motion and neck supple.     Right lower leg: No edema.     Left lower leg: No edema.  Lymphadenopathy:     Head:     Right side of head: No submental, submandibular, tonsillar, preauricular or posterior auricular  adenopathy.     Left side of head: No submental, submandibular, tonsillar, preauricular or posterior auricular adenopathy.     Cervical: No cervical adenopathy.     Upper Body:     Right upper body: No supraclavicular, axillary or pectoral adenopathy.     Left upper body: No supraclavicular, axillary or pectoral adenopathy.  Skin:    General: Skin is warm and dry.     Capillary Refill: Capillary refill takes less than 2 seconds.     Findings: No rash.  Neurological:     Mental Status: She is alert and oriented to person, place, and time.     Gait: Gait is intact.     Deep Tendon Reflexes: Reflexes are normal and symmetric.     Reflex Scores:      Brachioradialis reflexes are 2+ on the right side and 2+ on the left side.      Patellar reflexes are 2+ on the right side and 2+ on the left side. Psychiatric:        Attention and Perception: Attention normal.        Mood and Affect: Mood normal.        Speech: Speech normal.        Behavior: Behavior normal. Behavior is cooperative.        Thought Content: Thought content normal.        Judgment: Judgment normal.     Results for orders placed or performed in visit on 06/23/22  CBC With Diff/Platelet  Result Value Ref Range   WBC 6.4 3.4 - 10.8 x10E3/uL   RBC 4.77 3.77 - 5.28 x10E6/uL   Hemoglobin 12.8 11.1 - 15.9 g/dL   Hematocrit 16.1 09.6 - 46.6 %   MCV  85 79 - 97 fL   MCH 26.8 26.6 - 33.0 pg   MCHC 31.8 31.5 - 35.7 g/dL   RDW 04.5 40.9 - 81.1 %   Platelets 225 150 - 450 x10E3/uL   Neutrophils 69 Not Estab. %   Lymphs 18 Not Estab. %   Monocytes 9 Not Estab. %   Eos 3 Not Estab. %   Basos 1 Not Estab. %   Neutrophils Absolute 4.5 1.4 - 7.0 x10E3/uL   Lymphocytes Absolute 1.1 0.7 - 3.1 x10E3/uL   Monocytes Absolute 0.5 0.1 - 0.9 x10E3/uL   EOS (ABSOLUTE) 0.2 0.0 - 0.4 x10E3/uL   Basophils Absolute 0.0 0.0 - 0.2 x10E3/uL   Immature Granulocytes 0 Not Estab. %   Immature Grans (Abs) 0.0 0.0 - 0.1 x10E3/uL      Assessment & Plan:   Problem List Items Addressed This Visit       Cardiovascular and Mediastinum   Benign essential HTN - Primary    Chronic, stable.  BP at goal in office today on recheck and at goal on home checks per patient.  Continue current medication regimen and adjust as needed.  Refills up to date.  Recommend focus on DASH diet at home and continue to monitor BP regularly.  LABS: CBC, TSH, CMP and Lipid today.      Relevant Medications   rosuvastatin (CRESTOR) 10 MG tablet   amLODipine (NORVASC) 2.5 MG tablet   lisinopril (ZESTRIL) 20 MG tablet   Other Relevant Orders   CBC with Differential/Platelet   Comprehensive metabolic panel   TSH   Chronic venous insufficiency    With bilateral varicose veins.  Will continue collaboration with vascular as needed.  Recommend compression hose daily at home.  Relevant Medications   rosuvastatin (CRESTOR) 10 MG tablet   amLODipine (NORVASC) 2.5 MG tablet   lisinopril (ZESTRIL) 20 MG tablet   Raynaud's disease without gangrene    Ongoing, continue Amlodipine at this time -- is tolerating and BP improved.      Relevant Medications   rosuvastatin (CRESTOR) 10 MG tablet   amLODipine (NORVASC) 2.5 MG tablet   lisinopril (ZESTRIL) 20 MG tablet     Musculoskeletal and Integument   Osteopenia    Chronic.  Noted on DEXA 12/07/19 with T score -2.2.  Continue daily  Vitamin D and check level today.  Next scan 12/06/2024.      Relevant Orders   VITAMIN D 25 Hydroxy (Vit-D Deficiency, Fractures)     Other   Chronic pain syndrome    Chronic, ongoing.  Continue collaboration with pain clinic, recent notes reviewed.      Relevant Medications   traZODone (DESYREL) 50 MG tablet   gabapentin (NEURONTIN) 300 MG capsule   Hyperlipidemia, mixed    Chronic, ongoing, taking Crestor daily and tolerating.  Continue this medication regimen and adjust as needed.  Lipid panel and CMP today.      Relevant Medications   rosuvastatin (CRESTOR) 10 MG tablet   amLODipine (NORVASC) 2.5 MG tablet   lisinopril (ZESTRIL) 20 MG tablet   Other Relevant Orders   Comprehensive metabolic panel   Lipid Panel w/o Chol/HDL Ratio   Insomnia    Chronic, ongoing.  No benefit from OTC medications.  Will trial Trazodone 25-50 MG as needed at night.  Educated her on this -- use and side effects.  Discussed with her how to take and when.  Return in August for follow-up on this.      Vitamin D deficiency    Noted on past labs with osteopenia.  Continue supplement and check Vit D level today.      Relevant Orders   VITAMIN D 25 Hydroxy (Vit-D Deficiency, Fractures)   Other Visit Diagnoses     Encounter for annual physical exam       Annual physical performed today and health maintenance reviewed with patient.        Follow up plan: Return in about 12 weeks (around 10/27/2022) for INSOMNIA.   LABORATORY TESTING:  - Pap smear: not applicable  IMMUNIZATIONS:   - Tdap: Tetanus vaccination status reviewed: last tetanus booster within 10 years. - Influenza: Up to date - Pneumovax: Up to date - Prevnar: Up to date - COVID: Up to date - HPV: Not applicable - Shingrix vaccine: Has had one injection -- she passed out after this, but had two shots at one time -- is due to get second Shingrix and will obtain this.  SCREENING: -Mammogram: Up to date on 07/09/22 - Colonoscopy:  Up to date  - Bone Density: Up to date  -Hearing Test: Not applicable  -Spirometry: Not applicable   PATIENT COUNSELING:   Advised to take 1 mg of folate supplement per day if capable of pregnancy.   Sexuality: Discussed sexually transmitted diseases, partner selection, use of condoms, avoidance of unintended pregnancy  and contraceptive alternatives.   Advised to avoid cigarette smoking.  I discussed with the patient that most people either abstain from alcohol or drink within safe limits (<=14/week and <=4 drinks/occasion for males, <=7/weeks and <= 3 drinks/occasion for females) and that the risk for alcohol disorders and other health effects rises proportionally with the number of drinks per week and how often a drinker  exceeds daily limits.  Discussed cessation/primary prevention of drug use and availability of treatment for abuse.   Diet: Encouraged to adjust caloric intake to maintain  or achieve ideal body weight, to reduce intake of dietary saturated fat and total fat, to limit sodium intake by avoiding high sodium foods and not adding table salt, and to maintain adequate dietary potassium and calcium preferably from fresh fruits, vegetables, and low-fat dairy products.    Stressed the importance of regular exercise  Injury prevention: Discussed safety belts, safety helmets, smoke detector, smoking near bedding or upholstery.   Dental health: Discussed importance of regular tooth brushing, flossing, and dental visits.    NEXT PREVENTATIVE PHYSICAL DUE IN 1 YEAR. Return in about 12 weeks (around 10/27/2022) for INSOMNIA.

## 2022-08-04 NOTE — Assessment & Plan Note (Signed)
Chronic.  Noted on DEXA 12/07/19 with T score -2.2.  Continue daily Vitamin D and check level today.  Next scan 12/06/2024. 

## 2022-08-04 NOTE — Assessment & Plan Note (Signed)
Chronic, ongoing.  Continue collaboration with pain clinic, recent notes reviewed.

## 2022-08-05 ENCOUNTER — Other Ambulatory Visit: Payer: Self-pay | Admitting: Nurse Practitioner

## 2022-08-05 LAB — CBC WITH DIFFERENTIAL/PLATELET
Basophils Absolute: 0 10*3/uL (ref 0.0–0.2)
Basos: 1 %
EOS (ABSOLUTE): 0.2 10*3/uL (ref 0.0–0.4)
Eos: 4 %
Hematocrit: 41.1 % (ref 34.0–46.6)
Hemoglobin: 13.4 g/dL (ref 11.1–15.9)
Immature Grans (Abs): 0 10*3/uL (ref 0.0–0.1)
Immature Granulocytes: 0 %
Lymphocytes Absolute: 0.9 10*3/uL (ref 0.7–3.1)
Lymphs: 21 %
MCH: 27.5 pg (ref 26.6–33.0)
MCHC: 32.6 g/dL (ref 31.5–35.7)
MCV: 84 fL (ref 79–97)
Monocytes Absolute: 0.3 10*3/uL (ref 0.1–0.9)
Monocytes: 8 %
Neutrophils Absolute: 2.8 10*3/uL (ref 1.4–7.0)
Neutrophils: 66 %
Platelets: 196 10*3/uL (ref 150–450)
RBC: 4.88 x10E6/uL (ref 3.77–5.28)
RDW: 13.5 % (ref 11.7–15.4)
WBC: 4.2 10*3/uL (ref 3.4–10.8)

## 2022-08-05 LAB — COMPREHENSIVE METABOLIC PANEL
ALT: 14 IU/L (ref 0–32)
AST: 26 IU/L (ref 0–40)
Albumin/Globulin Ratio: 1.8 (ref 1.2–2.2)
Albumin: 4.6 g/dL (ref 3.9–4.9)
Alkaline Phosphatase: 66 IU/L (ref 44–121)
BUN/Creatinine Ratio: 11 — ABNORMAL LOW (ref 12–28)
BUN: 10 mg/dL (ref 8–27)
Bilirubin Total: 0.4 mg/dL (ref 0.0–1.2)
CO2: 23 mmol/L (ref 20–29)
Calcium: 9.5 mg/dL (ref 8.7–10.3)
Chloride: 102 mmol/L (ref 96–106)
Creatinine, Ser: 0.89 mg/dL (ref 0.57–1.00)
Globulin, Total: 2.5 g/dL (ref 1.5–4.5)
Glucose: 94 mg/dL (ref 70–99)
Potassium: 4 mmol/L (ref 3.5–5.2)
Sodium: 142 mmol/L (ref 134–144)
Total Protein: 7.1 g/dL (ref 6.0–8.5)
eGFR: 70 mL/min/{1.73_m2} (ref >59)

## 2022-08-05 LAB — LIPID PANEL W/O CHOL/HDL RATIO
Cholesterol, Total: 175 mg/dL (ref 100–199)
HDL: 70 mg/dL (ref >39)
LDL Chol Calc (NIH): 91 mg/dL (ref 0–99)
Triglycerides: 78 mg/dL (ref 0–149)
VLDL Cholesterol Cal: 14 mg/dL (ref 5–40)

## 2022-08-05 LAB — VITAMIN D 25 HYDROXY (VIT D DEFICIENCY, FRACTURES): Vit D, 25-Hydroxy: 44 ng/mL (ref 30.0–100.0)

## 2022-08-05 LAB — TSH: TSH: 2.64 u[IU]/mL (ref 0.450–4.500)

## 2022-08-05 MED ORDER — ROSUVASTATIN CALCIUM 20 MG PO TABS
20.0000 mg | ORAL_TABLET | Freq: Every day | ORAL | 4 refills | Status: DC
Start: 2022-08-05 — End: 2023-08-10

## 2022-08-05 NOTE — Progress Notes (Signed)
Contacted via MyChart   Good morning Danielle Stephens, your labs have returned: - Kidney function, creatinine and eGFR, remains normal, as is liver function, AST and ALT.  - CBC shows normal blood counts. - Cholesterol levels stable, but would like to see LDL <70.  I am going to increase your Rosuvastatin to 20 MG and recheck levels in August at your visit.  Stop 10 MG Rosuvastatin. - Vitamin D and thyroid level are normal.  Continue supplement.  Any questions? Keep being amazing!!  Thank you for allowing me to participate in your care.  I appreciate you. Kindest regards, Jacobo Moncrief

## 2022-08-06 ENCOUNTER — Ambulatory Visit
Payer: 59 | Attending: Student in an Organized Health Care Education/Training Program | Admitting: Student in an Organized Health Care Education/Training Program

## 2022-08-06 ENCOUNTER — Encounter: Payer: Self-pay | Admitting: Student in an Organized Health Care Education/Training Program

## 2022-08-06 VITALS — BP 142/95 | HR 108 | Temp 98.1°F | Resp 16 | Ht 62.0 in | Wt 111.0 lb

## 2022-08-06 DIAGNOSIS — Z96611 Presence of right artificial shoulder joint: Secondary | ICD-10-CM | POA: Diagnosis not present

## 2022-08-06 DIAGNOSIS — G8929 Other chronic pain: Secondary | ICD-10-CM | POA: Diagnosis not present

## 2022-08-06 DIAGNOSIS — M5010 Cervical disc disorder with radiculopathy, unspecified cervical region: Secondary | ICD-10-CM | POA: Diagnosis not present

## 2022-08-06 DIAGNOSIS — M75101 Unspecified rotator cuff tear or rupture of right shoulder, not specified as traumatic: Secondary | ICD-10-CM | POA: Diagnosis not present

## 2022-08-06 DIAGNOSIS — G894 Chronic pain syndrome: Secondary | ICD-10-CM

## 2022-08-06 DIAGNOSIS — M5416 Radiculopathy, lumbar region: Secondary | ICD-10-CM | POA: Insufficient documentation

## 2022-08-06 DIAGNOSIS — M5412 Radiculopathy, cervical region: Secondary | ICD-10-CM | POA: Insufficient documentation

## 2022-08-06 DIAGNOSIS — M12811 Other specific arthropathies, not elsewhere classified, right shoulder: Secondary | ICD-10-CM | POA: Insufficient documentation

## 2022-08-06 DIAGNOSIS — M25511 Pain in right shoulder: Secondary | ICD-10-CM | POA: Insufficient documentation

## 2022-08-06 MED ORDER — GABAPENTIN 300 MG PO CAPS: 300.0000 mg | ORAL_CAPSULE | Freq: Every day | ORAL | 2 refills | Status: DC

## 2022-08-06 MED ORDER — METHOCARBAMOL 500 MG PO TABS
500.0000 mg | ORAL_TABLET | Freq: Three times a day (TID) | ORAL | 1 refills | Status: AC | PRN
Start: 2022-08-06 — End: ?

## 2022-08-06 NOTE — Progress Notes (Signed)
PROVIDER NOTE: Information contained herein reflects review and annotations entered in association with encounter. Interpretation of such information and data should be left to medically-trained personnel. Information provided to patient can be located elsewhere in the medical record under "Patient Instructions". Document created using STT-dictation technology, any transcriptional errors that may result from process are unintentional.    Patient: Danielle Stephens  Service Category: E/M  Provider: Edward Jolly, MD  DOB: [birth year]  DOS: 08/06/2022  Referring Provider: Marjie Skiff, NP  MRN: [id number]  Specialty: Interventional Pain Management  PCP: Marjie Skiff, NP  Type: Established Patient  Setting: Ambulatory outpatient    Location: Office  Delivery: Face-to-face     HPI  [name], a 72 y.o. year old female, is here today because of her Cervical radicular pain [M54.12]. [name]'s primary complain today is Neck Pain (Right side ) and Leg Pain (Left )  Pertinent problems: [name] has H/O repair of right rotator cuff; Chronic right shoulder pain; History of right shoulder replacement; Right rotator cuff tear arthropathy; Chronic pain syndrome; and Suprascapular entrapment neuropathy of right side on their pertinent problem list. Pain Assessment: Severity of Chronic pain is reported as a 7 /10. Location: Neck (left leg) Right/down the spine.  patient is also c/o of left leg pain, wondering if when she stepped on a piece of glass  is causing the pain.. Onset: More than a month ago. Quality: Burning, Constant, Discomfort, Sharp, Dull. Timing: Constant. Modifying factor(s): epidural did help the neck and shoulder pain.. Vitals:  height is 5\' 2"  (1.575 m) and weight is 111 lb (50.3 kg). Her temporal temperature is 98.1 F (36.7 C). Her blood pressure is 142/95 (abnormal) and her pulse is 108 (abnormal). Her respiration is 16 and oxygen saturation is 98%.  BMI: Estimated body mass index is 20.3 kg/m  as calculated from the following:   Height as of this encounter: 5\' 2"  (1.575 m).   Weight as of this encounter: 111 lb (50.3 kg). Last encounter: 06/15/2022. Last procedure: 05/25/2022.  Reason for encounter: evaluation of worsening, or previously known (established) problem.   -Is awaiting a 2nd opinion to see a neurosurgeon, she did not have a good experience with previous surgeon who informed her that she needs cervical spine surgery. Has done PT for her right arm, had a right cervical epidural steroid injection on 05/25/2022 that provided her with 50% pain relief for about 3 weeks. -She will be going on upcoming medical mission trips and was hoping to have her surgery done before then but understands that this would not be possible. -States that 2-3 weeks ago, she stepped on a piece of glass and since then she has experienced low back pain with radiation into her left leg in a dermatomal fashion. Pain radiates from left buttock, to lateral thigh, and to medial calf in a dermatomal fashion.   Pharmacotherapy Assessment  Analgesic:  Tramadol 50 mg daily prn severe breakthrough pain    Monitoring: [place] PMP: PDMP reviewed during this encounter.       Pharmacotherapy: No side-effects or adverse reactions reported. Compliance: No problems identified. Effectiveness: Clinically acceptable.  Vernie Ammons, RN  08/06/2022 11:35 AM  Sign when Signing Visit Safety precautions to be maintained throughout the outpatient stay will include: orient to surroundings, keep bed in low position, maintain call bell within reach at all times, provide assistance with transfer out of bed and ambulation.     No results found for: "CBDTHCR"  No results found for: "D8THCCBX" No results found for: "D9THCCBX"  UDS:  Summary  Date Value Ref Range Status  12/23/2020 Note  Final    Comment:    ==================================================================== Compliance Drug Analysis,  [place] ==================================================================== Test                             Result       Flag       Units  Drug Present and Declared for Prescription Verification   Tramadol                       290          EXPECTED   ng/mg creat   O-Desmethyltramadol            500          EXPECTED   ng/mg creat    Source of tramadol is a prescription medication. O-desmethyltramadol    is an expected metabolite of tramadol.    Acetaminophen                  PRESENT      EXPECTED  Drug Present not Declared for Prescription Verification   Diphenhydramine                PRESENT      UNEXPECTED  Drug Absent but Declared for Prescription Verification   Oxycodone                      Not Detected UNEXPECTED ng/mg creat   Tizanidine                     Not Detected UNEXPECTED    Tizanidine, as indicated in the declared medication list, is not    always detected even when used as directed.    Methocarbamol                  Not Detected UNEXPECTED   Doxylamine                     Not Detected UNEXPECTED ==================================================================== Test                      Result    Flag   Units      Ref Range   Creatinine              30               mg/dL      >=40 ==================================================================== Declared Medications:  The flagging and interpretation on this report are based on the  following declared medications.  Unexpected results may arise from  inaccuracies in the declared medications.   **Note: The testing scope of this panel includes these medications:   Doxylamine  Methocarbamol (Robaxin)  Oxycodone (Roxicodone)  Tramadol (Ultram)   **Note: The testing scope of this panel does not include small to  moderate amounts of these reported medications:   Acetaminophen (Tylenol)  Tizanidine (Zanaflex)   **Note: The testing scope of this panel does not include the  following reported medications:    Biotin  Fexofenadine (Allegra)  Lisinopril (Zestril)  Melatonin  Meloxicam (Mobic)  Multivitamin  Ondansetron (Zofran)  Vitamin C  Vitamin D ==================================================================== For clinical consultation, please call [phone]. ====================================================================       ROS  Constitutional: Denies any fever  or chills Gastrointestinal: No reported hemesis, hematochezia, vomiting, or acute GI distress Musculoskeletal:  Right arm pain, low back pain with radiation into left leg Neurological: No reported episodes of acute onset apraxia, aphasia, dysarthria, agnosia, amnesia, paralysis, loss of coordination, or loss of consciousness  Medication Review  Vitamin D, amLODipine, gabapentin, lactase, lisinopril, methocarbamol, multivitamin, naproxen, ondansetron, rosuvastatin, and traZODone  History Review  Allergy: [name] is allergic to lactose intolerance (gi) and codeine. Drug: [name]  reports no history of drug use. Alcohol:  reports no history of alcohol use. Tobacco:  reports that she has never smoked. She has never used smokeless tobacco. Social: [name]  reports that she has never smoked. She has never used smokeless tobacco. She reports that she does not drink alcohol and does not use drugs. Medical:  has a past medical history of Anxiety, Chronic kidney disease, Hypertension, and Shoulder pain. Surgical: [name]  has a past surgical history that includes Dilation and curettage, diagnostic / therapeutic; rotary cuff (Right, 03/06/2019); Shoulder arthroscopy (Right, 08/28/2019); Closed manipulation shoulder with steroid injection (Right, 08/28/2019); Colonoscopy; Reverse shoulder arthroplasty (Right, 05/20/2020); Colonoscopy with propofol (N/A, 09/23/2021); and polypectomy (N/A, 09/23/2021). Family: family history includes Cancer in her mother; Down syndrome in her brother; Heart attack in her brother and father;  Hypertension in her father, mother, sister, sister, sister, sister, sister, and sister; Stroke in her sister.  Laboratory Chemistry Profile   Renal Lab Results  Component Value Date   BUN 10 08/04/2022   CREATININE 0.89 08/04/2022   BCR 11 (L) 08/04/2022   GFRAA 71 02/16/2020   GFRNONAA >60 05/16/2020    Hepatic Lab Results  Component Value Date   AST 26 08/04/2022   ALT 14 08/04/2022   ALBUMIN 4.6 08/04/2022   ALKPHOS 66 08/04/2022    Electrolytes Lab Results  Component Value Date   NA 142 08/04/2022   K 4.0 08/04/2022   CL 102 08/04/2022   CALCIUM 9.5 08/04/2022    Bone Lab Results  Component Value Date   VD25OH 44.0 08/04/2022    Inflammation (CRP: Acute Phase) (ESR: Chronic Phase) Lab Results  Component Value Date   CRP <1 04/14/2021   ESRSEDRATE 8 04/14/2021         Note: Above Lab results reviewed.  Recent Imaging Review  CLINICAL DATA:  Right arm radiculopathy   EXAM: MRI CERVICAL SPINE WITHOUT CONTRAST   TECHNIQUE: Multiplanar, multisequence MR imaging of the cervical spine was performed. No intravenous contrast was administered.   COMPARISON:  Cervical spine radiographs 02/04/2022   FINDINGS: Alignment: Normal   Vertebrae: Negative for fracture or mass   Cord: Normal signal and morphology   Posterior Fossa, vertebral arteries, paraspinal tissues: No significant abnormality. Small bilateral perineural cysts around the nerve roots at C5-6, C6-7, C7-T1.   Disc levels:   C2-3: Negative   C3-4: Negative   C4-5: Mild disc and mild facet degeneration. Mild central canal stenosis and mild foraminal stenosis on the left.   C4-5: Disc degeneration with diffuse uncinate spurring. Right paracentral disc protrusion and spurring with cord flattening on the right. Mild spinal stenosis. Neural foramina patent bilaterally   C5-6: Disc degeneration with mild spurring. No significant spinal or foraminal stenosis   C7-T1: Negative    IMPRESSION: 1. Mild central canal stenosis and mild foraminal stenosis on the left at C4-5. 2. Right paracentral disc protrusion and spurring at C5-6 with cord flattening on the right.     Electronically Signed   By:  Marlan Palau M.D.   On: 03/25/2022 11:43     Note: Reviewed        Physical Exam  General appearance: Well nourished, well developed, and well hydrated. In no apparent acute distress Mental status: Alert, oriented x 3 (person, place, & time)       Respiratory: No evidence of acute respiratory distress Eyes: PERLA Vitals: BP (!) 142/95 (BP Location: Right Arm, Patient Position: Sitting, Cuff Size: Normal)   Pulse (!) 108   Temp 98.1 F (36.7 C) (Temporal)   Resp 16   Ht 5\' 2"  (1.575 m)   Wt 111 lb (50.3 kg)   SpO2 98%   BMI 20.30 kg/m  BMI: Estimated body mass index is 20.3 kg/m as calculated from the following:   Height as of this encounter: 5\' 2"  (1.575 m).   Weight as of this encounter: 111 lb (50.3 kg). Ideal: Ideal body weight: 50.1 kg (110 lb 7.2 oz) Adjusted ideal body weight: 50.2 kg (110 lb 10.7 oz)  Cervical Spine Area Exam  Skin & Axial Inspection: No masses, redness, edema, swelling, or associated skin lesions Alignment: Symmetrical Functional ROM: Unrestricted ROM      Stability: No instability detected Muscle Tone/Strength: Functionally intact. No obvious neuro-muscular anomalies detected. Sensory (Neurological): Dermatomal pain pattern on the right Palpation: No palpable anomalies             Upper Extremity (UE) Exam      Side: Right upper extremity   Side: Left upper extremity  Skin & Extremity Inspection: Skin color, temperature, and hair growth are WNL. No peripheral edema or cyanosis. No masses, redness, swelling, asymmetry, or associated skin lesions. No contractures.   Skin & Extremity Inspection: Skin color, temperature, and hair growth are WNL. No peripheral edema or cyanosis. No masses, redness, swelling, asymmetry, or associated  skin lesions. No contractures.  Functional ROM: Pain restricted ROM for shoulder and elbow   Functional ROM: Unrestricted ROM          Muscle Tone/Strength: Functionally intact. No obvious neuro-muscular anomalies detected.   Muscle Tone/Strength: Functionally intact. No obvious neuro-muscular anomalies detected.  Sensory (Neurological): Neurogenic pain pattern           Sensory (Neurological): Unimpaired          Palpation: No palpable anomalies               Palpation: No palpable anomalies              Provocative Test(s):  Phalen's test: deferred Tinel's test: deferred Apley's scratch test (touch opposite shoulder):  Action 1 (Across chest): Decreased ROM Action 2 (Overhead): Decreased ROM Action 3 (LB reach): Decreased ROM     Provocative Test(s):  Phalen's test: deferred Tinel's test: deferred Apley's scratch test (touch opposite shoulder):  Action 1 (Across chest): deferred Action 2 (Overhead): deferred Action 3 (LB reach): deferred    Lumbar Spine Area Exam  Skin & Axial Inspection: No masses, redness, or swelling Alignment: Symmetrical Functional ROM: Pain restricted ROM affecting primarily the left Stability: No instability detected Muscle Tone/Strength: Functionally intact. No obvious neuro-muscular anomalies detected. Sensory (Neurological): Dermatomal pain pattern Palpation: No palpable anomalies       Provocative Tests:  Lumbar quadrant test (Kemp's test): (+) on the left for foraminal stenosis  Gait & Posture Assessment  Ambulation: Unassisted Gait: Relatively normal for age and body habitus Posture: WNL  Lower Extremity Exam    Side: Right lower extremity  Side: Left lower extremity  Stability: No instability observed          Stability: No instability observed          Skin & Extremity Inspection: Skin color, temperature, and hair growth are WNL. No peripheral edema or cyanosis. No masses, redness, swelling, asymmetry, or associated skin lesions. No  contractures.  Skin & Extremity Inspection: Skin color, temperature, and hair growth are WNL. No peripheral edema or cyanosis. No masses, redness, swelling, asymmetry, or associated skin lesions. No contractures.  Functional ROM: Unrestricted ROM                  Functional ROM: Unrestricted ROM                  Muscle Tone/Strength: Functionally intact. No obvious neuro-muscular anomalies detected.  Muscle Tone/Strength: Functionally intact. No obvious neuro-muscular anomalies detected.  Sensory (Neurological): Unimpaired        Sensory (Neurological): Unimpaired        DTR: Patellar: deferred today Achilles: deferred today Plantar: deferred today  DTR: Patellar: deferred today Achilles: deferred today Plantar: deferred today  Palpation: No palpable anomalies  Palpation: No palpable anomalies    Assessment   Diagnosis Status  1. Cervical radicular pain   2. Herniation of cervical intervertebral disc with radiculopathy   3. Chronic pain syndrome   4. Lumbar radicular pain (left L4/5)   5. Chronic right shoulder pain   6. History of right shoulder replacement   7. Right rotator cuff tear arthropathy    Persistent Persistent Controlled     Plan of Care  1.  Increase gabapentin to 300 mg nightly. 2.  Refill of Robaxin as below. 3.  Continue tramadol as needed for severe breakthrough pain, no refills needed.  Patient takes very seldomly. 4.  For lumbar radicular pain, trial left L4-L5 ESI.  5.  For cervical radicular pain, patient would appreciate a second opinion, referral to Dr. Marcell Barlow.    Pharmacotherapy (Medications Ordered): Meds ordered this encounter  Medications   gabapentin (NEURONTIN) 300 MG capsule    Sig: Take 1 capsule (300 mg total) by mouth at bedtime.    Dispense:  90 capsule    Refill:  2   methocarbamol (ROBAXIN) 500 MG tablet    Sig: Take 1 tablet (500 mg total) by mouth every 8 (eight) hours as needed for muscle spasms.    Dispense:  90 tablet     Refill:  1    Do not place this medication, or any other prescription from our practice, on "Automatic Refill". Patient may have prescription filled one day early if pharmacy is closed on scheduled refill date.   Orders:  Orders Placed This Encounter  Procedures   Lumbar Epidural Injection    Standing Status:   Future    Standing Expiration Date:   11/06/2022    Scheduling Instructions:     Procedure: Interlaminar Lumbar Epidural Steroid injection (LESI)            Laterality: LEFT L4/5     Sedation: local      Timeframe: ASAA    Order Specific Question:   Where will this procedure be performed?    Answer:   [facility]   Ambulatory referral to Neurosurgery    Referral Priority:   Routine    Referral Type:   Surgical    Referral Reason:   Specialty Services Required    Referred to Provider:   Venetia Night, MD    Requested Specialty:  Neurosurgery    Number of Visits Requested:   1   Follow-up plan:   Return in about 27 days (around 09/02/2022) for Left L4/5 ESI , in clinic [facility].      Right SSNB 01/06/21, 08/06/21 , 10/08/21, 01/21/22; C-ESI 05/25/22        Recent Visits Date Type Provider Dept  06/15/22 Office Visit Edward Jolly, MD [facility]  05/25/22 Procedure visit Edward Jolly, MD [facility]  05/12/22 Office Visit Edward Jolly, MD [facility]  Showing recent visits within past 90 days and meeting all other requirements Today's Visits Date Type Provider Dept  08/06/22 Office Visit Edward Jolly, MD [facility]  Showing today's visits and meeting all other requirements Future Appointments No visits were found meeting these conditions. Showing future appointments within next 90 days and meeting all other requirements  I discussed the assessment and treatment plan with the patient. The patient was provided an opportunity to ask questions and all were answered. The patient agreed with the plan and demonstrated an  understanding of the instructions.  Patient advised to call back or seek an in-person evaluation if the symptoms or condition worsens.  Duration of encounter: 30 minutes.  Total time on encounter, as per AMA guidelines included both the face-to-face and non-face-to-face time personally spent by the physician and/or other qualified health care professional(s) on the day of the encounter (includes time in activities that require the physician or other qualified health care professional and does not include time in activities normally performed by clinical staff). Physician's time may include the following activities when performed: Preparing to see the patient (e.g., pre-charting review of records, searching for previously ordered imaging, lab work, and nerve conduction tests) Review of prior analgesic pharmacotherapies. Reviewing PMP Interpreting ordered tests (e.g., lab work, imaging, nerve conduction tests) Performing post-procedure evaluations, including interpretation of diagnostic procedures Obtaining and/or reviewing separately obtained history Performing a medically appropriate examination and/or evaluation Counseling and educating the patient/family/caregiver Ordering medications, tests, or procedures Referring and communicating with other health care professionals (when not separately reported) Documenting clinical information in the electronic or other health record Independently interpreting results (not separately reported) and communicating results to the patient/ family/caregiver Care coordination (not separately reported)  Note by: Edward Jolly, MD Date: 08/06/2022; Time: 1:23 PM

## 2022-08-06 NOTE — Progress Notes (Signed)
Safety precautions to be maintained throughout the outpatient stay will include: orient to surroundings, keep bed in low position, maintain call bell within reach at all times, provide assistance with transfer out of bed and ambulation.  

## 2022-08-06 NOTE — Patient Instructions (Signed)
____________________________________________________________________________________________  General Risks and Possible Complications  Patient Responsibilities: It is important that you read this as it is part of your informed consent. It is our duty to inform you of the risks and possible complications associated with treatments offered to you. It is your responsibility as a patient to read this and to ask questions about anything that is not clear or that you believe was not covered in this document.  Patient's Rights: You have the right to refuse treatment. You also have the right to change your mind, even after initially having agreed to have the treatment done. However, under this last option, if you wait until the last second to change your mind, you may be charged for the materials used up to that point.  Introduction: Medicine is not an exact science. Everything in Medicine, including the lack of treatment(s), carries the potential for danger, harm, or loss (which is by definition: Risk). In Medicine, a complication is a secondary problem, condition, or disease that can aggravate an already existing one. All treatments carry the risk of possible complications. The fact that a side effects or complications occurs, does not imply that the treatment was conducted incorrectly. It must be clearly understood that these can happen even when everything is done following the highest safety standards.  No treatment: You can choose not to proceed with the proposed treatment alternative. The "PRO(s)" would include: avoiding the risk of complications associated with the therapy. The "CON(s)" would include: not getting any of the treatment benefits. These benefits fall under one of three categories: diagnostic; therapeutic; and/or palliative. Diagnostic benefits include: getting information which can ultimately lead to improvement of the disease or symptom(s). Therapeutic benefits are those associated with  the successful treatment of the disease. Finally, palliative benefits are those related to the decrease of the primary symptoms, without necessarily curing the condition (example: decreasing the pain from a flare-up of a chronic condition, such as incurable terminal cancer).  General Risks and Complications: These are associated to most interventional treatments. They can occur alone, or in combination. They fall under one of the following six (6) categories: no benefit or worsening of symptoms; bleeding; infection; nerve damage; allergic reactions; and/or death. No benefits or worsening of symptoms: In Medicine there are no guarantees, only probabilities. No healthcare provider can ever guarantee that a medical treatment will work, they can only state the probability that it may. Furthermore, there is always the possibility that the condition may worsen, either directly, or indirectly, as a consequence of the treatment. Bleeding: This is more common if the patient is taking a blood thinner, either prescription or over the counter (example: Goody Powders, Fish oil, Aspirin, Garlic, etc.), or if suffering a condition associated with impaired coagulation (example: Hemophilia, cirrhosis of the liver, low platelet counts, etc.). However, even if you do not have one on these, it can still happen. If you have any of these conditions, or take one of these drugs, make sure to notify your treating physician. Infection: This is more common in patients with a compromised immune system, either due to disease (example: diabetes, cancer, human immunodeficiency virus [HIV], etc.), or due to medications or treatments (example: therapies used to treat cancer and rheumatological diseases). However, even if you do not have one on these, it can still happen. If you have any of these conditions, or take one of these drugs, make sure to notify your treating physician. Nerve Damage: This is more common when the treatment is an    invasive one, but it can also happen with the use of medications, such as those used in the treatment of cancer. The damage can occur to small secondary nerves, or to large primary ones, such as those in the spinal cord and brain. This damage may be temporary or permanent and it may lead to impairments that can range from temporary numbness to permanent paralysis and/or brain death. Allergic Reactions: Any time a substance or material comes in contact with our body, there is the possibility of an allergic reaction. These can range from a mild skin rash (contact dermatitis) to a severe systemic reaction (anaphylactic reaction), which can result in death. Death: In general, any medical intervention can result in death, most of the time due to an unforeseen complication. ____________________________________________________________________________________________    ______________________________________________________________________  Preparing for your procedure  Appointments: If you think you may not be able to keep your appointment, call 24-48 hours in advance to cancel. We need time to make it available to others.  During your procedure appointment there will be: No Prescription Refills. No disability issues to discussed. No medication changes or discussions.  Instructions: Food intake: Avoid eating anything solid for at least 8 hours prior to your procedure. Clear liquid intake: You may take clear liquids such as water up to 2 hours prior to your procedure. (No carbonated drinks. No soda.) Transportation: Unless otherwise stated by your physician, bring a driver. Morning Medicines: Except for blood thinners, take all of your other morning medications with a sip of water. Make sure to take your heart and blood pressure medicines. If your blood pressure's lower number is above 100, the case will be rescheduled. Blood thinners: Make sure to stop your blood thinners as instructed.  If you take  a blood thinner, but were not instructed to stop it, call our office [phone] and ask to talk to a nurse. Not stopping a blood thinner prior to certain procedures could lead to serious complications. Diabetics on insulin: Notify the staff so that you can be scheduled 1st case in the morning. If your diabetes requires high dose insulin, take only  of your normal insulin dose the morning of the procedure and notify the staff that you have done so. Preventing infections: Shower with an antibacterial soap the morning of your procedure.  Build-up your immune system: Take 1000 mg of Vitamin C with every meal (3 times a day) the day prior to your procedure. Antibiotics: Inform the nursing staff if you are taking any antibiotics or if you have any conditions that may require antibiotics prior to procedures. (Example: recent joint implants)   Pregnancy: If you are pregnant make sure to notify the nursing staff. Not doing so may result in injury to the fetus, including death.  Sickness: If you have a cold, fever, or any active infections, call and cancel or reschedule your procedure. Receiving steroids while having an infection may result in complications. Arrival: You must be in the facility at least 30 minutes prior to your scheduled procedure. Tardiness: Your scheduled time is also the cutoff time. If you do not arrive at least 15 minutes prior to your procedure, you will be rescheduled.  Children: Do not bring any children with you. Make arrangements to keep them home. Dress appropriately: There is always a possibility that your clothing may get soiled. Avoid long dresses. Valuables: Do not bring any jewelry or valuables.  Reasons to call and reschedule or cancel your procedure: (Following these recommendations will minimize the risk   of a serious complication.) Surgeries: Avoid having procedures within 2 weeks of any surgery. (Avoid for 2 weeks before or after any surgery). Flu Shots: Avoid having  procedures within 2 weeks of a flu shots or . (Avoid for 2 weeks before or after immunizations). Barium: Avoid having a procedure within 7-10 days after having had a radiological study involving the use of radiological contrast. (Myelograms, Barium swallow or enema study). Heart attacks: Avoid any elective procedures or surgeries for the initial 6 months after a "Myocardial Infarction" (Heart Attack). Blood thinners: It is imperative that you stop these medications before procedures. Let us know if you if you take any blood thinner.  Infection: Avoid procedures during or within two weeks of an infection (including chest colds or gastrointestinal problems). Symptoms associated with infections include: Localized redness, fever, chills, night sweats or profuse sweating, burning sensation when voiding, cough, congestion, stuffiness, runny nose, sore throat, diarrhea, nausea, vomiting, cold or Flu symptoms, recent or current infections. It is specially important if the infection is over the area that we intend to treat. Heart and lung problems: Symptoms that may suggest an active cardiopulmonary problem include: cough, chest pain, breathing difficulties or shortness of breath, dizziness, ankle swelling, uncontrolled high or unusually low blood pressure, and/or palpitations. If you are experiencing any of these symptoms, cancel your procedure and contact your primary care physician for an evaluation.  Remember:  Regular Business hours are:  Monday to Thursday 8:00 AM to 4:00 PM  Provider's Schedule: Francisco Naveira, MD:  Procedure days: Tuesday and Thursday 7:30 AM to 4:00 PM  Bilal Lateef, MD:  Procedure days: Monday and Wednesday 7:30 AM to 4:00 PM  ______________________________________________________________________   Epidural Steroid Injection Patient Information  Description: The epidural space surrounds the nerves as they exit the spinal cord.  In some patients, the nerves can be  compressed and inflamed by a bulging disc or a tight spinal canal (spinal stenosis).  By injecting steroids into the epidural space, we can bring irritated nerves into direct contact with a potentially helpful medication.  These steroids act directly on the irritated nerves and can reduce swelling and inflammation which often leads to decreased pain.  Epidural steroids may be injected anywhere along the spine and from the neck to the low back depending upon the location of your pain.   After numbing the skin with local anesthetic (like Novocaine), a small needle is passed into the epidural space slowly.  You may experience a sensation of pressure while this is being done.  The entire block usually last less than 10 minutes.  Conditions which may be treated by epidural steroids:  Low back and leg pain Neck and arm pain Spinal stenosis Post-laminectomy syndrome Herpes zoster (shingles) pain Pain from compression fractures  Preparation for the injection:  Do not eat any solid food or dairy products within 8 hours of your appointment.  You may drink clear liquids up to 3 hours before appointment.  Clear liquids include water, black coffee, juice or soda.  No milk or cream please. You may take your regular medication, including pain medications, with a sip of water before your appointment  Diabetics should hold regular insulin (if taken separately) and take 1/2 normal NPH dos the morning of the procedure.  Carry some sugar containing items with you to your appointment. A driver must accompany you and be prepared to drive you home after your procedure.  Bring all your current medications with your. An IV may be inserted and   sedation may be given at the discretion of the physician.   A blood pressure cuff, EKG and other monitors will often be applied during the procedure.  Some patients may need to have extra oxygen administered for a short period. You will be asked to provide medical information,  including your allergies, prior to the procedure.  We must know immediately if you are taking blood thinners (like Coumadin/Warfarin)  Or if you are allergic to IV iodine contrast (dye). We must know if you could possible be pregnant.  Possible side-effects: Bleeding from needle site Infection (rare, may require surgery) Nerve injury (rare) Numbness & tingling (temporary) Difficulty urinating (rare, temporary) Spinal headache ( a headache worse with upright posture) Light -headedness (temporary) Pain at injection site (several days) Decreased blood pressure (temporary) Weakness in arm/leg (temporary) Pressure sensation in back/neck (temporary)  Call if you experience: Fever/chills associated with headache or increased back/neck pain. Headache worsened by an upright position. New onset weakness or numbness of an extremity below the injection site Hives or difficulty breathing (go to the emergency room) Inflammation or drainage at the infection site Severe back/neck pain Any new symptoms which are concerning to you  Please note:  Although the local anesthetic injected can often make your back or neck feel good for several hours after the injection, the pain will likely return.  It takes 3-7 days for steroids to work in the epidural space.  You may not notice any pain relief for at least that one week.  If effective, we will often do a series of three injections spaced 3-6 weeks apart to maximally decrease your pain.  After the initial series, we generally will wait several months before considering a repeat injection of the same type.  If you have any questions, please call [phone] [facility] 

## 2022-08-07 ENCOUNTER — Encounter: Payer: Self-pay | Admitting: Nurse Practitioner

## 2022-08-07 ENCOUNTER — Telehealth (INDEPENDENT_AMBULATORY_CARE_PROVIDER_SITE_OTHER): Payer: 59 | Admitting: Nurse Practitioner

## 2022-08-07 DIAGNOSIS — M79672 Pain in left foot: Secondary | ICD-10-CM

## 2022-08-07 NOTE — Progress Notes (Signed)
There were no vitals taken for this visit.   Subjective:    Patient ID: Danielle Stephens, female    DOB: Aug 30, 1950, 72 y.o.   MRN: [id number]  HPI: Danielle Stephens is a 72 y.o. female  Chief Complaint  Patient presents with   Foot Pain    Left foot pain on bottom, stepped on glass on 5/8, seen Dr. Cherylann Ratel, going out of town on  6/6 to 6/15, wants to make sure everything is ok   This visit was completed via video visit through MyChart due to the restrictions of the COVID-19 pandemic. All issues as above were discussed and addressed. Physical exam was done as above through visual confirmation on video through MyChart. If it was felt that the patient should be evaluated in the office, they were directed there. The patient verbally consented to this visit. Location of the patient: home Location of the provider: work Those involved with this call:  Provider: Aura Dials, DNP CMA: Tristan Schroeder, CMA Front Desk/Registration: [employer]  Time spent on call:  21 minutes with patient face to face via video conference. More than 50% of this time was spent in counseling and coordination of care. 15 minutes total spent in review of patient's record and preparation of their chart.  I verified patient identity using two factors (patient name and date of birth). Patient consents verbally to being seen via telemedicine visit today.    FOOT PAIN Reports having left foot pain, was seen by Dr. Cherylann Ratel.  Follows for pain clinic and chronic pain.  Pain start 07/22/2022 after stepping on glass which was sharp Mason jar piece to heel area.  Going out of town soon and wanted to check on this.  Pain starts from back of leg to front of toes.  Dr. Cherylann Ratel has set her up for epidural on June 19th.  Tingles in toes and burns in back of heel, which started after stepping on glass. Duration: weeks Involved foot: left Mechanism of injury: trauma Location: left foot pain Onset: gradual  Severity: 5/10  Quality:  dull,  aching, and throbbing Frequency: intermittent Radiation: no Aggravating factors: weight bearing, walking, and movement  Alleviating factors: muscle relaxer  Status: fluctuating Treatments attempted: muscle relaxer and warm soaks Relief with NSAIDs?:  No NSAIDs Taken Weakness with weight bearing or walking: yes at times, the heel hurts a lot Morning stiffness: no Swelling: no Redness: no Bruising: no Paresthesias / decreased sensation: yes in toes Fevers:no   Relevant past medical, surgical, family and social history reviewed and updated as indicated. Interim medical history since our last visit reviewed. Allergies and medications reviewed and updated.  Review of Systems  Constitutional:  Negative for activity change, appetite change, diaphoresis, fatigue and fever.  Respiratory:  Negative for cough, chest tightness and shortness of breath.   Cardiovascular:  Negative for chest pain, palpitations and leg swelling.  Gastrointestinal: Negative.   Musculoskeletal:  Positive for arthralgias.  Neurological: Negative.   Psychiatric/Behavioral: Negative.      Per HPI unless specifically indicated above     Objective:    There were no vitals taken for this visit.  Wt Readings from Last 3 Encounters:  08/06/22 111 lb (50.3 kg)  08/04/22 111 lb 3.2 oz (50.4 kg)  06/23/22 110 lb 8 oz (50.1 kg)    Physical Exam Vitals and nursing note reviewed.  Constitutional:      General: She is awake. She is not in acute distress.    Appearance:  She is well-developed and well-groomed. She is not ill-appearing or toxic-appearing.  HENT:     Head: Normocephalic.     Right Ear: Hearing normal.     Left Ear: Hearing normal.  Eyes:     General: Lids are normal.        Right eye: No discharge.        Left eye: No discharge.     Conjunctiva/sclera: Conjunctivae normal.  Pulmonary:     Effort: Pulmonary effort is normal. No accessory muscle usage or respiratory distress.  Musculoskeletal:      Cervical back: Normal range of motion.  Neurological:     Mental Status: She is alert and oriented to person, place, and time.  Psychiatric:        Attention and Perception: Attention normal.        Mood and Affect: Mood normal.        Behavior: Behavior normal. Behavior is cooperative.        Thought Content: Thought content normal.        Judgment: Judgment normal.    Results for orders placed or performed in visit on 08/04/22  CBC with Differential/Platelet  Result Value Ref Range   WBC 4.2 3.4 - 10.8 x10E3/uL   RBC 4.88 3.77 - 5.28 x10E6/uL   Hemoglobin 13.4 11.1 - 15.9 g/dL   Hematocrit 16.1 09.6 - 46.6 %   MCV 84 79 - 97 fL   MCH 27.5 26.6 - 33.0 pg   MCHC 32.6 31.5 - 35.7 g/dL   RDW 04.5 40.9 - 81.1 %   Platelets 196 150 - 450 x10E3/uL   Neutrophils 66 Not Estab. %   Lymphs 21 Not Estab. %   Monocytes 8 Not Estab. %   Eos 4 Not Estab. %   Basos 1 Not Estab. %   Neutrophils Absolute 2.8 1.4 - 7.0 x10E3/uL   Lymphocytes Absolute 0.9 0.7 - 3.1 x10E3/uL   Monocytes Absolute 0.3 0.1 - 0.9 x10E3/uL   EOS (ABSOLUTE) 0.2 0.0 - 0.4 x10E3/uL   Basophils Absolute 0.0 0.0 - 0.2 x10E3/uL   Immature Granulocytes 0 Not Estab. %   Immature Grans (Abs) 0.0 0.0 - 0.1 x10E3/uL  Comprehensive metabolic panel  Result Value Ref Range   Glucose 94 70 - 99 mg/dL   BUN 10 8 - 27 mg/dL   Creatinine, Ser 9.14 0.57 - 1.00 mg/dL   eGFR 70 >78 GN/FAO/1.30   BUN/Creatinine Ratio 11 (L) 12 - 28   Sodium 142 134 - 144 mmol/L   Potassium 4.0 3.5 - 5.2 mmol/L   Chloride 102 96 - 106 mmol/L   CO2 23 20 - 29 mmol/L   Calcium 9.5 8.7 - 10.3 mg/dL   Total Protein 7.1 6.0 - 8.5 g/dL   Albumin 4.6 3.9 - 4.9 g/dL   Globulin, Total 2.5 1.5 - 4.5 g/dL   Albumin/Globulin Ratio 1.8 1.2 - 2.2   Bilirubin Total 0.4 0.0 - 1.2 mg/dL   Alkaline Phosphatase 66 44 - 121 IU/L   AST 26 0 - 40 IU/L   ALT 14 0 - 32 IU/L  Lipid Panel w/o Chol/HDL Ratio  Result Value Ref Range   Cholesterol, Total 175 100 - 199  mg/dL   Triglycerides 78 0 - 149 mg/dL   HDL 70 >86 mg/dL   VLDL Cholesterol Cal 14 5 - 40 mg/dL   LDL Chol Calc (NIH) 91 0 - 99 mg/dL  VITAMIN D 25 Hydroxy (Vit-D Deficiency, Fractures)  Result Value  Ref Range   Vit D, 25-Hydroxy 44.0 30.0 - 100.0 ng/mL  TSH  Result Value Ref Range   TSH 2.640 0.450 - 4.500 uIU/mL      Assessment & Plan:   Problem List Items Addressed This Visit       Other   Left foot pain - Primary    Acute, after stepping on glass 07/22/2022.  Continues to have left heel pain mainly.  She is leaving country on 08[place]20[place]22 and is concerned about mobility.  Recommend she continue Gabapentin and collaboration with pain clinic.  May place Voltaren gel to foot and ensure stretching foot daily.  Referral to podiatry for further assessment, ?need for injection to heel area.      Relevant Orders   Ambulatory referral to Podiatry    I discussed the assessment and treatment plan with the patient. The patient was provided an opportunity to ask questions and all were answered. The patient agreed with the plan and demonstrated an understanding of the instructions.   The patient was advised to call back or seek an in-person evaluation if the symptoms worsen or if the condition fails to improve as anticipated.   I provided 21+ minutes of time during this encounter.    Follow up plan: Return if symptoms worsen or fail to improve.

## 2022-08-07 NOTE — Patient Instructions (Signed)
Foot Pain Many things can cause foot pain. Common causes include injuries to the foot. The injuries include sprains or broken bones, or injuries that affect the nerves in the feet. Other causes of foot pain include arthritis, blisters, and bunions. To know what causes your foot pain, your health care provider will take a detailed history of your symptoms. They will also do a physical exam as well as imaging tests, such as X-ray or MRI. Follow these instructions at home: Managing pain, stiffness, and swelling  If told, put ice on the painful area. Put ice in a plastic bag. Place a towel between your skin and the bag. Leave the ice on for 20 minutes, 2-3 times a day. If your skin turns bright red, remove the ice right away to prevent skin damage. The risk of damage is higher if you cannot feel pain, heat, or cold. Activity Do not stand or walk for long periods. Do stretches to relieve foot pain and stiffness as told by your provider. Do not lift anything that is heavier than 10 lb (4.5 kg), or the limit that you are told, until your provider says that it is safe. Lifting a lot of weight can put added pressure on your feet. Return to your normal activities as told by your provider. Ask your provider what activities are safe for you. Lifestyle Wear comfortable, supportive shoes that fit you well. Do not wear high heels. Keep your feet clean and dry. General instructions Take over-the-counter and prescription medicines only as told by your provider. Rub your foot gently. Pay attention to any changes in your symptoms. Let your provider know if symptoms become worse. Keep all follow-up visits. Your provider will want to monitor your progress. Contact a health care provider if: Your pain does not get better after a few days of treatment at home. Your pain gets worse. You cannot stand on your foot. Your foot or toes are swollen. Your foot is numb or tingling. Get help right away if: Your foot  or toes turn white or blue. You have warmth and redness along your foot. This information is not intended to replace advice given to you by your health care provider. Make sure you discuss any questions you have with your health care provider. Document Revised: 03/26/2022 Document Reviewed: 12/02/2021 Elsevier Patient Education  2024 [employer]  

## 2022-08-07 NOTE — Assessment & Plan Note (Signed)
Acute, after stepping on glass 07/22/2022.  Continues to have left heel pain mainly.  [place] country on [place] and is concerned about mobility.  Recommend she continue Gabapentin and collaboration with pain clinic.  May place Voltaren gel to foot and ensure stretching foot daily.  Referral to podiatry for further assessment, ?need for injection to heel area.

## 2022-08-13 ENCOUNTER — Telehealth: Payer: Self-pay | Admitting: Student in an Organized Health Care Education/Training Program

## 2022-08-13 NOTE — Telephone Encounter (Signed)
PT stated that she is in a lot pain, and will be leaving to go out of town on the 6th. Procedure is scheduled for the 19th. PT will like to know what can be done to help with the pain that she is experiencing. Please give patient a call. TY

## 2022-08-14 NOTE — Telephone Encounter (Signed)
Spoke with patient.  She is worried about going on a mission trip 08-21-2022 for a week.  She is having a lot of pain and isnt scheduled for a procedure until 09-02-2022.  Is there anything you can give her to help her throught the trip. She is having difficulty walking due to leg pain.  She is currently taking Methacarbamol but she has to break it in half because it makes her dizzy.  Voltaren gel helps a little. She has tried compression socks but that isnt helping.  She had naprosyn ordered but states you told her not to take that.  Her only allergy is to codeine.  Didn't know if you had any suggestions. Thanks

## 2022-08-17 ENCOUNTER — Telehealth: Payer: Self-pay

## 2022-08-17 NOTE — Telephone Encounter (Signed)
Patient notified that she could take the Naprosyn but not to take any other non steroidal antiinflammatory medications such as Ibuprofen aleve.Marland KitchenMarland Kitchen

## 2022-08-19 ENCOUNTER — Ambulatory Visit: Payer: 59 | Admitting: Podiatry

## 2022-08-19 ENCOUNTER — Other Ambulatory Visit: Payer: Self-pay | Admitting: Podiatry

## 2022-08-19 ENCOUNTER — Ambulatory Visit (INDEPENDENT_AMBULATORY_CARE_PROVIDER_SITE_OTHER): Payer: 59 | Admitting: Podiatry

## 2022-08-19 ENCOUNTER — Ambulatory Visit (INDEPENDENT_AMBULATORY_CARE_PROVIDER_SITE_OTHER): Payer: 59

## 2022-08-19 DIAGNOSIS — S90852A Superficial foreign body, left foot, initial encounter: Secondary | ICD-10-CM

## 2022-08-19 NOTE — Progress Notes (Signed)
  Subjective:  Patient ID: [name], female    DOB: 1951/02/16,  MRN: [id number]  Chief Complaint  Patient presents with   Foreign Body    left foot pain from stepping on piece of glass    72 y.o. female presents with the above complaint. History confirmed with patient.  She stepped on a long piece of glass on 07/22/2022, her husband pulled it out not sure if they got it all out  Objective:  Physical Exam: warm, good capillary refill, no trophic changes or ulcerative lesions, normal DP and PT pulses, normal sensory exam, and puncture wound left heel present no signs of infection drainage appears to be healing well.   Radiographs: Multiple views x-ray of the left foot: Skin marker on plantar heel is present, there is a small radiodensity superior to this but not clear if this is the foreign body or not as appears to be consistent with also fat stranding in the area Assessment:   1. Foreign body of left heel      Plan:  Patient was evaluated and treated and all questions answered.  Radiographs taken today were indeterminant to determine the presence of the possible foreign body may be a piece of broken glass in years.  Still tender.  Has been about 1 month since the injury.  I recommended ultrasound to confirm presence of foreign body.  If there is still foreign body present would recommend surgical excision and removal.  If no foreign body present should heal with time as likely is a granuloma.  Follow-up with me 1 to 2 weeks after the ultrasound is completed.  Referral sent.  No follow-ups on file.

## 2022-08-19 NOTE — Patient Instructions (Signed)
Call [facility] to schedule your ultrasound at the below locations.  Please allow at least 1 business day after your visit to process the referral.  It may take longer depending on approval from insurance.  Please let me know if you have issues or problems scheduling the ultrasound   Once the ultrasound has been scheduled please schedule follow-up visit 7 to 10 days after this with me   [facility] [place] [phone] [address] [place] [address] [place], [place] [address]  [place] [phone] [address] [place], [place] [address]

## 2022-08-23 NOTE — Progress Notes (Signed)
Erroneous encounter created on same date of service

## 2022-08-31 NOTE — Progress Notes (Unsigned)
Referring Physician:  Edward Jolly, MD [address] [place],  [place] [address]  Primary Physician:  Marjie Skiff, NP  History of Present Illness: 09/01/2022 [name] is here today with a chief complaint of neck and back pain.  She also has pain in the right arm.  She had several shoulder procedures over the past 2 years.  She has significant pain with movement of her right arm around her shoulder.  She has a small amount of tingling in her right hand.  She has significant neck pain and tightness.  This pain has been worsening over the past couple of years.  She is also suffering from left leg pain.  It is currently not bothering her too much, but she can have as bad as 7 out of 10 discomfort in her left leg.  She has responded well to epidural steroid injections in the past.  Using her arm makes her neck pain worse.  Flexing her neck also makes it worse.  Bowel/Bladder Dysfunction: none  Conservative measures: EMG 05/14/22 Physical therapy:  denies Multimodal medical therapy including regular antiinflammatories: naproxen, trazodone  Injections:  epidural steroid injections  Right cervical  C7- T1 epidural steroid injection on 05/25/2022 that provided her with 50% pain relief for about 3 weeks.   Right shoulder ESIS: 01/21/22, 10/08/21,08/06/21,01/06/22  Past Surgery:   denies    Corrie Dandy A Sluder has no symptoms of cervical myelopathy.  The symptoms are causing a significant impact on the patient's life.   I have utilized the care everywhere function in epic to review the outside records available from external health systems.  Review of Systems:  A 10 point review of systems is negative, except for the pertinent positives and negatives detailed in the HPI.  Past Medical History: Past Medical History:  Diagnosis Date   Anxiety    Chronic kidney disease    STAGE 3   Hypertension    Shoulder pain     Past Surgical History: Past Surgical History:  Procedure  Laterality Date   CLOSED MANIPULATION SHOULDER WITH STERIOD INJECTION Right 08/28/2019   Procedure: MANIPULATION SHOULDER WITH STEROID INJECTION x2;  Surgeon: Signa Kell, MD;  Location: [facility];  Service: Orthopedics;  Laterality: Right;   COLONOSCOPY     COLONOSCOPY WITH PROPOFOL N/A 09/23/2021   Procedure: COLONOSCOPY WITH PROPOFOL;  Surgeon: Toney Reil, MD;  Location: [facility];  Service: Endoscopy;  Laterality: N/A;  ONE ASCENDING COLON POLYP NOT RETRIEVED   DILATION AND CURETTAGE, DIAGNOSTIC / THERAPEUTIC     POLYPECTOMY N/A 09/23/2021   Procedure: POLYPECTOMY;  Surgeon: Toney Reil, MD;  Location: [facility];  Service: Endoscopy;  Laterality: N/A;   REVERSE SHOULDER ARTHROPLASTY Right 05/20/2020   Procedure: Right reverse shoulder arthroplasty, biceps tenodesis;  Surgeon: Signa Kell, MD;  Location: [facility];  Service: Orthopedics;  Laterality: Right;   rotary cuff Right 03/06/2019   SHOULDER ARTHROSCOPY Right 08/28/2019   Procedure: Right shoulder arthroscopic capsular release, lysis of adhesion,;  Surgeon: Signa Kell, MD;  Location: [facility];  Service: Orthopedics;  Laterality: Right;    Allergies: Allergies as of 09/01/2022 - Review Complete 09/01/2022  Allergen Reaction Noted   Lactose intolerance (gi) Diarrhea 07/23/2021   Codeine Rash 03/24/2017    Medications:  Current Outpatient Medications:    amLODipine (NORVASC) 2.5 MG tablet, Take 1 tablet (2.5 mg total) by mouth daily., Disp: 90 tablet, Rfl: 4   Cholecalciferol (VITAMIN D) 50 MCG (2000 UT)  CAPS, Take 2,000 Units by mouth daily. gummie, Disp: , Rfl:    gabapentin (NEURONTIN) 300 MG capsule, Take 1 capsule (300 mg total) by mouth at bedtime., Disp: 90 capsule, Rfl: 2   lactase (LACTAID) 3000 units tablet, Take 1 tablet by mouth 3 (three) times daily with meals., Disp: , Rfl:    lisinopril (ZESTRIL) 20 MG tablet, TAKE 1 TABLET BY MOUTH ONCE DAILY, Disp: 90 tablet, Rfl: 4    methocarbamol (ROBAXIN) 500 MG tablet, Take 1 tablet (500 mg total) by mouth every 8 (eight) hours as needed for muscle spasms., Disp: 90 tablet, Rfl: 1   Multiple Vitamin (MULTIVITAMIN) capsule, Take 1 capsule by mouth daily., Disp: , Rfl:    naproxen (NAPROSYN) 500 MG tablet, Take 1 tablet (500 mg total) by mouth 2 (two) times daily as needed., Disp: 30 tablet, Rfl: 0   rosuvastatin (CRESTOR) 20 MG tablet, Take 1 tablet (20 mg total) by mouth daily., Disp: 90 tablet, Rfl: 4   traZODone (DESYREL) 50 MG tablet, Take 0.5-1 tablets (25-50 mg total) by mouth at bedtime as needed for sleep., Disp: 30 tablet, Rfl: 4  Social History: Social History   Tobacco Use   Smoking status: Never   Smokeless tobacco: Never  Vaping Use   Vaping Use: Never used  Substance Use Topics   Alcohol use: No   Drug use: No    Family Medical History: Family History  Problem Relation Age of Onset   Hypertension Mother    Cancer Mother    Hypertension Father    Heart attack Father    Stroke Sister    Hypertension Sister    Heart attack Brother    Hypertension Sister    Hypertension Sister    Hypertension Sister    Hypertension Sister    Hypertension Sister    Down syndrome Brother    Breast cancer Neg Hx     Physical Examination: Vitals:   09/01/22 1047  BP: 124/68    General: Patient is in no apparent distress. Attention to examination is appropriate.  Neck:   Supple.  Full range of motion with discomfort.  Respiratory: Patient is breathing without any difficulty.   NEUROLOGICAL:     Awake, alert, oriented to person, place, and time.  Speech is clear and fluent.   Cranial Nerves: Pupils equal round and reactive to light.  Facial tone is symmetric.  Facial sensation is symmetric. Shoulder shrug is symmetric. Tongue protrusion is midline.  There is no pronator drift.  Strength: Side Biceps Triceps Deltoid Interossei Grip Wrist Ext. Wrist Flex.  R 5 5 4+ 5 5 5 5   L 5 5 5 5 5 5 5    Side  Iliopsoas Quads Hamstring PF DF EHL  R 5 5 5 5 5 5   L 5 5 5 5 5 5    Reflexes are 1+ and symmetric at the biceps, triceps, brachioradialis, patella and achilles.   Hoffman's is absent.   Bilateral upper and lower extremity sensation is intact to light touch.    No evidence of dysmetria noted.  Gait is normal.     Medical Decision Making  Imaging: MRI C spine 03/25/2022 IMPRESSION: 1. Mild central canal stenosis and mild foraminal stenosis on the left at C4-5. 2. Right paracentral disc protrusion and spurring at C5-6 with cord flattening on the right.     Electronically Signed   By: Marlan Palau M.D.   On: 03/25/2022 11:43  Flexion-extension x-rays on March 30, 2022 show 3.6  mm of anterolisthesis of C4 on C5 in flexion that shows approximately 1 mm of retrolisthesis on extension.  I have personally reviewed the images and agree with the above interpretation.  Assessment and Plan: Ms. Johansen is a pleasant 72 y.o. 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  She also has significant right shoulder pain due to her orthopedic concerns there.  I am not convinced that she will have good relief from a surgical intervention.  She has not exhausted conservative management.  I recommended that she proceed with physical therapy for her neck before we make any decisions.  I will see her back in approximately 8 weeks.  I spent a total of 30 minutes in this patient's care today. This time was spent reviewing pertinent records including imaging studies, obtaining and confirming history, performing a directed evaluation, formulating and discussing my recommendations, and documenting the visit within the medical record.    Thank you for involving me in the care of this patient.      Breaker Springer K. Myer Haff MD, [facility]

## 2022-09-01 ENCOUNTER — Ambulatory Visit (INDEPENDENT_AMBULATORY_CARE_PROVIDER_SITE_OTHER): Payer: 59 | Admitting: Neurosurgery

## 2022-09-01 ENCOUNTER — Encounter: Payer: Self-pay | Admitting: Neurosurgery

## 2022-09-01 VITALS — BP 124/68 | Ht 62.0 in | Wt 118.0 lb

## 2022-09-01 DIAGNOSIS — M542 Cervicalgia: Secondary | ICD-10-CM

## 2022-09-01 DIAGNOSIS — M532X2 Spinal instabilities, cervical region: Secondary | ICD-10-CM

## 2022-09-01 DIAGNOSIS — M545 Low back pain, unspecified: Secondary | ICD-10-CM

## 2022-09-01 DIAGNOSIS — M5412 Radiculopathy, cervical region: Secondary | ICD-10-CM

## 2022-09-01 DIAGNOSIS — M4312 Spondylolisthesis, cervical region: Secondary | ICD-10-CM | POA: Diagnosis not present

## 2022-09-01 DIAGNOSIS — G8929 Other chronic pain: Secondary | ICD-10-CM | POA: Diagnosis not present

## 2022-09-01 DIAGNOSIS — M25511 Pain in right shoulder: Secondary | ICD-10-CM

## 2022-09-02 ENCOUNTER — Ambulatory Visit: Payer: 59 | Admitting: Student in an Organized Health Care Education/Training Program

## 2022-09-03 ENCOUNTER — Ambulatory Visit
Admission: RE | Admit: 2022-09-03 | Discharge: 2022-09-03 | Disposition: A | Discharge status: Home / Self Care | Payer: 59 | Source: Ambulatory Visit | Attending: Podiatry | Admitting: Podiatry

## 2022-09-03 DIAGNOSIS — S90852A Superficial foreign body, left foot, initial encounter: Secondary | ICD-10-CM

## 2022-09-03 DIAGNOSIS — Z0389 Encounter for observation for other suspected diseases and conditions ruled out: Secondary | ICD-10-CM | POA: Diagnosis not present

## 2022-09-08 ENCOUNTER — Telehealth: Payer: Self-pay | Admitting: *Deleted

## 2022-09-08 NOTE — Telephone Encounter (Signed)
Patient is calling for [place] results from 09/03/22, please advise.

## 2022-09-22 ENCOUNTER — Ambulatory Visit: Payer: 59 | Attending: Neurosurgery | Admitting: Physical Therapy

## 2022-09-22 ENCOUNTER — Encounter: Payer: Self-pay | Admitting: Physical Therapy

## 2022-09-22 DIAGNOSIS — M4312 Spondylolisthesis, cervical region: Secondary | ICD-10-CM | POA: Insufficient documentation

## 2022-09-22 DIAGNOSIS — M6281 Muscle weakness (generalized): Secondary | ICD-10-CM | POA: Diagnosis not present

## 2022-09-22 DIAGNOSIS — G8929 Other chronic pain: Secondary | ICD-10-CM | POA: Insufficient documentation

## 2022-09-22 DIAGNOSIS — M25511 Pain in right shoulder: Secondary | ICD-10-CM | POA: Insufficient documentation

## 2022-09-22 DIAGNOSIS — M542 Cervicalgia: Secondary | ICD-10-CM | POA: Diagnosis not present

## 2022-09-22 DIAGNOSIS — M5412 Radiculopathy, cervical region: Secondary | ICD-10-CM | POA: Insufficient documentation

## 2022-09-22 NOTE — Therapy (Unsigned)
OUTPATIENT PHYSICAL THERAPY NECK EVALUATION   Patient Name: Danielle Stephens MRN: [id number] DOB:1950/12/11, 72 y.o., female Today's Date: 09/22/2022   PT End of Session - 09/22/22 1222     Visit Number 1    Number of Visits 13    Date for PT Re-Evaluation 11/05/22    Authorization Type [employer] Medicare 2024    Progress Note Due on Visit 10    PT Start Time 1030    PT Stop Time 1120    PT Time Calculation (min) 50 min    Activity Tolerance Patient limited by pain    Behavior During Therapy [facility] for tasks assessed/performed             Past Medical History:  Diagnosis Date   Anxiety    Chronic kidney disease    STAGE 3   Hypertension    Shoulder pain    Past Surgical History:  Procedure Laterality Date   CLOSED MANIPULATION SHOULDER WITH STERIOD INJECTION Right 08/28/2019   Procedure: MANIPULATION SHOULDER WITH STEROID INJECTION x2;  Surgeon: Signa Kell, MD;  Location: [facility];  Service: Orthopedics;  Laterality: Right;   COLONOSCOPY     COLONOSCOPY WITH PROPOFOL N/A 09/23/2021   Procedure: COLONOSCOPY WITH PROPOFOL;  Surgeon: Toney Reil, MD;  Location: [facility];  Service: Endoscopy;  Laterality: N/A;  ONE ASCENDING COLON POLYP NOT RETRIEVED   DILATION AND CURETTAGE, DIAGNOSTIC / THERAPEUTIC     POLYPECTOMY N/A 09/23/2021   Procedure: POLYPECTOMY;  Surgeon: Toney Reil, MD;  Location: [facility];  Service: Endoscopy;  Laterality: N/A;   REVERSE SHOULDER ARTHROPLASTY Right 05/20/2020   Procedure: Right reverse shoulder arthroplasty, biceps tenodesis;  Surgeon: Signa Kell, MD;  Location: [facility];  Service: Orthopedics;  Laterality: Right;   rotary cuff Right 03/06/2019   SHOULDER ARTHROSCOPY Right 08/28/2019   Procedure: Right shoulder arthroscopic capsular release, lysis of adhesion,;  Surgeon: Signa Kell, MD;  Location: [facility];  Service: Orthopedics;  Laterality: Right;   Patient Active Problem List   Diagnosis Date Noted   Left foot  pain 08/07/2022   Tenderness of chest wall 06/23/2022   Cervical radicular pain 05/12/2022   Chronic left shoulder pain 12/23/2021   Chronic venous insufficiency 10/07/2021   Family history of colon cancer in mother    Polyp of ascending colon    Pain due to varicose veins of lower extremity 09/01/2021   Chronic pain syndrome 07/29/2021   Suprascapular entrapment neuropathy of right side 07/29/2021   Insomnia 07/23/2021   Raynaud's disease without gangrene 05/06/2021   Type B blood, Rh positive 05/04/2021   PAC (premature atrial contraction) 04/14/2021   Chronic right shoulder pain 12/23/2020   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/2020   Right rotator cuff tear arthropathy 12/23/2020   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   Osteopenia 02/11/2020   Vitamin D deficiency 08/18/2019   Hyperlipidemia, mixed 08/17/2019   H/O repair of right rotator cuff 04/10/2019   Benign essential HTN 03/19/2019    PCP: Marjie Skiff, NP  REFERRING PROVIDER: Venetia Night, MD  REFERRING DIAGNOSIS:  M54.2 (ICD-10-CM) - Cervicalgia  M43.12 (ICD-10-CM) - Spondylolisthesis of cervical region  M54.12 (ICD-10-CM) - Cervical radiculopathy    THERAPY DIAG: Cervicalgia  Chronic right shoulder pain  Muscle weakness (generalized)  RATIONALE FOR EVALUATION AND TREATMENT: Rehabilitation  ONSET DATE: March 2024  FOLLOW UP APPT WITH PROVIDER: Yes , 10/27/22   SUBJECTIVE:  Chief Complaint: Pt is a 72 year old female referred to physical therapy for neck pain. Per referring provider, pt has chronic right C6 radiculopathy, and anterolisthesis of C4 on C5 that meets the criteria for instability. She has significant pain with movement of her right arm around her shoulder. She has a small amount of tingling  in her right hand.   Pertinent History Pt is a 72 year old female referred to physical therapy for neck pain. Per referring provider, pt has chronic right C6 radiculopathy, and anterolisthesis of C4 on C5 that meets the criteria for instability. She has significant pain with movement of her right arm around her shoulder. Pt has Hx of R RTC repair (4 years ago with Hx of frozen shoulder); pt ended up having rTSA for R shoulder due to ongoing pain/disability - pt has gone to pain management for her R shoulder for 2-3 years. She reports pain along R arm down to her R forearm; she states it did go to her fingers, but that stopped after ESI. She has a small amount of tingling in her right hand. She states that pain in her neck started in March and has gotten worse. Pt reports pain along SCM that radiates into her R ear. Pt reports she is active and still does daily housework, walking, cleaning. Pt reports using Voltaren gel and Tylenol today and she feels this helped some. Pt reports using muscle relaxers and Gabapentin. Pt reports difficulty sleeping and being prescribed trazodone for sleep aid. Pt is substantially limited with reaching with her R arm. Difficulty with sleeping due to disturbance; pt reports having to toss and turn often; pain with R sidelying. Pt will be out of town 8/20-9/03. No diploplia, no dysphagia, dysarthria. No vertigo.    Pain:  Pain Intensity: Present: 10/10, Best: 5/10, Worst: 10/10 Pain location: R splenius cervicis and into levator region, radiates down to supraclavicular region, some pain into R periscapular region  Pain Quality: sharp  Radiating: Yes , into R arm  Numbness/Tingling: Yes, pt reports NT into neck and shoulder Focal Weakness: Hx of difficulty with gripping/holding items; epidural did help with this  Aggravating factors: cooking/cleaning/R upper limb activity, prolonged sitting, elevating R shoulder, looking up/down  Relieving factors: Rx medication (helps  moderately), Voltaren gel 24-hour pain behavior: continuous pain regardless of time of day  History of prior neck injury, pain, surgery, or therapy: Yes; R RTA, hx ESI of C-spine Falls: Has patient fallen in last 6 months? No, Number of falls: N/A Follow-up appointment with MD: Yes f/u    10/27/22 Dominant hand: right Imaging: Yes ;   MRI C spine 03/25/2022 IMPRESSION: 1. Mild central canal stenosis and mild foraminal stenosis on the left at C4-5. 2. Right paracentral disc protrusion and spurring at C5-6 with cord flattening on the right.     Electronically Signed   By: Marlan Palau M.D.   On: 03/25/2022 11:43   Flexion-extension x-rays on March 30, 2022 show 3.6 mm of anterolisthesis of C4 on C5 in flexion that shows approximately 1 mm of retrolisthesis on extension.  -Per Dr. Myer Haff, anterolisthesis of C4 on C5 that meets the criteria for instability.   Prior level of function: Independent Occupational demands: [employer] Hobbies: crocheting  Red flags (personal history of cancer, h/o spinal tumors, history of compression fracture, chills/fever, night sweats, nausea, vomiting, unrelenting pain): Negative  Intermittent night sweats   Precautions: None  Weight Bearing Restrictions: No  Living Environment Lives with: lives with their spouse and lives with  their son Lives in: House/apartment   Patient Goals: Pain relief; more use of R arm   OBJECTIVE:   Patient Surveys  FOTO 35, predicted outcome score of 24  Cognition Patient is oriented to person, place, and time.  Recent memory is intact.  Remote memory is intact.  Attention span and concentration are intact.  Expressive speech is intact.  Patient's fund of knowledge is within normal limits for educational level.    Gross Musculoskeletal Assessment Tremor: None Bulk: Normal Tone: Normal  Gait Unremarkable  Posture Forward head, mild rounded shoulders   AROM AROM (Normal range in degrees)  AROM 09/22/2022  Cervical  Flexion (50) 32  Extension (80) 44  Right lateral flexion (45) 48*  Left lateral flexion (45) 38*  Right rotation (85) 62  Left rotation (85) 49  (* = pain; Blank rows = not tested)  R shoulder forward elevation AROM to 85 deg only, L shoulder AROM grossly WNL   MMT MMT (out of 5) Right 09/22/2022 Left 09/22/2022  Cervical (isometric)  Flexion 4/5*  Extension 3+/5*  Lateral Flexion 3+/5* (R shoulder pain) WNL  Rotation WNL WNL      Shoulder   Flexion 2-/5  4/5*  Extension    Abduction 2-/5  3+/5*  Internal rotation    Horizontal abduction    External rotation  3+/5* 4-/5*  Lower Trapezius    Rhomboids        Elbow  Flexion 4+ 4+  Extension 4+ 4+  Pronation    Supination        (* = pain; Blank rows = not tested)   Sensation Grossly intact to light touch bilateral UE as determined by testing dermatomes C2-T2. Proprioception and hot/cold testing deferred on this date.  Reflexes R/L Elbow: 2+/2+  Brachioradialis: 2+/2+  Tricep: 1+/1+ Hoffman's: Negative Clonus: negative (1-2 beats per side)   Palpation Location LEFT  RIGHT           Suboccipitals    Cervical paraspinals 1 2  Upper Trapezius 0 2  Levator Scapulae 0 1  Rhomboid Major/Minor 0 1  (Blank rows = not tested) Graded on 0-4 scale (0 = no pain, 1 = pain, 2 = pain with wincing/grimacing/flinching, 3 = pain with withdrawal, 4 = unwilling to allow palpation), (Blank rows = not tested)  Repeated Movements Repeated cervical retraction in hooklying: increase, worse (increase in R upper trap/paracervical pain)  Passive Accessory Intervertebral Motion Deferred today     SPECIAL TESTS Spurlings A (ipsilateral lateral flexion/axial compression): R: Positive L: Negative Distraction Test: Positive for pain relief   Hoffman Sign (cervical cord compression): R: Negative L: Negative ULTT Median: R: Not examined L: Not examined ULTT Ulnar: R: Not examined L: Not examined ULTT  Radial: R: Not examined L: Not examined      TODAY'S TREATMENT    Therapeutic Exercise - for HEP establishment, discussion on appropriate exercise/activity modification, PT education   Reviewed baseline home exercises and provided handout for [employer] program (see Access Code); tactile cueing and therapist demonstration utilized as needed for carryover of proper technique to HEP.    Patient education on current condition, anatomy involved, prognosis, plan of care. Discussion on activity modification to prevent flare-up of condition, including avoidance of prolonged slouching/stooping/cervical flexion.      PATIENT EDUCATION:  Education details: Plan of care Person educated: Patient Education method: Explanation Education comprehension: verbalized understanding   HOME EXERCISE PROGRAM: Access Code: [email or web address] URL: [email or web address] Date: 09/22/2022 Prepared by: Consuela Mimes  Exercises - Standing Isometric Cervical Extension with Manual Resistance  - 2 x daily - 7 x weekly - 2 sets - 10 reps - Standing Isometric Cervical Flexion with Manual Resistance  - 2 x daily - 7 x weekly - 2 sets - 10 reps - Standing Isometric Cervical Sidebending with Manual Resistance  - 2 x daily - 7 x weekly - 2 sets - 10 reps - 5sec hold - Seated Self Cervical Traction  - 2 x daily - 7 x weekly - 5-10sec hold   ASSESSMENT:  CLINICAL IMPRESSION: Patient is a 72 y.o. female who was seen today for physical therapy evaluation and treatment for neck pain; referred from Dr. Myer Haff. Patient has complicated orthopedic history - Hx of failed RCR of R shoulder and pt eventually undergoing rTSA. Pt has chronic R shoulder and arm pain following this and has been following up with pain management. Pt has MR findings of mild stenosis at C4-5 and right paracentral disc protrusion and spurring at C5-6; evidence of instability/anterolisthesis at C4-5 with flexion per flexion/extension  radiographs. We completed limited repeated movement testing due to instability and initiated her HEP with isomerics and self-traction for pain control. Objective impairments include decreased ROM, decreased strength, hypomobility, impaired flexibility, impaired UE functional use, postural dysfunction, and pain. These impairments are limiting patient from meal prep, cleaning, laundry, driving, community activity, and occupation. Personal factors including Age, Past/current experiences, Time since onset of injury/illness/exacerbation, and 3+ comorbidities: (anxiety, chronic kidney disease, HTN, Hx of multiple orthopedic procedures R shoulder)  are also affecting patient's functional outcome. Patient will benefit from skilled PT to address above impairments and improve overall function.  REHAB POTENTIAL: Fair given evidence of cervical spine instability, complicated orthopedic Hx of R shoulder  CLINICAL DECISION MAKING: Unstable/unpredictable  EVALUATION COMPLEXITY: High   GOALS: Goals reviewed with patient? Yes  SHORT TERM GOALS: Target date: 10/13/2022  Pt will be independent with HEP to improve strength and decrease neck pain to improve pain-free function at home and work. Baseline: 09/22/22: Baseline HEP initiated  Goal status: INITIAL   LONG TERM GOALS: Target date: 11/03/2022  Pt will increase FOTO to at least 55 to demonstrate significant improvement in function at home and work related to neck pain  Baseline: 09/22/22: 35 Goal status: INITIAL  2.  Pt will decrease worst neck pain by at least 2 points on the NPRS in order to demonstrate clinically significant reduction in neck pain. Baseline: 09/22/22: Pain 10/10 at worst  Goal status: INITIAL  3.  Pt will have cervical spine flexion/extension WFL and rotation to 60 deg or greater for both directions without reproduction of symptoms as needed for scanning environment and completing driving and overhead activity Baseline: 09/22/22: Motion loss  with flexion, extension, L rotation  Goal status: INITIAL  4.  Pt will tolerate forward elevation of R shoulder up to 120 deg without reproduction of R upper quarter pain as needed for reaching, self-care tasks, household chores Baseline: 09/22/22: R shoulder flexion AROM up to 85 deg, pain in upper trap/upper arm.  Goal status: INITIAL   PLAN: PT FREQUENCY: 2x/week  PT DURATION: 6 weeks  PLANNED INTERVENTIONS: Therapeutic exercises, Therapeutic activity, Neuromuscular re-education, Balance training, Gait training, Patient/Family education, Joint manipulation, Joint mobilization, Dry Needling, Electrical stimulation, Spinal mobilization, Cryotherapy, Moist heat, Taping, Traction, and Manual therapy  PLAN FOR NEXT SESSION: Manual traction and STM for symptom modulation. Continue with isometric and re-training for deep neck flexors/stabilizers. MET to improve ROM. Consider use of  e-stim and DN for symptom modulation prn. Progress with postural work prn.    Consuela Mimes, PT, DPT #[id number]  Gertie Exon 09/22/2022, 12:23 PM

## 2022-09-24 ENCOUNTER — Encounter: Payer: Self-pay | Admitting: Physical Therapy

## 2022-09-24 ENCOUNTER — Ambulatory Visit: Payer: 59 | Admitting: Physical Therapy

## 2022-09-24 DIAGNOSIS — M6281 Muscle weakness (generalized): Secondary | ICD-10-CM | POA: Diagnosis not present

## 2022-09-24 DIAGNOSIS — G8929 Other chronic pain: Secondary | ICD-10-CM | POA: Diagnosis not present

## 2022-09-24 DIAGNOSIS — M542 Cervicalgia: Secondary | ICD-10-CM | POA: Diagnosis not present

## 2022-09-24 DIAGNOSIS — M25511 Pain in right shoulder: Secondary | ICD-10-CM | POA: Diagnosis not present

## 2022-09-24 DIAGNOSIS — M4312 Spondylolisthesis, cervical region: Secondary | ICD-10-CM | POA: Diagnosis not present

## 2022-09-24 DIAGNOSIS — M5412 Radiculopathy, cervical region: Secondary | ICD-10-CM | POA: Diagnosis not present

## 2022-09-24 NOTE — Therapy (Addendum)
OUTPATIENT PHYSICAL THERAPY TREATMENT   Patient Name: Danielle Stephens MRN: [id number] DOB:Oct 27, 1950, 72 y.o., female Today's Date: 09/24/2022   PT End of Session - 09/24/22 1034     Visit Number 2    Number of Visits 13    Date for PT Re-Evaluation 11/05/22    Authorization Type [employer] 2024    Progress Note Due on Visit 10    PT Start Time 1034    PT Stop Time 1115    PT Time Calculation (min) 41 min    Activity Tolerance Patient limited by pain    Behavior During Therapy [facility] for tasks assessed/performed              Past Medical History:  Diagnosis Date   Anxiety    Chronic kidney disease    STAGE 3   Hypertension    Shoulder pain    Past Surgical History:  Procedure Laterality Date   CLOSED MANIPULATION SHOULDER WITH STERIOD INJECTION Right 08/28/2019   Procedure: MANIPULATION SHOULDER WITH STEROID INJECTION x2;  Surgeon: Signa Kell, MD;  Location: [facility];  Service: Orthopedics;  Laterality: Right;   COLONOSCOPY     COLONOSCOPY WITH PROPOFOL N/A 09/23/2021   Procedure: COLONOSCOPY WITH PROPOFOL;  Surgeon: Toney Reil, MD;  Location: [facility];  Service: Endoscopy;  Laterality: N/A;  ONE ASCENDING COLON POLYP NOT RETRIEVED   DILATION AND CURETTAGE, DIAGNOSTIC / THERAPEUTIC     POLYPECTOMY N/A 09/23/2021   Procedure: POLYPECTOMY;  Surgeon: Toney Reil, MD;  Location: [facility];  Service: Endoscopy;  Laterality: N/A;   REVERSE SHOULDER ARTHROPLASTY Right 05/20/2020   Procedure: Right reverse shoulder arthroplasty, biceps tenodesis;  Surgeon: Signa Kell, MD;  Location: [facility];  Service: Orthopedics;  Laterality: Right;   rotary cuff Right 03/06/2019   SHOULDER ARTHROSCOPY Right 08/28/2019   Procedure: Right shoulder arthroscopic capsular release, lysis of adhesion,;  Surgeon: Signa Kell, MD;  Location: [facility];  Service: Orthopedics;  Laterality: Right;   Patient Active Problem List   Diagnosis Date Noted   Left foot pain  08/07/2022   Tenderness of chest wall 06/23/2022   Cervical radicular pain 05/12/2022   Chronic left shoulder pain 12/23/2021   Chronic venous insufficiency 10/07/2021   Family history of colon cancer in mother    Polyp of ascending colon    Pain due to varicose veins of lower extremity 09/01/2021   Chronic pain syndrome 07/29/2021   Suprascapular entrapment neuropathy of right side 07/29/2021   Insomnia 07/23/2021   Raynaud's disease without gangrene 05/06/2021   Type B blood, Rh positive 05/04/2021   PAC (premature atrial contraction) 04/14/2021   Chronic right shoulder pain 12/23/2020   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/2020   Right rotator cuff tear arthropathy 12/23/2020   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   Osteopenia 02/11/2020   Vitamin D deficiency 08/18/2019   Hyperlipidemia, mixed 08/17/2019   H/O repair of right rotator cuff 04/10/2019   Benign essential HTN 03/19/2019    PCP: Marjie Skiff, NP  REFERRING PROVIDER: Venetia Night, MD  REFERRING DIAGNOSIS:  M54.2 (ICD-10-CM) - Cervicalgia  M43.12 (ICD-10-CM) - Spondylolisthesis of cervical region  M54.12 (ICD-10-CM) - Cervical radiculopathy    THERAPY DIAG: Cervicalgia  Chronic right shoulder pain  Muscle weakness (generalized)  RATIONALE FOR EVALUATION AND TREATMENT: Rehabilitation  ONSET DATE: March 2024  FOLLOW UP APPT WITH PROVIDER: Yes , 10/27/22  Pertinent History Pt is a 72 year old female referred to physical therapy  for neck pain. Per referring provider, pt has chronic right C6 radiculopathy, and anterolisthesis of C4 on C5 that meets the criteria for instability. She has significant pain with movement of her right arm around her shoulder. Pt has Hx of R RTC repair (4 years ago with Hx of frozen shoulder); pt ended up having rTSA for R shoulder due to ongoing pain/disability - pt has gone to pain management for her R shoulder for 2-3 years. She reports pain along R arm down  to her R forearm; she states it did go to her fingers, but that stopped after ESI. She has a small amount of tingling in her right hand. She states that pain in her neck started in March and has gotten worse. Pt reports pain along SCM that radiates into her R ear. Pt reports she is active and still does daily housework, walking, cleaning. Pt reports using Voltaren gel and Tylenol today and she feels this helped some. Pt reports using muscle relaxers and Gabapentin. Pt reports difficulty sleeping and being prescribed trazodone for sleep aid. Pt is substantially limited with reaching with her R arm. Difficulty with sleeping due to disturbance; pt reports having to toss and turn often; pain with R sidelying. Pt will be out of town 8/20-9/03. No diploplia, no dysphagia, dysarthria. No vertigo.    Pain:  Pain Intensity: Present: 10/10, Best: 5/10, Worst: 10/10 Pain location: R splenius cervicis and into levator region, radiates down to supraclavicular region, some pain into R periscapular region  Pain Quality: sharp  Radiating: Yes , into R arm  Numbness/Tingling: Yes, pt reports NT into neck and shoulder Focal Weakness: Hx of difficulty with gripping/holding items; epidural did help with this  Aggravating factors: cooking/cleaning/R upper limb activity, prolonged sitting, elevating R shoulder, looking up/down  Relieving factors: Rx medication (helps moderately), Voltaren gel 24-hour pain behavior: continuous pain regardless of time of day  History of prior neck injury, pain, surgery, or therapy: Yes; R RTA, hx ESI of C-spine Falls: Has patient fallen in last 6 months? No, Number of falls: N/A Follow-up appointment with MD: Yes f/u    10/27/22 Dominant hand: right Imaging: Yes ;   MRI C spine 03/25/2022 IMPRESSION: 1. Mild central canal stenosis and mild foraminal stenosis on the left at C4-5. 2. Right paracentral disc protrusion and spurring at C5-6 with cord flattening on the right.      Electronically Signed   By: Marlan Palau M.D.   On: 03/25/2022 11:43   Flexion-extension x-rays on March 30, 2022 show 3.6 mm of anterolisthesis of C4 on C5 in flexion that shows approximately 1 mm of retrolisthesis on extension.  -Per Dr. Myer Haff, anterolisthesis of C4 on C5 that meets the criteria for instability.   Prior level of function: Independent Occupational demands: [employer] Hobbies: crocheting  Red flags (personal history of cancer, h/o spinal tumors, history of compression fracture, chills/fever, night sweats, nausea, vomiting, unrelenting pain): Negative  Intermittent night sweats  Patient Goals: Pain relief; more use of R arm    OBJECTIVE:    Posture Forward head, mild rounded shoulders   AROM AROM (Normal range in degrees) AROM 09/24/2022  Cervical  Flexion (50) 32  Extension (80) 44  Right lateral flexion (45) 48*  Left lateral flexion (45) 38*  Right rotation (85) 62  Left rotation (85) 49  (* = pain; Blank rows = not tested)  R shoulder forward elevation AROM to 85 deg only, L shoulder AROM grossly WNL   MMT MMT (  out of 5) Right 09/24/2022 Left 09/24/2022  Cervical (isometric)  Flexion 4/5*  Extension 3+/5*  Lateral Flexion 3+/5* (R shoulder pain) WNL  Rotation WNL WNL      Shoulder   Flexion 2-/5  4/5*  Extension    Abduction 2-/5  3+/5*  Internal rotation    Horizontal abduction    External rotation  3+/5* 4-/5*  Lower Trapezius    Rhomboids        Elbow  Flexion 4+ 4+  Extension 4+ 4+  Pronation    Supination        (* = pain; Blank rows = not tested)    Palpation Location LEFT  RIGHT           Suboccipitals    Cervical paraspinals 1 2  Upper Trapezius 0 2  Levator Scapulae 0 1  Rhomboid Major/Minor 0 1  (Blank rows = not tested) Graded on 0-4 scale (0 = no pain, 1 = pain, 2 = pain with wincing/grimacing/flinching, 3 = pain with withdrawal, 4 = unwilling to allow palpation), (Blank rows = not tested)  Repeated  Movements Repeated cervical retraction in hooklying: increase, worse (increase in R upper trap/paracervical pain)  Passive Accessory Intervertebral Motion Deferred today     SPECIAL TESTS Spurlings A (ipsilateral lateral flexion/axial compression): R: Positive L: Negative Distraction Test: Positive for pain relief   Hoffman Sign (cervical cord compression): R: Negative L: Negative ULTT Median: R: Not examined L: Not examined ULTT Ulnar: R: Not examined L: Not examined ULTT Radial: R: Not examined L: Not examined     TODAY'S TREATMENT   SUBJECTIVE STATEMENT: Pt reports extreme pain affecting R upper quarter along R cervical paraspinal region and down to R upper trap and R arm down to R wrist; she reports feeling this more Tuesday afternoon and continuing yesterday. Patient reports using Gabapentin this AM for pain control. Patient reports tolerating isometrics fairly well. Pt reports tolerating self-traction relatively well.     Manual Therapy - for symptom modulation, soft tissue sensitivity and mobility, joint mobility, ROM   Manual cervical traction, general; 10 sec on, 5 sec off; x 10 minutes STM/DTM R>L upper trapezius and C3-6 splenius cervicis/capitis x 12 minutes  Passive median nerve gliding with scapular depression; R upper limb with [id number] position; x20   Therapeutic Exercise - for improved soft tissue flexibility and extensibility as needed for ROM, improved deep neck flexor strength as needed for stabilization of cervical column, isometrics for promoting stability and analgesic effect  Deep cervical flexor nod, suipne; 1 x 10, 5 sec hold  -PT demo and verbal cueing for technique Cervical spine isometrics, flexion/extension and lateral flexion R/L; reviewed with brief performance of flexion and lateral flexion isometrics for example Self cervical traction; technique reviewed  PATIENT EDUCATION: HEP updated and reviewed. We discussed role of provocative testing in  initial exam and expectation for no significant provocative maneuvers with intervention moving forward.      PATIENT EDUCATION:  Education details: see above for patient education details Person educated: Patient Education method: Explanation Education comprehension: verbalized understanding   HOME EXERCISE PROGRAM: Access Code: [email or web address] URL: [email or web address] Date: 09/24/2022 Prepared by: Consuela Mimes  Exercises - Standing Isometric Cervical Extension with Manual Resistance  - 2 x daily - 7 x weekly - 2 sets - 10 reps - Standing Isometric Cervical Flexion with Manual Resistance  - 2 x daily - 7 x weekly - 2 sets - 10 reps - Standing Isometric Cervical Sidebending with Manual  Resistance  - 2 x daily - 7 x weekly - 2 sets - 10 reps - 5sec hold - Seated Self Cervical Traction  - 2 x daily - 7 x weekly - 5-10sec hold - Supine Deep Neck Flexor Nods  - 2 x daily - 7 x weekly - 2 sets - 10 reps - 5sec hold    ASSESSMENT:  CLINICAL IMPRESSION: Patient participates very well with PT, but she does report severe pain over last 2 days. She feels she tolerated her HEP well. Pt did have pain provocation with manual muscle testing/myotome screening and with special tests for C-spine. Given notable irritability of patient's symptoms, this led to notable increase in pain rating at pt's next follow-up. We focused primarily on symptom modulation today, and pt reports feeling significantly better post-treatment following manual traction/MT. Patient has remaining deficits in cervical spine and R shoulder ROM, R paracervical mm and UT/periscapular pain, decreased C-spine mobility. Patient will benefit from continued skilled therapeutic intervention to address the above deficits as needed for improved function and QoL.    REHAB POTENTIAL: Fair given evidence of cervical spine instability, complicated orthopedic Hx of R shoulder  CLINICAL DECISION MAKING:  Unstable/unpredictable  EVALUATION COMPLEXITY: High   GOALS: Goals reviewed with patient? Yes  SHORT TERM GOALS: Target date: 10/15/2022  Pt will be independent with HEP to improve strength and decrease neck pain to improve pain-free function at home and work. Baseline: 09/22/22: Baseline HEP initiated  Goal status: INITIAL   LONG TERM GOALS: Target date: 11/05/2022  Pt will increase FOTO to at least 55 to demonstrate significant improvement in function at home and work related to neck pain  Baseline: 09/22/22: 35 Goal status: INITIAL  2.  Pt will decrease worst neck pain by at least 2 points on the NPRS in order to demonstrate clinically significant reduction in neck pain. Baseline: 09/22/22: Pain 10/10 at worst  Goal status: INITIAL  3.  Pt will have cervical spine flexion/extension WFL and rotation to 60 deg or greater for both directions without reproduction of symptoms as needed for scanning environment and completing driving and overhead activity Baseline: 09/22/22: Motion loss with flexion, extension, L rotation  Goal status: INITIAL  4.  Pt will tolerate forward elevation of R shoulder up to 120 deg without reproduction of R upper quarter pain as needed for reaching, self-care tasks, household chores Baseline: 09/22/22: R shoulder flexion AROM up to 85 deg, pain in upper trap/upper arm.  Goal status: INITIAL   PLAN: PT FREQUENCY: 2x/week  PT DURATION: 6 weeks  PLANNED INTERVENTIONS: Therapeutic exercises, Therapeutic activity, Neuromuscular re-education, Balance training, Gait training, Patient/Family education, Joint manipulation, Joint mobilization, Dry Needling, Electrical stimulation, Spinal mobilization, Cryotherapy, Moist heat, Taping, Traction, and Manual therapy  PLAN FOR NEXT SESSION: Manual traction and STM for symptom modulation. Continue with isometric and re-training for deep neck flexors/stabilizers. MET to improve ROM. Consider use of e-stim and DN for symptom  modulation prn. Progress with postural work prn.    Consuela Mimes, PT, DPT #[id number]  Gertie Exon 09/24/2022, 12:54 PM

## 2022-09-24 NOTE — Addendum Note (Signed)
Addended by: Gertie Exon on: 09/24/2022 08:18 AM   Modules accepted: Orders

## 2022-09-29 ENCOUNTER — Ambulatory Visit: Payer: 59 | Admitting: Physical Therapy

## 2022-09-29 DIAGNOSIS — M542 Cervicalgia: Secondary | ICD-10-CM

## 2022-09-29 DIAGNOSIS — M6281 Muscle weakness (generalized): Secondary | ICD-10-CM | POA: Diagnosis not present

## 2022-09-29 DIAGNOSIS — G8929 Other chronic pain: Secondary | ICD-10-CM

## 2022-09-29 DIAGNOSIS — M5412 Radiculopathy, cervical region: Secondary | ICD-10-CM | POA: Diagnosis not present

## 2022-09-29 DIAGNOSIS — M4312 Spondylolisthesis, cervical region: Secondary | ICD-10-CM | POA: Diagnosis not present

## 2022-09-29 DIAGNOSIS — M25511 Pain in right shoulder: Secondary | ICD-10-CM | POA: Diagnosis not present

## 2022-09-29 NOTE — Therapy (Signed)
OUTPATIENT PHYSICAL THERAPY TREATMENT   Patient Name: Danielle Stephens MRN: [id number] DOB:02/07/1951, 72 y.o., female Today's Date: 09/29/2022   PT End of Session - 09/29/22 1034     Visit Number 3    Number of Visits 13    Date for PT Re-Evaluation 11/05/22    Authorization Type [employer] Medicare 2024    Progress Note Due on Visit 10    PT Start Time 1034    PT Stop Time 1114    PT Time Calculation (min) 40 min    Activity Tolerance Patient limited by pain    Behavior During Therapy [facility] for tasks assessed/performed              Past Medical History:  Diagnosis Date   Anxiety    Chronic kidney disease    STAGE 3   Hypertension    Shoulder pain    Past Surgical History:  Procedure Laterality Date   CLOSED MANIPULATION SHOULDER WITH STERIOD INJECTION Right 08/28/2019   Procedure: MANIPULATION SHOULDER WITH STEROID INJECTION x2;  Surgeon: Signa Kell, MD;  Location: [facility];  Service: Orthopedics;  Laterality: Right;   COLONOSCOPY     COLONOSCOPY WITH PROPOFOL N/A 09/23/2021   Procedure: COLONOSCOPY WITH PROPOFOL;  Surgeon: Toney Reil, MD;  Location: [facility];  Service: Endoscopy;  Laterality: N/A;  ONE ASCENDING COLON POLYP NOT RETRIEVED   DILATION AND CURETTAGE, DIAGNOSTIC / THERAPEUTIC     POLYPECTOMY N/A 09/23/2021   Procedure: POLYPECTOMY;  Surgeon: Toney Reil, MD;  Location: [facility];  Service: Endoscopy;  Laterality: N/A;   REVERSE SHOULDER ARTHROPLASTY Right 05/20/2020   Procedure: Right reverse shoulder arthroplasty, biceps tenodesis;  Surgeon: Signa Kell, MD;  Location: [facility];  Service: Orthopedics;  Laterality: Right;   rotary cuff Right 03/06/2019   SHOULDER ARTHROSCOPY Right 08/28/2019   Procedure: Right shoulder arthroscopic capsular release, lysis of adhesion,;  Surgeon: Signa Kell, MD;  Location: [facility];  Service: Orthopedics;  Laterality: Right;   Patient Active Problem List   Diagnosis Date Noted   Left foot pain  08/07/2022   Tenderness of chest wall 06/23/2022   Cervical radicular pain 05/12/2022   Chronic left shoulder pain 12/23/2021   Chronic venous insufficiency 10/07/2021   Family history of colon cancer in mother    Polyp of ascending colon    Pain due to varicose veins of lower extremity 09/01/2021   Chronic pain syndrome 07/29/2021   Suprascapular entrapment neuropathy of right side 07/29/2021   Insomnia 07/23/2021   Raynaud's disease without gangrene 05/06/2021   Type B blood, Rh positive 05/04/2021   PAC (premature atrial contraction) 04/14/2021   Chronic right shoulder pain 12/23/2020   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/2020   Right rotator cuff tear arthropathy 12/23/2020   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   Osteopenia 02/11/2020   Vitamin D deficiency 08/18/2019   Hyperlipidemia, mixed 08/17/2019   H/O repair of right rotator cuff 04/10/2019   Benign essential HTN 03/19/2019    PCP: Marjie Skiff, NP  REFERRING PROVIDER: Venetia Night, MD  REFERRING DIAGNOSIS:  M54.2 (ICD-10-CM) - Cervicalgia  M43.12 (ICD-10-CM) - Spondylolisthesis of cervical region  M54.12 (ICD-10-CM) - Cervical radiculopathy    THERAPY DIAG: No diagnosis found.  RATIONALE FOR EVALUATION AND TREATMENT: Rehabilitation  ONSET DATE: March 2024  FOLLOW UP APPT WITH PROVIDER: Yes , 10/27/22  Pertinent History Pt is a 72 year old female referred to physical therapy for neck pain. Per referring provider, pt  has chronic right C6 radiculopathy, and anterolisthesis of C4 on C5 that meets the criteria for instability. She has significant pain with movement of her right arm around her shoulder. Pt has Hx of R RTC repair (4 years ago with Hx of frozen shoulder); pt ended up having rTSA for R shoulder due to ongoing pain/disability - pt has gone to pain management for her R shoulder for 2-3 years. She reports pain along R arm down to her R forearm; she states it did go to her fingers,  but that stopped after ESI. She has a small amount of tingling in her right hand. She states that pain in her neck started in March and has gotten worse. Pt reports pain along SCM that radiates into her R ear. Pt reports she is active and still does daily housework, walking, cleaning. Pt reports using Voltaren gel and Tylenol today and she feels this helped some. Pt reports using muscle relaxers and Gabapentin. Pt reports difficulty sleeping and being prescribed trazodone for sleep aid. Pt is substantially limited with reaching with her R arm. Difficulty with sleeping due to disturbance; pt reports having to toss and turn often; pain with R sidelying. Pt will be out of town 8/20-9/03. No diploplia, no dysphagia, dysarthria. No vertigo.    Pain:  Pain Intensity: Present: 10/10, Best: 5/10, Worst: 10/10 Pain location: R splenius cervicis and into levator region, radiates down to supraclavicular region, some pain into R periscapular region  Pain Quality: sharp  Radiating: Yes , into R arm  Numbness/Tingling: Yes, pt reports NT into neck and shoulder Focal Weakness: Hx of difficulty with gripping/holding items; epidural did help with this  Aggravating factors: cooking/cleaning/R upper limb activity, prolonged sitting, elevating R shoulder, looking up/down  Relieving factors: Rx medication (helps moderately), Voltaren gel 24-hour pain behavior: continuous pain regardless of time of day  History of prior neck injury, pain, surgery, or therapy: Yes; R RTA, hx ESI of C-spine Falls: Has patient fallen in last 6 months? No, Number of falls: N/A Follow-up appointment with MD: Yes f/u    10/27/22 Dominant hand: right Imaging: Yes ;   MRI C spine 03/25/2022 IMPRESSION: 1. Mild central canal stenosis and mild foraminal stenosis on the left at C4-5. 2. Right paracentral disc protrusion and spurring at C5-6 with cord flattening on the right.     Electronically Signed   By: Marlan Palau M.D.   On:  03/25/2022 11:43   Flexion-extension x-rays on March 30, 2022 show 3.6 mm of anterolisthesis of C4 on C5 in flexion that shows approximately 1 mm of retrolisthesis on extension.  -Per Dr. Myer Haff, anterolisthesis of C4 on C5 that meets the criteria for instability.   Prior level of function: Independent Occupational demands: [employer] Hobbies: crocheting  Red flags (personal history of cancer, h/o spinal tumors, history of compression fracture, chills/fever, night sweats, nausea, vomiting, unrelenting pain): Negative  Intermittent night sweats  Patient Goals: Pain relief; more use of R arm    OBJECTIVE:    Posture Forward head, mild rounded shoulders   AROM AROM (Normal range in degrees) AROM 09/22/2022  Cervical  Flexion (50) 32  Extension (80) 44  Right lateral flexion (45) 48*  Left lateral flexion (45) 38*  Right rotation (85) 62  Left rotation (85) 49  (* = pain; Blank rows = not tested)  R shoulder forward elevation AROM to 85 deg only, L shoulder AROM grossly WNL   MMT MMT (out of 5) Right 09/22/2022 Left 09/22/2022  Cervical (isometric)  Flexion 4/5*  Extension 3+/5*  Lateral Flexion 3+/5* (R shoulder pain) WNL  Rotation WNL WNL      Shoulder   Flexion 2-/5  4/5*  Extension    Abduction 2-/5  3+/5*  Internal rotation    Horizontal abduction    External rotation  3+/5* 4-/5*  Lower Trapezius    Rhomboids        Elbow  Flexion 4+ 4+  Extension 4+ 4+  Pronation    Supination        (* = pain; Blank rows = not tested)    Palpation Location LEFT  RIGHT           Suboccipitals    Cervical paraspinals 1 2  Upper Trapezius 0 2  Levator Scapulae 0 1  Rhomboid Major/Minor 0 1  (Blank rows = not tested) Graded on 0-4 scale (0 = no pain, 1 = pain, 2 = pain with wincing/grimacing/flinching, 3 = pain with withdrawal, 4 = unwilling to allow palpation), (Blank rows = not tested)  Repeated Movements Repeated cervical retraction in hooklying:  increase, worse (increase in R upper trap/paracervical pain)  Passive Accessory Intervertebral Motion Deferred today     SPECIAL TESTS Spurlings A (ipsilateral lateral flexion/axial compression): R: Positive L: Negative Distraction Test: Positive for pain relief   Hoffman Sign (cervical cord compression): R: Negative L: Negative ULTT Median: R: Not examined L: Not examined ULTT Ulnar: R: Not examined L: Not examined ULTT Radial: R: Not examined L: Not examined     TODAY'S TREATMENT   SUBJECTIVE STATEMENT: Pt reports 5/10 pain at arrival, along R UT and R paracervical region. Pt reports some pain affecting L arm as well. She reports 2 days of improved sleep after last visit - though her condition did regress back to her typical pain.     Manual Therapy - for symptom modulation, soft tissue sensitivity and mobility, joint mobility, ROM   Manual cervical traction, general; 10 sec on, 5 sec off; x 10 minutes STM/DTM R>L upper trapezius and C3-6 splenius cervicis/capitis x 10 minutes  Passive median nerve gliding with scapular depression; R upper limb with [id number] position; x20    Trigger Point Dry Needling (TDN), unbilled Education performed with patient regarding potential benefit of TDN. Reviewed precautions and risks with patient. Extensive time spent with pt to ensure full understanding of TDN risks. Pt provided verbal consent to treatment. TDN performed to R upper trapezius, R splenius cervicis at C4-5 level, and R C4 multifidus with 0.25 x 40 single needle placements with local twitch response (LTR). Mild post-DN soreness.     Therapeutic Exercise - for improved soft tissue flexibility and extensibility as needed for ROM, improved deep neck flexor strength as needed for stabilization of cervical column, isometrics for promoting stability and analgesic effect  Deep cervical flexor nod, supine; 1 x 10, 5 sec hold  -PT demo and verbal cueing for technique  Self cervical  traction; technique reviewed  PATIENT EDUCATION: Discussed continued HEP and reviewed techniques. We discussed increased hydration and expectations for soreness/mild adverse effects post-dry needling.   *not today* Cervical spine isometrics, flexion/extension and lateral flexion R/L; reviewed with brief performance of flexion and lateral flexion isometrics for example   PATIENT EDUCATION:  Education details: see above for patient education details Person educated: Patient Education method: Explanation Education comprehension: verbalized understanding   HOME EXERCISE PROGRAM: Access Code: [email or web address] URL: [email or web address] Date: 09/24/2022 Prepared by: Consuela Mimes  Exercises - Standing Isometric Cervical  Extension with Manual Resistance  - 2 x daily - 7 x weekly - 2 sets - 10 reps - Standing Isometric Cervical Flexion with Manual Resistance  - 2 x daily - 7 x weekly - 2 sets - 10 reps - Standing Isometric Cervical Sidebending with Manual Resistance  - 2 x daily - 7 x weekly - 2 sets - 10 reps - 5sec hold - Seated Self Cervical Traction  - 2 x daily - 7 x weekly - 5-10sec hold - Supine Deep Neck Flexor Nods  - 2 x daily - 7 x weekly - 2 sets - 10 reps - 5sec hold    ASSESSMENT:  CLINICAL IMPRESSION: Patient fortunately responds well with manual interventions and has improved R upper limb pain with use of traction. We utilized DN for symptom modulation and to address taut/tender R cervical parsapinals and R upper trap; we also treated multifidus to treat via radiculopathic model for R upper quarter referred pain. Pt tolerates needling well today and reports minimal post-treatment symptoms. Pt exhibits fair C-spine ROM in all directions post-treatment today. Patient has remaining deficits in cervical spine and R shoulder ROM, R paracervical mm and UT/periscapular pain, decreased C-spine mobility. Patient will benefit from continued skilled therapeutic intervention to  address the above deficits as needed for improved function and QoL.    REHAB POTENTIAL: Fair given evidence of cervical spine instability, complicated orthopedic Hx of R shoulder  CLINICAL DECISION MAKING: Unstable/unpredictable  EVALUATION COMPLEXITY: High   GOALS: Goals reviewed with patient? Yes  SHORT TERM GOALS: Target date: 10/20/2022  Pt will be independent with HEP to improve strength and decrease neck pain to improve pain-free function at home and work. Baseline: 09/22/22: Baseline HEP initiated  Goal status: INITIAL   LONG TERM GOALS: Target date: 11/10/2022  Pt will increase FOTO to at least 55 to demonstrate significant improvement in function at home and work related to neck pain  Baseline: 09/22/22: 35 Goal status: INITIAL  2.  Pt will decrease worst neck pain by at least 2 points on the NPRS in order to demonstrate clinically significant reduction in neck pain. Baseline: 09/22/22: Pain 10/10 at worst  Goal status: INITIAL  3.  Pt will have cervical spine flexion/extension WFL and rotation to 60 deg or greater for both directions without reproduction of symptoms as needed for scanning environment and completing driving and overhead activity Baseline: 09/22/22: Motion loss with flexion, extension, L rotation  Goal status: INITIAL  4.  Pt will tolerate forward elevation of R shoulder up to 120 deg without reproduction of R upper quarter pain as needed for reaching, self-care tasks, household chores Baseline: 09/22/22: R shoulder flexion AROM up to 85 deg, pain in upper trap/upper arm.  Goal status: INITIAL   PLAN: PT FREQUENCY: 2x/week  PT DURATION: 6 weeks  PLANNED INTERVENTIONS: Therapeutic exercises, Therapeutic activity, Neuromuscular re-education, Balance training, Gait training, Patient/Family education, Joint manipulation, Joint mobilization, Dry Needling, Electrical stimulation, Spinal mobilization, Cryotherapy, Moist heat, Taping, Traction, and Manual  therapy  PLAN FOR NEXT SESSION: Manual traction and STM for symptom modulation. Continue with isometric and re-training for deep neck flexors/stabilizers. MET to improve ROM. Consider use of e-stim and DN for symptom modulation prn. Progress with postural work prn.    Consuela Mimes, PT, DPT #[id number]  Gertie Exon 09/29/2022, 10:34 AM

## 2022-10-01 ENCOUNTER — Encounter: Payer: Self-pay | Admitting: Physical Therapy

## 2022-10-01 ENCOUNTER — Telehealth: Payer: Self-pay | Admitting: Podiatry

## 2022-10-01 ENCOUNTER — Ambulatory Visit: Payer: 59 | Admitting: Physical Therapy

## 2022-10-01 DIAGNOSIS — G8929 Other chronic pain: Secondary | ICD-10-CM

## 2022-10-01 DIAGNOSIS — M542 Cervicalgia: Secondary | ICD-10-CM | POA: Diagnosis not present

## 2022-10-01 DIAGNOSIS — M4312 Spondylolisthesis, cervical region: Secondary | ICD-10-CM | POA: Diagnosis not present

## 2022-10-01 DIAGNOSIS — M6281 Muscle weakness (generalized): Secondary | ICD-10-CM

## 2022-10-01 DIAGNOSIS — M25511 Pain in right shoulder: Secondary | ICD-10-CM | POA: Diagnosis not present

## 2022-10-01 DIAGNOSIS — M5412 Radiculopathy, cervical region: Secondary | ICD-10-CM | POA: Diagnosis not present

## 2022-10-01 NOTE — Telephone Encounter (Signed)
Called pt to schedule an appt to discuss u/s results and she stated someone had called her with them. She stated she still feels like there is something in there and was not sure if she should pursue the issue more since u/s was negative. Pt wanting your advise.  She is aware Dr Lilian Kapur is out until next week.

## 2022-10-01 NOTE — Therapy (Unsigned)
OUTPATIENT PHYSICAL THERAPY TREATMENT   Patient Name: INIS BORNEMAN MRN: [id number] DOB:1950/11/11, 72 y.o., female Today's Date: 10/01/2022   PT End of Session - 10/01/22 1022     Visit Number 4    Number of Visits 13    Date for PT Re-Evaluation 11/05/22    Authorization Type [employer] Medicare 2024    Progress Note Due on Visit 10    PT Start Time 1024    PT Stop Time 1106    PT Time Calculation (min) 42 min    Activity Tolerance Patient limited by pain    Behavior During Therapy [facility] for tasks assessed/performed              Past Medical History:  Diagnosis Date   Anxiety    Chronic kidney disease    STAGE 3   Hypertension    Shoulder pain    Past Surgical History:  Procedure Laterality Date   CLOSED MANIPULATION SHOULDER WITH STERIOD INJECTION Right 08/28/2019   Procedure: MANIPULATION SHOULDER WITH STEROID INJECTION x2;  Surgeon: Signa Kell, MD;  Location: [facility];  Service: Orthopedics;  Laterality: Right;   COLONOSCOPY     COLONOSCOPY WITH PROPOFOL N/A 09/23/2021   Procedure: COLONOSCOPY WITH PROPOFOL;  Surgeon: Toney Reil, MD;  Location: [facility];  Service: Endoscopy;  Laterality: N/A;  ONE ASCENDING COLON POLYP NOT RETRIEVED   DILATION AND CURETTAGE, DIAGNOSTIC / THERAPEUTIC     POLYPECTOMY N/A 09/23/2021   Procedure: POLYPECTOMY;  Surgeon: Toney Reil, MD;  Location: [facility];  Service: Endoscopy;  Laterality: N/A;   REVERSE SHOULDER ARTHROPLASTY Right 05/20/2020   Procedure: Right reverse shoulder arthroplasty, biceps tenodesis;  Surgeon: Signa Kell, MD;  Location: [facility];  Service: Orthopedics;  Laterality: Right;   rotary cuff Right 03/06/2019   SHOULDER ARTHROSCOPY Right 08/28/2019   Procedure: Right shoulder arthroscopic capsular release, lysis of adhesion,;  Surgeon: Signa Kell, MD;  Location: [facility];  Service: Orthopedics;  Laterality: Right;   Patient Active Problem List   Diagnosis Date Noted   Left foot pain  08/07/2022   Tenderness of chest wall 06/23/2022   Cervical radicular pain 05/12/2022   Chronic left shoulder pain 12/23/2021   Chronic venous insufficiency 10/07/2021   Family history of colon cancer in mother    Polyp of ascending colon    Pain due to varicose veins of lower extremity 09/01/2021   Chronic pain syndrome 07/29/2021   Suprascapular entrapment neuropathy of right side 07/29/2021   Insomnia 07/23/2021   Raynaud's disease without gangrene 05/06/2021   Type B blood, Rh positive 05/04/2021   PAC (premature atrial contraction) 04/14/2021   Chronic right shoulder pain 12/23/2020   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/2020   Right rotator cuff tear arthropathy 12/23/2020   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   Osteopenia 02/11/2020   Vitamin D deficiency 08/18/2019   Hyperlipidemia, mixed 08/17/2019   H/O repair of right rotator cuff 04/10/2019   Benign essential HTN 03/19/2019    PCP: Marjie Skiff, NP  REFERRING PROVIDER: Venetia Night, MD  REFERRING DIAGNOSIS:  M54.2 (ICD-10-CM) - Cervicalgia  M43.12 (ICD-10-CM) - Spondylolisthesis of cervical region  M54.12 (ICD-10-CM) - Cervical radiculopathy    THERAPY DIAG: Cervicalgia  Chronic right shoulder pain  Muscle weakness (generalized)  RATIONALE FOR EVALUATION AND TREATMENT: Rehabilitation  ONSET DATE: March 2024  FOLLOW UP APPT WITH PROVIDER: Yes , 10/27/22  Pertinent History Pt is a 71 year old female referred to physical therapy  for neck pain. Per referring provider, pt has chronic right C6 radiculopathy, and anterolisthesis of C4 on C5 that meets the criteria for instability. She has significant pain with movement of her right arm around her shoulder. Pt has Hx of R RTC repair (4 years ago with Hx of frozen shoulder); pt ended up having rTSA for R shoulder due to ongoing pain/disability - pt has gone to pain management for her R shoulder for 2-3 years. She reports pain along R arm down  to her R forearm; she states it did go to her fingers, but that stopped after ESI. She has a small amount of tingling in her right hand. She states that pain in her neck started in March and has gotten worse. Pt reports pain along SCM that radiates into her R ear. Pt reports she is active and still does daily housework, walking, cleaning. Pt reports using Voltaren gel and Tylenol today and she feels this helped some. Pt reports using muscle relaxers and Gabapentin. Pt reports difficulty sleeping and being prescribed trazodone for sleep aid. Pt is substantially limited with reaching with her R arm. Difficulty with sleeping due to disturbance; pt reports having to toss and turn often; pain with R sidelying. Pt will be out of town 8/20-9/03. No diploplia, no dysphagia, dysarthria. No vertigo.    Pain:  Pain Intensity: Present: 10/10, Best: 5/10, Worst: 10/10 Pain location: R splenius cervicis and into levator region, radiates down to supraclavicular region, some pain into R periscapular region  Pain Quality: sharp  Radiating: Yes , into R arm  Numbness/Tingling: Yes, pt reports NT into neck and shoulder Focal Weakness: Hx of difficulty with gripping/holding items; epidural did help with this  Aggravating factors: cooking/cleaning/R upper limb activity, prolonged sitting, elevating R shoulder, looking up/down  Relieving factors: Rx medication (helps moderately), Voltaren gel 24-hour pain behavior: continuous pain regardless of time of day  History of prior neck injury, pain, surgery, or therapy: Yes; R RTA, hx ESI of C-spine Falls: Has patient fallen in last 6 months? No, Number of falls: N/A Follow-up appointment with MD: Yes f/u    10/27/22 Dominant hand: right Imaging: Yes ;   MRI C spine 03/25/2022 IMPRESSION: 1. Mild central canal stenosis and mild foraminal stenosis on the left at C4-5. 2. Right paracentral disc protrusion and spurring at C5-6 with cord flattening on the right.      Electronically Signed   By: Marlan Palau M.D.   On: 03/25/2022 11:43   Flexion-extension x-rays on March 30, 2022 show 3.6 mm of anterolisthesis of C4 on C5 in flexion that shows approximately 1 mm of retrolisthesis on extension.  -Per Dr. Myer Haff, anterolisthesis of C4 on C5 that meets the criteria for instability.   Prior level of function: Independent Occupational demands: [employer] Hobbies: crocheting  Red flags (personal history of cancer, h/o spinal tumors, history of compression fracture, chills/fever, night sweats, nausea, vomiting, unrelenting pain): Negative  Intermittent night sweats  Patient Goals: Pain relief; more use of R arm    OBJECTIVE:    Posture Forward head, mild rounded shoulders   AROM AROM (Normal range in degrees) AROM 09/22/2022  Cervical  Flexion (50) 32  Extension (80) 44  Right lateral flexion (45) 48*  Left lateral flexion (45) 38*  Right rotation (85) 62  Left rotation (85) 49  (* = pain; Blank rows = not tested)  R shoulder forward elevation AROM to 85 deg only, L shoulder AROM grossly WNL   MMT MMT (  out of 5) Right 09/22/2022 Left 09/22/2022  Cervical (isometric)  Flexion 4/5*  Extension 3+/5*  Lateral Flexion 3+/5* (R shoulder pain) WNL  Rotation WNL WNL      Shoulder   Flexion 2-/5  4/5*  Extension    Abduction 2-/5  3+/5*  Internal rotation    Horizontal abduction    External rotation  3+/5* 4-/5*  Lower Trapezius    Rhomboids        Elbow  Flexion 4+ 4+  Extension 4+ 4+  Pronation    Supination        (* = pain; Blank rows = not tested)    Palpation Location LEFT  RIGHT           Suboccipitals    Cervical paraspinals 1 2  Upper Trapezius 0 2  Levator Scapulae 0 1  Rhomboid Major/Minor 0 1  (Blank rows = not tested) Graded on 0-4 scale (0 = no pain, 1 = pain, 2 = pain with wincing/grimacing/flinching, 3 = pain with withdrawal, 4 = unwilling to allow palpation), (Blank rows = not tested)  Repeated  Movements Repeated cervical retraction in hooklying: increase, worse (increase in R upper trap/paracervical pain)  Passive Accessory Intervertebral Motion Deferred today     SPECIAL TESTS Spurlings A (ipsilateral lateral flexion/axial compression): R: Positive L: Negative Distraction Test: Positive for pain relief   Hoffman Sign (cervical cord compression): R: Negative L: Negative ULTT Median: R: Not examined L: Not examined ULTT Ulnar: R: Not examined L: Not examined ULTT Radial: R: Not examined L: Not examined     TODAY'S TREATMENT   SUBJECTIVE STATEMENT: Pt reports notable pain this past Tuesday and remaining pain affecting her on Wednesday. She reports having R and L upper trap/paracervical pain and also having pain into periscapular region. Pt reports having bad day yesterday and missing church. Patient reports 7/10 pain at arrival.     Manual Therapy - for symptom modulation, soft tissue sensitivity and mobility, joint mobility, ROM   Manual cervical traction, general; 10 sec on, 5 sec off; x 10 minutes STM/DTM R>L upper trapezius and C3-6 splenius cervicis/capitis x 15 minutes   *not today* Passive median nerve gliding with scapular depression; R upper limb with ULTT2A position; x20   Trigger Point Dry Needling (TDN) with electrical stimulation, dry needling unbilled Education performed with patient regarding potential benefits and risks of TDN at previous visit. Pt provided verbal consent to treatment. TDN performed to bilateral C4 and bilateral T2 multifidi with needles kept in for electric stimulation utilizing E-Stim II unit. 7.5 minutes at 3.5 intensity with 5 pps frequency; 7.5 minutes at 3.5 intensity with 75 pps with microcurrent.  Improved pain-free motion following intervention.      Therapeutic Exercise - for improved soft tissue flexibility and extensibility as needed for ROM, improved deep neck flexor strength as needed for stabilization of cervical column,  isometrics for promoting stability and analgesic effect  Deep cervical flexor nod, supine; 1 x 10, 5 sec hold  -PT demo and verbal cueing for technique  Self cervical traction; technique reviewed  PATIENT EDUCATION: Discussed continued HEP and reviewed techniques. We discussed increased hydration and expectations for soreness/mild adverse effects post-dry needling.   *not today* Cervical spine isometrics, flexion/extension and lateral flexion R/L; reviewed with brief performance of flexion and lateral flexion isometrics for example   PATIENT EDUCATION:  Education details: see above for patient education details Person educated: Patient Education method: Explanation Education comprehension: verbalized understanding   HOME  EXERCISE PROGRAM: Access Code: [email or web address] URL: [email or web address] Date: 09/24/2022 Prepared by: Consuela Mimes  Exercises - Standing Isometric Cervical Extension with Manual Resistance  - 2 x daily - 7 x weekly - 2 sets - 10 reps - Standing Isometric Cervical Flexion with Manual Resistance  - 2 x daily - 7 x weekly - 2 sets - 10 reps - Standing Isometric Cervical Sidebending with Manual Resistance  - 2 x daily - 7 x weekly - 2 sets - 10 reps - 5sec hold - Seated Self Cervical Traction  - 2 x daily - 7 x weekly - 5-10sec hold - Supine Deep Neck Flexor Nods  - 2 x daily - 7 x weekly - 2 sets - 10 reps - 5sec hold    ASSESSMENT:  CLINICAL IMPRESSION: Patient fortunately responds well with manual interventions and has improved R upper limb pain with use of traction. We utilized DN for symptom modulation and to address taut/tender R cervical parsapinals and R upper trap; we also treated multifidus to treat via radiculopathic model for R upper quarter referred pain. Pt tolerates needling well today and reports minimal post-treatment symptoms. Pt exhibits fair C-spine ROM in all directions post-treatment today. Patient has remaining deficits in  cervical spine and R shoulder ROM, R paracervical mm and UT/periscapular pain, decreased C-spine mobility. Patient will benefit from continued skilled therapeutic intervention to address the above deficits as needed for improved function and QoL.    REHAB POTENTIAL: Fair given evidence of cervical spine instability, complicated orthopedic Hx of R shoulder  CLINICAL DECISION MAKING: Unstable/unpredictable  EVALUATION COMPLEXITY: High   GOALS: Goals reviewed with patient? Yes  SHORT TERM GOALS: Target date: 10/22/2022  Pt will be independent with HEP to improve strength and decrease neck pain to improve pain-free function at home and work. Baseline: 09/22/22: Baseline HEP initiated  Goal status: INITIAL   LONG TERM GOALS: Target date: 11/12/2022  Pt will increase FOTO to at least 55 to demonstrate significant improvement in function at home and work related to neck pain  Baseline: 09/22/22: 35 Goal status: INITIAL  2.  Pt will decrease worst neck pain by at least 2 points on the NPRS in order to demonstrate clinically significant reduction in neck pain. Baseline: 09/22/22: Pain 10/10 at worst  Goal status: INITIAL  3.  Pt will have cervical spine flexion/extension WFL and rotation to 60 deg or greater for both directions without reproduction of symptoms as needed for scanning environment and completing driving and overhead activity Baseline: 09/22/22: Motion loss with flexion, extension, L rotation  Goal status: INITIAL  4.  Pt will tolerate forward elevation of R shoulder up to 120 deg without reproduction of R upper quarter pain as needed for reaching, self-care tasks, household chores Baseline: 09/22/22: R shoulder flexion AROM up to 85 deg, pain in upper trap/upper arm.  Goal status: INITIAL   PLAN: PT FREQUENCY: 2x/week  PT DURATION: 6 weeks  PLANNED INTERVENTIONS: Therapeutic exercises, Therapeutic activity, Neuromuscular re-education, Balance training, Gait training,  Patient/Family education, Joint manipulation, Joint mobilization, Dry Needling, Electrical stimulation, Spinal mobilization, Cryotherapy, Moist heat, Taping, Traction, and Manual therapy  PLAN FOR NEXT SESSION: Manual traction and STM for symptom modulation. Continue with isometric and re-training for deep neck flexors/stabilizers. MET to improve ROM. Consider use of e-stim and DN for symptom modulation prn. Progress with postural work prn.    Consuela Mimes, PT, DPT #[id number]  Gertie Exon 10/01/2022, 10:22 AM

## 2022-10-05 NOTE — Telephone Encounter (Signed)
Left message for pt of what Dr Lilian Kapur said and to call if she would like to schedule an appt.

## 2022-10-06 ENCOUNTER — Ambulatory Visit: Payer: 59 | Admitting: Physical Therapy

## 2022-10-06 ENCOUNTER — Encounter: Payer: Self-pay | Admitting: Physical Therapy

## 2022-10-06 DIAGNOSIS — M542 Cervicalgia: Secondary | ICD-10-CM | POA: Diagnosis not present

## 2022-10-06 DIAGNOSIS — M6281 Muscle weakness (generalized): Secondary | ICD-10-CM

## 2022-10-06 DIAGNOSIS — M5412 Radiculopathy, cervical region: Secondary | ICD-10-CM | POA: Diagnosis not present

## 2022-10-06 DIAGNOSIS — G8929 Other chronic pain: Secondary | ICD-10-CM | POA: Diagnosis not present

## 2022-10-06 DIAGNOSIS — M4312 Spondylolisthesis, cervical region: Secondary | ICD-10-CM | POA: Diagnosis not present

## 2022-10-06 DIAGNOSIS — M25511 Pain in right shoulder: Secondary | ICD-10-CM | POA: Diagnosis not present

## 2022-10-06 NOTE — Therapy (Signed)
OUTPATIENT PHYSICAL THERAPY TREATMENT   Patient Name: Danielle Stephens MRN: [id number] DOB:06-22-1950, 73 y.o., female Today's Date: 10/06/2022   PT End of Session - 10/06/22 0848     Visit Number 5    Number of Visits 13    Date for PT Re-Evaluation 11/05/22    Authorization Type [employer] Medicare 2024    Progress Note Due on Visit 10    PT Start Time 0819    PT Stop Time 0902    PT Time Calculation (min) 43 min    Activity Tolerance Patient limited by pain    Behavior During Therapy [facility] for tasks assessed/performed             Past Medical History:  Diagnosis Date   Anxiety    Chronic kidney disease    STAGE 3   Hypertension    Shoulder pain    Past Surgical History:  Procedure Laterality Date   CLOSED MANIPULATION SHOULDER WITH STERIOD INJECTION Right 08/28/2019   Procedure: MANIPULATION SHOULDER WITH STEROID INJECTION x2;  Surgeon: Signa Kell, MD;  Location: [facility];  Service: Orthopedics;  Laterality: Right;   COLONOSCOPY     COLONOSCOPY WITH PROPOFOL N/A 09/23/2021   Procedure: COLONOSCOPY WITH PROPOFOL;  Surgeon: Toney Reil, MD;  Location: [facility];  Service: Endoscopy;  Laterality: N/A;  ONE ASCENDING COLON POLYP NOT RETRIEVED   DILATION AND CURETTAGE, DIAGNOSTIC / THERAPEUTIC     POLYPECTOMY N/A 09/23/2021   Procedure: POLYPECTOMY;  Surgeon: Toney Reil, MD;  Location: [facility];  Service: Endoscopy;  Laterality: N/A;   REVERSE SHOULDER ARTHROPLASTY Right 05/20/2020   Procedure: Right reverse shoulder arthroplasty, biceps tenodesis;  Surgeon: Signa Kell, MD;  Location: [facility];  Service: Orthopedics;  Laterality: Right;   rotary cuff Right 03/06/2019   SHOULDER ARTHROSCOPY Right 08/28/2019   Procedure: Right shoulder arthroscopic capsular release, lysis of adhesion,;  Surgeon: Signa Kell, MD;  Location: [facility];  Service: Orthopedics;  Laterality: Right;   Patient Active Problem List   Diagnosis Date Noted   Left foot pain  08/07/2022   Tenderness of chest wall 06/23/2022   Cervical radicular pain 05/12/2022   Chronic left shoulder pain 12/23/2021   Chronic venous insufficiency 10/07/2021   Family history of colon cancer in mother    Polyp of ascending colon    Pain due to varicose veins of lower extremity 09/01/2021   Chronic pain syndrome 07/29/2021   Suprascapular entrapment neuropathy of right side 07/29/2021   Insomnia 07/23/2021   Raynaud's disease without gangrene 05/06/2021   Type B blood, Rh positive 05/04/2021   PAC (premature atrial contraction) 04/14/2021   Chronic right shoulder pain 12/23/2020   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/2020   Right rotator cuff tear arthropathy 12/23/2020   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   Osteopenia 02/11/2020   Vitamin D deficiency 08/18/2019   Hyperlipidemia, mixed 08/17/2019   H/O repair of right rotator cuff 04/10/2019   Benign essential HTN 03/19/2019    PCP: Marjie Skiff, NP  REFERRING PROVIDER: Venetia Night, MD  REFERRING DIAGNOSIS:  M54.2 (ICD-10-CM) - Cervicalgia  M43.12 (ICD-10-CM) - Spondylolisthesis of cervical region  M54.12 (ICD-10-CM) - Cervical radiculopathy    THERAPY DIAG: Cervicalgia  Chronic right shoulder pain  Muscle weakness (generalized)  RATIONALE FOR EVALUATION AND TREATMENT: Rehabilitation  ONSET DATE: March 2024  FOLLOW UP APPT WITH PROVIDER: Yes , 10/27/22  Pertinent History Pt is a 72 year old female referred to physical therapy for  neck pain. Per referring provider, pt has chronic right C6 radiculopathy, and anterolisthesis of C4 on C5 that meets the criteria for instability. She has significant pain with movement of her right arm around her shoulder. Pt has Hx of R RTC repair (4 years ago with Hx of frozen shoulder); pt ended up having rTSA for R shoulder due to ongoing pain/disability - pt has gone to pain management for her R shoulder for 2-3 years. She reports pain along R arm down  to her R forearm; she states it did go to her fingers, but that stopped after ESI. She has a small amount of tingling in her right hand. She states that pain in her neck started in March and has gotten worse. Pt reports pain along SCM that radiates into her R ear. Pt reports she is active and still does daily housework, walking, cleaning. Pt reports using Voltaren gel and Tylenol today and she feels this helped some. Pt reports using muscle relaxers and Gabapentin. Pt reports difficulty sleeping and being prescribed trazodone for sleep aid. Pt is substantially limited with reaching with her R arm. Difficulty with sleeping due to disturbance; pt reports having to toss and turn often; pain with R sidelying. Pt will be out of town 8/20-9/03. No diploplia, no dysphagia, dysarthria. No vertigo.    Pain:  Pain Intensity: Present: 10/10, Best: 5/10, Worst: 10/10 Pain location: R splenius cervicis and into levator region, radiates down to supraclavicular region, some pain into R periscapular region  Pain Quality: sharp  Radiating: Yes , into R arm  Numbness/Tingling: Yes, pt reports NT into neck and shoulder Focal Weakness: Hx of difficulty with gripping/holding items; epidural did help with this  Aggravating factors: cooking/cleaning/R upper limb activity, prolonged sitting, elevating R shoulder, looking up/down  Relieving factors: Rx medication (helps moderately), Voltaren gel 24-hour pain behavior: continuous pain regardless of time of day  History of prior neck injury, pain, surgery, or therapy: Yes; R RTA, hx ESI of C-spine Falls: Has patient fallen in last 6 months? No, Number of falls: N/A Follow-up appointment with MD: Yes f/u    10/27/22 Dominant hand: right Imaging: Yes ;   MRI C spine 03/25/2022 IMPRESSION: 1. Mild central canal stenosis and mild foraminal stenosis on the left at C4-5. 2. Right paracentral disc protrusion and spurring at C5-6 with cord flattening on the right.      Electronically Signed   By: Marlan Palau M.D.   On: 03/25/2022 11:43   Flexion-extension x-rays on March 30, 2022 show 3.6 mm of anterolisthesis of C4 on C5 in flexion that shows approximately 1 mm of retrolisthesis on extension.  -Per Dr. Myer Haff, anterolisthesis of C4 on C5 that meets the criteria for instability.   Prior level of function: Independent Occupational demands: [employer] Hobbies: crocheting  Red flags (personal history of cancer, h/o spinal tumors, history of compression fracture, chills/fever, night sweats, nausea, vomiting, unrelenting pain): Negative  Intermittent night sweats  Patient Goals: Pain relief; more use of R arm    OBJECTIVE:    Posture Forward head, mild rounded shoulders   AROM AROM (Normal range in degrees) AROM 09/22/2022  Cervical  Flexion (50) 32  Extension (80) 44  Right lateral flexion (45) 48*  Left lateral flexion (45) 38*  Right rotation (85) 62  Left rotation (85) 49  (* = pain; Blank rows = not tested)  R shoulder forward elevation AROM to 85 deg only, L shoulder AROM grossly WNL   MMT MMT (out  of 5) Right 09/22/2022 Left 09/22/2022  Cervical (isometric)  Flexion 4/5*  Extension 3+/5*  Lateral Flexion 3+/5* (R shoulder pain) WNL  Rotation WNL WNL      Shoulder   Flexion 2-/5  4/5*  Extension    Abduction 2-/5  3+/5*  Internal rotation    Horizontal abduction    External rotation  3+/5* 4-/5*  Lower Trapezius    Rhomboids        Elbow  Flexion 4+ 4+  Extension 4+ 4+  Pronation    Supination        (* = pain; Blank rows = not tested)    Palpation Location LEFT  RIGHT           Suboccipitals    Cervical paraspinals 1 2  Upper Trapezius 0 2  Levator Scapulae 0 1  Rhomboid Major/Minor 0 1  (Blank rows = not tested) Graded on 0-4 scale (0 = no pain, 1 = pain, 2 = pain with wincing/grimacing/flinching, 3 = pain with withdrawal, 4 = unwilling to allow palpation), (Blank rows = not tested)  Repeated  Movements Repeated cervical retraction in hooklying: increase, worse (increase in R upper trap/paracervical pain)  Passive Accessory Intervertebral Motion Deferred today     SPECIAL TESTS Spurlings A (ipsilateral lateral flexion/axial compression): R: Positive L: Negative Distraction Test: Positive for pain relief   Hoffman Sign (cervical cord compression): R: Negative L: Negative ULTT Median: R: Not examined L: Not examined ULTT Ulnar: R: Not examined L: Not examined ULTT Radial: R: Not examined L: Not examined     TODAY'S TREATMENT   SUBJECTIVE STATEMENT: Pt reports good response to last visit. She reports 2-3/10 pain today versus 6-7/10 last week. Patient reports compliance with HEP. Patient reports tolerating turning her head better.     Manual Therapy - for symptom modulation, soft tissue sensitivity and mobility, joint mobility, ROM   Manual cervical traction, general; 10 sec on, 5 sec off; x 10 minutes STM/DTM R>L upper trapezius and C3-6 splenius cervicis/capitis x 15 minutes   *not today* Passive median nerve gliding with scapular depression; R upper limb with ULTT2A position; x20    Trigger Point Dry Needling (TDN) with electrical stimulation, dry needling unbilled Education performed with patient regarding potential benefits and risks of TDN at previous visit. Pt provided verbal consent to treatment. TDN performed to bilateral C4 and bilateral T2 multifidi with needles kept in for electric stimulation utilizing E-Stim II unit. 7.5 minutes at 3.5 intensity with 5 pps frequency; 7.5 minutes at 3.5 intensity with 75 pps with microcurrent. Pt demonstrates flexion-extension and R/L rotation [facility] following treatment today.      Therapeutic Exercise - for improved soft tissue flexibility and extensibility as needed for ROM, improved deep neck flexor strength as needed for stabilization of cervical column, isometrics for promoting stability and analgesic  effect      *not today* Deep cervical flexor nod, supine; 1 x 10, 5 sec hold  -PT demo and verbal cueing for technique Self cervical traction; technique reviewed Cervical spine isometrics, flexion/extension and lateral flexion R/L; reviewed with brief performance of flexion and lateral flexion isometrics for example    PATIENT EDUCATION:  Education details: HEP, PT POC Person educated: Patient Education method: Explanation Education comprehension: verbalized understanding   HOME EXERCISE PROGRAM: Access Code: [email or web address] URL: [email or web address] Date: 09/24/2022 Prepared by: Consuela Mimes  Exercises - Standing Isometric Cervical Extension with Manual Resistance  - 2 x daily - 7 x weekly - 2 sets -  10 reps - Standing Isometric Cervical Flexion with Manual Resistance  - 2 x daily - 7 x weekly - 2 sets - 10 reps - Standing Isometric Cervical Sidebending with Manual Resistance  - 2 x daily - 7 x weekly - 2 sets - 10 reps - 5sec hold - Seated Self Cervical Traction  - 2 x daily - 7 x weekly - 5-10sec hold - Supine Deep Neck Flexor Nods  - 2 x daily - 7 x weekly - 2 sets - 10 reps - 5sec hold    ASSESSMENT:  CLINICAL IMPRESSION: Patient did exhibit good C-spine ROM after TDN last visit and she reported tolerating session well acutely. Pt did have flare-up Tuesday afternoon and had ongoing pain Wednesday preventing her from participating in Wednesday evening church service. Pt reports feeling better today following initial trial of DN with e-stim. We will continue with DN +/- e-stim next visit as needed pending further feedback on treatment response. Patient has remaining deficits in cervical spine and R shoulder ROM, R paracervical mm and UT/periscapular pain, decreased C-spine mobility. Patient will benefit from continued skilled therapeutic intervention to address the above deficits as needed for improved function and QoL.    REHAB POTENTIAL: Fair given evidence  of cervical spine instability, complicated orthopedic Hx of R shoulder  CLINICAL DECISION MAKING: Unstable/unpredictable  EVALUATION COMPLEXITY: High   GOALS: Goals reviewed with patient? Yes  SHORT TERM GOALS: Target date: 10/27/2022  Pt will be independent with HEP to improve strength and decrease neck pain to improve pain-free function at home and work. Baseline: 09/22/22: Baseline HEP initiated  Goal status: INITIAL   LONG TERM GOALS: Target date: 11/17/2022  Pt will increase FOTO to at least 55 to demonstrate significant improvement in function at home and work related to neck pain  Baseline: 09/22/22: 35 Goal status: INITIAL  2.  Pt will decrease worst neck pain by at least 2 points on the NPRS in order to demonstrate clinically significant reduction in neck pain. Baseline: 09/22/22: Pain 10/10 at worst  Goal status: INITIAL  3.  Pt will have cervical spine flexion/extension WFL and rotation to 60 deg or greater for both directions without reproduction of symptoms as needed for scanning environment and completing driving and overhead activity Baseline: 09/22/22: Motion loss with flexion, extension, L rotation  Goal status: INITIAL  4.  Pt will tolerate forward elevation of R shoulder up to 120 deg without reproduction of R upper quarter pain as needed for reaching, self-care tasks, household chores Baseline: 09/22/22: R shoulder flexion AROM up to 85 deg, pain in upper trap/upper arm.  Goal status: INITIAL   PLAN: PT FREQUENCY: 2x/week  PT DURATION: 6 weeks  PLANNED INTERVENTIONS: Therapeutic exercises, Therapeutic activity, Neuromuscular re-education, Balance training, Gait training, Patient/Family education, Joint manipulation, Joint mobilization, Dry Needling, Electrical stimulation, Spinal mobilization, Cryotherapy, Moist heat, Taping, Traction, and Manual therapy  PLAN FOR NEXT SESSION: Manual traction and STM for symptom modulation. Continue with isometric and re-training  for deep neck flexors/stabilizers. MET to improve ROM. Consider use of e-stim and DN for symptom modulation prn. Progress with postural work prn.    Consuela Mimes, PT, DPT #[id number]  Gertie Exon 10/06/2022, 8:48 AM

## 2022-10-08 ENCOUNTER — Ambulatory Visit: Payer: 59 | Admitting: Physical Therapy

## 2022-10-13 ENCOUNTER — Ambulatory Visit (INDEPENDENT_AMBULATORY_CARE_PROVIDER_SITE_OTHER): Payer: 59 | Admitting: Nurse Practitioner

## 2022-10-13 ENCOUNTER — Encounter: Payer: Self-pay | Admitting: Nurse Practitioner

## 2022-10-13 ENCOUNTER — Ambulatory Visit: Payer: 59 | Admitting: Physical Therapy

## 2022-10-13 VITALS — BP 114/68 | HR 76 | Temp 98.2°F | Ht 62.01 in | Wt 112.4 lb

## 2022-10-13 DIAGNOSIS — N952 Postmenopausal atrophic vaginitis: Secondary | ICD-10-CM

## 2022-10-13 DIAGNOSIS — R61 Generalized hyperhidrosis: Secondary | ICD-10-CM | POA: Diagnosis not present

## 2022-10-13 DIAGNOSIS — G4709 Other insomnia: Secondary | ICD-10-CM | POA: Diagnosis not present

## 2022-10-13 DIAGNOSIS — M542 Cervicalgia: Secondary | ICD-10-CM

## 2022-10-13 DIAGNOSIS — G8929 Other chronic pain: Secondary | ICD-10-CM | POA: Diagnosis not present

## 2022-10-13 MED ORDER — TRAZODONE HCL 100 MG PO TABS: 100.0000 mg | ORAL_TABLET | Freq: Every evening | ORAL | 4 refills | Status: DC | PRN

## 2022-10-13 NOTE — Patient Instructions (Signed)
Insomnia Insomnia is a sleep disorder that makes it difficult to fall asleep or stay asleep. Insomnia can cause fatigue, low energy, difficulty concentrating, mood swings, and poor performance at work or school. There are three different ways to classify insomnia: Difficulty falling asleep. Difficulty staying asleep. Waking up too early in the morning. Any type of insomnia can be long-term (chronic) or short-term (acute). Both are common. Short-term insomnia usually lasts for 3 months or less. Chronic insomnia occurs at least three times a week for longer than 3 months. What are the causes? Insomnia may be caused by another condition, situation, or substance, such as: Having certain mental health conditions, such as anxiety and depression. Using caffeine, alcohol, tobacco, or drugs. Having gastrointestinal conditions, such as gastroesophageal reflux disease (GERD). Having certain medical conditions. These include: Asthma. Alzheimer's disease. Stroke. Chronic pain. An overactive thyroid gland (hyperthyroidism). Other sleep disorders, such as restless legs syndrome and sleep apnea. Menopause. Sometimes, the cause of insomnia may not be known. What increases the risk? Risk factors for insomnia include: Gender. Females are affected more often than males. Age. Insomnia is more common as people get older. Stress and certain medical and mental health conditions. Lack of exercise. Having an irregular work schedule. This may include working night shifts and traveling between different time zones. What are the signs or symptoms? If you have insomnia, the main symptom is having trouble falling asleep or having trouble staying asleep. This may lead to other symptoms, such as: Feeling tired or having low energy. Feeling nervous about going to sleep. Not feeling rested in the morning. Having trouble concentrating. Feeling irritable, anxious, or depressed. How is this diagnosed? This condition  may be diagnosed based on: Your symptoms and medical history. Your health care provider may ask about: Your sleep habits. Any medical conditions you have. Your mental health. A physical exam. How is this treated? Treatment for insomnia depends on the cause. Treatment may focus on treating an underlying condition that is causing the insomnia. Treatment may also include: Medicines to help you sleep. Counseling or therapy. Lifestyle adjustments to help you sleep better. Follow these instructions at home: Eating and drinking  Limit or avoid alcohol, caffeinated beverages, and products that contain nicotine and tobacco, especially close to bedtime. These can disrupt your sleep. Do not eat a large meal or eat spicy foods right before bedtime. This can lead to digestive discomfort that can make it hard for you to sleep. Sleep habits  Keep a sleep diary to help you and your health care provider figure out what could be causing your insomnia. Write down: When you sleep. When you wake up during the night. How well you sleep and how rested you feel the next day. Any side effects of medicines you are taking. What you eat and drink. Make your bedroom a dark, comfortable place where it is easy to fall asleep. Put up shades or blackout curtains to block light from outside. Use a white noise machine to block noise. Keep the temperature cool. Limit screen use before bedtime. This includes: Not watching TV. Not using your smartphone, tablet, or computer. Stick to a routine that includes going to bed and waking up at the same times every day and night. This can help you fall asleep faster. Consider making a quiet activity, such as reading, part of your nighttime routine. Try to avoid taking naps during the day so that you sleep better at night. Get out of bed if you are still awake after   15 minutes of trying to sleep. Keep the lights down, but try reading or doing a quiet activity. When you feel  sleepy, go back to bed. General instructions Take over-the-counter and prescription medicines only as told by your health care provider. Exercise regularly as told by your health care provider. However, avoid exercising in the hours right before bedtime. Use relaxation techniques to manage stress. Ask your health care provider to suggest some techniques that may work well for you. These may include: Breathing exercises. Routines to release muscle tension. Visualizing peaceful scenes. Make sure that you drive carefully. Do not drive if you feel very sleepy. Keep all follow-up visits. This is important. Contact a health care provider if: You are tired throughout the day. You have trouble in your daily routine due to sleepiness. You continue to have sleep problems, or your sleep problems get worse. Get help right away if: You have thoughts about hurting yourself or someone else. Get help right away if you feel like you may hurt yourself or others, or have thoughts about taking your own life. Go to your nearest emergency room or: Call 911. Call the National Suicide Prevention Lifeline at 1-800-273-8255 or 988. This is open 24 hours a day. Text the Crisis Text Line at 741741. Summary Insomnia is a sleep disorder that makes it difficult to fall asleep or stay asleep. Insomnia can be long-term (chronic) or short-term (acute). Treatment for insomnia depends on the cause. Treatment may focus on treating an underlying condition that is causing the insomnia. Keep a sleep diary to help you and your health care provider figure out what could be causing your insomnia. This information is not intended to replace advice given to you by your health care provider. Make sure you discuss any questions you have with your health care provider. Document Revised: 02/10/2021 Document Reviewed: 02/10/2021 [employer] Patient Education  2024 [employer]  

## 2022-10-13 NOTE — Progress Notes (Signed)
BP 114/68   Pulse 76   Temp 98.2 F (36.8 C) (Oral)   Ht 5' 2.01" (1.575 m)   Wt 112 lb 6.4 oz (51 kg)   SpO2 97%   BMI 20.55 kg/m    Subjective:    Patient ID: Danielle Stephens, female    DOB: [birth year], 72 y.o.   MRN: [id number]  HPI: Danielle Stephens is a 72 y.o. female  Chief Complaint  Patient presents with   Insomnia    Patient is taking trazodone and it does not seem to help   Vaginal Dryness   Headache   INSOMNIA Taking Trazodone 50 MG at night -- she took another 1/2 and this did not help last night. Reports poor sleep pattern.  Has underlying chronic pain, followed by Dr. Marcell Barlow -- will do possible cervical spine surgery, but is doing PT first. Taking only as needed. Takes Gabapentin at times at night or muscle relaxer for pain + Tylenol.  Not seeing pain management at present, last saw 05/25/22.  Is having headaches on occasion with neck pain.  No headache to day.  At times these are in behind her left eye.  These last only a short period, takes a Tylenol and headache goes away.  No red flag signs with headaches.  Has had 2 in past 3-4 months.   Duration: chronic Satisfied with sleep quality: no Difficulty falling asleep: yes Difficulty staying asleep: yes Waking a few hours after sleep onset: yes Early morning awakenings: yes Daytime hypersomnolence: no Wakes feeling refreshed: yes Good sleep hygiene: yes Apnea: no Snoring: no Depressed/anxious mood: no Recent stress: no Restless legs/nocturnal leg cramps: no Chronic pain/arthritis: yes History of sleep study: no Treatments attempted: melatonin and benadryl ,Trazodone   MENOPAUSAL SYMPTOMS Having vaginal dryness making intercourse uncomfortable.  Having occasional night sweats. Gravida/Para: 2/1 Duration: stable Symptom severity: mild Hot flashes: no Night sweats:  occasional with night medications Sleep disturbances: yes Vaginal dryness: yes Dyspareunia:no Decreased libido: no Emotional lability:  no Stress incontinence: no Previous HRT/pharmacotherapy: no Hysterectomy: no Absolute Contraindications to Hormonal Therapy:     Undiagnosed vaginal bleeding: no    Breast cancer: no    Endometrial cancer: no    Coronary disease: no    Cerebrovascular disease: no    Venous thromboembolic disease: no    Relevant past medical, surgical, family and social history reviewed and updated as indicated. Interim medical history since our last visit reviewed. Allergies and medications reviewed and updated.  Review of Systems  Constitutional:  Negative for activity change, appetite change, diaphoresis, fatigue and fever.  Respiratory:  Negative for cough, chest tightness, shortness of breath and wheezing.   Cardiovascular:  Negative for chest pain, palpitations and leg swelling.  Gastrointestinal: Negative.   Endocrine: Negative for cold intolerance, heat intolerance, polydipsia, polyphagia and polyuria.  Neurological:  Positive for headaches. Negative for dizziness, syncope, weakness, light-headedness and numbness.  Psychiatric/Behavioral:  Positive for sleep disturbance. Negative for decreased concentration, self-injury and suicidal ideas.     Per HPI unless specifically indicated above     Objective:    BP 114/68   Pulse 76   Temp 98.2 F (36.8 C) (Oral)   Ht 5' 2.01" (1.575 m)   Wt 112 lb 6.4 oz (51 kg)   SpO2 97%   BMI 20.55 kg/m   Wt Readings from Last 3 Encounters:  10/13/22 112 lb 6.4 oz (51 kg)  09/01/22 118 lb (53.5 kg)  08/06/22 111 lb (50.3 kg)  Physical Exam Vitals and nursing note reviewed.  Constitutional:      General: She is awake. She is not in acute distress.    Appearance: She is well-developed and well-groomed. She is not ill-appearing or toxic-appearing.  HENT:     Head: Normocephalic.     Right Ear: Hearing and external ear normal.     Left Ear: Hearing and external ear normal.  Eyes:     General: Lids are normal.        Right eye: No discharge.         Left eye: No discharge.     Conjunctiva/sclera: Conjunctivae normal.     Pupils: Pupils are equal, round, and reactive to light.  Neck:     Thyroid: No thyromegaly.     Vascular: No carotid bruit.  Cardiovascular:     Rate and Rhythm: Normal rate and regular rhythm.     Heart sounds: Normal heart sounds. No murmur heard.    No gallop.  Pulmonary:     Effort: Pulmonary effort is normal. No accessory muscle usage or respiratory distress.     Breath sounds: Normal breath sounds.  Abdominal:     General: Bowel sounds are normal. There is no distension.     Palpations: Abdomen is soft.     Tenderness: There is no abdominal tenderness.  Musculoskeletal:     Cervical back: Neck supple. No rigidity. Pain with movement present. No spinous process tenderness. Decreased range of motion.     Right lower leg: No edema.     Left lower leg: No edema.  Lymphadenopathy:     Head:     Right side of head: No submental, submandibular, tonsillar, preauricular or posterior auricular adenopathy.     Left side of head: No submental, submandibular, tonsillar, preauricular or posterior auricular adenopathy.     Cervical: No cervical adenopathy.  Skin:    General: Skin is warm and dry.  Neurological:     Mental Status: She is alert and oriented to person, place, and time.     Cranial Nerves: Cranial nerves 2-12 are intact.     Motor: Motor function is intact.     Coordination: Coordination is intact.     Gait: Gait is intact.     Deep Tendon Reflexes: Reflexes are normal and symmetric.     Reflex Scores:      Brachioradialis reflexes are 2+ on the right side and 2+ on the left side.      Patellar reflexes are 2+ on the right side and 2+ on the left side. Psychiatric:        Attention and Perception: Attention normal.        Mood and Affect: Mood normal.        Speech: Speech normal.        Behavior: Behavior normal. Behavior is cooperative.        Thought Content: Thought content normal.     Results for orders placed or performed in visit on 08/04/22  CBC with Differential/Platelet  Result Value Ref Range   WBC 4.2 3.4 - 10.8 x10E3/uL   RBC 4.88 3.77 - 5.28 x10E6/uL   Hemoglobin 13.4 11.1 - 15.9 g/dL   Hematocrit 82.9 56.2 - 46.6 %   MCV 84 79 - 97 fL   MCH 27.5 26.6 - 33.0 pg   MCHC 32.6 31.5 - 35.7 g/dL   RDW 13.0 86.5 - 78.4 %   Platelets 196 150 - 450 x10E3/uL   Neutrophils  66 Not Estab. %   Lymphs 21 Not Estab. %   Monocytes 8 Not Estab. %   Eos 4 Not Estab. %   Basos 1 Not Estab. %   Neutrophils Absolute 2.8 1.4 - 7.0 x10E3/uL   Lymphocytes Absolute 0.9 0.7 - 3.1 x10E3/uL   Monocytes Absolute 0.3 0.1 - 0.9 x10E3/uL   EOS (ABSOLUTE) 0.2 0.0 - 0.4 x10E3/uL   Basophils Absolute 0.0 0.0 - 0.2 x10E3/uL   Immature Granulocytes 0 Not Estab. %   Immature Grans (Abs) 0.0 0.0 - 0.1 x10E3/uL  Comprehensive metabolic panel  Result Value Ref Range   Glucose 94 70 - 99 mg/dL   BUN 10 8 - 27 mg/dL   Creatinine, Ser 8.41 0.57 - 1.00 mg/dL   eGFR 70 >66 AY/TKZ/6.01   BUN/Creatinine Ratio 11 (L) 12 - 28   Sodium 142 134 - 144 mmol/L   Potassium 4.0 3.5 - 5.2 mmol/L   Chloride 102 96 - 106 mmol/L   CO2 23 20 - 29 mmol/L   Calcium 9.5 8.7 - 10.3 mg/dL   Total Protein 7.1 6.0 - 8.5 g/dL   Albumin 4.6 3.9 - 4.9 g/dL   Globulin, Total 2.5 1.5 - 4.5 g/dL   Albumin/Globulin Ratio 1.8 1.2 - 2.2   Bilirubin Total 0.4 0.0 - 1.2 mg/dL   Alkaline Phosphatase 66 44 - 121 IU/L   AST 26 0 - 40 IU/L   ALT 14 0 - 32 IU/L  Lipid Panel w/o Chol/HDL Ratio  Result Value Ref Range   Cholesterol, Total 175 100 - 199 mg/dL   Triglycerides 78 0 - 149 mg/dL   HDL 70 >09 mg/dL   VLDL Cholesterol Cal 14 5 - 40 mg/dL   LDL Chol Calc (NIH) 91 0 - 99 mg/dL  VITAMIN D 25 Hydroxy (Vit-D Deficiency, Fractures)  Result Value Ref Range   Vit D, 25-Hydroxy 44.0 30.0 - 100.0 ng/mL  TSH  Result Value Ref Range   TSH 2.640 0.450 - 4.500 uIU/mL      Assessment & Plan:   Problem List Items  Addressed This Visit       Genitourinary   Vaginal atrophy    Ongoing issue, prefers not to use hormone therapy.  Educated her on vaginal atrophy and causes.  Recommend she trial Replens OTC and highly recommend she use [place] Jelly every time she has intercourse to help prevent discomfort.  Could consider vaginal estrace in future if patient does not have benefit with OTC methods and changes mind on hormones.        Other   Chronic neck pain    Chronic, followed by neurosurgery and attending PT at this time.  Continue Gabapentin and Robaxin as currently ordered.  Suspect pain is cause of current headaches and insomnia -- she endorses pain make difficult to sleep.  Could consider stopping Trazodone in future and starting Duloxetine to take with her Gabapentin to help further with pain, which in turn may benefit sleep.      Relevant Medications   traZODone (DESYREL) 100 MG tablet   Insomnia - Primary    Chronic, ongoing.  No benefit from OTC medications.  Will increase Trazodone to 100 MG as needed at night.  Educated her on this -- use and side effects.  Discussed with her how to take and when.  Suspect pain is major reason for difficulty sleeping, which she endorses.  Refer to neck pain plan of care for further on this.  Night sweats    ?related to a medication or pain.  Discussed with her since this is new it is highly unlikely to be related to hormonal changes.  Will check CBC, CMP, TSH.      Relevant Orders   CBC with Differential/Platelet   Comprehensive metabolic panel   TSH     Follow up plan: Return for as scheduled in September.

## 2022-10-13 NOTE — Assessment & Plan Note (Signed)
Ongoing issue, prefers not to use hormone therapy.  Educated her on vaginal atrophy and causes.  Recommend she trial Replens OTC and highly recommend she use [place] Jelly every time she has intercourse to help prevent discomfort.  Could consider vaginal estrace in future if patient does not have benefit with OTC methods and changes mind on hormones.

## 2022-10-13 NOTE — Assessment & Plan Note (Signed)
Chronic, ongoing.  No benefit from OTC medications.  Will increase Trazodone to 100 MG as needed at night.  Educated her on this -- use and side effects.  Discussed with her how to take and when.  Suspect pain is major reason for difficulty sleeping, which she endorses.  Refer to neck pain plan of care for further on this.

## 2022-10-13 NOTE — Assessment & Plan Note (Signed)
?  related to a medication or pain.  Discussed with her since this is new it is highly unlikely to be related to hormonal changes.  Will check CBC, CMP, TSH.

## 2022-10-13 NOTE — Assessment & Plan Note (Signed)
Chronic, followed by neurosurgery and attending PT at this time.  Continue Gabapentin and Robaxin as currently ordered.  Suspect pain is cause of current headaches and insomnia -- she endorses pain make difficult to sleep.  Could consider stopping Trazodone in future and starting Duloxetine to take with her Gabapentin to help further with pain, which in turn may benefit sleep.

## 2022-10-14 ENCOUNTER — Other Ambulatory Visit: Payer: 59

## 2022-10-14 DIAGNOSIS — R61 Generalized hyperhidrosis: Secondary | ICD-10-CM | POA: Diagnosis not present

## 2022-10-15 ENCOUNTER — Ambulatory Visit: Payer: 59 | Attending: Neurosurgery | Admitting: Physical Therapy

## 2022-10-15 DIAGNOSIS — M542 Cervicalgia: Secondary | ICD-10-CM

## 2022-10-15 DIAGNOSIS — G8929 Other chronic pain: Secondary | ICD-10-CM | POA: Diagnosis not present

## 2022-10-15 DIAGNOSIS — M25511 Pain in right shoulder: Secondary | ICD-10-CM | POA: Diagnosis not present

## 2022-10-15 DIAGNOSIS — M6281 Muscle weakness (generalized): Secondary | ICD-10-CM | POA: Diagnosis not present

## 2022-10-15 NOTE — Progress Notes (Signed)
Contacted via MyChart   Good morning Danielle Stephens, your labs have returned and these look great.  I do not see any concerns on these.  We will monitor closely.  I think if we can get pain under control and get you some sleep, that you will start feeling better.:) Keep being amazing!!  Thank you for allowing me to participate in your care.  I appreciate you. Kindest regards, Sache Sane

## 2022-10-15 NOTE — Therapy (Signed)
OUTPATIENT PHYSICAL THERAPY TREATMENT   Patient Name: Danielle Stephens MRN: [id number] DOB:16-Jun-1950, 72 y.o., female Today's Date: 10/15/2022   PT End of Session - 10/19/22 1041     Visit Number 6    Number of Visits 13    Date for PT Re-Evaluation 11/05/22    Authorization Type [employer] Medicare 2024    Progress Note Due on Visit 10    PT Start Time 1348    PT Stop Time 1421    PT Time Calculation (min) 33 min    Activity Tolerance Patient limited by pain    Behavior During Therapy [facility] for tasks assessed/performed              Past Medical History:  Diagnosis Date   Anxiety    Chronic kidney disease    STAGE 3   Hypertension    Shoulder pain    Past Surgical History:  Procedure Laterality Date   CLOSED MANIPULATION SHOULDER WITH STERIOD INJECTION Right 08/28/2019   Procedure: MANIPULATION SHOULDER WITH STEROID INJECTION x2;  Surgeon: Signa Kell, MD;  Location: [facility];  Service: Orthopedics;  Laterality: Right;   COLONOSCOPY     COLONOSCOPY WITH PROPOFOL N/A 09/23/2021   Procedure: COLONOSCOPY WITH PROPOFOL;  Surgeon: Toney Reil, MD;  Location: [facility];  Service: Endoscopy;  Laterality: N/A;  ONE ASCENDING COLON POLYP NOT RETRIEVED   DILATION AND CURETTAGE, DIAGNOSTIC / THERAPEUTIC     POLYPECTOMY N/A 09/23/2021   Procedure: POLYPECTOMY;  Surgeon: Toney Reil, MD;  Location: [facility];  Service: Endoscopy;  Laterality: N/A;   REVERSE SHOULDER ARTHROPLASTY Right 05/20/2020   Procedure: Right reverse shoulder arthroplasty, biceps tenodesis;  Surgeon: Signa Kell, MD;  Location: [facility];  Service: Orthopedics;  Laterality: Right;   rotary cuff Right 03/06/2019   SHOULDER ARTHROSCOPY Right 08/28/2019   Procedure: Right shoulder arthroscopic capsular release, lysis of adhesion,;  Surgeon: Signa Kell, MD;  Location: [facility];  Service: Orthopedics;  Laterality: Right;   Patient Active Problem List   Diagnosis Date Noted   Chronic neck pain  10/13/2022   Vaginal atrophy 10/13/2022   Night sweats 10/13/2022   Left foot pain 08/07/2022   Cervical radicular pain 05/12/2022   Chronic left shoulder pain 12/23/2021   Chronic venous insufficiency 10/07/2021   Family history of colon cancer in mother    Polyp of ascending colon    Pain due to varicose veins of lower extremity 09/01/2021   Chronic pain syndrome 07/29/2021   Suprascapular entrapment neuropathy of right side 07/29/2021   Insomnia 07/23/2021   Raynaud's disease without gangrene 05/06/2021   Type B blood, Rh positive 05/04/2021   PAC (premature atrial contraction) 04/14/2021   Chronic right shoulder pain 12/23/2020   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/2020   Right rotator cuff tear arthropathy 12/23/2020   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   Osteopenia 02/11/2020   Vitamin D deficiency 08/18/2019   Hyperlipidemia, mixed 08/17/2019   H/O repair of right rotator cuff 04/10/2019   Benign essential HTN 03/19/2019    PCP: Marjie Skiff, NP  REFERRING PROVIDER: Venetia Night, MD  REFERRING DIAGNOSIS:  M54.2 (ICD-10-CM) - Cervicalgia  M43.12 (ICD-10-CM) - Spondylolisthesis of cervical region  M54.12 (ICD-10-CM) - Cervical radiculopathy    THERAPY DIAG: Cervicalgia  Chronic right shoulder pain  Muscle weakness (generalized)  RATIONALE FOR EVALUATION AND TREATMENT: Rehabilitation  ONSET DATE: March 2024  FOLLOW UP APPT WITH PROVIDER: Yes , 10/27/22  Pertinent History  Pt is a 72 year old female referred to physical therapy for neck pain. Per referring provider, pt has chronic right C6 radiculopathy, and anterolisthesis of C4 on C5 that meets the criteria for instability. She has significant pain with movement of her right arm around her shoulder. Pt has Hx of R RTC repair (4 years ago with Hx of frozen shoulder); pt ended up having rTSA for R shoulder due to ongoing pain/disability - pt has gone to pain management for her R shoulder  for 2-3 years. She reports pain along R arm down to her R forearm; she states it did go to her fingers, but that stopped after ESI. She has a small amount of tingling in her right hand. She states that pain in her neck started in March and has gotten worse. Pt reports pain along SCM that radiates into her R ear. Pt reports she is active and still does daily housework, walking, cleaning. Pt reports using Voltaren gel and Tylenol today and she feels this helped some. Pt reports using muscle relaxers and Gabapentin. Pt reports difficulty sleeping and being prescribed trazodone for sleep aid. Pt is substantially limited with reaching with her R arm. Difficulty with sleeping due to disturbance; pt reports having to toss and turn often; pain with R sidelying. Pt will be out of town 8/20-9/03. No diploplia, no dysphagia, dysarthria. No vertigo.    Pain:  Pain Intensity: Present: 10/10, Best: 5/10, Worst: 10/10 Pain location: R splenius cervicis and into levator region, radiates down to supraclavicular region, some pain into R periscapular region  Pain Quality: sharp  Radiating: Yes , into R arm  Numbness/Tingling: Yes, pt reports NT into neck and shoulder Focal Weakness: Hx of difficulty with gripping/holding items; epidural did help with this  Aggravating factors: cooking/cleaning/R upper limb activity, prolonged sitting, elevating R shoulder, looking up/down  Relieving factors: Rx medication (helps moderately), Voltaren gel 24-hour pain behavior: continuous pain regardless of time of day  History of prior neck injury, pain, surgery, or therapy: Yes; R RTA, hx ESI of C-spine Falls: Has patient fallen in last 6 months? No, Number of falls: N/A Follow-up appointment with MD: Yes f/u    10/27/22 Dominant hand: right Imaging: Yes ;   MRI C spine 03/25/2022 IMPRESSION: 1. Mild central canal stenosis and mild foraminal stenosis on the left at C4-5. 2. Right paracentral disc protrusion and spurring at C5-6  with cord flattening on the right.     Electronically Signed   By: Marlan Palau M.D.   On: 03/25/2022 11:43   Flexion-extension x-rays on March 30, 2022 show 3.6 mm of anterolisthesis of C4 on C5 in flexion that shows approximately 1 mm of retrolisthesis on extension.  -Per Dr. Myer Haff, anterolisthesis of C4 on C5 that meets the criteria for instability.   Prior level of function: Independent Occupational demands: [employer] Hobbies: crocheting  Red flags (personal history of cancer, h/o spinal tumors, history of compression fracture, chills/fever, night sweats, nausea, vomiting, unrelenting pain): Negative  Intermittent night sweats  Patient Goals: Pain relief; more use of R arm    OBJECTIVE:    Posture Forward head, mild rounded shoulders   AROM AROM (Normal range in degrees) AROM 09/22/2022  Cervical  Flexion (50) 32  Extension (80) 44  Right lateral flexion (45) 48*  Left lateral flexion (45) 38*  Right rotation (85) 62  Left rotation (85) 49  (* = pain; Blank rows = not tested)  R shoulder forward elevation AROM to 85 deg only,  L shoulder AROM grossly WNL   MMT MMT (out of 5) Right 09/22/2022 Left 09/22/2022  Cervical (isometric)  Flexion 4/5*  Extension 3+/5*  Lateral Flexion 3+/5* (R shoulder pain) WNL  Rotation WNL WNL      Shoulder   Flexion 2-/5  4/5*  Extension    Abduction 2-/5  3+/5*  Internal rotation    Horizontal abduction    External rotation  3+/5* 4-/5*  Lower Trapezius    Rhomboids        Elbow  Flexion 4+ 4+  Extension 4+ 4+  Pronation    Supination        (* = pain; Blank rows = not tested)    Palpation Location LEFT  RIGHT           Suboccipitals    Cervical paraspinals 1 2  Upper Trapezius 0 2  Levator Scapulae 0 1  Rhomboid Major/Minor 0 1  (Blank rows = not tested) Graded on 0-4 scale (0 = no pain, 1 = pain, 2 = pain with wincing/grimacing/flinching, 3 = pain with withdrawal, 4 = unwilling to allow  palpation), (Blank rows = not tested)  Repeated Movements Repeated cervical retraction in hooklying: increase, worse (increase in R upper trap/paracervical pain)  Passive Accessory Intervertebral Motion Deferred today     SPECIAL TESTS Spurlings A (ipsilateral lateral flexion/axial compression): R: Positive L: Negative Distraction Test: Positive for pain relief   Hoffman Sign (cervical cord compression): R: Negative L: Negative ULTT Median: R: Not examined L: Not examined ULTT Ulnar: R: Not examined L: Not examined ULTT Radial: R: Not examined L: Not examined     TODAY'S TREATMENT   SUBJECTIVE STATEMENT: Pt reports her condition has been "up and down" since her last follow-up with PT. She reports difficulty with getting to sleep with increase in dosage of Trazadone. Patient reports pain along thoracic spine yesterday that improved with use of lidocaine patch. Patient reports 3/10 pain affecting R upper trapezius region.     Manual Therapy - for symptom modulation, soft tissue sensitivity and mobility, joint mobility, ROM   Manual cervical traction, general; 10 sec on, 5 sec off; x 10 minutes STM/DTM R>L upper trapezius and C3-6 splenius cervicis/capitis x 15 minutes   *not today* Passive median nerve gliding with scapular depression; R upper limb with ULTT2A position; x20    Trigger Point Dry Needling (TDN) with electrical stimulation, dry needling unbilled Education performed with patient regarding potential benefits and risks of TDN at previous visit. Pt provided verbal consent to treatment. TDN performed to R levator scapulae and rhomboid major (rib block technique) with 0.25 x 40 mm needles with pistoning technique utilized. TDN performed to bilateral C4 and bilateral T2 multifidi with 0.25 x 40 needles kept in for electric stimulation utilizing E-Stim II unit. 7.5 minutes at 4 intensity with 5 pps frequency; 7.5 minutes at 4 intensity with 75 pps with microcurrent. Pt  demonstrates flexion-extension and R/L rotation [facility] following treatment today.      Therapeutic Exercise - for improved soft tissue flexibility and extensibility as needed for ROM, improved deep neck flexor strength as needed for stabilization of cervical column, isometrics for promoting stability and analgesic effect    *not today* Deep cervical flexor nod, supine; 1 x 10, 5 sec hold  -PT demo and verbal cueing for technique Self cervical traction; technique reviewed Cervical spine isometrics, flexion/extension and lateral flexion R/L; reviewed with brief performance of flexion and lateral flexion isometrics for example  PATIENT EDUCATION:  Education details: HEP, PT POC Person educated: Patient Education method: Explanation Education comprehension: verbalized understanding   HOME EXERCISE PROGRAM: Access Code: [email or web address] URL: [email or web address] Date: 09/24/2022 Prepared by: Consuela Mimes  Exercises - Standing Isometric Cervical Extension with Manual Resistance  - 2 x daily - 7 x weekly - 2 sets - 10 reps - Standing Isometric Cervical Flexion with Manual Resistance  - 2 x daily - 7 x weekly - 2 sets - 10 reps - Standing Isometric Cervical Sidebending with Manual Resistance  - 2 x daily - 7 x weekly - 2 sets - 10 reps - 5sec hold - Seated Self Cervical Traction  - 2 x daily - 7 x weekly - 5-10sec hold - Supine Deep Neck Flexor Nods  - 2 x daily - 7 x weekly - 2 sets - 10 reps - 5sec hold    ASSESSMENT:  CLINICAL IMPRESSION: Patient has responded well with use of dry needling + e-stim. She has relatively low NPRS at arrival today, though her symptoms have been significantly "up and down" over the previous week. Pt has demonstrated marked improvement in cervical spine AROM tolerated. Her condition is complicated by chronic R shoulder pain following failed RCR and pt having to undergo rTSA. We will integrate shoulder AAROM as pain control improves. Patient  has remaining deficits in cervical spine and R shoulder ROM, R paracervical mm and UT/periscapular pain, decreased C-spine mobility. Patient will benefit from continued skilled therapeutic intervention to address the above deficits as needed for improved function and QoL.    REHAB POTENTIAL: Fair given evidence of cervical spine instability, complicated orthopedic Hx of R shoulder  CLINICAL DECISION MAKING: Unstable/unpredictable  EVALUATION COMPLEXITY: High   GOALS: Goals reviewed with patient? Yes  SHORT TERM GOALS: Target date: 10/13/2022  Pt will be independent with HEP to improve strength and decrease neck pain to improve pain-free function at home and work. Baseline: 09/22/22: Baseline HEP initiated  Goal status: INITIAL   LONG TERM GOALS: Target date: 11/03/2022  Pt will increase FOTO to at least 55 to demonstrate significant improvement in function at home and work related to neck pain  Baseline: 09/22/22: 35 Goal status: INITIAL  2.  Pt will decrease worst neck pain by at least 2 points on the NPRS in order to demonstrate clinically significant reduction in neck pain. Baseline: 09/22/22: Pain 10/10 at worst  Goal status: INITIAL  3.  Pt will have cervical spine flexion/extension WFL and rotation to 60 deg or greater for both directions without reproduction of symptoms as needed for scanning environment and completing driving and overhead activity Baseline: 09/22/22: Motion loss with flexion, extension, L rotation  Goal status: INITIAL  4.  Pt will tolerate forward elevation of R shoulder up to 120 deg without reproduction of R upper quarter pain as needed for reaching, self-care tasks, household chores Baseline: 09/22/22: R shoulder flexion AROM up to 85 deg, pain in upper trap/upper arm.  Goal status: INITIAL   PLAN: PT FREQUENCY: 2x/week  PT DURATION: 6 weeks  PLANNED INTERVENTIONS: Therapeutic exercises, Therapeutic activity, Neuromuscular re-education, Balance training,  Gait training, Patient/Family education, Joint manipulation, Joint mobilization, Dry Needling, Electrical stimulation, Spinal mobilization, Cryotherapy, Moist heat, Taping, Traction, and Manual therapy  PLAN FOR NEXT SESSION: Manual traction and STM for symptom modulation. Continue with isometric and re-training for deep neck flexors/stabilizers. MET to improve ROM. Consider use of e-stim and DN for symptom modulation prn. Progress with postural work prn.    Riki Rusk  Vonita Moss, PT, DPT #[id number]  Gertie Exon 10/19/2022, 10:43 AM

## 2022-10-19 ENCOUNTER — Encounter: Payer: Self-pay | Admitting: Physical Therapy

## 2022-10-20 ENCOUNTER — Ambulatory Visit: Payer: 59 | Admitting: Physical Therapy

## 2022-10-20 DIAGNOSIS — M542 Cervicalgia: Secondary | ICD-10-CM | POA: Diagnosis not present

## 2022-10-20 DIAGNOSIS — M6281 Muscle weakness (generalized): Secondary | ICD-10-CM

## 2022-10-20 DIAGNOSIS — G8929 Other chronic pain: Secondary | ICD-10-CM

## 2022-10-20 DIAGNOSIS — M25511 Pain in right shoulder: Secondary | ICD-10-CM | POA: Diagnosis not present

## 2022-10-20 NOTE — Therapy (Unsigned)
OUTPATIENT PHYSICAL THERAPY TREATMENT   Patient Name: Danielle Stephens MRN: [id number] DOB:08-Dec-1950, 72 y.o., female Today's Date: 10/20/2022   PT End of Session - 10/20/22 1348     Visit Number 7    Number of Visits 13    Date for PT Re-Evaluation 11/05/22    Authorization Type [employer] Medicare 2024    Progress Note Due on Visit 10    PT Start Time 1347    PT Stop Time 1425    PT Time Calculation (min) 38 min    Activity Tolerance Patient limited by pain    Behavior During Therapy [facility] for tasks assessed/performed              Past Medical History:  Diagnosis Date   Anxiety    Chronic kidney disease    STAGE 3   Hypertension    Shoulder pain    Past Surgical History:  Procedure Laterality Date   CLOSED MANIPULATION SHOULDER WITH STERIOD INJECTION Right 08/28/2019   Procedure: MANIPULATION SHOULDER WITH STEROID INJECTION x2;  Surgeon: Signa Kell, MD;  Location: [facility];  Service: Orthopedics;  Laterality: Right;   COLONOSCOPY     COLONOSCOPY WITH PROPOFOL N/A 09/23/2021   Procedure: COLONOSCOPY WITH PROPOFOL;  Surgeon: Toney Reil, MD;  Location: [facility];  Service: Endoscopy;  Laterality: N/A;  ONE ASCENDING COLON POLYP NOT RETRIEVED   DILATION AND CURETTAGE, DIAGNOSTIC / THERAPEUTIC     POLYPECTOMY N/A 09/23/2021   Procedure: POLYPECTOMY;  Surgeon: Toney Reil, MD;  Location: [facility];  Service: Endoscopy;  Laterality: N/A;   REVERSE SHOULDER ARTHROPLASTY Right 05/20/2020   Procedure: Right reverse shoulder arthroplasty, biceps tenodesis;  Surgeon: Signa Kell, MD;  Location: [facility];  Service: Orthopedics;  Laterality: Right;   rotary cuff Right 03/06/2019   SHOULDER ARTHROSCOPY Right 08/28/2019   Procedure: Right shoulder arthroscopic capsular release, lysis of adhesion,;  Surgeon: Signa Kell, MD;  Location: [facility];  Service: Orthopedics;  Laterality: Right;   Patient Active Problem List   Diagnosis Date Noted   Chronic neck pain  10/13/2022   Vaginal atrophy 10/13/2022   Night sweats 10/13/2022   Left foot pain 08/07/2022   Cervical radicular pain 05/12/2022   Chronic left shoulder pain 12/23/2021   Chronic venous insufficiency 10/07/2021   Family history of colon cancer in mother    Polyp of ascending colon    Pain due to varicose veins of lower extremity 09/01/2021   Chronic pain syndrome 07/29/2021   Suprascapular entrapment neuropathy of right side 07/29/2021   Insomnia 07/23/2021   Raynaud's disease without gangrene 05/06/2021   Type B blood, Rh positive 05/04/2021   PAC (premature atrial contraction) 04/14/2021   Chronic right shoulder pain 12/23/2020   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/2020   Right rotator cuff tear arthropathy 12/23/2020   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   Osteopenia 02/11/2020   Vitamin D deficiency 08/18/2019   Hyperlipidemia, mixed 08/17/2019   H/O repair of right rotator cuff 04/10/2019   Benign essential HTN 03/19/2019    PCP: Marjie Skiff, NP  REFERRING PROVIDER: Venetia Night, MD  REFERRING DIAGNOSIS:  M54.2 (ICD-10-CM) - Cervicalgia  M43.12 (ICD-10-CM) - Spondylolisthesis of cervical region  M54.12 (ICD-10-CM) - Cervical radiculopathy    THERAPY DIAG: Cervicalgia  Chronic right shoulder pain  Muscle weakness (generalized)  RATIONALE FOR EVALUATION AND TREATMENT: Rehabilitation  ONSET DATE: March 2024  FOLLOW UP APPT WITH PROVIDER: Yes , 10/27/22  Pertinent History  Pt is a 72 year old female referred to physical therapy for neck pain. Per referring provider, pt has chronic right C6 radiculopathy, and anterolisthesis of C4 on C5 that meets the criteria for instability. She has significant pain with movement of her right arm around her shoulder. Pt has Hx of R RTC repair (4 years ago with Hx of frozen shoulder); pt ended up having rTSA for R shoulder due to ongoing pain/disability - pt has gone to pain management for her R shoulder  for 2-3 years. She reports pain along R arm down to her R forearm; she states it did go to her fingers, but that stopped after ESI. She has a small amount of tingling in her right hand. She states that pain in her neck started in March and has gotten worse. Pt reports pain along SCM that radiates into her R ear. Pt reports she is active and still does daily housework, walking, cleaning. Pt reports using Voltaren gel and Tylenol today and she feels this helped some. Pt reports using muscle relaxers and Gabapentin. Pt reports difficulty sleeping and being prescribed trazodone for sleep aid. Pt is substantially limited with reaching with her R arm. Difficulty with sleeping due to disturbance; pt reports having to toss and turn often; pain with R sidelying. Pt will be out of town 8/20-9/03. No diploplia, no dysphagia, dysarthria. No vertigo.    Pain:  Pain Intensity: Present: 10/10, Best: 5/10, Worst: 10/10 Pain location: R splenius cervicis and into levator region, radiates down to supraclavicular region, some pain into R periscapular region  Pain Quality: sharp  Radiating: Yes , into R arm  Numbness/Tingling: Yes, pt reports NT into neck and shoulder Focal Weakness: Hx of difficulty with gripping/holding items; epidural did help with this  Aggravating factors: cooking/cleaning/R upper limb activity, prolonged sitting, elevating R shoulder, looking up/down  Relieving factors: Rx medication (helps moderately), Voltaren gel 24-hour pain behavior: continuous pain regardless of time of day  History of prior neck injury, pain, surgery, or therapy: Yes; R RTA, hx ESI of C-spine Falls: Has patient fallen in last 6 months? No, Number of falls: N/A Follow-up appointment with MD: Yes f/u    10/27/22 Dominant hand: right Imaging: Yes ;   MRI C spine 03/25/2022 IMPRESSION: 1. Mild central canal stenosis and mild foraminal stenosis on the left at C4-5. 2. Right paracentral disc protrusion and spurring at C5-6  with cord flattening on the right.     Electronically Signed   By: Marlan Palau M.D.   On: 03/25/2022 11:43   Flexion-extension x-rays on March 30, 2022 show 3.6 mm of anterolisthesis of C4 on C5 in flexion that shows approximately 1 mm of retrolisthesis on extension.  -Per Dr. Myer Haff, anterolisthesis of C4 on C5 that meets the criteria for instability.   Prior level of function: Independent Occupational demands: [employer] Hobbies: crocheting  Red flags (personal history of cancer, h/o spinal tumors, history of compression fracture, chills/fever, night sweats, nausea, vomiting, unrelenting pain): Negative  Intermittent night sweats  Patient Goals: Pain relief; more use of R arm    OBJECTIVE:    Posture Forward head, mild rounded shoulders   AROM AROM (Normal range in degrees) AROM 09/22/2022  Cervical  Flexion (50) 32  Extension (80) 44  Right lateral flexion (45) 48*  Left lateral flexion (45) 38*  Right rotation (85) 62  Left rotation (85) 49  (* = pain; Blank rows = not tested)  R shoulder forward elevation AROM to 85 deg only,  L shoulder AROM grossly WNL   MMT MMT (out of 5) Right 09/22/2022 Left 09/22/2022  Cervical (isometric)  Flexion 4/5*  Extension 3+/5*  Lateral Flexion 3+/5* (R shoulder pain) WNL  Rotation WNL WNL      Shoulder   Flexion 2-/5  4/5*  Extension    Abduction 2-/5  3+/5*  Internal rotation    Horizontal abduction    External rotation  3+/5* 4-/5*  Lower Trapezius    Rhomboids        Elbow  Flexion 4+ 4+  Extension 4+ 4+  Pronation    Supination        (* = pain; Blank rows = not tested)    Palpation Location LEFT  RIGHT           Suboccipitals    Cervical paraspinals 1 2  Upper Trapezius 0 2  Levator Scapulae 0 1  Rhomboid Major/Minor 0 1  (Blank rows = not tested) Graded on 0-4 scale (0 = no pain, 1 = pain, 2 = pain with wincing/grimacing/flinching, 3 = pain with withdrawal, 4 = unwilling to allow  palpation), (Blank rows = not tested)  Repeated Movements Repeated cervical retraction in hooklying: increase, worse (increase in R upper trap/paracervical pain)  Passive Accessory Intervertebral Motion Deferred today     SPECIAL TESTS Spurlings A (ipsilateral lateral flexion/axial compression): R: Positive L: Negative Distraction Test: Positive for pain relief   Hoffman Sign (cervical cord compression): R: Negative L: Negative ULTT Median: R: Not examined L: Not examined ULTT Ulnar: R: Not examined L: Not examined ULTT Radial: R: Not examined L: Not examined     TODAY'S TREATMENT   SUBJECTIVE STATEMENT: Pt reports having pain the day after her last f/u with pain affecting R side of neck and down her R upper limb. She report some specific point tenderness along L proximal wrist extensors. Patient reports she is not sleeping well with new Tizanadine and she reports notable pulling affecting R paracervical region. Patient reports 10/10 pain Saturday. Pt reports difficulty participating with her HEP due to ongoing pain. Pain rating scale: 7/10 pain at arrival.     AROM Cervical flexion:  WNL* Cervical extension: min motion loss Lateral flexion: Right WNL* , Left WNL* Cervical rotation: Right 75%*, Left WNL* *Indicates pain    Manual Therapy - for symptom modulation, soft tissue sensitivity and mobility, joint mobility, ROM   Manual cervical traction, general; 10 sec on, 5 sec off; x 10 minutes STM/DTM R>L upper trapezius and C3-6 splenius cervicis/capitis, bilat levator scapulae and L middle trapezius; x 15 minutes    *not today* Passive median nerve gliding with scapular depression; R upper limb with ULTT2A position; x20 Trigger Point Dry Needling (TDN) with electrical stimulation, dry needling unbilled Education performed with patient regarding potential benefits and risks of TDN at previous visit. Pt provided verbal consent to treatment. TDN performed to R levator  scapulae and rhomboid major (rib block technique) with 0.25 x 40 mm needles with pistoning technique utilized. TDN performed to bilateral C4 and bilateral T2 multifidi with 0.25 x 40 needles kept in for electric stimulation utilizing E-Stim II unit. 7.5 minutes at 4 intensity with 5 pps frequency; 7.5 minutes at 4 intensity with 75 pps with microcurrent. Pt demonstrates flexion-extension and R/L rotation [facility] following treatment today.      Therapeutic Exercise - for improved soft tissue flexibility and extensibility as needed for ROM, improved deep neck flexor strength as needed for stabilization of cervical column, isometrics  for promoting stability and analgesic effect  Levator scapulae stretch; 2 x 30 sec bilat Scapular retraction, 2 x10; 5 sec hold  Deep cervical flexor nod, supine; 1 x 10, 10 sec hold  -PT demo and verbal cueing for technique  *not today* Self cervical traction; technique reviewed Cervical spine isometrics, flexion/extension and lateral flexion R/L; reviewed with brief performance of flexion and lateral flexion isometrics for example    PATIENT EDUCATION:  Education details: HEP, PT POC Person educated: Patient Education method: Explanation Education comprehension: verbalized understanding   HOME EXERCISE PROGRAM: Access Code: [email or web address] URL: [email or web address] Date: 09/24/2022 Prepared by: Consuela Mimes  Exercises - Standing Isometric Cervical Extension with Manual Resistance  - 2 x daily - 7 x weekly - 2 sets - 10 reps - Standing Isometric Cervical Flexion with Manual Resistance  - 2 x daily - 7 x weekly - 2 sets - 10 reps - Standing Isometric Cervical Sidebending with Manual Resistance  - 2 x daily - 7 x weekly - 2 sets - 10 reps - 5sec hold - Seated Self Cervical Traction  - 2 x daily - 7 x weekly - 5-10sec hold - Supine Deep Neck Flexor Nods  - 2 x daily - 7 x weekly - 2 sets - 10 reps - 5sec hold    ASSESSMENT:  CLINICAL  IMPRESSION: Patient unfortunately has experienced severe pain between sessions with most pain over the weekend between Saturday to Sunday. DN with e-stim does help with symptom modulation significantly, but effects are short-term and pt does have marked flare-ups when going about her ADLs outside of clinic. She does demonstrate mostly normal cervical spine AROM with exception of mild extension motion loss (lack of lower cervical extension) and limited R rotation; pt does have pain with most planes of motion. Pt does have relief with use of traction today, and we modestly progressed program to include periscapular isometrics and longer-duration deep cervical flexor isometrics. Patient has remaining deficits in cervical spine and R shoulder ROM, R paracervical mm and UT/periscapular pain, decreased C-spine mobility. Patient will benefit from continued skilled therapeutic intervention to address the above deficits as needed for improved function and QoL.    REHAB POTENTIAL: Fair given evidence of cervical spine instability, complicated orthopedic Hx of R shoulder  CLINICAL DECISION MAKING: Unstable/unpredictable  EVALUATION COMPLEXITY: High   GOALS: Goals reviewed with patient? Yes  SHORT TERM GOALS: Target date: 10/13/2022  Pt will be independent with HEP to improve strength and decrease neck pain to improve pain-free function at home and work. Baseline: 09/22/22: Baseline HEP initiated  Goal status: INITIAL   LONG TERM GOALS: Target date: 11/03/2022  Pt will increase FOTO to at least 55 to demonstrate significant improvement in function at home and work related to neck pain  Baseline: 09/22/22: 35 Goal status: INITIAL  2.  Pt will decrease worst neck pain by at least 2 points on the NPRS in order to demonstrate clinically significant reduction in neck pain. Baseline: 09/22/22: Pain 10/10 at worst  Goal status: INITIAL  3.  Pt will have cervical spine flexion/extension WFL and rotation to 60 deg  or greater for both directions without reproduction of symptoms as needed for scanning environment and completing driving and overhead activity Baseline: 09/22/22: Motion loss with flexion, extension, L rotation  Goal status: INITIAL  4.  Pt will tolerate forward elevation of R shoulder up to 120 deg without reproduction of R upper quarter pain as needed for reaching, self-care tasks, household chores Baseline:  09/22/22: R shoulder flexion AROM up to 85 deg, pain in upper trap/upper arm.  Goal status: INITIAL   PLAN: PT FREQUENCY: 2x/week  PT DURATION: 6 weeks  PLANNED INTERVENTIONS: Therapeutic exercises, Therapeutic activity, Neuromuscular re-education, Balance training, Gait training, Patient/Family education, Joint manipulation, Joint mobilization, Dry Needling, Electrical stimulation, Spinal mobilization, Cryotherapy, Moist heat, Taping, Traction, and Manual therapy  PLAN FOR NEXT SESSION: Manual traction and STM for symptom modulation. Continue with isometric and re-training for deep neck flexors/stabilizers. MET to improve ROM. Consider use of e-stim and DN for symptom modulation prn. Progress with postural work prn.    Consuela Mimes, PT, DPT #[id number]  Gertie Exon 10/20/2022, 1:49 PM

## 2022-10-22 ENCOUNTER — Encounter: Payer: Self-pay | Admitting: Physical Therapy

## 2022-10-22 ENCOUNTER — Ambulatory Visit: Payer: 59 | Admitting: Physical Therapy

## 2022-10-27 ENCOUNTER — Ambulatory Visit: Payer: 59 | Admitting: Physical Therapy

## 2022-10-27 ENCOUNTER — Encounter: Payer: Self-pay | Admitting: Neurosurgery

## 2022-10-27 ENCOUNTER — Ambulatory Visit (INDEPENDENT_AMBULATORY_CARE_PROVIDER_SITE_OTHER): Payer: 59 | Admitting: Neurosurgery

## 2022-10-27 ENCOUNTER — Encounter: Payer: Self-pay | Admitting: Physical Therapy

## 2022-10-27 VITALS — BP 110/60 | Ht 62.0 in | Wt 112.0 lb

## 2022-10-27 DIAGNOSIS — M542 Cervicalgia: Secondary | ICD-10-CM

## 2022-10-27 DIAGNOSIS — M532X2 Spinal instabilities, cervical region: Secondary | ICD-10-CM

## 2022-10-27 DIAGNOSIS — G8929 Other chronic pain: Secondary | ICD-10-CM | POA: Diagnosis not present

## 2022-10-27 DIAGNOSIS — M25511 Pain in right shoulder: Secondary | ICD-10-CM

## 2022-10-27 DIAGNOSIS — M4312 Spondylolisthesis, cervical region: Secondary | ICD-10-CM | POA: Diagnosis not present

## 2022-10-27 DIAGNOSIS — M6281 Muscle weakness (generalized): Secondary | ICD-10-CM | POA: Diagnosis not present

## 2022-10-27 DIAGNOSIS — M5412 Radiculopathy, cervical region: Secondary | ICD-10-CM

## 2022-10-27 NOTE — Progress Notes (Signed)
Referring Physician:  Marjie Skiff, NP [address] [place],  [place] [address]  Primary Physician:  Marjie Skiff, NP  History of Present Illness: 10/27/2022 Danielle Stephens is doing mildly better.  She still having lots of pain around her right shoulder.  She has had some pain between her left shoulder blade and inside of her left elbow.  It is not associated with movement.  09/01/2022 Danielle Stephens is here today with a chief complaint of neck and back pain.  She also has pain in the right arm.  She had several shoulder procedures over the past 2 years.  She has significant pain with movement of her right arm around her shoulder.  She has a small amount of tingling in her right hand.  She has significant neck pain and tightness.  This pain has been worsening over the past couple of years.  She is also suffering from left leg pain.  It is currently not bothering her too much, but she can have as bad as 7 out of 10 discomfort in her left leg.  She has responded well to epidural steroid injections in the past.  Using her arm makes her neck pain worse.  Flexing her neck also makes it worse.  Bowel/Bladder Dysfunction: none  Conservative measures: EMG 05/14/22 Physical therapy:  denies Multimodal medical therapy including regular antiinflammatories: naproxen, trazodone  Injections:  epidural steroid injections  Right cervical  C7- T1 epidural steroid injection on 05/25/2022 that provided her with 50% pain relief for about 3 weeks.   Right shoulder ESIS: 01/21/22, 10/08/21,08/06/21,01/06/22  Past Surgery:   denies    Danielle Stephens has no symptoms of cervical myelopathy.  The symptoms are causing a significant impact on the patient's life.   I have utilized the care everywhere function in epic to review the outside records available from external health systems.  Review of Systems:  A 10 point review of systems is negative, except for the pertinent positives and negatives detailed in the  HPI.  Past Medical History: Past Medical History:  Diagnosis Date   Anxiety    Chronic kidney disease    STAGE 3   Hypertension    Shoulder pain     Past Surgical History: Past Surgical History:  Procedure Laterality Date   CLOSED MANIPULATION SHOULDER WITH STERIOD INJECTION Right 08/28/2019   Procedure: MANIPULATION SHOULDER WITH STEROID INJECTION x2;  Surgeon: Signa Kell, MD;  Location: [facility];  Service: Orthopedics;  Laterality: Right;   COLONOSCOPY     COLONOSCOPY WITH PROPOFOL N/A 09/23/2021   Procedure: COLONOSCOPY WITH PROPOFOL;  Surgeon: Toney Reil, MD;  Location: [facility];  Service: Endoscopy;  Laterality: N/A;  ONE ASCENDING COLON POLYP NOT RETRIEVED   DILATION AND CURETTAGE, DIAGNOSTIC / THERAPEUTIC     POLYPECTOMY N/A 09/23/2021   Procedure: POLYPECTOMY;  Surgeon: Toney Reil, MD;  Location: [facility];  Service: Endoscopy;  Laterality: N/A;   REVERSE SHOULDER ARTHROPLASTY Right 05/20/2020   Procedure: Right reverse shoulder arthroplasty, biceps tenodesis;  Surgeon: Signa Kell, MD;  Location: [facility];  Service: Orthopedics;  Laterality: Right;   rotary cuff Right 03/06/2019   SHOULDER ARTHROSCOPY Right 08/28/2019   Procedure: Right shoulder arthroscopic capsular release, lysis of adhesion,;  Surgeon: Signa Kell, MD;  Location: [facility];  Service: Orthopedics;  Laterality: Right;    Allergies: Allergies as of 10/27/2022 - Review Complete 10/27/2022  Allergen Reaction Noted   Lactose intolerance (gi) Diarrhea 07/23/2021  Codeine Rash 03/24/2017    Medications:  Current Outpatient Medications:    amLODipine (NORVASC) 2.5 MG tablet, Take 1 tablet (2.5 mg total) by mouth daily., Disp: 90 tablet, Rfl: 4   Cholecalciferol (VITAMIN D) 50 MCG (2000 UT) CAPS, Take 2,000 Units by mouth daily. gummie, Disp: , Rfl:    gabapentin (NEURONTIN) 300 MG capsule, Take 1 capsule (300 mg total) by mouth at bedtime., Disp: 90 capsule, Rfl: 2    lactase (LACTAID) 3000 units tablet, Take 1 tablet by mouth 3 (three) times daily with meals., Disp: , Rfl:    lisinopril (ZESTRIL) 20 MG tablet, TAKE 1 TABLET BY MOUTH ONCE DAILY, Disp: 90 tablet, Rfl: 4   methocarbamol (ROBAXIN) 500 MG tablet, Take 1 tablet (500 mg total) by mouth every 8 (eight) hours as needed for muscle spasms., Disp: 90 tablet, Rfl: 1   Multiple Vitamin (MULTIVITAMIN) capsule, Take 1 capsule by mouth daily., Disp: , Rfl:    naproxen (NAPROSYN) 500 MG tablet, Take 1 tablet (500 mg total) by mouth 2 (two) times daily as needed., Disp: 30 tablet, Rfl: 0   rosuvastatin (CRESTOR) 20 MG tablet, Take 1 tablet (20 mg total) by mouth daily., Disp: 90 tablet, Rfl: 4   traZODone (DESYREL) 100 MG tablet, Take 1 tablet (100 mg total) by mouth at bedtime as needed for sleep., Disp: 90 tablet, Rfl: 4  Social History: Social History   Tobacco Use   Smoking status: Never   Smokeless tobacco: Never  Vaping Use   Vaping status: Never Used  Substance Use Topics   Alcohol use: No   Drug use: No    Family Medical History: Family History  Problem Relation Age of Onset   Hypertension Mother    Cancer Mother    Hypertension Father    Heart attack Father    Stroke Sister    Hypertension Sister    Heart attack Brother    Hypertension Sister    Hypertension Sister    Hypertension Sister    Hypertension Sister    Hypertension Sister    Down syndrome Brother    Breast cancer Neg Hx     Physical Examination: Vitals:   10/27/22 1318  BP: 110/60    General: Patient is in no apparent distress. Attention to examination is appropriate.  Neck:   Supple.  Full range of motion with discomfort.  Respiratory: Patient is breathing without any difficulty.   NEUROLOGICAL:     Awake, alert, oriented to person, place, and time.  Speech is clear and fluent.   Cranial Nerves: Pupils equal round and reactive to light.  Facial tone is symmetric.  Facial sensation is symmetric. Shoulder  shrug is symmetric. Tongue protrusion is midline.  There is no pronator drift.  Strength: Side Biceps Triceps Deltoid Interossei Grip Wrist Ext. Wrist Flex.  R 5 5 4+ 5 5 5 5   L 5 5 5 5 5 5 5    Side Iliopsoas Quads Hamstring PF DF EHL  R 5 5 5 5 5 5   L 5 5 5 5 5 5    Reflexes are 1+ and symmetric at the biceps, triceps, brachioradialis, patella and achilles.   Hoffman's is absent.   Bilateral upper and lower extremity sensation is intact to light touch.    No evidence of dysmetria noted.  Gait is normal.     Medical Decision Making  Imaging: MRI C spine 03/25/2022 IMPRESSION: 1. Mild central canal stenosis and mild foraminal stenosis on the left at C4-5.  2. Right paracentral disc protrusion and spurring at C5-6 with cord flattening on the right.     Electronically Signed   By: Marlan Palau M.D.   On: 03/25/2022 11:43  Flexion-extension x-rays on March 30, 2022 show 3.6 mm of anterolisthesis of C4 on C5 in flexion that shows approximately 1 mm of retrolisthesis on extension.  I have personally reviewed the images and agree with the above interpretation.  Assessment and Plan: Danielle Stephens is a pleasant 72 y.o. 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  She also has significant right shoulder pain due to her orthopedic concerns there.  I am not convinced that she will have good relief from a surgical intervention, but I do think she meets criteria for consideration of C4-6 anterior cervical discectomy and fusion.  I have asked her to think about this carefully.  I think her surgery would be most likely to improve her neck pain.  I would like her to think about how much her neck pain is impacting her life, and she will let me know if she would like to move forward with surgical consideration.   I spent a total of 10 minutes in this patient's care today. This time was spent reviewing pertinent  records including imaging studies, obtaining and confirming history, performing a directed evaluation, formulating and discussing my recommendations, and documenting the visit within the medical record.    Thank you for involving me in the care of this patient.       K. Myer Haff MD, [facility]

## 2022-10-27 NOTE — Therapy (Unsigned)
OUTPATIENT PHYSICAL THERAPY TREATMENT   Patient Name: KATIANNE FENCL MRN: [id number] DOB:11/29/1950, 72 y.o., female Today's Date: 10/27/2022   PT End of Session - 10/27/22 1040     Visit Number 8    Number of Visits 13    Date for PT Re-Evaluation 11/05/22    Authorization Type [employer] Medicare 2024    Progress Note Due on Visit 10    PT Start Time 1036    PT Stop Time 1115    PT Time Calculation (min) 39 min    Activity Tolerance Patient limited by pain    Behavior During Therapy [employer] for tasks assessed/performed             Past Medical History:  Diagnosis Date   Anxiety    Chronic kidney disease    STAGE 3   Hypertension    Shoulder pain    Past Surgical History:  Procedure Laterality Date   CLOSED MANIPULATION SHOULDER WITH STERIOD INJECTION Right 08/28/2019   Procedure: MANIPULATION SHOULDER WITH STEROID INJECTION x2;  Surgeon: Signa Kell, MD;  Location: [facility];  Service: Orthopedics;  Laterality: Right;   COLONOSCOPY     COLONOSCOPY WITH PROPOFOL N/A 09/23/2021   Procedure: COLONOSCOPY WITH PROPOFOL;  Surgeon: Toney Reil, MD;  Location: [facility];  Service: Endoscopy;  Laterality: N/A;  ONE ASCENDING COLON POLYP NOT RETRIEVED   DILATION AND CURETTAGE, DIAGNOSTIC / THERAPEUTIC     POLYPECTOMY N/A 09/23/2021   Procedure: POLYPECTOMY;  Surgeon: Toney Reil, MD;  Location: [facility];  Service: Endoscopy;  Laterality: N/A;   REVERSE SHOULDER ARTHROPLASTY Right 05/20/2020   Procedure: Right reverse shoulder arthroplasty, biceps tenodesis;  Surgeon: Signa Kell, MD;  Location: [facility];  Service: Orthopedics;  Laterality: Right;   rotary cuff Right 03/06/2019   SHOULDER ARTHROSCOPY Right 08/28/2019   Procedure: Right shoulder arthroscopic capsular release, lysis of adhesion,;  Surgeon: Signa Kell, MD;  Location: [facility];  Service: Orthopedics;  Laterality: Right;   Patient Active Problem List   Diagnosis Date Noted   Chronic neck pain  10/13/2022   Vaginal atrophy 10/13/2022   Night sweats 10/13/2022   Left foot pain 08/07/2022   Cervical radicular pain 05/12/2022   Chronic left shoulder pain 12/23/2021   Chronic venous insufficiency 10/07/2021   Family history of colon cancer in mother    Polyp of ascending colon    Pain due to varicose veins of lower extremity 09/01/2021   Chronic pain syndrome 07/29/2021   Suprascapular entrapment neuropathy of right side 07/29/2021   Insomnia 07/23/2021   Raynaud's disease without gangrene 05/06/2021   Type B blood, Rh positive 05/04/2021   PAC (premature atrial contraction) 04/14/2021   Chronic right shoulder pain 12/23/2020   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/2020   Right rotator cuff tear arthropathy 12/23/2020   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   Osteopenia 02/11/2020   Vitamin D deficiency 08/18/2019   Hyperlipidemia, mixed 08/17/2019   H/O repair of right rotator cuff 04/10/2019   Benign essential HTN 03/19/2019    PCP: Marjie Skiff, NP  REFERRING PROVIDER: Venetia Night, MD  REFERRING DIAGNOSIS:  M54.2 (ICD-10-CM) - Cervicalgia  M43.12 (ICD-10-CM) - Spondylolisthesis of cervical region  M54.12 (ICD-10-CM) - Cervical radiculopathy    THERAPY DIAG: Cervicalgia  Chronic right shoulder pain  Muscle weakness (generalized)  RATIONALE FOR EVALUATION AND TREATMENT: Rehabilitation  ONSET DATE: March 2024  FOLLOW UP APPT WITH PROVIDER: Yes , 10/27/22  Pertinent History Pt  is a 72 year old female referred to physical therapy for neck pain. Per referring provider, pt has chronic right C6 radiculopathy, and anterolisthesis of C4 on C5 that meets the criteria for instability. She has significant pain with movement of her right arm around her shoulder. Pt has Hx of R RTC repair (4 years ago with Hx of frozen shoulder); pt ended up having rTSA for R shoulder due to ongoing pain/disability - pt has gone to pain management for her R shoulder  for 2-3 years. She reports pain along R arm down to her R forearm; she states it did go to her fingers, but that stopped after ESI. She has a small amount of tingling in her right hand. She states that pain in her neck started in March and has gotten worse. Pt reports pain along SCM that radiates into her R ear. Pt reports she is active and still does daily housework, walking, cleaning. Pt reports using Voltaren gel and Tylenol today and she feels this helped some. Pt reports using muscle relaxers and Gabapentin. Pt reports difficulty sleeping and being prescribed trazodone for sleep aid. Pt is substantially limited with reaching with her R arm. Difficulty with sleeping due to disturbance; pt reports having to toss and turn often; pain with R sidelying. Pt will be out of town 8/20-9/03. No diploplia, no dysphagia, dysarthria. No vertigo.    Pain:  Pain Intensity: Present: 10/10, Best: 5/10, Worst: 10/10 Pain location: R splenius cervicis and into levator region, radiates down to supraclavicular region, some pain into R periscapular region  Pain Quality: sharp  Radiating: Yes , into R arm  Numbness/Tingling: Yes, pt reports NT into neck and shoulder Focal Weakness: Hx of difficulty with gripping/holding items; epidural did help with this  Aggravating factors: cooking/cleaning/R upper limb activity, prolonged sitting, elevating R shoulder, looking up/down  Relieving factors: Rx medication (helps moderately), Voltaren gel 24-hour pain behavior: continuous pain regardless of time of day  History of prior neck injury, pain, surgery, or therapy: Yes; R RTA, hx ESI of C-spine Falls: Has patient fallen in last 6 months? No, Number of falls: N/A Follow-up appointment with MD: Yes f/u    10/27/22 Dominant hand: right Imaging: Yes ;   MRI C spine 03/25/2022 IMPRESSION: 1. Mild central canal stenosis and mild foraminal stenosis on the left at C4-5. 2. Right paracentral disc protrusion and spurring at C5-6  with cord flattening on the right.     Electronically Signed   By: Marlan Palau M.D.   On: 03/25/2022 11:43   Flexion-extension x-rays on March 30, 2022 show 3.6 mm of anterolisthesis of C4 on C5 in flexion that shows approximately 1 mm of retrolisthesis on extension.  -Per Dr. Myer Haff, anterolisthesis of C4 on C5 that meets the criteria for instability.   Prior level of function: Independent Occupational demands: [employer] Hobbies: crocheting  Red flags (personal history of cancer, h/o spinal tumors, history of compression fracture, chills/fever, night sweats, nausea, vomiting, unrelenting pain): Negative  Intermittent night sweats  Patient Goals: Pain relief; more use of R arm    OBJECTIVE:    Posture Forward head, mild rounded shoulders   AROM AROM (Normal range in degrees) AROM 09/22/2022  Cervical  Flexion (50) 32  Extension (80) 44  Right lateral flexion (45) 48*  Left lateral flexion (45) 38*  Right rotation (85) 62  Left rotation (85) 49  (* = pain; Blank rows = not tested)  R shoulder forward elevation AROM to 85 deg only, L  shoulder AROM grossly WNL   MMT MMT (out of 5) Right 09/22/2022 Left 09/22/2022  Cervical (isometric)  Flexion 4/5*  Extension 3+/5*  Lateral Flexion 3+/5* (R shoulder pain) WNL  Rotation WNL WNL      Shoulder   Flexion 2-/5  4/5*  Extension    Abduction 2-/5  3+/5*  Internal rotation    Horizontal abduction    External rotation  3+/5* 4-/5*  Lower Trapezius    Rhomboids        Elbow  Flexion 4+ 4+  Extension 4+ 4+  Pronation    Supination        (* = pain; Blank rows = not tested)    Palpation Location LEFT  RIGHT           Suboccipitals    Cervical paraspinals 1 2  Upper Trapezius 0 2  Levator Scapulae 0 1  Rhomboid Major/Minor 0 1  (Blank rows = not tested) Graded on 0-4 scale (0 = no pain, 1 = pain, 2 = pain with wincing/grimacing/flinching, 3 = pain with withdrawal, 4 = unwilling to allow  palpation), (Blank rows = not tested)  Repeated Movements Repeated cervical retraction in hooklying: increase, worse (increase in R upper trap/paracervical pain)  Passive Accessory Intervertebral Motion Deferred today     SPECIAL TESTS Spurlings A (ipsilateral lateral flexion/axial compression): R: Positive L: Negative Distraction Test: Positive for pain relief   Hoffman Sign (cervical cord compression): R: Negative L: Negative ULTT Median: R: Not examined L: Not examined ULTT Ulnar: R: Not examined L: Not examined ULTT Radial: R: Not examined L: Not examined     TODAY'S TREATMENT   SUBJECTIVE STATEMENT: Pt reports having notable pain between Saturday and yesterday with bouts up to 10/10 of pain. Patient reports being "up and down" since last visit. Patient reports feeling better after last visit, but she had return of symptoms the following day which did ease up (symptoms back and forth). Patient reports "almost 10" out of 10 pain at arrival.     AROM Cervical flexion:  WNL* Cervical extension: min motion loss Lateral flexion: Right WNL* , Left WNL* Cervical rotation: Right 75%*, Left WNL* *Indicates pain    Manual Therapy - for symptom modulation, soft tissue sensitivity and mobility, joint mobility, ROM    STM/DTM R>L upper trapezius and C3-6 splenius cervicis/capitis, bilat levator scapulae and L middle trapezius; x 15 minutes    *not today* Manual cervical traction, general; 10 sec on, 5 sec off; x 10 minutes Passive median nerve gliding with scapular depression; R upper limb with ULTT2A position; x20   Trigger Point Dry Needling (TDN) with electrical stimulation, dry needling unbilled Education performed with patient regarding potential benefits and risks of TDN at previous visit. Pt provided verbal consent to treatment. TDN performed to R levator scapulae and rhomboid major (rib block technique) with 0.25 x 40 mm needles with pistoning technique utilized. TDN  performed to bilateral C4 and bilateral T2 multifidi with 0.25 x 40 needles kept in for electric stimulation utilizing E-Stim II unit. 7.5 minutes at 4 intensity with 5 pps frequency; 7.5 minutes at 4 intensity with 75 pps with microcurrent. Pt demonstrates flexion-extension and R/L rotation [facility] following treatment today.      Therapeutic Exercise - for improved soft tissue flexibility and extensibility as needed for ROM, improved deep neck flexor strength as needed for stabilization of cervical column, isometrics for promoting stability and analgesic effect  Levator scapulae stretch; 2 x 30 sec bilat  Scapular retraction, 2 x10; 5 sec hold  Deep cervical flexor nod, supine; 1 x 10, 10 sec hold  -PT demo and verbal cueing for technique   *not today* Self cervical traction; technique reviewed Cervical spine isometrics, flexion/extension and lateral flexion R/L; reviewed with brief performance of flexion and lateral flexion isometrics for example    PATIENT EDUCATION:  Education details: HEP, PT POC Person educated: Patient Education method: Explanation Education comprehension: verbalized understanding   HOME EXERCISE PROGRAM: Access Code: [email or web address] URL: [email or web address] Date: 09/24/2022 Prepared by: Consuela Mimes  Exercises - Standing Isometric Cervical Extension with Manual Resistance  - 2 x daily - 7 x weekly - 2 sets - 10 reps - Standing Isometric Cervical Flexion with Manual Resistance  - 2 x daily - 7 x weekly - 2 sets - 10 reps - Standing Isometric Cervical Sidebending with Manual Resistance  - 2 x daily - 7 x weekly - 2 sets - 10 reps - 5sec hold - Seated Self Cervical Traction  - 2 x daily - 7 x weekly - 5-10sec hold - Supine Deep Neck Flexor Nods  - 2 x daily - 7 x weekly - 2 sets - 10 reps - 5sec hold    ASSESSMENT:  CLINICAL IMPRESSION: Patient unfortunately has experienced severe pain between sessions with most pain over the weekend between  Saturday to Sunday. DN with e-stim does help with symptom modulation significantly, but effects are short-term and pt does have marked flare-ups when going about her ADLs outside of clinic. She does demonstrate mostly normal cervical spine AROM with exception of mild extension motion loss (lack of lower cervical extension) and limited R rotation; pt does have pain with most planes of motion. Pt does have relief with use of traction today, and we modestly progressed program to include periscapular isometrics and longer-duration deep cervical flexor isometrics. Patient has remaining deficits in cervical spine and R shoulder ROM, R paracervical mm and UT/periscapular pain, decreased C-spine mobility. Patient will benefit from continued skilled therapeutic intervention to address the above deficits as needed for improved function and QoL.    REHAB POTENTIAL: Fair given evidence of cervical spine instability, complicated orthopedic Hx of R shoulder  CLINICAL DECISION MAKING: Unstable/unpredictable  EVALUATION COMPLEXITY: High   GOALS: Goals reviewed with patient? Yes  SHORT TERM GOALS: Target date: 10/13/2022  Pt will be independent with HEP to improve strength and decrease neck pain to improve pain-free function at home and work. Baseline: 09/22/22: Baseline HEP initiated  Goal status: INITIAL   LONG TERM GOALS: Target date: 11/03/2022  Pt will increase FOTO to at least 55 to demonstrate significant improvement in function at home and work related to neck pain  Baseline: 09/22/22: 35.   10/27/22:  Goal status: INITIAL  2.  Pt will decrease worst neck pain by at least 2 points on the NPRS in order to demonstrate clinically significant reduction in neck pain. Baseline: 09/22/22: Pain 10/10 at worst  Goal status: INITIAL  3.  Pt will have cervical spine flexion/extension WFL and rotation to 60 deg or greater for both directions without reproduction of symptoms as needed for scanning environment and  completing driving and overhead activity Baseline: 09/22/22: Motion loss with flexion, extension, L rotation  Goal status: INITIAL  4.  Pt will tolerate forward elevation of R shoulder up to 120 deg without reproduction of R upper quarter pain as needed for reaching, self-care tasks, household chores Baseline: 09/22/22: R shoulder flexion AROM up to 85 deg, pain  in upper trap/upper arm.  Goal status: INITIAL   PLAN: PT FREQUENCY: 2x/week  PT DURATION: 6 weeks  PLANNED INTERVENTIONS: Therapeutic exercises, Therapeutic activity, Neuromuscular re-education, Balance training, Gait training, Patient/Family education, Joint manipulation, Joint mobilization, Dry Needling, Electrical stimulation, Spinal mobilization, Cryotherapy, Moist heat, Taping, Traction, and Manual therapy  PLAN FOR NEXT SESSION: Manual traction and STM for symptom modulation. Continue with isometric and re-training for deep neck flexors/stabilizers. MET to improve ROM. Consider use of e-stim and DN for symptom modulation prn. Progress with postural work prn.    Consuela Mimes, PT, DPT #[id number]  Gertie Exon 10/27/2022, 10:40 AM

## 2022-10-29 ENCOUNTER — Ambulatory Visit: Payer: 59 | Admitting: Physical Therapy

## 2022-11-04 ENCOUNTER — Ambulatory Visit: Payer: 59 | Admitting: Nurse Practitioner

## 2022-11-15 NOTE — Patient Instructions (Signed)
Insomnia Insomnia is a sleep disorder that makes it difficult to fall asleep or stay asleep. Insomnia can cause fatigue, low energy, difficulty concentrating, mood swings, and poor performance at work or school. There are three different ways to classify insomnia: Difficulty falling asleep. Difficulty staying asleep. Waking up too early in the morning. Any type of insomnia can be long-term (chronic) or short-term (acute). Both are common. Short-term insomnia usually lasts for 3 months or less. Chronic insomnia occurs at least three times a week for longer than 3 months. What are the causes? Insomnia may be caused by another condition, situation, or substance, such as: Having certain mental health conditions, such as anxiety and depression. Using caffeine, alcohol, tobacco, or drugs. Having gastrointestinal conditions, such as gastroesophageal reflux disease (GERD). Having certain medical conditions. These include: Asthma. Alzheimer's disease. Stroke. Chronic pain. An overactive thyroid gland (hyperthyroidism). Other sleep disorders, such as restless legs syndrome and sleep apnea. Menopause. Sometimes, the cause of insomnia may not be known. What increases the risk? Risk factors for insomnia include: Gender. Females are affected more often than males. Age. Insomnia is more common as people get older. Stress and certain medical and mental health conditions. Lack of exercise. Having an irregular work schedule. This may include working night shifts and traveling between different time zones. What are the signs or symptoms? If you have insomnia, the main symptom is having trouble falling asleep or having trouble staying asleep. This may lead to other symptoms, such as: Feeling tired or having low energy. Feeling nervous about going to sleep. Not feeling rested in the morning. Having trouble concentrating. Feeling irritable, anxious, or depressed. How is this diagnosed? This condition  may be diagnosed based on: Your symptoms and medical history. Your health care provider may ask about: Your sleep habits. Any medical conditions you have. Your mental health. A physical exam. How is this treated? Treatment for insomnia depends on the cause. Treatment may focus on treating an underlying condition that is causing the insomnia. Treatment may also include: Medicines to help you sleep. Counseling or therapy. Lifestyle adjustments to help you sleep better. Follow these instructions at home: Eating and drinking  Limit or avoid alcohol, caffeinated beverages, and products that contain nicotine and tobacco, especially close to bedtime. These can disrupt your sleep. Do not eat a large meal or eat spicy foods right before bedtime. This can lead to digestive discomfort that can make it hard for you to sleep. Sleep habits  Keep a sleep diary to help you and your health care provider figure out what could be causing your insomnia. Write down: When you sleep. When you wake up during the night. How well you sleep and how rested you feel the next day. Any side effects of medicines you are taking. What you eat and drink. Make your bedroom a dark, comfortable place where it is easy to fall asleep. Put up shades or blackout curtains to block light from outside. Use a white noise machine to block noise. Keep the temperature cool. Limit screen use before bedtime. This includes: Not watching TV. Not using your smartphone, tablet, or computer. Stick to a routine that includes going to bed and waking up at the same times every day and night. This can help you fall asleep faster. Consider making a quiet activity, such as reading, part of your nighttime routine. Try to avoid taking naps during the day so that you sleep better at night. Get out of bed if you are still awake after   15 minutes of trying to sleep. Keep the lights down, but try reading or doing a quiet activity. When you feel  sleepy, go back to bed. General instructions Take over-the-counter and prescription medicines only as told by your health care provider. Exercise regularly as told by your health care provider. However, avoid exercising in the hours right before bedtime. Use relaxation techniques to manage stress. Ask your health care provider to suggest some techniques that may work well for you. These may include: Breathing exercises. Routines to release muscle tension. Visualizing peaceful scenes. Make sure that you drive carefully. Do not drive if you feel very sleepy. Keep all follow-up visits. This is important. Contact a health care provider if: You are tired throughout the day. You have trouble in your daily routine due to sleepiness. You continue to have sleep problems, or your sleep problems get worse. Get help right away if: You have thoughts about hurting yourself or someone else. Get help right away if you feel like you may hurt yourself or others, or have thoughts about taking your own life. Go to your nearest emergency room or: Call 911. Call the National Suicide Prevention Lifeline at 1-800-273-8255 or 988. This is open 24 hours a day. Text the Crisis Text Line at 741741. Summary Insomnia is a sleep disorder that makes it difficult to fall asleep or stay asleep. Insomnia can be long-term (chronic) or short-term (acute). Treatment for insomnia depends on the cause. Treatment may focus on treating an underlying condition that is causing the insomnia. Keep a sleep diary to help you and your health care provider figure out what could be causing your insomnia. This information is not intended to replace advice given to you by your health care provider. Make sure you discuss any questions you have with your health care provider. Document Revised: 02/10/2021 Document Reviewed: 02/10/2021 [employer] Patient Education  2024 [employer]  

## 2022-11-18 ENCOUNTER — Encounter: Payer: Self-pay | Admitting: Nurse Practitioner

## 2022-11-18 ENCOUNTER — Ambulatory Visit (INDEPENDENT_AMBULATORY_CARE_PROVIDER_SITE_OTHER): Payer: 59 | Admitting: Nurse Practitioner

## 2022-11-18 VITALS — BP 106/64 | HR 80 | Temp 97.8°F | Ht 62.0 in | Wt 111.4 lb

## 2022-11-18 DIAGNOSIS — G4709 Other insomnia: Secondary | ICD-10-CM | POA: Diagnosis not present

## 2022-11-18 DIAGNOSIS — Z23 Encounter for immunization: Secondary | ICD-10-CM

## 2022-11-18 NOTE — Assessment & Plan Note (Signed)
Chronic, stable with Trazodone on board.  No benefit from OTC medications.  Will continue Trazodone 100 MG as needed at night.  Educated her on this -- use and side effects.  Discussed with her how to take and when.  Suspect pain is major reason for difficulty sleeping, which she endorses.  Continue to collaborate with neurosurgery on this.

## 2022-11-18 NOTE — Progress Notes (Signed)
BP 106/64   Pulse 80   Temp 97.8 F (36.6 C) (Oral)   Ht 5\' 2"  (1.575 m)   Wt 111 lb 6.4 oz (50.5 kg)   SpO2 97%   BMI 20.38 kg/m    Subjective:    Patient ID: Danielle Stephens, female    DOB: 1951-01-10, 72 y.o.   MRN: [id number]  HPI: Danielle Stephens is a 72 y.o. female  Chief Complaint  Patient presents with   Insomnia   INSOMNIA Follow-up today for insomnia, increased Trazodone on 10/13/22 to 100 MG.  She does have chronic neck pain and endorses this impedes sleep.  Followed by neurosurgery for this and did go to PT recently. Has been 6 ours a night, sometimes 5 hours.  She reports this is good sleep for her.   Duration: chronic Satisfied with sleep quality: yes Difficulty falling asleep: yes Difficulty staying asleep: no Waking a few hours after sleep onset: no Early morning awakenings: yes Daytime hypersomnolence: no Wakes feeling refreshed: yes Good sleep hygiene: yes Apnea: no Snoring: no Depressed/anxious mood: no Recent stress: no Restless legs/nocturnal leg cramps: no Chronic pain/arthritis: yes History of sleep study: no Treatments attempted: melatonin and benadryl, Trazodone     Relevant past medical, surgical, family and social history reviewed and updated as indicated. Interim medical history since our last visit reviewed. Allergies and medications reviewed and updated.  Review of Systems  Constitutional:  Negative for activity change, appetite change, diaphoresis, fatigue and fever.  Respiratory:  Negative for cough, chest tightness, shortness of breath and wheezing.   Cardiovascular:  Negative for chest pain, palpitations and leg swelling.  Gastrointestinal: Negative.   Neurological: Negative.   Psychiatric/Behavioral:  Positive for sleep disturbance. Negative for decreased concentration, self-injury and suicidal ideas.     Per HPI unless specifically indicated above     Objective:    BP 106/64   Pulse 80   Temp 97.8 F (36.6 C) (Oral)   Ht 5\' 2"   (1.575 m)   Wt 111 lb 6.4 oz (50.5 kg)   SpO2 97%   BMI 20.38 kg/m   Wt Readings from Last 3 Encounters:  11/18/22 111 lb 6.4 oz (50.5 kg)  10/27/22 112 lb (50.8 kg)  10/13/22 112 lb 6.4 oz (51 kg)    Physical Exam Vitals and nursing note reviewed.  Constitutional:      General: She is awake. She is not in acute distress.    Appearance: She is well-developed and well-groomed. She is not ill-appearing or toxic-appearing.  HENT:     Head: Normocephalic.     Right Ear: Hearing and external ear normal.     Left Ear: Hearing and external ear normal.  Eyes:     General: Lids are normal.        Right eye: No discharge.        Left eye: No discharge.     Conjunctiva/sclera: Conjunctivae normal.     Pupils: Pupils are equal, round, and reactive to light.  Neck:     Thyroid: No thyromegaly.     Vascular: No carotid bruit.  Cardiovascular:     Rate and Rhythm: Normal rate and regular rhythm.     Heart sounds: Normal heart sounds. No murmur heard.    No gallop.  Pulmonary:     Effort: Pulmonary effort is normal. No accessory muscle usage or respiratory distress.     Breath sounds: Normal breath sounds.  Abdominal:     General: Bowel  sounds are normal. There is no distension.     Palpations: Abdomen is soft.     Tenderness: There is no abdominal tenderness.  Musculoskeletal:     Cervical back: Normal range of motion.     Right lower leg: No edema.     Left lower leg: No edema.  Lymphadenopathy:     Head:     Right side of head: No submental, submandibular, tonsillar, preauricular or posterior auricular adenopathy.     Left side of head: No submental, submandibular, tonsillar, preauricular or posterior auricular adenopathy.     Cervical: No cervical adenopathy.  Skin:    General: Skin is warm and dry.  Neurological:     Mental Status: She is alert and oriented to person, place, and time.     Cranial Nerves: Cranial nerves 2-12 are intact.     Motor: Motor function is intact.      Coordination: Coordination is intact.     Gait: Gait is intact.     Deep Tendon Reflexes: Reflexes are normal and symmetric.     Reflex Scores:      Brachioradialis reflexes are 2+ on the right side and 2+ on the left side.      Patellar reflexes are 2+ on the right side and 2+ on the left side. Psychiatric:        Attention and Perception: Attention normal.        Mood and Affect: Mood normal.        Speech: Speech normal.        Behavior: Behavior normal. Behavior is cooperative.        Thought Content: Thought content normal.     Results for orders placed or performed in visit on 10/14/22  TSH  Result Value Ref Range   TSH 1.880 0.450 - 4.500 uIU/mL  Comprehensive metabolic panel  Result Value Ref Range   Glucose 94 70 - 99 mg/dL   BUN 15 8 - 27 mg/dL   Creatinine, Ser 0.86 0.57 - 1.00 mg/dL   eGFR 66 >57 QI/ONG/2.95   BUN/Creatinine Ratio 16 12 - 28   Sodium 143 134 - 144 mmol/L   Potassium 3.9 3.5 - 5.2 mmol/L   Chloride 104 96 - 106 mmol/L   CO2 25 20 - 29 mmol/L   Calcium 9.2 8.7 - 10.3 mg/dL   Total Protein 6.8 6.0 - 8.5 g/dL   Albumin 4.4 3.9 - 4.9 g/dL   Globulin, Total 2.4 1.5 - 4.5 g/dL   Bilirubin Total 0.4 0.0 - 1.2 mg/dL   Alkaline Phosphatase 62 44 - 121 IU/L   AST 27 0 - 40 IU/L   ALT 16 0 - 32 IU/L  CBC with Differential/Platelet  Result Value Ref Range   WBC 4.6 3.4 - 10.8 x10E3/uL   RBC 4.73 3.77 - 5.28 x10E6/uL   Hemoglobin 12.9 11.1 - 15.9 g/dL   Hematocrit 28.4 13.2 - 46.6 %   MCV 83 79 - 97 fL   MCH 27.3 26.6 - 33.0 pg   MCHC 32.9 31.5 - 35.7 g/dL   RDW 44.0 10.2 - 72.5 %   Platelets 219 150 - 450 x10E3/uL   Neutrophils 54 Not Estab. %   Lymphs 28 Not Estab. %   Monocytes 11 Not Estab. %   Eos 6 Not Estab. %   Basos 1 Not Estab. %   Neutrophils Absolute 2.5 1.4 - 7.0 x10E3/uL   Lymphocytes Absolute 1.3 0.7 - 3.1 x10E3/uL   Monocytes  Absolute 0.5 0.1 - 0.9 x10E3/uL   EOS (ABSOLUTE) 0.3 0.0 - 0.4 x10E3/uL   Basophils Absolute 0.0 0.0 -  0.2 x10E3/uL   Immature Granulocytes 0 Not Estab. %   Immature Grans (Abs) 0.0 0.0 - 0.1 x10E3/uL      Assessment & Plan:   Problem List Items Addressed This Visit       Other   Insomnia - Primary    Chronic, stable with Trazodone on board.  No benefit from OTC medications.  Will continue Trazodone 100 MG as needed at night.  Educated her on this -- use and side effects.  Discussed with her how to take and when.  Suspect pain is major reason for difficulty sleeping, which she endorses.  Continue to collaborate with neurosurgery on this.        Follow up plan: Return in about 3 months (around 02/08/2023) for HTN/HLD, OSTEOPENIA, INSOMNIA -- Medicare [place] after 12/03/22.

## 2022-12-03 ENCOUNTER — Ambulatory Visit: Payer: Self-pay

## 2022-12-03 ENCOUNTER — Ambulatory Visit (INDEPENDENT_AMBULATORY_CARE_PROVIDER_SITE_OTHER): Payer: 59 | Admitting: Physician Assistant

## 2022-12-03 ENCOUNTER — Encounter: Payer: Self-pay | Admitting: Physician Assistant

## 2022-12-03 VITALS — BP 116/74 | HR 73 | Temp 97.9°F | Wt 111.2 lb

## 2022-12-03 DIAGNOSIS — J069 Acute upper respiratory infection, unspecified: Secondary | ICD-10-CM

## 2022-12-03 NOTE — Telephone Encounter (Signed)
  Chief Complaint: URI Symptoms: Cough, sinus congestion Frequency: Tuesday Pertinent Negatives: Patient denies Fever Disposition: [] ED /[] Urgent Care (no appt availability in office) / [] Appointment(In office/virtual)/ []  [facility]/ [x] Home Care/ [] Refused Recommended Disposition /[] [facility]/ []  Follow-up with PCP Additional Notes: Pt started with URI s/s on Tuesday. Pt would like to be seen as we are coming into the weekend. Husband is also ill and was prescribed z-pac. Recent visit to [place] where son in law had bronchitis.  Summary: cough, chest congestion   Cough, chest congestion, 2 to 3 days     Reason for Disposition  Cough with cold symptoms (e.g., runny nose, postnasal drip, throat clearing)  Answer Assessment - Initial Assessment Questions 1. ONSET: "When did the cough begin?"      Tuesday 2. SEVERITY: "How bad is the cough today?"      Now and again 3. SPUTUM: "Describe the color of your sputum" (none, dry cough; clear, white, yellow, green)     dry 4. HEMOPTYSIS: "Are you coughing up any blood?" If so ask: "How much?" (flecks, streaks, tablespoons, etc.)     no 5. DIFFICULTY BREATHING: "Are you having difficulty breathing?" If Yes, ask: "How bad is it?" (e.g., mild, moderate, severe)    - MILD: No SOB at rest, mild SOB with walking, speaks normally in sentences, can lie down, no retractions, pulse < 100.    - MODERATE: SOB at rest, SOB with minimal exertion and prefers to sit, cannot lie down flat, speaks in phrases, mild retractions, audible wheezing, pulse 100-120.    - SEVERE: Very SOB at rest, speaks in single words, struggling to breathe, sitting hunched forward, retractions, pulse > 120      no 6. FEVER: "Do you have a fever?" If Yes, ask: "What is your temperature, how was it measured, and when did it start?"     no 7. CARDIAC HISTORY: "Do you have any history of heart disease?" (e.g., heart attack, congestive heart failure)       no 8. LUNG HISTORY: "Do you have any history of lung disease?"  (e.g., pulmonary embolus, asthma, emphysema)     no 10. OTHER SYMPTOMS: "Do you have any other symptoms?" (e.g., runny nose, wheezing, chest pain)       Chest congestion, Sinus congestion  Protocols used: Cough - Acute Non-Productive-A-AH

## 2022-12-03 NOTE — Progress Notes (Signed)
Acute Office Visit   Patient: Danielle Stephens   DOB: Nov 12, 1950   72 y.o. Female  MRN: [id number] Visit Date: 12/03/2022  Today's healthcare provider: Oswaldo Conroy Hareem Surowiec, PA-C  Introduced myself to the patient as a Secondary school teacher and provided education on APPs in clinical practice.    Chief Complaint  Patient presents with   Cough    Pt states that her symptoms started last Tuesday     Nasal Congestion   Subjective    HPI HPI     Cough    Additional comments: Pt states that her symptoms started last Tuesday        Last edited by Andre Lefort, CMA on 12/03/2022  9:46 AM.       Concern for URI  Onset: sudden  Duration: She reports symptoms started on Tuesday  Associated symptoms: intermittent dry cough, mild nasal congestion, mild rhinorrhea, mild postnasal drainage Interventions: Nothing   Recent sick contacts: She reports her husband was sick recently and was seen by PCP - he was given zpack  Recent travel: traveled to [place] about 4 weeks ago and her son in law had bronchitis, reports they stayed away from him  COVID testing at home: they have not tested for COVID at home - she does not think she has it as her symptoms are not similar to previous infections     Medications: Outpatient Medications Prior to Visit  Medication Sig   amLODipine (NORVASC) 2.5 MG tablet Take 1 tablet (2.5 mg total) by mouth daily.   Cholecalciferol (VITAMIN D) 50 MCG (2000 UT) CAPS Take 2,000 Units by mouth daily. gummie   gabapentin (NEURONTIN) 300 MG capsule Take 1 capsule (300 mg total) by mouth at bedtime.   lactase (LACTAID) 3000 units tablet Take 1 tablet by mouth 3 (three) times daily with meals.   lisinopril (ZESTRIL) 20 MG tablet TAKE 1 TABLET BY MOUTH ONCE DAILY   methocarbamol (ROBAXIN) 500 MG tablet Take 1 tablet (500 mg total) by mouth every 8 (eight) hours as needed for muscle spasms.   Multiple Vitamin (MULTIVITAMIN) capsule Take 1 capsule by mouth daily.   naproxen  (NAPROSYN) 500 MG tablet Take 1 tablet (500 mg total) by mouth 2 (two) times daily as needed.   rosuvastatin (CRESTOR) 20 MG tablet Take 1 tablet (20 mg total) by mouth daily.   traZODone (DESYREL) 100 MG tablet Take 1 tablet (100 mg total) by mouth at bedtime as needed for sleep.   No facility-administered medications prior to visit.    Review of Systems  Constitutional:  Negative for chills, fatigue and fever.  HENT:  Positive for congestion, postnasal drip (mild), rhinorrhea (very mild) and sinus pressure. Negative for ear pain, sinus pain and sore throat.   Respiratory:  Positive for cough. Negative for shortness of breath and wheezing.   Gastrointestinal:  Negative for diarrhea, nausea and vomiting.  Musculoskeletal:  Negative for myalgias.  Neurological:  Negative for dizziness, light-headedness and headaches.        Objective    BP 116/74   Pulse 73   Temp 97.9 F (36.6 C) (Oral)   Wt 111 lb 3.2 oz (50.4 kg)   SpO2 99%   BMI 20.34 kg/m     Physical Exam Vitals reviewed.  Constitutional:      General: She is awake.     Appearance: Normal appearance. She is well-developed and well-groomed.  HENT:     Head:  Normocephalic and atraumatic.     Right Ear: Hearing, tympanic membrane and ear canal normal.     Left Ear: Hearing, tympanic membrane and ear canal normal.     Mouth/Throat:     Lips: Pink.     Mouth: Mucous membranes are moist.     Pharynx: Oropharynx is clear. No pharyngeal swelling, oropharyngeal exudate, posterior oropharyngeal erythema or uvula swelling.  Cardiovascular:     Rate and Rhythm: Normal rate and regular rhythm.     Pulses: Normal pulses.          Radial pulses are 2+ on the right side and 2+ on the left side.     Heart sounds: Normal heart sounds. No murmur heard.    No friction rub. No gallop.  Pulmonary:     Effort: Pulmonary effort is normal.     Breath sounds: Normal breath sounds. No decreased air movement. No decreased breath sounds,  wheezing, rhonchi or rales.  Musculoskeletal:     Cervical back: Normal range of motion and neck supple.     Right lower leg: No edema.     Left lower leg: No edema.  Lymphadenopathy:     Head:     Right side of head: No submental, submandibular or preauricular adenopathy.     Left side of head: No submental, submandibular or preauricular adenopathy.     Cervical:     Right cervical: No superficial cervical adenopathy.    Left cervical: No superficial cervical adenopathy.     Upper Body:     Right upper body: No supraclavicular adenopathy.     Left upper body: No supraclavicular adenopathy.  Neurological:     Mental Status: She is alert.  Psychiatric:        Attention and Perception: Attention and perception normal.        Mood and Affect: Mood and affect normal.        Speech: Speech normal.        Behavior: Behavior normal. Behavior is cooperative.       No results found for any visits on 12/03/22.  Assessment & Plan      No follow-ups on file.       Problem List Items Addressed This Visit   None Visit Diagnoses     Viral upper respiratory tract infection    -  Primary Visit with patient indicates symptoms comprised of mild congestion, mild dry cough, postnasal drainage and mild sinus pressure for 2 days congruent with acute URI that is likely viral in nature  She has not tested for COVID and declines testing for Flu A &B, COVID and strep today  Due to nature and duration of symptoms recommended treatment regimen is symptomatic relief and follow up if needed Discussed with patient the various viral and bacterial etiologies of current illness and appropriate course of treatment Discussed OTC medication options for multisymptom relief such as Dayquil/Nyquil, Theraflu, AlkaSeltzer, etc. Discussed return precautions if symptoms are not improving or worsen over next 5-7 days.          No follow-ups on file.   I, Wanita Derenzo E Nysa Sarin, PA-C, have reviewed all documentation  for this visit. The documentation on 12/03/22 for the exam, diagnosis, procedures, and orders are all accurate and complete.   Jacquelin Hawking, MHS, PA-C [facility]

## 2022-12-03 NOTE — Patient Instructions (Signed)

## 2022-12-08 ENCOUNTER — Ambulatory Visit: Payer: 59 | Admitting: Emergency Medicine

## 2022-12-08 VITALS — Ht 62.0 in | Wt 111.0 lb

## 2022-12-08 DIAGNOSIS — Z0000 Encounter for general adult medical examination without abnormal findings: Secondary | ICD-10-CM | POA: Diagnosis not present

## 2022-12-08 NOTE — Patient Instructions (Addendum)
Danielle Stephens , Thank you for taking time to come for your Medicare Wellness Visit. I appreciate your ongoing commitment to your health goals. Please review the following plan we discussed and let me know if I can assist you in the future.   Referrals/Orders/Follow-Ups/Clinician Recommendations: Get the flu shot at your earliest convenience. Discuss the need for a 2nd shingles vaccine with Danielle Stephens at your next OV.  This is a list of the screening recommended for you and due dates:  Health Maintenance  Topic Date Due   Zoster (Shingles) Vaccine (2 of 2) 06/06/2020   COVID-19 Vaccine (3 - 2023-24 season) 11/15/2022   Flu Shot  06/14/2023*   Mammogram  07/09/2023   Medicare Annual Wellness Visit  12/08/2023   DEXA scan (bone density measurement)  12/06/2024   Colon Cancer Screening  09/24/2026   DTaP/Tdap/Td vaccine (2 - Tdap) 09/02/2031   Pneumonia Vaccine  Completed   Hepatitis C Screening  Completed   HPV Vaccine  Aged Out  *Topic was postponed. The date shown is not the original due date.    Advanced directives: (ACP Link)Information on Advanced Care Planning can be found at [facility] of [facility] Advance Health Care Directives Advance Health Care Directives ([email or web address]   Please bring a copy of your health care power of attorney and living will to the office to be added to your chart at your convenience.   Next Medicare Annual Wellness Visit scheduled for next year: Yes, 12/14/23 @ 2:15pm  Fall Prevention in the Home, Adult Falls can cause injuries and affect people of all ages. There are many simple things that you can do to make your home safe and to help prevent falls. If you need it, ask for help making these changes. What actions can I take to prevent falls? General information Use good lighting in all rooms. Make sure to: Replace any light bulbs that burn out. Turn on lights if it is dark and use night-lights. Keep items that you use often in easy-to-reach places.  Lower the shelves around your home if needed. Move furniture so that there are clear paths around it. Do not keep throw rugs or other things on the floor that can make you trip. If any of your floors are uneven, fix them. Add color or contrast paint or tape to clearly mark and help you see: Grab bars or handrails. First and last steps of staircases. Where the edge of each step is. If you use a ladder or stepladder: Make sure that it is fully opened. Do not climb a closed ladder. Make sure the sides of the ladder are locked in place. Have someone hold the ladder while you use it. Know where your pets are as you move through your home. What can I do in the bathroom?     Keep the floor dry. Clean up any water that is on the floor right away. Remove soap buildup in the bathtub or shower. Buildup makes bathtubs and showers slippery. Use non-skid mats or decals on the floor of the bathtub or shower. Attach bath mats securely with double-sided, non-slip rug tape. If you need to sit down while you are in the shower, use a non-slip stool. Install grab bars by the toilet and in the bathtub and shower. Do not use towel bars as grab bars. What can I do in the bedroom? Make sure that you have a light by your bed that is easy to reach. Do not use any sheets or  blankets on your bed that hang to the floor. Have a firm bench or chair with side arms that you can use for support when you get dressed. What can I do in the kitchen? Clean up any spills right away. If you need to reach something above you, use a sturdy step stool that has a grab bar. Keep electrical cables out of the way. Do not use floor polish or wax that makes floors slippery. What can I do with my stairs? Do not leave anything on the stairs. Make sure that you have a light switch at the top and the bottom of the stairs. Have them installed if you do not have them. Make sure that there are handrails on both sides of the stairs. Fix  handrails that are broken or loose. Make sure that handrails are as long as the staircases. Install non-slip stair treads on all stairs in your home if they do not have carpet. Avoid having throw rugs at the top or bottom of stairs, or secure the rugs with carpet tape to prevent them from moving. Choose a carpet design that does not hide the edge of steps on the stairs. Make sure that carpet is firmly attached to the stairs. Fix any carpet that is loose or worn. What can I do on the outside of my home? Use bright outdoor lighting. Repair the edges of walkways and driveways and fix any cracks. Clear paths of anything that can make you trip, such as tools or rocks. Add color or contrast paint or tape to clearly mark and help you see high doorway thresholds. Trim any bushes or trees on the main path into your home. Check that handrails are securely fastened and in good repair. Both sides of all steps should have handrails. Install guardrails along the edges of any raised decks or porches. Have leaves, snow, and ice cleared regularly. Use sand, salt, or ice melt on walkways during winter months if you live where there is ice and snow. In the garage, clean up any spills right away, including grease or oil spills. What other actions can I take? Review your medicines with your health care provider. Some medicines can make you confused or feel dizzy. This can increase your chance of falling. Wear closed-toe shoes that fit well and support your feet. Wear shoes that have rubber soles and low heels. Use a cane, walker, scooter, or crutches that help you move around if needed. Talk with your provider about other ways that you can decrease your risk of falls. This may include seeing a physical therapist to learn to do exercises to improve movement and strength. Where to find more information Centers for Disease Control and Prevention, [employer]: TonerPromos.no [employer] on Aging: BaseRingTones.pl National  Institute on Aging: BaseRingTones.pl Contact a health care provider if: You are afraid of falling at home. You feel weak, drowsy, or dizzy at home. You fall at home. Get help right away if you: Lose consciousness or have trouble moving after a fall. Have a fall that causes a head injury. These symptoms may be an emergency. Get help right away. Call 911. Do not wait to see if the symptoms will go away. Do not drive yourself to the hospital. This information is not intended to replace advice given to you by your health care provider. Make sure you discuss any questions you have with your health care provider. Document Revised: 11/03/2021 Document Reviewed: 11/03/2021 Elsevier Patient Education  2024 [employer].

## 2022-12-08 NOTE — Progress Notes (Signed)
Subjective:   Danielle Stephens is a 72 y.o. female who presents for Medicare Annual (Subsequent) preventive examination.  Visit Complete: Virtual  I connected with  Danielle Stephens on 12/08/22 by a audio enabled telemedicine application and verified that I am speaking with the correct person using two identifiers.  Patient Location: Home  Provider Location: Office/Clinic  I discussed the limitations of evaluation and management by telemedicine. The patient expressed understanding and agreed to proceed.  Vital Signs: Because this visit was a virtual/telehealth visit, some criteria may be missing or patient reported. Any vitals not documented were not able to be obtained and vitals that have been documented are patient reported.    Cardiac Risk Factors include: advanced age (>33men, >55 women);hypertension     Objective:    Today's Vitals   12/08/22 1455 12/08/22 1456  Weight: 111 lb (50.3 kg)   Height: 5\' 2"  (1.575 m)   PainSc:  5    Body mass index is 20.3 kg/m.     12/08/2022    3:08 PM 05/25/2022    2:03 PM 03/04/2022    1:37 PM 01/21/2022    9:03 AM 09/23/2021    7:57 AM 10/28/2020    2:40 PM 05/20/2020    6:42 AM  Advanced Directives  Does Patient Have a Medical Advance Directive? Yes No No No No No No  Does patient want to make changes to medical advance directive? Yes (MAU/Ambulatory/Procedural Areas - Information given)        Would patient like information on creating a medical advance directive?    No - Patient declined No - Patient declined  No - Patient declined    Current Medications (verified) Outpatient Encounter Medications as of 12/08/2022  Medication Sig   acetaminophen (TYLENOL) 500 MG tablet Take 1,000 mg by mouth every 6 (six) hours as needed for mild pain.   amLODipine (NORVASC) 2.5 MG tablet Take 1 tablet (2.5 mg total) by mouth daily.   Biotin 1000 MCG tablet Take 1,000 mcg by mouth daily.   Cholecalciferol (VITAMIN D) 50 MCG (2000 UT) CAPS Take 2,000 Units  by mouth daily. gummie   cyanocobalamin (VITAMIN B12) 1000 MCG tablet Take 1,000 mcg by mouth daily.   gabapentin (NEURONTIN) 300 MG capsule Take 1 capsule (300 mg total) by mouth at bedtime.   lactase (LACTAID) 3000 units tablet Take 1 tablet by mouth 3 (three) times daily with meals.   lisinopril (ZESTRIL) 20 MG tablet TAKE 1 TABLET BY MOUTH ONCE DAILY   methocarbamol (ROBAXIN) 500 MG tablet Take 1 tablet (500 mg total) by mouth every 8 (eight) hours as needed for muscle spasms.   Multiple Vitamin (MULTIVITAMIN) capsule Take 1 capsule by mouth daily.   rosuvastatin (CRESTOR) 20 MG tablet Take 1 tablet (20 mg total) by mouth daily.   traZODone (DESYREL) 100 MG tablet Take 1 tablet (100 mg total) by mouth at bedtime as needed for sleep.   naproxen (NAPROSYN) 500 MG tablet Take 1 tablet (500 mg total) by mouth 2 (two) times daily as needed. (Patient not taking: Reported on 12/08/2022)   No facility-administered encounter medications on file as of 12/08/2022.    Allergies (verified) Lactose intolerance (gi) and Codeine   History: Past Medical History:  Diagnosis Date   Anxiety    Chronic kidney disease    STAGE 3   Hypertension    Shoulder pain    Past Surgical History:  Procedure Laterality Date   CLOSED MANIPULATION SHOULDER WITH STERIOD  INJECTION Right 08/28/2019   Procedure: MANIPULATION SHOULDER WITH STEROID INJECTION x2;  Surgeon: Signa Kell, MD;  Location: [facility];  Service: Orthopedics;  Laterality: Right;   COLONOSCOPY     COLONOSCOPY WITH PROPOFOL N/A 09/23/2021   Procedure: COLONOSCOPY WITH PROPOFOL;  Surgeon: Toney Reil, MD;  Location: [facility];  Service: Endoscopy;  Laterality: N/A;  ONE ASCENDING COLON POLYP NOT RETRIEVED   DILATION AND CURETTAGE, DIAGNOSTIC / THERAPEUTIC     POLYPECTOMY N/A 09/23/2021   Procedure: POLYPECTOMY;  Surgeon: Toney Reil, MD;  Location: [facility];  Service: Endoscopy;  Laterality: N/A;   REVERSE SHOULDER  ARTHROPLASTY Right 05/20/2020   Procedure: Right reverse shoulder arthroplasty, biceps tenodesis;  Surgeon: Signa Kell, MD;  Location: [facility];  Service: Orthopedics;  Laterality: Right;   rotary cuff Right 03/06/2019   SHOULDER ARTHROSCOPY Right 08/28/2019   Procedure: Right shoulder arthroscopic capsular release, lysis of adhesion,;  Surgeon: Signa Kell, MD;  Location: [facility];  Service: Orthopedics;  Laterality: Right;   Family History  Problem Relation Age of Onset   Hypertension Mother    Cancer Mother    Hypertension Father    Heart attack Father    Stroke Sister    Hypertension Sister    Heart attack Brother    Hypertension Sister    Hypertension Sister    Hypertension Sister    Hypertension Sister    Hypertension Sister    Down syndrome Brother    Breast cancer Neg Hx    Social History   Socioeconomic History   Marital status: Married    Spouse name: Windy Fast   Number of children: 2   Years of education: Not on file   Highest education level: Not on file  Occupational History   Occupation: retired  Tobacco Use   Smoking status: Never   Smokeless tobacco: Never  Vaping Use   Vaping status: Never Used  Substance and Sexual Activity   Alcohol use: No   Drug use: No   Sexual activity: Yes  Other Topics Concern   Not on file  Social History Narrative   Married, still works as a IT sales professional, has 1 biological son and 1 adopted daughter who lives in [place]   Social Determinants of Health   Financial Resource Strain: Low Risk  (12/08/2022)   Overall Financial Resource Strain (CARDIA)    Difficulty of Paying Living Expenses: Not hard at all  Food Insecurity: No Food Insecurity (12/08/2022)   Hunger Vital Sign    Worried About Running Out of Food in the Last Year: Never true    Ran Out of Food in the Last Year: Never true  Transportation Needs: No Transportation Needs (12/08/2022)   PRAPARE - Administrator, Civil Service (Medical): No    Lack of  Transportation (Non-Medical): No  Physical Activity: Insufficiently Active (12/08/2022)   Exercise Vital Sign    Days of Exercise per Week: 7 days    Minutes of Exercise per Session: 10 min  Stress: No Stress Concern Present (12/08/2022)   [employer] of Occupational Health - Occupational Stress Questionnaire    Feeling of Stress : Not at all  Social Connections: Moderately Integrated (12/08/2022)   Social Connection and Isolation Panel [NHANES]    Frequency of Communication with Friends and Family: More than three times a week    Frequency of Social Gatherings with Friends and Family: Twice a week    Attends Religious Services: More than 4 times  per year    Active Member of Clubs or Organizations: No    Attends Banker Meetings: Never    Marital Status: Married    Tobacco Counseling Counseling given: Not Answered   Clinical Intake:  Pre-visit preparation completed: Yes  Pain : 0-10 Pain Score: 5  Pain Type: Acute pain Pain Location: Shoulder Pain Orientation: Left, Right Pain Descriptors / Indicators: Aching     BMI - recorded: 20.3 Nutritional Status: BMI of 19-24  Normal Nutritional Risks: Nausea/ vomitting/ diarrhea (x1) Diabetes: No  How often do you need to have someone help you when you read instructions, pamphlets, or other written materials from your doctor or pharmacy?: 1 - Never  Interpreter Needed?: No  Information entered by :: Tora Kindred, CMA   Activities of Daily Living    12/08/2022    3:00 PM 08/04/2022    9:00 AM  In your present state of health, do you have any difficulty performing the following activities:  Hearing? 0 0  Vision? 0 0  Difficulty concentrating or making decisions? 0 0  Walking or climbing stairs? 0 0  Dressing or bathing? 0 0  Doing errands, shopping? 0 0  Preparing Food and eating ? N   Using the Toilet? N   In the past six months, have you accidently leaked urine? N   Do you have problems with loss of  bowel control? N   Managing your Medications? N   Managing your Finances? N   Housekeeping or managing your Housekeeping? N     Patient Care Team: Marjie Skiff, NP as PCP - General (Nurse Practitioner) Jim Like, RN as Registered Nurse Scarlett Presto, RN (Inactive) as Registered Nurse  Indicate any recent Medical Services you may have received from other than [facility] providers in the past year (date may be approximate).     Assessment:   This is a routine wellness examination for [facility].  Hearing/Vision screen Hearing Screening - Comments:: Denies hearing loss Vision Screening - Comments:: Gets routine eye exams   Goals Addressed               This Visit's Progress     DIET - INCREASE WATER INTAKE (pt-stated)        Depression Screen    12/08/2022    3:07 PM 12/03/2022    9:49 AM 11/18/2022    9:05 AM 10/13/2022    4:22 PM 08/07/2022   11:02 AM 08/04/2022    8:44 AM 06/23/2022    3:45 PM  PHQ 2/9 Scores  PHQ - 2 Score 0 0 0 0 0 0 0  PHQ- 9 Score 0 0 0 3 0 2 1    Fall Risk    12/08/2022    3:09 PM 12/03/2022    9:49 AM 11/18/2022    9:05 AM 08/07/2022   11:01 AM 08/04/2022    8:43 AM  Fall Risk   Falls in the past year? 1 0 0 0 0  Number falls in past yr: 0 0 0 0 0  Injury with Fall? 1 0 0 0 0  Risk for fall due to : History of fall(s) No Fall Risks No Fall Risks No Fall Risks No Fall Risks  Follow up Falls evaluation completed;Education provided;Falls prevention discussed Falls evaluation completed Falls evaluation completed Education provided Falls evaluation completed    MEDICARE RISK AT HOME: Medicare Risk at Home Any stairs in or around the home?: Yes If so, are there any  without handrails?: No Home free of loose throw rugs in walkways, pet beds, electrical cords, etc?: Yes Adequate lighting in your home to reduce risk of falls?: Yes Life alert?: No Use of a cane, walker or w/c?: No Grab bars in the bathroom?: No Shower chair or bench in shower?:  No Elevated toilet seat or a handicapped toilet?: No  TIMED UP AND GO:  Was the test performed?  No    Cognitive Function:        12/08/2022    3:10 PM 12/02/2021   10:01 AM 10/28/2020    2:43 PM 10/27/2019    2:43 PM  6CIT Screen  What Year? 0 points 0 points 0 points 0 points  What month? 0 points 0 points 0 points 0 points  What time? 0 points 0 points 0 points 0 points  Count back from 20 2 points 2 points 0 points 0 points  Months in reverse 0 points 0 points 2 points 2 points  Repeat phrase 0 points 0 points 0 points 0 points  Total Score 2 points 2 points 2 points 2 points    Immunizations Immunization History  Administered Date(s) Administered   Fluad Quad(high Dose 65+) 04/14/2021, 02/03/2022   Influenza-Unspecified 02/24/2019   PFIZER(Purple Top)SARS-COV-2 Vaccination 05/12/2019, 06/07/2019   Pneumococcal Conjugate-13 07/23/2021   Pneumococcal Polysaccharide-23 04/11/2020   Td 09/01/2021   Zoster Recombinant(Shingrix) 04/11/2020    TDAP status: Up to date  Flu Vaccine status: Due, Education has been provided regarding the importance of this vaccine. Advised may receive this vaccine at local pharmacy or Health Dept. Aware to provide a copy of the vaccination record if obtained from local pharmacy or Health Dept. Verbalized acceptance and understanding.  Pneumococcal vaccine status: Up to date  Covid-19 vaccine status: Declined, Education has been provided regarding the importance of this vaccine but patient still declined. Advised may receive this vaccine at local pharmacy or Health Dept.or vaccine clinic. Aware to provide a copy of the vaccination record if obtained from local pharmacy or Health Dept. Verbalized acceptance and understanding.  Qualifies for Shingles Vaccine? Yes   Zostavax completed No   Shingrix Completed?: No.    Education has been provided regarding the importance of this vaccine. Patient has been advised to call insurance company to determine  out of pocket expense if they have not yet received this vaccine. Advised may also receive vaccine at local pharmacy or Health Dept. Verbalized acceptance and understanding.  Screening Tests Health Maintenance  Topic Date Due   Zoster Vaccines- Shingrix (2 of 2) 06/06/2020   COVID-19 Vaccine (3 - 2023-24 season) 11/15/2022   INFLUENZA VACCINE  06/14/2023 (Originally 10/15/2022)   MAMMOGRAM  07/09/2023   Medicare Annual Wellness (AWV)  12/08/2023   DEXA SCAN  12/06/2024   Colonoscopy  09/24/2026   DTaP/Tdap/Td (2 - Tdap) 09/02/2031   Pneumonia Vaccine 65+ Years old  Completed   Hepatitis C Screening  Completed   HPV VACCINES  Aged Out    Health Maintenance  Health Maintenance Due  Topic Date Due   Zoster Vaccines- Shingrix (2 of 2) 06/06/2020   COVID-19 Vaccine (3 - 2023-24 season) 11/15/2022    Colorectal cancer screening: Type of screening: Colonoscopy. Completed 09/23/21. Repeat every 5 years  Mammogram status: Completed 07/09/22. Repeat every year  Bone Density status: Completed 12/07/19. Results reflect: Bone density results: OSTEOPENIA. Repeat every 5 years.  Lung Cancer Screening: (Low Dose CT Chest recommended if Age 18-80 years, 20 pack-year currently smoking OR have  quit w/in 15years.) does not qualify.   Lung Cancer Screening Referral: n/a  Additional Screening:  Hepatitis C Screening: does not qualify; Completed 08/17/19  Vision Screening: Recommended annual ophthalmology exams for early detection of glaucoma and other disorders of the eye. I Dental Screening: Recommended annual dental exams for proper oral hygiene   Community Resource Referral / Chronic Care Management: CRR required this visit?  No   CCM required this visit?  No     Plan:     I have personally reviewed and noted the following in the patient's chart:   Medical and social history Use of alcohol, tobacco or illicit drugs  Current medications and supplements including opioid prescriptions.  Patient is not currently taking opioid prescriptions. Functional ability and status Nutritional status Physical activity Advanced directives List of other physicians Hospitalizations, surgeries, and ER visits in previous 12 months Vitals Screenings to include cognitive, depression, and falls Referrals and appointments  In addition, I have reviewed and discussed with patient certain preventive protocols, quality metrics, and best practice recommendations. A written personalized care plan for preventive services as well as general preventive health recommendations were provided to patient.     Tora Kindred, CMA   12/08/2022   After Visit Summary: (MyChart) Due to this being a telephonic visit, the after visit summary with patients personalized plan was offered to patient via MyChart   Nurse Notes:  6 CIT Score - 2 Declined Covid vaccine Will get flu shot Has only had 1 dose of shingrix vaccine

## 2022-12-09 ENCOUNTER — Ambulatory Visit: Payer: 59 | Admitting: Nurse Practitioner

## 2022-12-09 ENCOUNTER — Ambulatory Visit: Payer: Self-pay

## 2022-12-09 ENCOUNTER — Ambulatory Visit
Admission: RE | Admit: 2022-12-09 | Discharge: 2022-12-09 | Disposition: A | Discharge status: Home / Self Care | Payer: 59 | Source: Ambulatory Visit | Attending: Nurse Practitioner | Admitting: Nurse Practitioner

## 2022-12-09 ENCOUNTER — Encounter: Payer: Self-pay | Admitting: Nurse Practitioner

## 2022-12-09 ENCOUNTER — Ambulatory Visit
Admission: RE | Admit: 2022-12-09 | Discharge: 2022-12-09 | Disposition: A | Discharge status: Home / Self Care | Payer: 59 | Attending: Nurse Practitioner | Admitting: Nurse Practitioner

## 2022-12-09 VITALS — BP 132/67 | HR 71 | Temp 98.1°F | Wt 110.8 lb

## 2022-12-09 DIAGNOSIS — M2550 Pain in unspecified joint: Secondary | ICD-10-CM | POA: Diagnosis not present

## 2022-12-09 DIAGNOSIS — M25512 Pain in left shoulder: Secondary | ICD-10-CM | POA: Diagnosis not present

## 2022-12-09 DIAGNOSIS — M25511 Pain in right shoulder: Secondary | ICD-10-CM | POA: Diagnosis not present

## 2022-12-09 DIAGNOSIS — W19XXXA Unspecified fall, initial encounter: Secondary | ICD-10-CM | POA: Diagnosis not present

## 2022-12-09 DIAGNOSIS — M25562 Pain in left knee: Secondary | ICD-10-CM | POA: Insufficient documentation

## 2022-12-09 DIAGNOSIS — R0789 Other chest pain: Secondary | ICD-10-CM | POA: Diagnosis not present

## 2022-12-09 MED ORDER — TRAMADOL HCL 50 MG PO TABS: 50.0000 mg | ORAL_TABLET | Freq: Two times a day (BID) | ORAL | 0 refills | Status: DC | PRN

## 2022-12-09 NOTE — Telephone Encounter (Signed)
      Chief Complaint: Pt. Fell Monday outside and then again Monday night. Did not hit her head. Both legs and shoulders hurt. Right ribs hurt, has bruising. Abrasion to knee. Symptoms: Above Frequency: Monday Pertinent Negatives: Patient denies  Disposition: [] ED /[] Urgent Care (no appt availability in office) / [x] Appointment(In office/virtual)/ []  [facility]/ [] Home Care/ [] Refused Recommended Disposition /[] [facility]/ []  Follow-up with PCP Additional Notes: Pt. Agrees with appointment today.  Reason for Disposition  [1] MODERATE weakness (i.e., interferes with work, school, normal activities) AND [2] new-onset or worsening  Answer Assessment - Initial Assessment Questions 1. MECHANISM: "How did the fall happen?"     Fell 2. DOMESTIC VIOLENCE AND ELDER ABUSE SCREENING: "Did you fall because someone pushed you or tried to hurt you?" If Yes, ask: "Are you safe now?"     No 3. ONSET: "When did the fall happen?" (e.g., minutes, hours, or days ago)     Tripped 4. LOCATION: "What part of the body hit the ground?" (e.g., back, buttocks, head, hips, knees, hands, head, stomach)     Both legs, shoulders, 5. INJURY: "Did you hurt (injure) yourself when you fell?" If Yes, ask: "What did you injure? Tell me more about this?" (e.g., body area; type of injury; pain severity)"     Yes 6. PAIN: "Is there any pain?" If Yes, ask: "How bad is the pain?" (e.g., Scale 1-10; or mild,  moderate, severe)   - NONE (0): No pain   - MILD (1-3): Doesn't interfere with normal activities    - MODERATE (4-7): Interferes with normal activities or awakens from sleep    - SEVERE (8-10): Excruciating pain, unable to do any normal activities      10 7. SIZE: For cuts, bruises, or swelling, ask: "How large is it?" (e.g., inches or centimeters)      Left knee - abrasion 8. PREGNANCY: "Is there any chance you are pregnant?" "When was your last menstrual period?"     No 9. OTHER SYMPTOMS:  "Do you have any other symptoms?" (e.g., dizziness, fever, weakness; new onset or worsening).      No energy 10. CAUSE: "What do you think caused the fall (or falling)?" (e.g., tripped, dizzy spell)       Tripped  Protocols used: Falls and [facility]

## 2022-12-09 NOTE — Assessment & Plan Note (Signed)
Acute due to recent falls x 2.  Injuries to both shoulders, left knee, and right ribs.  Will obtain imaging of areas.  Suspect fracture ribs to right side.  She is at increased risk with osteopenia.  Has significant reduced ROM to both shoulders.  Will send in 5 days of Tramadol, she has taken before without issue -- discussed with her not to take with Trazodone, Gabapentin, or muscle relaxer.  Recommend continue at home pain regimen.  Will place referral to ortho to further assess shoulders.

## 2022-12-09 NOTE — Patient Instructions (Signed)
IMAGING LOCATION: [id number] [place], [place] [address] [phone]   Fall Prevention in the Home, Adult Falls can cause injuries and can happen to people of all ages. There are many things you can do to make your home safer and to help prevent falls. What actions can I take to prevent falls? General information Use good lighting in all rooms. Make sure to: Replace any light bulbs that burn out. Turn on the lights in dark areas and use night-lights. Keep items that you use often in easy-to-reach places. Lower the shelves around your home if needed. Move furniture so that there are clear paths around it. Do not use throw rugs or other things on the floor that can make you trip. If any of your floors are uneven, fix them. Add color or contrast paint or tape to clearly mark and help you see: Grab bars or handrails. First and last steps of staircases. Where the edge of each step is. If you use a ladder or stepladder: Make sure that it is fully opened. Do not climb a closed ladder. Make sure the sides of the ladder are locked in place. Have someone hold the ladder while you use it. Know where your pets are as you move through your home. What can I do in the bathroom?     Keep the floor dry. Clean up any water on the floor right away. Remove soap buildup in the bathtub or shower. Buildup makes bathtubs and showers slippery. Use non-skid mats or decals on the floor of the bathtub or shower. Attach bath mats securely with double-sided, non-slip rug tape. If you need to sit down in the shower, use a non-slip stool. Install grab bars by the toilet and in the bathtub and shower. Do not use towel bars as grab bars. What can I do in the bedroom? Make sure that you have a light by your bed that is easy to reach. Do not use any sheets or blankets on your bed that hang to the floor. Have a firm chair or bench with side arms that you can use for support when you get dressed. What  can I do in the kitchen? Clean up any spills right away. If you need to reach something above you, use a step stool with a grab bar. Keep electrical cords out of the way. Do not use floor polish or wax that makes floors slippery. What can I do with my stairs? Do not leave anything on the stairs. Make sure that you have a light switch at the top and the bottom of the stairs. Make sure that there are handrails on both sides of the stairs. Fix handrails that are broken or loose. Install non-slip stair treads on all your stairs if they do not have carpet. Avoid having throw rugs at the top or bottom of the stairs. Choose a carpet that does not hide the edge of the steps on the stairs. Make sure that the carpet is firmly attached to the stairs. Fix carpet that is loose or worn. What can I do on the outside of my home? Use bright outdoor lighting. Fix the edges of walkways and driveways and fix any cracks. Clear paths of anything that can make you trip, such as tools or rocks. Add color or contrast paint or tape to clearly mark and help you see anything that might make you trip as you walk through a door, such as a raised step or threshold. Trim any bushes  or trees on paths to your home. Check to see if handrails are loose or broken and that both sides of all steps have handrails. Install guardrails along the edges of any raised decks and porches. Have leaves, snow, or ice cleared regularly. Use sand, salt, or ice melter on paths if you live where there is ice and snow during the winter. Clean up any spills in your garage right away. This includes grease or oil spills. What other actions can I take? Review your medicines with your doctor. Some medicines can cause dizziness or changes in blood pressure, which increase your risk of falling. Wear shoes that: Have a low heel. Do not wear high heels. Have rubber bottoms and are closed at the toe. Feel good on your feet and fit well. Use tools that  help you move around if needed. These include: Canes. Walkers. Scooters. Crutches. Ask your doctor what else you can do to help prevent falls. This may include seeing a physical therapist to learn to do exercises to move better and get stronger. Where to find more information Centers for Disease Control and Prevention, STEADI: TonerPromos.no [employer] on Aging: BaseRingTones.pl National Institute on Aging: BaseRingTones.pl Contact a doctor if: You are afraid of falling at home. You feel weak, drowsy, or dizzy at home. You fall at home. Get help right away if you: Lose consciousness or have trouble moving after a fall. Have a fall that causes a head injury. These symptoms may be an emergency. Get help right away. Call 911. Do not wait to see if the symptoms will go away. Do not drive yourself to the hospital. This information is not intended to replace advice given to you by your health care provider. Make sure you discuss any questions you have with your health care provider. Document Revised: 11/03/2021 Document Reviewed: 11/03/2021 Elsevier Patient Education  2024 [employer].

## 2022-12-09 NOTE — Progress Notes (Signed)
BP 132/67   Pulse 71   Temp 98.1 F (36.7 C) (Oral)   Wt 110 lb 12.8 oz (50.3 kg)   SpO2 98%   BMI 20.27 kg/m    Subjective:    Patient ID: Danielle Stephens, female    DOB: 04/25/1950, 72 y.o.   MRN: [id number]  HPI: Danielle Stephens is a 72 y.o. female  Chief Complaint  Patient presents with   Fall    Patient states she had 2 falls Monday. States she fell face first, but did not hit her head or face. States she is very sore in her R shoulder and around her ribs on her R side. Husband states that he went to help the patient up the second time and he heard something pop on the R side of her body.    MULTIPLE JOINT PAIN + FALL She had two recent falls.  Initial on Monday, 23rd, she was taking two boxes out of utility building and there is cable wire in this area, she did not see this and fell on concrete walkway.  Hit left knee and all the way up gluteus left side.  She also felt sore on left shoulder area + collarbone.    Then Tuesday, 24th,  morning had a fall, took Gabapentin Monday night, then an hour later took Trazodone.  She was feeling nauseous and was going to bathroom, then fell on her right shoulder. Her husband felt something pop in right shoulder when trying to get her up + feels she fractured ribs -- if takes deep breaths it hurts worse.  Has not seen pain clinic since 05/25/22. Duration: days Involved multiple areas (left knee/leg, right chest, shoulders): bilateral Mechanism of injury: trauma Location: diffuse Onset:sudden Severity: 7/10 when she walked in Quality:  sharp, aching, burning, tender, and throbbing Frequency: constant Radiation: no Aggravating factors: lifting and movement  Alleviating factors: propping leg up   Status: fluctuating Treatments attempted: Tylenol and Trazodone  Relief with NSAIDs?:  No NSAIDs Taken Weakness: no Numbness: no Redness: no Swelling: to left leg Bruising:  on left knee Fevers: no   Relevant past medical, surgical, family and  social history reviewed and updated as indicated. Interim medical history since our last visit reviewed. Allergies and medications reviewed and updated.  Review of Systems  Constitutional:  Negative for activity change, appetite change, diaphoresis, fatigue and fever.  Respiratory:  Negative for cough, chest tightness and shortness of breath.   Cardiovascular:  Negative for chest pain, palpitations and leg swelling.  Musculoskeletal:  Positive for arthralgias. Negative for back pain and neck pain.  Neurological: Negative.   Psychiatric/Behavioral: Negative.      Per HPI unless specifically indicated above     Objective:    BP 132/67   Pulse 71   Temp 98.1 F (36.7 C) (Oral)   Wt 110 lb 12.8 oz (50.3 kg)   SpO2 98%   BMI 20.27 kg/m   Wt Readings from Last 3 Encounters:  12/09/22 110 lb 12.8 oz (50.3 kg)  12/08/22 111 lb (50.3 kg)  12/03/22 111 lb 3.2 oz (50.4 kg)    Physical Exam Vitals and nursing note reviewed.  Constitutional:      General: She is awake. She is not in acute distress.    Appearance: Normal appearance. She is well-developed and well-groomed. She is not ill-appearing or toxic-appearing.  HENT:     Head: Normocephalic.     Right Ear: Hearing and external ear normal.  Left Ear: Hearing and external ear normal.  Eyes:     General: Lids are normal.        Right eye: No discharge.        Left eye: No discharge.     Conjunctiva/sclera: Conjunctivae normal.     Pupils: Pupils are equal, round, and reactive to light.  Neck:     Thyroid: No thyromegaly.     Vascular: No carotid bruit.  Cardiovascular:     Rate and Rhythm: Normal rate and regular rhythm.     Heart sounds: Normal heart sounds. No murmur heard.    No gallop.  Pulmonary:     Effort: Pulmonary effort is normal. No accessory muscle usage or respiratory distress.     Breath sounds: Normal breath sounds.     Comments: Tenderness to mid and lower right ribs.  No bruising. Abdominal:      General: Bowel sounds are normal. There is no distension.     Palpations: Abdomen is soft.     Tenderness: There is no abdominal tenderness.  Musculoskeletal:     Right shoulder: Tenderness present. No swelling, bony tenderness or crepitus. Decreased range of motion. Decreased strength.     Left shoulder: Tenderness present. No swelling, bony tenderness or crepitus. Decreased range of motion. Decreased strength.     Cervical back: Normal range of motion and neck supple.     Right knee: Normal.     Left knee: Swelling present. No erythema, bony tenderness or crepitus. Decreased range of motion. Tenderness present over the patellar tendon.     Instability Tests: Anterior drawer test negative. Posterior drawer test negative. Medial McMurray test negative and lateral McMurray test negative.     Right lower leg: No edema.     Left lower leg: No edema.     Comments: Right and left shoulder with abduction only able to get to 50 degrees and then pain presents.  Flexion and extension reduced to both.  Strength reduced to both.  Left knee with mild edema anterior plus area of crusting from wound with fall.  Diffused tenderness.  Discomfort with ROM.  Lymphadenopathy:     Cervical: No cervical adenopathy.  Skin:    General: Skin is warm and dry.  Neurological:     Mental Status: She is alert and oriented to person, place, and time.     Deep Tendon Reflexes: Reflexes are normal and symmetric.     Reflex Scores:      Brachioradialis reflexes are 2+ on the right side and 2+ on the left side.      Patellar reflexes are 2+ on the right side and 2+ on the left side. Psychiatric:        Attention and Perception: Attention normal.        Mood and Affect: Mood normal.        Speech: Speech normal.        Behavior: Behavior normal. Behavior is cooperative.        Thought Content: Thought content normal.     Results for orders placed or performed in visit on 10/14/22  TSH  Result Value Ref Range   TSH  1.880 0.450 - 4.500 uIU/mL  Comprehensive metabolic panel  Result Value Ref Range   Glucose 94 70 - 99 mg/dL   BUN 15 8 - 27 mg/dL   Creatinine, Ser 9.52 0.57 - 1.00 mg/dL   eGFR 66 >84 XL/KGM/0.10   BUN/Creatinine Ratio 16 12 - 28  Sodium 143 134 - 144 mmol/L   Potassium 3.9 3.5 - 5.2 mmol/L   Chloride 104 96 - 106 mmol/L   CO2 25 20 - 29 mmol/L   Calcium 9.2 8.7 - 10.3 mg/dL   Total Protein 6.8 6.0 - 8.5 g/dL   Albumin 4.4 3.9 - 4.9 g/dL   Globulin, Total 2.4 1.5 - 4.5 g/dL   Bilirubin Total 0.4 0.0 - 1.2 mg/dL   Alkaline Phosphatase 62 44 - 121 IU/L   AST 27 0 - 40 IU/L   ALT 16 0 - 32 IU/L  CBC with Differential/Platelet  Result Value Ref Range   WBC 4.6 3.4 - 10.8 x10E3/uL   RBC 4.73 3.77 - 5.28 x10E6/uL   Hemoglobin 12.9 11.1 - 15.9 g/dL   Hematocrit 47.8 29.5 - 46.6 %   MCV 83 79 - 97 fL   MCH 27.3 26.6 - 33.0 pg   MCHC 32.9 31.5 - 35.7 g/dL   RDW 62.1 30.8 - 65.7 %   Platelets 219 150 - 450 x10E3/uL   Neutrophils 54 Not Estab. %   Lymphs 28 Not Estab. %   Monocytes 11 Not Estab. %   Eos 6 Not Estab. %   Basos 1 Not Estab. %   Neutrophils Absolute 2.5 1.4 - 7.0 x10E3/uL   Lymphocytes Absolute 1.3 0.7 - 3.1 x10E3/uL   Monocytes Absolute 0.5 0.1 - 0.9 x10E3/uL   EOS (ABSOLUTE) 0.3 0.0 - 0.4 x10E3/uL   Basophils Absolute 0.0 0.0 - 0.2 x10E3/uL   Immature Granulocytes 0 Not Estab. %   Immature Grans (Abs) 0.0 0.0 - 0.1 x10E3/uL      Assessment & Plan:   Problem List Items Addressed This Visit       Other   Multiple joint pain - Primary    Acute due to recent falls x 2.  Injuries to both shoulders, left knee, and right ribs.  Will obtain imaging of areas.  Suspect fracture ribs to right side.  She is at increased risk with osteopenia.  Has significant reduced ROM to both shoulders.  Will send in 5 days of Tramadol, she has taken before without issue -- discussed with her not to take with Trazodone, Gabapentin, or muscle relaxer.  Recommend continue at home pain  regimen.  Will place referral to ortho to further assess shoulders.      Relevant Orders   DG Ribs Unilateral Right   DG Knee Complete 4 Views Left   DG Shoulder Left   DG Shoulder Right   Ambulatory referral to Orthopedic Surgery   Other Visit Diagnoses     Fall, initial encounter       Refer to multiple joint pain plan of care.   Relevant Orders   DG Ribs Unilateral Right   DG Knee Complete 4 Views Left   DG Shoulder Left   DG Shoulder Right        Follow up plan: Return if symptoms worsen or fail to improve.

## 2022-12-11 ENCOUNTER — Encounter: Payer: Self-pay | Admitting: Physician Assistant

## 2022-12-11 ENCOUNTER — Ambulatory Visit (INDEPENDENT_AMBULATORY_CARE_PROVIDER_SITE_OTHER): Payer: 59 | Admitting: Physician Assistant

## 2022-12-11 ENCOUNTER — Ambulatory Visit: Payer: Self-pay

## 2022-12-11 ENCOUNTER — Telehealth: Payer: Self-pay | Admitting: Nurse Practitioner

## 2022-12-11 VITALS — BP 116/64 | HR 71 | Ht 62.0 in | Wt 111.0 lb

## 2022-12-11 DIAGNOSIS — L509 Urticaria, unspecified: Secondary | ICD-10-CM

## 2022-12-11 NOTE — Telephone Encounter (Signed)
Copied from CRM [id number]. Topic: General - Other >> Dec 11, 2022 10:42 AM Phill Myron wrote: Reason for CRM: Ms Longshore calling on the status of her xray results

## 2022-12-11 NOTE — Telephone Encounter (Signed)
Patient in office for appointment

## 2022-12-11 NOTE — Progress Notes (Signed)
Acute Office Visit   Patient: Danielle Stephens   DOB: 06/04/1950   72 y.o. Female  MRN: [id number] Visit Date: 12/11/2022  Today's healthcare provider: Oswaldo Conroy Bilbo Carcamo, PA-C  Introduced myself to the patient as a Secondary school teacher and provided education on APPs in clinical practice.    Chief Complaint  Patient presents with   Urticaria    Patient says she was seen Wednesday by Corrie Dandy and was prescribed Tramadol last night and noticed hives. Patient says the hives have gotten better and says she found an old over the counter medication for Allergy Relief and wants to know if she could take the medication. Patient wants to discuss with provider at today's visit. Patient has Tramadol listed in her Allergies list currently.    Pruritis   Subjective    HPI HPI     Urticaria    Additional comments: Patient says she was seen Wednesday by Corrie Dandy and was prescribed Tramadol last night and noticed hives. Patient says the hives have gotten better and says she found an old over the counter medication for Allergy Relief and wants to know if she could take the medication. Patient wants to discuss with provider at today's visit. Patient has Tramadol listed in her Allergies list currently.       Last edited by Malen Gauze, CMA on 12/11/2022  1:33 PM.       Hives/Urticaria   Onset: sudden  Duration: this AM  Associated symptoms: hives and itching rash  Interventions: none   She reports taking half tablet of Tramadol around 11 pm last night then took another half shortly after  She woke up around 1 am with itching all over Around 7 am she started having headache - took two tylenol for this   She denies throat closing/swelling, angioedema, tongue swelling, chest pain, SOB, wheezing   Medications: Outpatient Medications Prior to Visit  Medication Sig   acetaminophen (TYLENOL) 500 MG tablet Take 1,000 mg by mouth every 6 (six) hours as needed for mild pain.   amLODipine (NORVASC) 2.5 MG tablet  Take 1 tablet (2.5 mg total) by mouth daily.   Biotin 1000 MCG tablet Take 1,000 mcg by mouth daily.   Cholecalciferol (VITAMIN D) 50 MCG (2000 UT) CAPS Take 2,000 Units by mouth daily. gummie   cyanocobalamin (VITAMIN B12) 1000 MCG tablet Take 1,000 mcg by mouth daily.   gabapentin (NEURONTIN) 300 MG capsule Take 1 capsule (300 mg total) by mouth at bedtime.   lactase (LACTAID) 3000 units tablet Take 1 tablet by mouth 3 (three) times daily with meals.   lisinopril (ZESTRIL) 20 MG tablet TAKE 1 TABLET BY MOUTH ONCE DAILY   methocarbamol (ROBAXIN) 500 MG tablet Take 1 tablet (500 mg total) by mouth every 8 (eight) hours as needed for muscle spasms.   Multiple Vitamin (MULTIVITAMIN) capsule Take 1 capsule by mouth daily.   rosuvastatin (CRESTOR) 20 MG tablet Take 1 tablet (20 mg total) by mouth daily.   traZODone (DESYREL) 100 MG tablet Take 1 tablet (100 mg total) by mouth at bedtime as needed for sleep.   [DISCONTINUED] traMADol (ULTRAM) 50 MG tablet Take 1 tablet (50 mg total) by mouth every 12 (twelve) hours as needed for up to 5 days. Do not take at same time as Trazodone or Gabapentin. (Patient not taking: Reported on 12/11/2022)   No facility-administered medications prior to visit.    Review of Systems  HENT:  Negative for drooling,  facial swelling and trouble swallowing.   Eyes:  Positive for itching. Negative for discharge and redness.  Respiratory:  Negative for cough, shortness of breath and wheezing.   Skin:  Positive for rash (and itching).  Neurological:  Positive for dizziness and headaches.        Objective    BP 116/64   Pulse 71   Ht 5\' 2"  (1.575 m)   Wt 111 lb (50.3 kg)   SpO2 97%   BMI 20.30 kg/m     Physical Exam Vitals reviewed.  Constitutional:      General: She is awake.     Appearance: Normal appearance. She is well-developed and well-groomed. She is not ill-appearing, toxic-appearing or diaphoretic.  HENT:     Head: Normocephalic and atraumatic.      Mouth/Throat:     Lips: Pink.     Mouth: Mucous membranes are moist.     Tongue: No lesions. Tongue does not deviate from midline.     Pharynx: Uvula midline. No pharyngeal swelling, oropharyngeal exudate, posterior oropharyngeal erythema, uvula swelling or postnasal drip.  Eyes:     General: Gaze aligned appropriately. Allergic shiner present.     Extraocular Movements: Extraocular movements intact.     Conjunctiva/sclera: Conjunctivae normal.  Cardiovascular:     Rate and Rhythm: Normal rate and regular rhythm.     Heart sounds: Normal heart sounds. No murmur heard.    No friction rub. No gallop.  Pulmonary:     Effort: Pulmonary effort is normal.     Breath sounds: Normal breath sounds. No decreased air movement. No decreased breath sounds, wheezing, rhonchi or rales.  Musculoskeletal:     Cervical back: Normal range of motion and neck supple. No edema.  Skin:    Comments: Mild erythema on forearms but no urticarial lesions or rash noted on exam    Neurological:     Mental Status: She is alert.  Psychiatric:        Behavior: Behavior is cooperative.       No results found for any visits on 12/11/22.  Assessment & Plan      No follow-ups on file.      Problem List Items Addressed This Visit   None Visit Diagnoses     Hives    -  Primary Acute, new concern Suspect this is secondary to recent Tramadol use Rash/hives appear to be resolving at time of apt. Recommend she starts daily loratadine or second generation antihistamine of choice for itching and further reduction of allergy symptoms  Can use benadryl cream as needed for lingering itching Will update allergy list with tramadol - patient informed to completely stop taking and that this will be included on allergy list from now on Reviewed signs and symptoms of severe reaction and ED precautions Follow up as needed for persistent or progressing symptoms          No follow-ups on file.   I, Latisa Belay E Quaniya Damas,  PA-C, have reviewed all documentation for this visit. The documentation on 12/11/22 for the exam, diagnosis, procedures, and orders are all accurate and complete.   Jacquelin Hawking, MHS, PA-C [facility]

## 2022-12-11 NOTE — Telephone Encounter (Signed)
  Chief Complaint: hives Symptoms: itching Frequency: 0100 this am  Pertinent Negatives: Patient denies swelling or difficulty breathing Disposition: [] ED /[] Urgent Care (no appt availability in office) / [x] Appointment(In office/virtual)/ []  [facility]/ [] Home Care/ [] Refused Recommended Disposition /[] [facility]/ []  Follow-up with PCP Additional Notes: virtual appt today Reason for Disposition  [1] MODERATE-SEVERE hives persist (i.e., hives interfere with normal activities or work) AND [2] taking antihistamine (e.g., Benadryl, Claritin) > 24 hours  Answer Assessment - Initial Assessment Questions 1. APPEARANCE: "What does the rash look like?"     hives 2. LOCATION: "Where is the rash located?"      0100 3. NUMBER: "How many hives are there?"      Arms legs chest fever 5. ONSET: "When did the hives begin?" (Hours or days ago)      0100 6. ITCHING: "Does it itch?" If Yes, ask: "How bad is the itch?"    - MILD: doesn't interfere with normal activities   - MODERATE-SEVERE: interferes with work, school, sleep, or other activities      Moderate io severe 7. RECURRENT PROBLEM: "Have you had hives before?" If Yes, ask: "When was the last time?" and "What happened that time?"      Yes 8. TRIGGERS: "Were you exposed to any new food, plant, cosmetic product or animal just before the hives began?"     Tramadol 9. OTHER SYMPTOMS: "Do you have any other symptoms?" (e.g., fever, tongue swelling, difficulty breathing, abdomen pain)     Hives and itching  Protocols used: Hives-A-AH

## 2022-12-14 ENCOUNTER — Telehealth: Payer: Self-pay | Admitting: Nurse Practitioner

## 2022-12-14 NOTE — Telephone Encounter (Signed)
Copied from CRM [id number]. Topic: General - Inquiry >> Dec 14, 2022  9:42 AM Danielle Stephens wrote: Patient called to get the results of her X-rays from last week. Please advise.

## 2022-12-15 ENCOUNTER — Telehealth: Payer: Self-pay | Admitting: Nurse Practitioner

## 2022-12-15 ENCOUNTER — Ambulatory Visit: Payer: Self-pay

## 2022-12-15 NOTE — Telephone Encounter (Addendum)
Pt. Called back to report she was told to go to ED, but she is not going, call dropped. Called pt. Back and states she has talked to "someone in the practice and I'm ok now." Declines any further assistance.    Chief Complaint: Pt. Calling to report she is going to ED because of continued pain and no results from xray's.  Symptoms: Above Frequency: Larey Seat last week Pertinent Negatives: Patient denies  Disposition: [x] ED /[] Urgent Care (no appt availability in office) / [] Appointment(In office/virtual)/ []  [facility]/ [] Home Care/ [] Refused Recommended Disposition /[]  Mobile Bus/ [x]  Follow-up with PCP Additional Notes: Pt. Wants PCP aware.  Reason for Disposition  [1] Caller has URGENT question AND [2] triager unable to answer question  Answer Assessment - Initial Assessment Questions 1. MECHANISM: "How did the fall happen?"     Fell Last week - Monday 2. DOMESTIC VIOLENCE AND ELDER ABUSE SCREENING: "Did you fall because someone pushed you or tried to hurt you?" If Yes, ask: "Are you safe now?"     No 3. ONSET: "When did the fall happen?" (e.g., minutes, hours, or days ago)     Last week 4. LOCATION: "What part of the body hit the ground?" (e.g., back, buttocks, head, hips, knees, hands, head, stomach)     Pain right ankle and left leg 5. INJURY: "Did you hurt (injure) yourself when you fell?" If Yes, ask: "What did you injure? Tell me more about this?" (e.g., body area; type of injury; pain severity)"     Yes 6. PAIN: "Is there any pain?" If Yes, ask: "How bad is the pain?" (e.g., Scale 1-10; or mild,  moderate, severe)   - NONE (0): No pain   - MILD (1-3): Doesn't interfere with normal activities    - MODERATE (4-7): Interferes with normal activities or awakens from sleep    - SEVERE (8-10): Excruciating pain, unable to do any normal activities      Severe 7. SIZE: For cuts, bruises, or swelling, ask: "How large is it?" (e.g., inches or centimeters)       N/a 8. PREGNANCY: "Is there any chance you are pregnant?" "When was your last menstrual period?"     No 9. OTHER SYMPTOMS: "Do you have any other symptoms?" (e.g., dizziness, fever, weakness; new onset or worsening).      No 10. CAUSE: "What do you think caused the fall (or falling)?" (e.g., tripped, dizzy spell)       Tripped  Protocols used: Falls and [facility]

## 2022-12-15 NOTE — Telephone Encounter (Signed)
Ms. Oley called again requesting her x-ray results.   Patient also stated that she was instructed to go to the ER by Nurse Triage but does not want to go and sit for hours.   Patient declined to make an office appointment because she already spoke with Jolene who instructed her to take deep breath, so that she does not get pneumonia   but then patient said if Jolene wants her to come in to the office she will.     FYI: Spoke with radiology [facility]) and they stated they are two weeks behind but if the Provider request a STAT read/results they would expedite the request.

## 2022-12-16 DIAGNOSIS — M542 Cervicalgia: Secondary | ICD-10-CM | POA: Diagnosis not present

## 2022-12-16 DIAGNOSIS — Z0389 Encounter for observation for other suspected diseases and conditions ruled out: Secondary | ICD-10-CM | POA: Diagnosis not present

## 2022-12-16 DIAGNOSIS — M25511 Pain in right shoulder: Secondary | ICD-10-CM | POA: Diagnosis not present

## 2022-12-16 DIAGNOSIS — S069X9A Unspecified intracranial injury with loss of consciousness of unspecified duration, initial encounter: Secondary | ICD-10-CM | POA: Diagnosis not present

## 2022-12-16 DIAGNOSIS — R0781 Pleurodynia: Secondary | ICD-10-CM | POA: Diagnosis not present

## 2022-12-16 DIAGNOSIS — J984 Other disorders of lung: Secondary | ICD-10-CM | POA: Diagnosis not present

## 2022-12-16 DIAGNOSIS — J9811 Atelectasis: Secondary | ICD-10-CM | POA: Diagnosis not present

## 2022-12-16 DIAGNOSIS — M47892 Other spondylosis, cervical region: Secondary | ICD-10-CM | POA: Diagnosis not present

## 2022-12-16 DIAGNOSIS — M4313 Spondylolisthesis, cervicothoracic region: Secondary | ICD-10-CM | POA: Diagnosis not present

## 2022-12-16 DIAGNOSIS — R55 Syncope and collapse: Secondary | ICD-10-CM | POA: Diagnosis not present

## 2022-12-16 DIAGNOSIS — I10 Essential (primary) hypertension: Secondary | ICD-10-CM | POA: Diagnosis not present

## 2022-12-16 NOTE — Telephone Encounter (Signed)
Spoke with patient to make her aware of Jolene's recommendations. Patient says she was on the way at the moment to [facility]. Patient was advised to give our office a call back if she needs [place]. Patient verbalized understanding.

## 2022-12-21 NOTE — Progress Notes (Signed)
Contacted via MyChart   Imaging returned, my nurse got it moving.  Overall imaging shows no acute findings to any location.  Your hardware on right shoulder is intact.  No fractures to ribs, shoulder, or knee.  Overall no acute findings.:)

## 2022-12-21 NOTE — Telephone Encounter (Signed)
Called [facility] because results have not been reviewed by radiology. Eber Jones states that she is going to call over to have the images pushed to the top to be read.   Routing to provider for results once available.

## 2022-12-24 ENCOUNTER — Ambulatory Visit: Payer: 59

## 2023-02-13 NOTE — Patient Instructions (Signed)

## 2023-02-17 ENCOUNTER — Encounter: Payer: Self-pay | Admitting: Nurse Practitioner

## 2023-02-17 ENCOUNTER — Ambulatory Visit (INDEPENDENT_AMBULATORY_CARE_PROVIDER_SITE_OTHER): Payer: 59 | Admitting: Nurse Practitioner

## 2023-02-17 VITALS — BP 104/65 | HR 76 | Temp 97.9°F | Ht 62.0 in | Wt 112.2 lb

## 2023-02-17 DIAGNOSIS — G894 Chronic pain syndrome: Secondary | ICD-10-CM

## 2023-02-17 DIAGNOSIS — M8588 Other specified disorders of bone density and structure, other site: Secondary | ICD-10-CM

## 2023-02-17 DIAGNOSIS — G4709 Other insomnia: Secondary | ICD-10-CM

## 2023-02-17 DIAGNOSIS — M5412 Radiculopathy, cervical region: Secondary | ICD-10-CM

## 2023-02-17 DIAGNOSIS — I10 Essential (primary) hypertension: Secondary | ICD-10-CM

## 2023-02-17 DIAGNOSIS — E782 Mixed hyperlipidemia: Secondary | ICD-10-CM

## 2023-02-17 DIAGNOSIS — Z23 Encounter for immunization: Secondary | ICD-10-CM

## 2023-02-17 DIAGNOSIS — I872 Venous insufficiency (chronic) (peripheral): Secondary | ICD-10-CM | POA: Diagnosis not present

## 2023-02-17 NOTE — Progress Notes (Signed)
BP 104/65   Pulse 76   Temp 97.9 F (36.6 C) (Oral)   Ht 5\' 2"  (1.575 m)   Wt 112 lb 3.2 oz (50.9 kg)   SpO2 98%   BMI 20.52 kg/m    Subjective:    Patient ID: [name], female    DOB: 1951/02/12, 72 y.o.   MRN: [id number]  HPI: RUTILA MCCONAUGHEY is a 72 y.o. female  Chief Complaint  Patient presents with   Hyperlipidemia   Hypertension   Insomnia   osteopenia   HYPERTENSION & HLD Taking Lisinopril, Amlodipine, and Rosuvastatin.   Hypertension status: stable  Satisfied with current treatment? yes Duration of hypertension: chronic BP monitoring frequency: occasional BP range: <130/80 BP medication side effects:  no Medication compliance: good compliance Previous BP meds: Lisinopril Aspirin: no Recurrent headaches: no Visual changes: no Palpitations: no Dyspnea: no Chest pain: no Lower extremity edema: no Dizzy/lightheaded: no  The 10-year ASCVD risk score (Arnett DK, et al., 2019) is: 7.8%   Values used to calculate the score:     Age: 89 years     Sex: Female     Is Non-Hispanic African American: No     Diabetic: No     Tobacco smoker: No     Systolic Blood Pressure: 104 mmHg     Is BP treated: Yes     HDL Cholesterol: 70 mg/dL     Total Cholesterol: 175 mg/dL  OSTEOPENIA Noted on DEXA 12/07/19 with T-score -2.2.  Continues to have neck pain which is worsening.  Saw Dr. Marcell Barlow, but would like to see [facility] for second opinion. Satisfied with current treatment?: yes Adequate calcium & vitamin D: yes Weight bearing exercises: no   INSOMNIA & CHRONIC PAIN Taking Gabapentin + Robaxin.  Sees pain clinic with last visit 06/15/2022, did get epidural.  Is to see neurosurgery, Dr. Lovell Sheehan.  Continues to have a lot of pain to spine and neck, which is worsening.  Difficulty sleeping due to pain at night -- takes Trazodone which helps. Duration: chronic Satisfied with sleep quality: no Difficulty falling asleep: yes Difficulty staying asleep:  yes Waking a few hours after sleep onset: yes Early morning awakenings: no Daytime hypersomnolence: no Wakes feeling refreshed: no Good sleep hygiene: yes Apnea: no Snoring: no Depressed/anxious mood: no Recent stress: no Restless legs/nocturnal leg cramps: no Chronic pain/arthritis: yes History of sleep study: no Treatments attempted: Melatonin, Zquil     02/17/2023    1:18 PM 12/09/2022   10:34 AM 12/08/2022    3:07 PM 12/03/2022    9:49 AM 11/18/2022    9:05 AM  Depression screen PHQ 2/9  Decreased Interest 0 0 0 0 0  Down, Depressed, Hopeless 0 0 0 0 0  PHQ - 2 Score 0 0 0 0 0  Altered sleeping 0 2 0 0 0  Tired, decreased energy 0 0 0 0 0  Change in appetite 0 0 0 0 0  Feeling bad or failure about yourself  0 0 0 0 0  Trouble concentrating 0 0 0 0 0  Moving slowly or fidgety/restless 0 0 0 0 0  Suicidal thoughts 0 0 0 0 0  PHQ-9 Score 0 2 0 0 0  Difficult doing work/chores Not difficult at all Not difficult at all Not difficult at all Not difficult at all Not difficult at all       02/17/2023    1:18 PM 12/09/2022   10:35 AM 12/03/2022  9:49 AM 11/18/2022    9:05 AM  GAD 7 : Generalized Anxiety Score  Nervous, Anxious, on Edge 0 0 0 0  Control/stop worrying 0 0 0 0  Worry too much - different things 0 0 0 0  Trouble relaxing 0 0 0 0  Restless 0 0 0 0  Easily annoyed or irritable 0 0 0 0  Afraid - awful might happen 0 0 0 0  Total GAD 7 Score 0 0 0 0  Anxiety Difficulty Not difficult at all Not difficult at all Not difficult at all Not difficult at all   Relevant past medical, surgical, family and social history reviewed and updated as indicated. Interim medical history since our last visit reviewed. Allergies and medications reviewed and updated.  Review of Systems  Constitutional:  Negative for activity change, appetite change, diaphoresis, fatigue and fever.  Respiratory:  Negative for cough, chest tightness, shortness of breath and wheezing.   Cardiovascular:   Negative for chest pain, palpitations and leg swelling.  Gastrointestinal: Negative.   Musculoskeletal:  Positive for neck pain.  Neurological: Negative.   Psychiatric/Behavioral: Negative.     Per HPI unless specifically indicated above     Objective:    BP 104/65   Pulse 76   Temp 97.9 F (36.6 C) (Oral)   Ht 5\' 2"  (1.575 m)   Wt 112 lb 3.2 oz (50.9 kg)   SpO2 98%   BMI 20.52 kg/m   Wt Readings from Last 3 Encounters:  02/17/23 112 lb 3.2 oz (50.9 kg)  12/11/22 111 lb (50.3 kg)  12/09/22 110 lb 12.8 oz (50.3 kg)    Physical Exam Vitals and nursing note reviewed.  Constitutional:      General: She is awake. She is not in acute distress.    Appearance: She is well-developed and well-groomed. She is not ill-appearing or toxic-appearing.  HENT:     Head: Normocephalic.     Right Ear: Hearing and external ear normal.     Left Ear: Hearing and external ear normal.  Eyes:     General: Lids are normal.        Right eye: No discharge.        Left eye: No discharge.     Conjunctiva/sclera: Conjunctivae normal.     Pupils: Pupils are equal, round, and reactive to light.  Neck:     Thyroid: No thyromegaly.     Vascular: No carotid bruit.  Cardiovascular:     Rate and Rhythm: Normal rate and regular rhythm.     Heart sounds: Normal heart sounds. No murmur heard.    No gallop.  Pulmonary:     Effort: Pulmonary effort is normal. No accessory muscle usage or respiratory distress.     Breath sounds: Normal breath sounds.  Abdominal:     General: Bowel sounds are normal. There is no distension.     Palpations: Abdomen is soft.     Tenderness: There is no abdominal tenderness.  Musculoskeletal:     Cervical back: Normal range of motion and neck supple.     Right lower leg: No edema.     Left lower leg: No edema.  Lymphadenopathy:     Cervical: No cervical adenopathy.  Skin:    General: Skin is warm and dry.  Neurological:     Mental Status: She is alert and oriented to  person, place, and time.     Deep Tendon Reflexes: Reflexes are normal and symmetric.  Reflex Scores:      Brachioradialis reflexes are 2+ on the right side and 2+ on the left side.      Patellar reflexes are 2+ on the right side and 2+ on the left side. Psychiatric:        Attention and Perception: Attention normal.        Mood and Affect: Mood normal.        Speech: Speech normal.        Behavior: Behavior normal. Behavior is cooperative.        Thought Content: Thought content normal.    Results for orders placed or performed in visit on 10/14/22  TSH  Result Value Ref Range   TSH 1.880 0.450 - 4.500 uIU/mL  Comprehensive metabolic panel  Result Value Ref Range   Glucose 94 70 - 99 mg/dL   BUN 15 8 - 27 mg/dL   Creatinine, Ser 4.09 0.57 - 1.00 mg/dL   eGFR 66 >81 XB/JYN/8.29   BUN/Creatinine Ratio 16 12 - 28   Sodium 143 134 - 144 mmol/L   Potassium 3.9 3.5 - 5.2 mmol/L   Chloride 104 96 - 106 mmol/L   CO2 25 20 - 29 mmol/L   Calcium 9.2 8.7 - 10.3 mg/dL   Total Protein 6.8 6.0 - 8.5 g/dL   Albumin 4.4 3.9 - 4.9 g/dL   Globulin, Total 2.4 1.5 - 4.5 g/dL   Bilirubin Total 0.4 0.0 - 1.2 mg/dL   Alkaline Phosphatase 62 44 - 121 IU/L   AST 27 0 - 40 IU/L   ALT 16 0 - 32 IU/L  CBC with Differential/Platelet  Result Value Ref Range   WBC 4.6 3.4 - 10.8 x10E3/uL   RBC 4.73 3.77 - 5.28 x10E6/uL   Hemoglobin 12.9 11.1 - 15.9 g/dL   Hematocrit 56.2 13.0 - 46.6 %   MCV 83 79 - 97 fL   MCH 27.3 26.6 - 33.0 pg   MCHC 32.9 31.5 - 35.7 g/dL   RDW 86.5 78.4 - 69.6 %   Platelets 219 150 - 450 x10E3/uL   Neutrophils 54 Not Estab. %   Lymphs 28 Not Estab. %   Monocytes 11 Not Estab. %   Eos 6 Not Estab. %   Basos 1 Not Estab. %   Neutrophils Absolute 2.5 1.4 - 7.0 x10E3/uL   Lymphocytes Absolute 1.3 0.7 - 3.1 x10E3/uL   Monocytes Absolute 0.5 0.1 - 0.9 x10E3/uL   EOS (ABSOLUTE) 0.3 0.0 - 0.4 x10E3/uL   Basophils Absolute 0.0 0.0 - 0.2 x10E3/uL   Immature Granulocytes 0 Not  Estab. %   Immature Grans (Abs) 0.0 0.0 - 0.1 x10E3/uL      Assessment & Plan:   Problem List Items Addressed This Visit       Cardiovascular and Mediastinum   Benign essential HTN - Primary    Chronic, stable.  BP well below goal today.  Continue current medication regimen and adjust as needed.  Refills up to date.  Recommend focus on DASH diet at home and continue to monitor BP regularly.  LABS: CMP and Lipid today.  She is very active at baseline.      Chronic venous insufficiency    With bilateral varicose veins.  Will continue collaboration with vascular as needed.  Recommend compression hose daily at home.          Musculoskeletal and Integument   Osteopenia     Other   Cervical radicular pain    Chronic and  ongoing issue.  She has seen neurosurgery locally, but would like a second opinion on surgery as she has history of difficulties post shoulder repair years ago and still has pain to area.  She is nervous about neck surgery, but pain is worsening.      Relevant Orders   Ambulatory referral to Neurosurgery   Chronic pain syndrome    Chronic, ongoing.  Refer to cervical pain plan of care.  Continue Gabapentin as needed.      Relevant Orders   Ambulatory referral to Neurosurgery   Hyperlipidemia, mixed    Chronic, ongoing, taking Crestor daily and tolerating.  Continue this medication regimen and adjust as needed.  Lipid panel and CMP today.      Relevant Orders   Comprehensive metabolic panel   Lipid Panel w/o Chol/HDL Ratio   Insomnia    Chronic, stable with Trazodone on board.  No benefit from OTC medications.  Will continue Trazodone 100 MG as needed at night.  Educated her on this -- use and side effects.  Discussed with her how to take and when.  Suspect pain is major reason for difficulty sleeping.        Follow up plan: Return in about 6 months (around 08/18/2023) for Annual physical after 08/04/23.

## 2023-02-17 NOTE — Assessment & Plan Note (Signed)
Chronic, stable with Trazodone on board.  No benefit from OTC medications.  Will continue Trazodone 100 MG as needed at night.  Educated her on this -- use and side effects.  Discussed with her how to take and when.  Suspect pain is major reason for difficulty sleeping.

## 2023-02-17 NOTE — Assessment & Plan Note (Signed)
Chronic, ongoing.  Refer to cervical pain plan of care.  Continue Gabapentin as needed.

## 2023-02-17 NOTE — Assessment & Plan Note (Signed)
With bilateral varicose veins.  Will continue collaboration with vascular as needed.  Recommend compression hose daily at home.

## 2023-02-17 NOTE — Assessment & Plan Note (Signed)
Chronic, ongoing, taking Crestor daily and tolerating.  Continue this medication regimen and adjust as needed.  Lipid panel and CMP today. 

## 2023-02-17 NOTE — Assessment & Plan Note (Signed)
Chronic and ongoing issue.  She has seen neurosurgery locally, but would like a second opinion on surgery as she has history of difficulties post shoulder repair years ago and still has pain to area.  She is nervous about neck surgery, but pain is worsening.

## 2023-02-17 NOTE — Assessment & Plan Note (Signed)
Chronic, stable.  BP well below goal today.  Continue current medication regimen and adjust as needed.  Refills up to date.  Recommend focus on DASH diet at home and continue to monitor BP regularly.  LABS: CMP and Lipid today.  She is very active at baseline.

## 2023-02-18 LAB — LIPID PANEL W/O CHOL/HDL RATIO
Cholesterol, Total: 173 mg/dL (ref 100–199)
HDL: 70 mg/dL (ref >39)
LDL Chol Calc (NIH): 86 mg/dL (ref 0–99)
Triglycerides: 94 mg/dL (ref 0–149)
VLDL Cholesterol Cal: 17 mg/dL (ref 5–40)

## 2023-02-18 LAB — COMPREHENSIVE METABOLIC PANEL
ALT: 16 [IU]/L (ref 0–32)
AST: 29 [IU]/L (ref 0–40)
Albumin: 4.5 g/dL (ref 3.9–4.9)
Alkaline Phosphatase: 67 [IU]/L (ref 44–121)
BUN/Creatinine Ratio: 22 (ref 12–28)
BUN: 20 mg/dL (ref 8–27)
Bilirubin Total: 0.4 mg/dL (ref 0.0–1.2)
CO2: 26 mmol/L (ref 20–29)
Calcium: 9.4 mg/dL (ref 8.7–10.3)
Chloride: 99 mmol/L (ref 96–106)
Creatinine, Ser: 0.93 mg/dL (ref 0.57–1.00)
Globulin, Total: 2.6 g/dL (ref 1.5–4.5)
Glucose: 87 mg/dL (ref 70–99)
Potassium: 4.8 mmol/L (ref 3.5–5.2)
Sodium: 138 mmol/L (ref 134–144)
Total Protein: 7.1 g/dL (ref 6.0–8.5)
eGFR: 66 mL/min/{1.73_m2} (ref >59)

## 2023-02-18 NOTE — Progress Notes (Signed)
Contacted via MyChart   Good morning [name], your labs have returned and overall remain stable.  LDL on lipid panel is still a little above goal of <70.  It would be good to increase Rosuvastatin to 40 MG and stop 20 MG tablets.  Let me know if you are okay with this.  Any questions? Keep being stellar!!  Thank you for allowing me to participate in your care.  I appreciate you. Kindest regards, Tamala Manzer

## 2023-03-12 DIAGNOSIS — M47812 Spondylosis without myelopathy or radiculopathy, cervical region: Secondary | ICD-10-CM | POA: Diagnosis not present

## 2023-03-12 DIAGNOSIS — M542 Cervicalgia: Secondary | ICD-10-CM | POA: Diagnosis not present

## 2023-04-27 ENCOUNTER — Telehealth: Payer: Self-pay | Admitting: Nurse Practitioner

## 2023-04-27 NOTE — Telephone Encounter (Signed)
Patient notified of Jolene's message.

## 2023-04-27 NOTE — Telephone Encounter (Signed)
Pt called in states, Dr Harvest Dark was going to increase her cholesterol med since it is high. She wants to know if there is a vitamin she can take instead of increasing her med.

## 2023-05-07 ENCOUNTER — Ambulatory Visit: Payer: 59 | Admitting: Internal Medicine

## 2023-05-07 ENCOUNTER — Encounter: Payer: Self-pay | Admitting: Internal Medicine

## 2023-05-07 VITALS — BP 110/64 | Ht 62.0 in | Wt 113.0 lb

## 2023-05-07 DIAGNOSIS — B029 Zoster without complications: Secondary | ICD-10-CM

## 2023-05-07 MED ORDER — VALACYCLOVIR HCL 1 G PO TABS: 1000.0000 mg | ORAL_TABLET | Freq: Three times a day (TID) | ORAL | 0 refills | Status: DC

## 2023-05-07 NOTE — Patient Instructions (Signed)
 Shingles  Shingles, or herpes zoster, is an infection. It gives you a skin rash and blisters. These infected areas may hurt a lot. Shingles only happens if: You've had chickenpox. You've been given a shot called a vaccine to protect you from getting chickenpox. Shingles is rare in this case. What are the causes? Shingles is caused by a germ called the varicella-zoster virus. This is the same germ that causes chickenpox. After you're exposed to the germ, it stays in your body but is dormant. This means it isn't active. Shingles happens if the germ becomes active again. This can happen years after you're first exposed to the germ. What increases the risk? You may be more likely to get shingles if: You're older than 71 years of age. You're under a lot of stress. You have a weak immune system. The immune system is your body's defense system. It may be weak if: You have human immunodeficiency virus (HIV). You have acquired immunodeficiency syndrome (AIDS). You have cancer. You take medicines that weaken your immune system. These include organ transplant medicines. What are the signs or symptoms? The first symptoms of shingles may be itching, tingling, or pain. Your skin may feel like it's burning. A few days or weeks later, you'll get a rash. Here's what you can expect: The rash is likely to be on one side of your body. The rash may be shaped like a belt or a band. Over time, it will turn into blisters filled with fluid. The blisters will break open and change into scabs. The scabs will dry up in about 2-3 weeks. You may also have: A fever. Chills. A headache. Nausea. How is this diagnosed? Shingles is diagnosed with a skin exam. A sample called a culture may be taken from one of your blisters and sent to a lab. This will show if you have shingles. How is this treated? The rash may last for several weeks. There's no cure for shingles, but your health care provider may give you medicines.  These medicines may: Help with pain. Help with itching. Help with irritation and swelling. Help you get better sooner. Help to prevent long-term problems. If the rash is on your face, you may need to see an eye doctor or an ear, nose, and throat (ENT) doctor. Follow these instructions at home: Medicines Take your medicines only as told by your provider. Put an anti-itch cream or numbing cream on the rash or blisters as told by your provider. Relieving itching and discomfort  To help with itching: Put cold, wet cloths called cold compresses on the rash or blisters. Take a cool bath. Try adding baking soda or dry oatmeal to the water. Do not bathe in hot water. Use calamine lotion on the rash or blisters. You can get this type of lotion at the store. Blister and rash care Keep your rash covered with a loose bandage. Wear loose clothes that don't rub on your rash. Take care of your rash as told by your provider. Make sure you: Wash your hands with soap and water for at least 20 seconds before and after you change your bandage. If you can't use soap and water, use hand sanitizer. Keep your rash and blisters clean by washing them with mild soap and cool water. Change your bandage. Check your rash every day for signs of infection. Check for: More redness, swelling, or pain. Fluid or blood. Warmth. Pus or a bad smell. Do not scratch your rash. Do not pick at your  blisters. To help you not scratch: Keep your fingernails clean and cut short. Try to wear gloves or mittens when you sleep. General instructions Rest. Wash your hands often with soap and water for at least 20 seconds. If you can't use soap and water, use hand sanitizer. Washing your hands lowers your chance of getting a skin infection. Your infection can cause chickenpox in others. If you have blisters that aren't scabs yet, stay away from: Babies. Pregnant people. Children who have eczema. Older people who have organ  transplants. People who have a long-term, or chronic, illness. Anyone who hasn't had chickenpox before. Anyone who hasn't gotten the chickenpox vaccine. How is this prevented? Vaccines are the best way to prevent you from getting chickenpox or shingles. Talk with your provider about getting these shots. Where to find more information Centers for Disease Control and Prevention (CDC): TonerPromos.no Contact a health care provider if: Your pain doesn't get better with medicine. Your pain doesn't get better after the rash heals. You have any signs of infection around the rash. Your rash or blisters get worse. You have a fever or chills. Get help right away if: The rash is on your face or nose. You have pain in your face or by your eye. You lose feeling on one side of your face. You have trouble seeing. You have ear pain or ringing in your ear. This information is not intended to replace advice given to you by your health care provider. Make sure you discuss any questions you have with your health care provider. Document Revised: 12/03/2022 Document Reviewed: 04/17/2022 Elsevier Patient Education  2024 [employer].

## 2023-05-07 NOTE — Progress Notes (Signed)
Subjective:    Patient ID: Danielle Stephens, female    DOB: Dec 11, 1950, 73 y.o.   MRN: [id number]  HPI  Discussed the use of AI scribe software for clinical note transcription with the patient, who gave verbal consent to proceed.   Danielle Stephens "Danielle Stephens" is a 73 year old female who presents with a rash.  She noticed the rash yesterday, which is associated with mild internal pain but does not itch or burn. There has been no recent contact with allergens or substances she is allergic to. The rash is located on her torso.  She received the first shingles vaccine four years ago but has not received the second dose.  She is currently taking gabapentin for nerve pain.       Review of Systems   Past Medical History:  Diagnosis Date   Anxiety    Chronic kidney disease    STAGE 3   Hypertension    Shoulder pain     Current Outpatient Medications  Medication Sig Dispense Refill   acetaminophen (TYLENOL) 500 MG tablet Take 1,000 mg by mouth every 6 (six) hours as needed for mild pain.     amLODipine (NORVASC) 2.5 MG tablet Take 1 tablet (2.5 mg total) by mouth daily. 90 tablet 4   Biotin 1000 MCG tablet Take 1,000 mcg by mouth daily.     Cholecalciferol (VITAMIN D) 50 MCG (2000 UT) CAPS Take 2,000 Units by mouth daily. gummie     cyanocobalamin (VITAMIN B12) 1000 MCG tablet Take 1,000 mcg by mouth daily.     gabapentin (NEURONTIN) 300 MG capsule Take 1 capsule (300 mg total) by mouth at bedtime. 90 capsule 2   lactase (LACTAID) 3000 units tablet Take 1 tablet by mouth 3 (three) times daily with meals.     lisinopril (ZESTRIL) 20 MG tablet TAKE 1 TABLET BY MOUTH ONCE DAILY 90 tablet 4   methocarbamol (ROBAXIN) 500 MG tablet Take 1 tablet (500 mg total) by mouth every 8 (eight) hours as needed for muscle spasms. 90 tablet 1   Multiple Vitamin (MULTIVITAMIN) capsule Take 1 capsule by mouth daily.     rosuvastatin (CRESTOR) 20 MG tablet Take 1 tablet (20 mg total) by mouth daily. 90 tablet 4    traZODone (DESYREL) 100 MG tablet Take 1 tablet (100 mg total) by mouth at bedtime as needed for sleep. 90 tablet 4   No current facility-administered medications for this visit.    Allergies  Allergen Reactions   Lactose Intolerance (Gi) Diarrhea   Codeine Rash   Tramadol Hives    Family History  Problem Relation Age of Onset   Hypertension Mother    Cancer Mother    Hypertension Father    Heart attack Father    Stroke Sister    Hypertension Sister    Heart attack Brother    Hypertension Sister    Hypertension Sister    Hypertension Sister    Hypertension Sister    Hypertension Sister    Down syndrome Brother    Breast cancer Neg Hx     Social History   Socioeconomic History   Marital status: Married    Spouse name: Windy Fast   Number of children: 2   Years of education: Not on file   Highest education level: Not on file  Occupational History   Occupation: retired  Tobacco Use   Smoking status: Never   Smokeless tobacco: Never  Vaping Use   Vaping status: Never Used  Substance and Sexual Activity   Alcohol use: No   Drug use: No   Sexual activity: Yes  Other Topics Concern   Not on file  Social History Narrative   Married, still works as a IT sales professional, has 1 biological son and 1 adopted daughter who lives in [place]   Social Drivers of Health   Financial Resource Strain: Low Risk  (12/08/2022)   Overall Financial Resource Strain (CARDIA)    Difficulty of Paying Living Expenses: Not hard at all  Food Insecurity: No Food Insecurity (12/08/2022)   Hunger Vital Sign    Worried About Running Out of Food in the Last Year: Never true    Ran Out of Food in the Last Year: Never true  Transportation Needs: No Transportation Needs (12/08/2022)   PRAPARE - Administrator, Civil Service (Medical): No    Lack of Transportation (Non-Medical): No  Physical Activity: Insufficiently Active (12/08/2022)   Exercise Vital Sign    Days of Exercise per Week: 7  days    Minutes of Exercise per Session: 10 min  Stress: No Stress Concern Present (12/08/2022)   [employer] of Occupational Health - Occupational Stress Questionnaire    Feeling of Stress : Not at all  Social Connections: Moderately Integrated (12/08/2022)   Social Connection and Isolation Panel [NHANES]    Frequency of Communication with Friends and Family: More than three times a week    Frequency of Social Gatherings with Friends and Family: Twice a week    Attends Religious Services: More than 4 times per year    Active Member of [employer] or Organizations: No    Attends Banker Meetings: Never    Marital Status: Married  Catering manager Violence: Not At Risk (12/08/2022)   Humiliation, Afraid, Rape, and Kick questionnaire    Fear of Current or Ex-Partner: No    Emotionally Abused: No    Physically Abused: No    Sexually Abused: No     Constitutional: Denies fever, malaise, fatigue, headache or abrupt weight changes.  Respiratory: Denies difficulty breathing, shortness of breath, cough or sputum production.   Cardiovascular: Denies chest pain, chest tightness, palpitations or swelling in the hands or feet.  Skin: Patient reports rash of left side of back and left abdomen.  Denies ulcercations.    No other specific complaints in a complete review of systems (except as listed in HPI above).      Objective:   Physical Exam  BP 110/64 (BP Location: Left Arm, Patient Position: Sitting, Cuff Size: Normal)   Ht 5\' 2"  (1.575 m)   Wt 113 lb (51.3 kg)   BMI 20.67 kg/m   Wt Readings from Last 3 Encounters:  02/17/23 112 lb 3.2 oz (50.9 kg)  12/11/22 111 lb (50.3 kg)  12/09/22 110 lb 12.8 oz (50.3 kg)    General: Appears her stated age, well developed, well nourished in NAD. Skin: Grouped, vesicular rash on erythematous base starting at midline back extending around the left flank to the left abdomen. Cardiovascular: Normal rate. Pulmonary/Chest: Normal effort  and positive vesicular breath sounds.  Neurological: Alert and oriented.   BMET    Component Value Date/Time   NA 138 02/17/2023 1353   K 4.8 02/17/2023 1353   CL 99 02/17/2023 1353   CO2 26 02/17/2023 1353   GLUCOSE 87 02/17/2023 1353   GLUCOSE 75 05/20/2020 0656   BUN 20 02/17/2023 1353   CREATININE 0.93 02/17/2023 1353   CALCIUM 9.4  02/17/2023 1353   GFRNONAA >60 05/16/2020 0928   GFRAA 71 02/16/2020 1438    Lipid Panel     Component Value Date/Time   CHOL 173 02/17/2023 1353   TRIG 94 02/17/2023 1353   HDL 70 02/17/2023 1353   LDLCALC 86 02/17/2023 1353    CBC    Component Value Date/Time   WBC 4.6 10/14/2022 0930   WBC 5.2 05/16/2020 0928   RBC 4.73 10/14/2022 0930   RBC 5.06 05/16/2020 0928   HGB 12.9 10/14/2022 0930   HCT 39.2 10/14/2022 0930   PLT 219 10/14/2022 0930   MCV 83 10/14/2022 0930   MCH 27.3 10/14/2022 0930   MCH 26.9 05/16/2020 0928   MCHC 32.9 10/14/2022 0930   MCHC 31.7 05/16/2020 0928   RDW 12.9 10/14/2022 0930   LYMPHSABS 1.3 10/14/2022 0930   EOSABS 0.3 10/14/2022 0930   BASOSABS 0.0 10/14/2022 0930    Hgb A1C No results found for: "HGBA1C"          Assessment & Plan:  Assessment and Plan    Herpes Zoster (Shingles) New onset rash with mild pain. Patient has not completed the two-dose Shingrix vaccination series. -Prescribe Valtrex 1g TID for 7 days. -Increase Gabapentin to TID for nerve pain. -Advise patient to avoid contact with infants, pregnant women, and immunocompromised individuals until rash resolves. -Recommend receiving the second dose of Shingrix vaccine in 90 days.   Follow-up with your PCP as previously scheduled Nicki Reaper, NP

## 2023-05-20 ENCOUNTER — Ambulatory Visit: Payer: Self-pay | Admitting: Nurse Practitioner

## 2023-05-20 NOTE — Telephone Encounter (Signed)
  Chief Complaint: severe pain left abdomen to side . S/p shingles  Symptoms: left side /abdomen severe pain from shingles. Rash healed. Pain continues difficulty walking. Has taken all prescribed medications and lidocaine patch with no relief.  Frequency: since shingles dx Pertinent Negatives: Patient denies fever no rash  Disposition: [] ED /[] Urgent Care (no appt availability in office) / [x] Appointment(In office/virtual)/ []  [facility]/ [] Home Care/ [] Refused Recommended Disposition /[] [facility]/ []  Follow-up with PCP Additional Notes:   Appt scheduled tomorrow. Please advise if medication can be prescribed .    Copied from CRM [id number]. Topic: Clinical - Red Word Triage >> May 20, 2023  3:44 PM Tiffany S wrote: Red Word that prompted transfer to Nurse Triage: Patient had shingles for the past 3 weeks she stated the shingles is gone but she is in extreme pain. She has tried lidocaine patches and pain meds nothing is working and now its hard for her walk. Pain from her hip to her back. Reason for Disposition  SEVERE pain (e.g., excruciating)  Answer Assessment - Initial Assessment Questions 1. APPEARANCE of RASH: "Describe the rash."      Rash healed  2. LOCATION: "Where is the rash located?"      Na  3. ONSET: "When did the rash start?"      na 4. ITCHING: "Does the rash itch?" If Yes, ask: "How bad is the itch?"  (Scale 1-10; or mild, moderate, severe)     na 5. PAIN: "Does the rash hurt?" If Yes, ask: "How bad is the pain?"  (Scale 0-10; or none, mild, moderate, severe)    - NONE (0): no pain    - MILD (1-3): doesn't interfere with normal activities     - MODERATE (4-7): interferes with normal activities or awakens from sleep     - SEVERE (8-10): excruciating pain, unable to do any normal activities     Severe difficulty walking  6. OTHER SYMPTOMS: "Do you have any other symptoms?" (e.g., fever)     Pain stomach to side - left side  7. PREGNANCY: "Is  there any chance you are pregnant?" "When was your last menstrual period?"     na  Protocols used: Shingles (Zoster)-A-AH

## 2023-05-21 ENCOUNTER — Encounter: Payer: Self-pay | Admitting: Nurse Practitioner

## 2023-05-21 ENCOUNTER — Ambulatory Visit: Admitting: Nurse Practitioner

## 2023-05-21 VITALS — BP 138/75 | HR 76 | Temp 97.8°F | Ht 62.0 in | Wt 113.0 lb

## 2023-05-21 DIAGNOSIS — B0229 Other postherpetic nervous system involvement: Secondary | ICD-10-CM

## 2023-05-21 MED ORDER — GABAPENTIN 100 MG PO CAPS: 100.0000 mg | ORAL_CAPSULE | Freq: Two times a day (BID) | ORAL | 2 refills | Status: DC

## 2023-05-21 MED ORDER — LIDOCAINE 5 % EX PTCH: 1.0000 | MEDICATED_PATCH | CUTANEOUS | 0 refills | Status: DC

## 2023-05-21 NOTE — Progress Notes (Signed)
 Appointment has been made

## 2023-05-21 NOTE — Assessment & Plan Note (Signed)
 Shingles diagnosed 05/07/23 and treated with Valtrex.  Rash overall is crusted over.  No blisters.  Pain is present.  Will send in Gabapentin 100 MG to take morning and afternoon + continue 300 MG at night (will send refills if needed), recommend she take these doses with 500 to 1000 MG Tylenol. No more than 4000 MG of Tylenol daily.  Lidocaine patches sent, she can cut in half and place to both locations.  Continue at home treatment.  Educated on shingles.

## 2023-05-21 NOTE — Patient Instructions (Signed)
Nerve Pain After Shingles Postherpetic neuralgia (PHN) is nerve pain you may get after you have shingles. Shingles is an infection that causes a painful rash and blisters. It's caused by the same germ that causes chickenpox. PHN affects the spot on your body where you had the shingles rash. It can last for 3 months after your rash has gone away. What are the causes? PHN may be caused by damage to your nerves. This damage may come from swelling from the shingles infection. What increases the risk? You may be more likely to get PHN if: You're older than 71 years of age. You have severe pain before your rash starts. You have a very bad rash. You have shingles in and around your eye. Your body defense system (immune system) is weak. What are the signs or symptoms? The main symptom of PHN is pain. The pain may: Be stabbing, burning, or shooting. Feel like an electric shock. Come and go, or it may be there all the time. Get worse if: Something touches your skin. The temperature goes up or down. You may also have itching. How is this diagnosed? PHN may be diagnosed based on: Your symptoms. Whether you've had shingles before. How is this treated? There's no cure, but treatment can help with the pain. Normal pain medicines may not help. You may need to work with an expert in treating pain to find what works best for you. Treatment may include: Anti-seizure medicines. Antidepressants. Strong pain medicines. A numbing patch worn on the skin. Shots of: Numbing medicines. Medicines to treat inflammation. Botulinum toxin. This can block pain signals and stop you from feeling pain. Follow these instructions at home: Medicines Take over-the-counter and prescription medicines only as told by your health care provider. Ask your provider if the medicine prescribed to you: Requires you to avoid driving or using machinery. Can cause trouble pooping or constipation. You may need to take these  actions to prevent or treat trouble pooping: Drink enough fluid to keep your pee (urine) pale yellow. Take over-the-counter or prescription medicines. Eat foods that are high in fiber, such as beans, whole grains, and fresh fruits and vegetables. Limit foods that are high in fat and processed sugars, such as fried or sweet foods. Managing pain  If told, put ice on the painful area. Put ice in a plastic bag. Place a towel between your skin and the bag. Leave the ice on for 20 minutes, 2-3 times a day. If your skin turns bright red, remove the ice right away to prevent skin damage. The risk of damage is higher if you can't feel pain, heat, or cold. Cover sensitive spots with a bandage, or dressing, to stop clothes from rubbing. Wear loose clothes. General instructions It may take a long time for you to get better. Work closely with your provider. Think about talking with a mental health care provider. They can help you find ways to cope with feeling overwhelmed or hopeless. Have a good support system at home. Think about joining a pain support group. How is this prevented? Vaccines are the best way to prevent shingles and PHN. You should get the vaccine shot for shingles once you're older than 71 years of age. Talk with your provider about getting the shot. Contact a health care provider if: Your medicine isn't helping. You can't manage your pain at home. You feel sad or depressed. Get help right away if: You have thoughts about hurting yourself or others. Get help right away if  you feel like you may hurt yourself or others, or have thoughts about taking your own life. Go to your nearest emergency room or: Call 911. Call the National Suicide Prevention Lifeline at (414)038-7354 or 988. This is open 24 hours a day. Text the Crisis Text Line at (340) 829-8385. This information is not intended to replace advice given to you by your health care provider. Make sure you discuss any questions you have  with your health care provider. Document Revised: 06/04/2022 Document Reviewed: 06/04/2022 Elsevier Patient Education  2024 [employer].

## 2023-05-21 NOTE — Progress Notes (Signed)
 BP 138/75   Pulse 76   Temp 97.8 F (36.6 C) (Oral)   Ht 5\' 2"  (1.575 m)   Wt 113 lb (51.3 kg)   SpO2 98%   BMI 20.67 kg/m    Subjective:    Patient ID: Danielle Stephens, female    DOB: 03-20-1950, 73 y.o.   MRN: [id number]  HPI: Danielle Stephens is a 73 y.o. female  Chief Complaint  Patient presents with   Herpes Zoster    Patient states she was diagnosed with shingles on 05/07/23. States she is still feeling a lot of pain on her left side. States she completed the course of Valtrex she was prescribed.    SHINGLES PAIN Was started on Valtrex on 05/07/23.  Has completed full course.  Had rash to left side and down across stomach. Has tried Lidocaine patches, which offer some benefit.  Different OTC creams and patches, but these have not helped much.  Had a few Gabapentin 100 MG left, these did help. Duration: weeks Location:  left side and lower abdomen Painful:  yes Severity: 7/10  Paresthesia:  no Hyperesthesia: yes Itching:  yes Burning:  no Oozing:  no Blisters:  no Fevers:  no History of the same:  no this was first time Alleviating factors: Gabapentin and Lidocaine Status: fluctuating Treatments attempted: Gabapentin, Lidocaine  Relevant past medical, surgical, family and social history reviewed and updated as indicated. Interim medical history since our last visit reviewed. Allergies and medications reviewed and updated.  Review of Systems  Constitutional:  Negative for activity change, appetite change, diaphoresis, fatigue and fever.  Respiratory:  Negative for cough, chest tightness, shortness of breath and wheezing.   Cardiovascular:  Negative for chest pain, palpitations and leg swelling.  Gastrointestinal: Negative.   Musculoskeletal:  Positive for arthralgias.  Skin:  Positive for rash.  Neurological:  Negative for dizziness, syncope, weakness, light-headedness, numbness and headaches.  Psychiatric/Behavioral: Negative.     Per HPI unless specifically indicated  above     Objective:    BP 138/75   Pulse 76   Temp 97.8 F (36.6 C) (Oral)   Ht 5\' 2"  (1.575 m)   Wt 113 lb (51.3 kg)   SpO2 98%   BMI 20.67 kg/m   Wt Readings from Last 3 Encounters:  05/21/23 113 lb (51.3 kg)  05/07/23 113 lb (51.3 kg)  02/17/23 112 lb 3.2 oz (50.9 kg)    Physical Exam Vitals and nursing note reviewed.  Constitutional:      General: She is awake. She is not in acute distress.    Appearance: She is well-developed and well-groomed. She is not ill-appearing or toxic-appearing.  HENT:     Head: Normocephalic.     Right Ear: Hearing and external ear normal.     Left Ear: Hearing and external ear normal.  Eyes:     General: Lids are normal.        Right eye: No discharge.        Left eye: No discharge.     Conjunctiva/sclera: Conjunctivae normal.     Pupils: Pupils are equal, round, and reactive to light.  Neck:     Thyroid: No thyromegaly.     Vascular: No carotid bruit.  Cardiovascular:     Rate and Rhythm: Normal rate and regular rhythm.     Heart sounds: Normal heart sounds. No murmur heard.    No gallop.  Pulmonary:     Effort: Pulmonary effort is normal. No  accessory muscle usage or respiratory distress.     Breath sounds: Normal breath sounds.  Abdominal:     General: Bowel sounds are normal. There is no distension.     Palpations: Abdomen is soft.     Tenderness: There is no abdominal tenderness.  Musculoskeletal:     Cervical back: Normal range of motion and neck supple.     Right lower leg: No edema.     Left lower leg: No edema.  Lymphadenopathy:     Cervical: No cervical adenopathy.  Skin:    General: Skin is warm and dry.     Findings: Rash present.     Comments: Dried patches of shingles rash present to lower left abdomen + mid left back.  Neurological:     Mental Status: She is alert and oriented to person, place, and time.     Deep Tendon Reflexes: Reflexes are normal and symmetric.     Reflex Scores:      Brachioradialis  reflexes are 2+ on the right side and 2+ on the left side.      Patellar reflexes are 2+ on the right side and 2+ on the left side. Psychiatric:        Attention and Perception: Attention normal.        Mood and Affect: Mood normal.        Speech: Speech normal.        Behavior: Behavior normal. Behavior is cooperative.        Thought Content: Thought content normal.    Results for orders placed or performed in visit on 02/17/23  Comprehensive metabolic panel   Collection Time: 02/17/23  1:53 PM  Result Value Ref Range   Glucose 87 70 - 99 mg/dL   BUN 20 8 - 27 mg/dL   Creatinine, Ser 1.61 0.57 - 1.00 mg/dL   eGFR 66 >09 UE/AVW/0.98   BUN/Creatinine Ratio 22 12 - 28   Sodium 138 134 - 144 mmol/L   Potassium 4.8 3.5 - 5.2 mmol/L   Chloride 99 96 - 106 mmol/L   CO2 26 20 - 29 mmol/L   Calcium 9.4 8.7 - 10.3 mg/dL   Total Protein 7.1 6.0 - 8.5 g/dL   Albumin 4.5 3.9 - 4.9 g/dL   Globulin, Total 2.6 1.5 - 4.5 g/dL   Bilirubin Total 0.4 0.0 - 1.2 mg/dL   Alkaline Phosphatase 67 44 - 121 IU/L   AST 29 0 - 40 IU/L   ALT 16 0 - 32 IU/L  Lipid Panel w/o Chol/HDL Ratio   Collection Time: 02/17/23  1:53 PM  Result Value Ref Range   Cholesterol, Total 173 100 - 199 mg/dL   Triglycerides 94 0 - 149 mg/dL   HDL 70 >11 mg/dL   VLDL Cholesterol Cal 17 5 - 40 mg/dL   LDL Chol Calc (NIH) 86 0 - 99 mg/dL      Assessment & Plan:   Problem List Items Addressed This Visit       Nervous and Auditory   Post herpetic neuralgia - Primary   Shingles diagnosed 05/07/23 and treated with Valtrex.  Rash overall is crusted over.  No blisters.  Pain is present.  Will send in Gabapentin 100 MG to take morning and afternoon + continue 300 MG at night (will send refills if needed), recommend she take these doses with 500 to 1000 MG Tylenol. No more than 4000 MG of Tylenol daily.  Lidocaine patches sent, she can cut in half  and place to both locations.  Continue at home treatment.  Educated on shingles.         Follow up plan: Return if symptoms worsen or fail to improve.

## 2023-05-25 ENCOUNTER — Ambulatory Visit: Payer: Self-pay | Admitting: Nurse Practitioner

## 2023-05-25 MED ORDER — HYDROXYZINE PAMOATE 25 MG PO CAPS: 25.0000 mg | ORAL_CAPSULE | Freq: Three times a day (TID) | ORAL | 0 refills | Status: DC | PRN

## 2023-05-25 NOTE — Addendum Note (Signed)
 Addended by: Aura Dials T on: 05/25/2023 04:58 PM   Modules accepted: Orders

## 2023-05-25 NOTE — Telephone Encounter (Signed)
Called and notified patient of Jolene's message. Patient verbalized understanding.

## 2023-05-25 NOTE — Telephone Encounter (Signed)
 Routing to provider to advise.

## 2023-05-25 NOTE — Telephone Encounter (Signed)
  Chief Complaint: itching Symptoms: itching, pain Frequency: constant Pertinent Negatives: Patient denies fever Disposition: [] ED /[] Urgent Care (no appt availability in office) / [x] Appointment(In office/virtual)/ []  [facility]/ [] Home Care/ [] Refused Recommended Disposition /[x] [facility]/ []  Follow-up with PCP Additional Notes:   Diagnosed with shingles on 05/21/23. Evaluated at [facility], prescribed antiviral medication. Finished medication on 05/13/23. Using gabapentin 100mg  as directed, also using her 300mg  gabapentin from previous prescription. Has had itching and pain on left side buttocks from shingles rash, now last night noted itching has moved to the right side but rash has not spread, no pain to the right side.  Using calamine lotion, Tylenol, Gabapentin, cool clothes with fair effect. Office follow up advised, patient declines follow up appointment and request another prescription be called into pharmacy. Please review and follow up.   Message from [place] S sent at 05/25/2023  9:11 AM EDT  Summary: Allergic Reaction   Copied From CRM [id number]. Reason for Triage: Patient has itching on her bottom and left side. Also has pain from shingles and itching on her left side. She is unsure why she is itching. She has taken the following medications:  gabapentin (NEURONTIN) 100 MG capsule gabapentin (NEURONTIN) 300 MG capsule Tylenol 500 MG Calamine Lotion  Callback #: [phone]      Reason for Disposition  SEVERE pain (e.g., excruciating)  Answer Assessment - Initial Assessment Questions 1. APPEARANCE of RASH: "Describe the rash."      Dry, scaly 2. LOCATION: "Where is the rash located?"      Left buttocks 3. ONSET: "When did the rash start?"      05/21/23 4. ITCHING: "Does the rash itch?" If Yes, ask: "How bad is the itch?"  (Scale 1-10; or mild, moderate, severe)     Severe 5. PAIN: "Does the rash hurt?" If Yes, ask: "How bad is the pain?"  (Scale  0-10; or none, mild, moderate, severe)    - NONE (0): no pain    - MILD (1-3): doesn't interfere with normal activities     - MODERATE (4-7): interferes with normal activities or awakens from sleep     - SEVERE (8-10): excruciating pain, unable to do any normal activities     7 but up to 10 on left, no pain on right side.  6. OTHER SYMPTOMS: "Do you have any other symptoms?" (e.g., fever)     Denies  Protocols used: Shingles (Zoster)-A-AH

## 2023-05-27 ENCOUNTER — Other Ambulatory Visit: Payer: Self-pay | Admitting: Nurse Practitioner

## 2023-05-27 ENCOUNTER — Ambulatory Visit: Payer: Self-pay | Admitting: Nurse Practitioner

## 2023-05-27 MED ORDER — HYDROXYZINE PAMOATE 25 MG PO CAPS: 25.0000 mg | ORAL_CAPSULE | Freq: Three times a day (TID) | ORAL | 0 refills | Status: DC | PRN

## 2023-05-27 NOTE — Telephone Encounter (Signed)
 Pt out of hydroxyzine and that seemed to help, would like refill. Also, pt states that the hydro that she was given for the is to much during the days, she will use for sleep, but wondering if there is anything else she could have during the day that wouldn't impact her so negatively.   Chief Complaint: recently dx with shingles Symptoms: worsening itching/pain Frequency: started 2/21 Pertinent Negatives: Patient denies fever, sob Disposition: [] ED /[] Urgent Care (no appt availability in office) / [] Appointment(In office/virtual)/ []  [facility]/ [] Home Care/ [] Refused Recommended Disposition /[] [facility]/ [x]  Follow-up with PCP Additional Notes: Pt states that she is taking hydrocodone for shingles itching. Pt states that it is a bit to strong for her so she will only take at night before bed. Pt looking for alternative during the daytime hours for the itching and pain. Pt states that she is utilizing other remedies like calamine lotion and such. Pt states she hydroxyzine helped but pt has been out of the medication for about 2 weeks. Pt states that when she was originally seen in feb and dx with shingles it was only on one side of the body since that visit the rash has spread to the other side of the body.   Copied from CRM [id number]. Topic: Clinical - Medication Question >> May 27, 2023 10:58 AM Franchot Heidelberg wrote: Reason for CRM: Pt called to report that she is only taking the medication recently prescribed hydrOXYzine (VISTARIL) 25 MG capsule at night because it makes her very sleepy.   She wants to know if there is anything else she can do during the day to treat her symptoms?  Best contact: [phone] Reason for Disposition  [1] Shingles rash already diagnosed and [2] taking antiviral medication  Answer Assessment - Initial Assessment Questions 1. APPEARANCE of RASH: "Describe the rash."      Parts are scabbing over 2. LOCATION: "Where is the rash located?"       Both buttocks, potentially starting on the head, both sides 3. ONSET: "When did the rash start?"      2/21 4. ITCHING: "Does the rash itch?" If Yes, ask: "How bad is the itch?"  (Scale 1-10; or mild, moderate, severe)     Times are severe 5. PAIN: "Does the rash hurt?" If Yes, ask: "How bad is the pain?"  (Scale 0-10; or none, mild, moderate, severe)    - NONE (0): no pain    - MILD (1-3): doesn't interfere with normal activities     - MODERATE (4-7): interferes with normal activities or awakens from sleep     - SEVERE (8-10): excruciating pain, unable to do any normal activities     7 6. OTHER SYMPTOMS: "Do you have any other symptoms?" (e.g., fever)     denies  Protocols used: Shingles (Zoster)-A-AH

## 2023-05-28 ENCOUNTER — Telehealth: Payer: Self-pay | Admitting: Nurse Practitioner

## 2023-05-28 MED ORDER — PREGABALIN 25 MG PO CAPS: 25.0000 mg | ORAL_CAPSULE | Freq: Two times a day (BID) | ORAL | 2 refills | Status: DC

## 2023-05-28 NOTE — Telephone Encounter (Signed)
 Called and spoke with the patient. She states she is ok with trying the lyrica and would like it sent in.

## 2023-05-28 NOTE — Telephone Encounter (Signed)
 Copied from CRM [id number]. Topic: General - Call Back - No Documentation >> May 27, 2023  4:21 PM Higinio Roger wrote: Reason for CRM: Patient was told to callback if she didn't hear anything regarding her medications by 3p.  Callback #: [id number]

## 2023-05-28 NOTE — Telephone Encounter (Signed)
 Patient was told to callback if she didn't hear anything regarding her medications by 3p.        Callback #: [phone]

## 2023-05-28 NOTE — Telephone Encounter (Signed)
 See other phone encounter.

## 2023-05-28 NOTE — Addendum Note (Signed)
 Addended by: Aura Dials T on: 05/28/2023 12:48 PM   Modules accepted: Orders

## 2023-05-28 NOTE — Telephone Encounter (Signed)
 Appointment has been made

## 2023-06-12 NOTE — Patient Instructions (Signed)
Nerve Pain After Shingles Postherpetic neuralgia (PHN) is nerve pain you may get after you have shingles. Shingles is an infection that causes a painful rash and blisters. It's caused by the same germ that causes chickenpox. PHN affects the spot on your body where you had the shingles rash. It can last for 3 months after your rash has gone away. What are the causes? PHN may be caused by damage to your nerves. This damage may come from swelling from the shingles infection. What increases the risk? You may be more likely to get PHN if: You're older than 71 years of age. You have severe pain before your rash starts. You have a very bad rash. You have shingles in and around your eye. Your body defense system (immune system) is weak. What are the signs or symptoms? The main symptom of PHN is pain. The pain may: Be stabbing, burning, or shooting. Feel like an electric shock. Come and go, or it may be there all the time. Get worse if: Something touches your skin. The temperature goes up or down. You may also have itching. How is this diagnosed? PHN may be diagnosed based on: Your symptoms. Whether you've had shingles before. How is this treated? There's no cure, but treatment can help with the pain. Normal pain medicines may not help. You may need to work with an expert in treating pain to find what works best for you. Treatment may include: Anti-seizure medicines. Antidepressants. Strong pain medicines. A numbing patch worn on the skin. Shots of: Numbing medicines. Medicines to treat inflammation. Botulinum toxin. This can block pain signals and stop you from feeling pain. Follow these instructions at home: Medicines Take over-the-counter and prescription medicines only as told by your health care provider. Ask your provider if the medicine prescribed to you: Requires you to avoid driving or using machinery. Can cause trouble pooping or constipation. You may need to take these  actions to prevent or treat trouble pooping: Drink enough fluid to keep your pee (urine) pale yellow. Take over-the-counter or prescription medicines. Eat foods that are high in fiber, such as beans, whole grains, and fresh fruits and vegetables. Limit foods that are high in fat and processed sugars, such as fried or sweet foods. Managing pain  If told, put ice on the painful area. Put ice in a plastic bag. Place a towel between your skin and the bag. Leave the ice on for 20 minutes, 2-3 times a day. If your skin turns bright red, remove the ice right away to prevent skin damage. The risk of damage is higher if you can't feel pain, heat, or cold. Cover sensitive spots with a bandage, or dressing, to stop clothes from rubbing. Wear loose clothes. General instructions It may take a long time for you to get better. Work closely with your provider. Think about talking with a mental health care provider. They can help you find ways to cope with feeling overwhelmed or hopeless. Have a good support system at home. Think about joining a pain support group. How is this prevented? Vaccines are the best way to prevent shingles and PHN. You should get the vaccine shot for shingles once you're older than 71 years of age. Talk with your provider about getting the shot. Contact a health care provider if: Your medicine isn't helping. You can't manage your pain at home. You feel sad or depressed. Get help right away if: You have thoughts about hurting yourself or others. Get help right away if  you feel like you may hurt yourself or others, or have thoughts about taking your own life. Go to your nearest emergency room or: Call 911. Call the National Suicide Prevention Lifeline at (414)038-7354 or 988. This is open 24 hours a day. Text the Crisis Text Line at (340) 829-8385. This information is not intended to replace advice given to you by your health care provider. Make sure you discuss any questions you have  with your health care provider. Document Revised: 06/04/2022 Document Reviewed: 06/04/2022 Elsevier Patient Education  2024 [employer].

## 2023-06-21 ENCOUNTER — Ambulatory Visit (INDEPENDENT_AMBULATORY_CARE_PROVIDER_SITE_OTHER): Admitting: Nurse Practitioner

## 2023-06-21 ENCOUNTER — Encounter: Payer: Self-pay | Admitting: Nurse Practitioner

## 2023-06-21 VITALS — BP 117/57 | HR 80 | Temp 98.7°F | Ht 62.0 in | Wt 112.4 lb

## 2023-06-21 DIAGNOSIS — B0229 Other postherpetic nervous system involvement: Secondary | ICD-10-CM | POA: Diagnosis not present

## 2023-06-21 DIAGNOSIS — M8588 Other specified disorders of bone density and structure, other site: Secondary | ICD-10-CM

## 2023-06-21 MED ORDER — PREGABALIN 50 MG PO CAPS: 50.0000 mg | ORAL_CAPSULE | Freq: Two times a day (BID) | ORAL | 4 refills | Status: DC

## 2023-06-21 NOTE — Assessment & Plan Note (Signed)
 Chronic.  Noted on DEXA 12/07/19 with T score -2.2.  Continue daily Vitamin D and check level as needed. Repeat bone density this year to see if cervical spine surgery is option for pain.

## 2023-06-21 NOTE — Assessment & Plan Note (Signed)
 Shingles diagnosed 05/07/23 and treated with Valtrex.  Rash gone and pain improving with Lyrica (not neck pain, this is chronic).  Increase Lyrica to 50 MG BID, to assist with pain overall.  Recommend she take these doses with 500 to 1000 MG Tylenol. No more than 4000 MG of Tylenol daily.  Lidocaine patches, she can cut in half and place to both locations.

## 2023-06-21 NOTE — Progress Notes (Signed)
 BP (!) 117/57   Pulse 80   Temp 98.7 F (37.1 C) (Oral)   Ht 5\' 2"  (1.575 m)   Wt 112 lb 6.4 oz (51 kg)   SpO2 97%   BMI 20.56 kg/m    Subjective:    Patient ID: Danielle Stephens, female    DOB: 1950/12/04, 73 y.o.   MRN: [id number]  HPI: Danielle Stephens is a 73 y.o. female  Chief Complaint  Patient presents with   Herpes Zoster   SHINGLES PAIN Was started on Valtrex on 05/07/23, completed full course.  Had rash to left side and down across stomach. Has tried Lidocaine patches, which offered some benefit.  Different OTC creams and patches, but these did not help much.  Tried Gabapentin, this offered no benefit.  Changed to Lyrica recently and reports this is helping better. Duration: weeks Location:  left side and lower abdomen Painful:  yes Severity: 7/10 at baseline with shoulders/spine/neck Paresthesia:  no Hyperesthesia: no Itching:  yes, improving Burning:  no Oozing:  no Blisters:  no Fevers:  no History of the same:  no this was first time Alleviating factors: Lyrica and Lidocaine Status: fluctuating Treatments attempted: Gabapentin, Lidocaine, Lyrica  Relevant past medical, surgical, family and social history reviewed and updated as indicated. Interim medical history since our last visit reviewed. Allergies and medications reviewed and updated.  Review of Systems  Constitutional:  Negative for activity change, appetite change, diaphoresis, fatigue and fever.  Respiratory:  Negative for cough, chest tightness, shortness of breath and wheezing.   Cardiovascular:  Negative for chest pain, palpitations and leg swelling.  Gastrointestinal: Negative.   Musculoskeletal:  Positive for arthralgias.  Neurological:  Negative for dizziness, syncope, weakness, light-headedness, numbness and headaches.  Psychiatric/Behavioral: Negative.     Per HPI unless specifically indicated above     Objective:    BP (!) 117/57   Pulse 80   Temp 98.7 F (37.1 C) (Oral)   Ht 5\' 2"   (1.575 m)   Wt 112 lb 6.4 oz (51 kg)   SpO2 97%   BMI 20.56 kg/m   Wt Readings from Last 3 Encounters:  06/21/23 112 lb 6.4 oz (51 kg)  05/21/23 113 lb (51.3 kg)  05/07/23 113 lb (51.3 kg)    Physical Exam Vitals and nursing note reviewed.  Constitutional:      General: She is awake. She is not in acute distress.    Appearance: She is well-developed and well-groomed. She is not ill-appearing or toxic-appearing.  HENT:     Head: Normocephalic.     Right Ear: Hearing and external ear normal.     Left Ear: Hearing and external ear normal.  Eyes:     General: Lids are normal.        Right eye: No discharge.        Left eye: No discharge.     Conjunctiva/sclera: Conjunctivae normal.     Pupils: Pupils are equal, round, and reactive to light.  Neck:     Thyroid: No thyromegaly.     Vascular: No carotid bruit.  Cardiovascular:     Rate and Rhythm: Normal rate and regular rhythm.     Heart sounds: Normal heart sounds. No murmur heard.    No gallop.  Pulmonary:     Effort: Pulmonary effort is normal. No accessory muscle usage or respiratory distress.     Breath sounds: Normal breath sounds.  Abdominal:     General: Bowel sounds are normal.  There is no distension.     Palpations: Abdomen is soft.     Tenderness: There is no abdominal tenderness.  Musculoskeletal:     Cervical back: Normal range of motion and neck supple.     Right lower leg: No edema.     Left lower leg: No edema.  Lymphadenopathy:     Cervical: No cervical adenopathy.  Skin:    General: Skin is warm and dry.     Findings: No rash.  Neurological:     Mental Status: She is alert and oriented to person, place, and time.     Deep Tendon Reflexes: Reflexes are normal and symmetric.     Reflex Scores:      Brachioradialis reflexes are 2+ on the right side and 2+ on the left side.      Patellar reflexes are 2+ on the right side and 2+ on the left side. Psychiatric:        Attention and Perception: Attention  normal.        Mood and Affect: Mood normal.        Speech: Speech normal.        Behavior: Behavior normal. Behavior is cooperative.        Thought Content: Thought content normal.    Results for orders placed or performed in visit on 02/17/23  Comprehensive metabolic panel   Collection Time: 02/17/23  1:53 PM  Result Value Ref Range   Glucose 87 70 - 99 mg/dL   BUN 20 8 - 27 mg/dL   Creatinine, Ser 9.60 0.57 - 1.00 mg/dL   eGFR 66 >45 WU/JWJ/1.91   BUN/Creatinine Ratio 22 12 - 28   Sodium 138 134 - 144 mmol/L   Potassium 4.8 3.5 - 5.2 mmol/L   Chloride 99 96 - 106 mmol/L   CO2 26 20 - 29 mmol/L   Calcium 9.4 8.7 - 10.3 mg/dL   Total Protein 7.1 6.0 - 8.5 g/dL   Albumin 4.5 3.9 - 4.9 g/dL   Globulin, Total 2.6 1.5 - 4.5 g/dL   Bilirubin Total 0.4 0.0 - 1.2 mg/dL   Alkaline Phosphatase 67 44 - 121 IU/L   AST 29 0 - 40 IU/L   ALT 16 0 - 32 IU/L  Lipid Panel w/o Chol/HDL Ratio   Collection Time: 02/17/23  1:53 PM  Result Value Ref Range   Cholesterol, Total 173 100 - 199 mg/dL   Triglycerides 94 0 - 149 mg/dL   HDL 70 >47 mg/dL   VLDL Cholesterol Cal 17 5 - 40 mg/dL   LDL Chol Calc (NIH) 86 0 - 99 mg/dL      Assessment & Plan:   Problem List Items Addressed This Visit       Nervous and Auditory   Post herpetic neuralgia - Primary   Shingles diagnosed 05/07/23 and treated with Valtrex.  Rash gone and pain improving with Lyrica (not neck pain, this is chronic).  Increase Lyrica to 50 MG BID, to assist with pain overall.  Recommend she take these doses with 500 to 1000 MG Tylenol. No more than 4000 MG of Tylenol daily.  Lidocaine patches, she can cut in half and place to both locations.          Musculoskeletal and Integument   Osteopenia   Chronic.  Noted on DEXA 12/07/19 with T score -2.2.  Continue daily Vitamin D and check level as needed. Repeat bone density this year to see if cervical spine surgery is option  for pain.      Relevant Orders   DG Bone Density       Follow up plan: Return for As scheduled May 29th.

## 2023-07-01 ENCOUNTER — Other Ambulatory Visit: Payer: Self-pay | Admitting: Nurse Practitioner

## 2023-07-01 DIAGNOSIS — Z1231 Encounter for screening mammogram for malignant neoplasm of breast: Secondary | ICD-10-CM

## 2023-07-19 DIAGNOSIS — D3131 Benign neoplasm of right choroid: Secondary | ICD-10-CM | POA: Diagnosis not present

## 2023-07-19 DIAGNOSIS — H2513 Age-related nuclear cataract, bilateral: Secondary | ICD-10-CM | POA: Diagnosis not present

## 2023-08-06 ENCOUNTER — Ambulatory Visit
Admission: RE | Admit: 2023-08-06 | Discharge: 2023-08-06 | Disposition: A | Discharge status: Home / Self Care | Source: Ambulatory Visit | Attending: Nurse Practitioner | Admitting: Nurse Practitioner

## 2023-08-06 ENCOUNTER — Other Ambulatory Visit: Payer: Self-pay | Admitting: Nurse Practitioner

## 2023-08-06 DIAGNOSIS — Z1231 Encounter for screening mammogram for malignant neoplasm of breast: Secondary | ICD-10-CM | POA: Insufficient documentation

## 2023-08-06 DIAGNOSIS — M8588 Other specified disorders of bone density and structure, other site: Secondary | ICD-10-CM | POA: Diagnosis not present

## 2023-08-06 DIAGNOSIS — Z780 Asymptomatic menopausal state: Secondary | ICD-10-CM | POA: Diagnosis not present

## 2023-08-06 DIAGNOSIS — M8589 Other specified disorders of bone density and structure, multiple sites: Secondary | ICD-10-CM | POA: Diagnosis not present

## 2023-08-08 NOTE — Patient Instructions (Signed)

## 2023-08-10 ENCOUNTER — Ambulatory Visit
Attending: Student in an Organized Health Care Education/Training Program | Admitting: Student in an Organized Health Care Education/Training Program

## 2023-08-10 ENCOUNTER — Encounter: Payer: Self-pay | Admitting: Student in an Organized Health Care Education/Training Program

## 2023-08-10 VITALS — BP 101/49 | HR 71 | Temp 97.9°F | Resp 16 | Ht 62.0 in | Wt 112.0 lb

## 2023-08-10 DIAGNOSIS — M25511 Pain in right shoulder: Secondary | ICD-10-CM | POA: Diagnosis not present

## 2023-08-10 DIAGNOSIS — M12811 Other specific arthropathies, not elsewhere classified, right shoulder: Secondary | ICD-10-CM | POA: Insufficient documentation

## 2023-08-10 DIAGNOSIS — M5010 Cervical disc disorder with radiculopathy, unspecified cervical region: Secondary | ICD-10-CM | POA: Diagnosis not present

## 2023-08-10 DIAGNOSIS — G894 Chronic pain syndrome: Secondary | ICD-10-CM | POA: Diagnosis not present

## 2023-08-10 DIAGNOSIS — M75101 Unspecified rotator cuff tear or rupture of right shoulder, not specified as traumatic: Secondary | ICD-10-CM | POA: Diagnosis not present

## 2023-08-10 DIAGNOSIS — G8929 Other chronic pain: Secondary | ICD-10-CM | POA: Insufficient documentation

## 2023-08-10 DIAGNOSIS — Z96611 Presence of right artificial shoulder joint: Secondary | ICD-10-CM | POA: Insufficient documentation

## 2023-08-10 DIAGNOSIS — M5412 Radiculopathy, cervical region: Secondary | ICD-10-CM | POA: Diagnosis not present

## 2023-08-10 NOTE — Patient Instructions (Signed)
 GENERAL RISKS AND COMPLICATIONS  What are the risk, side effects and possible complications? Generally speaking, most procedures are safe.  However, with any procedure there are risks, side effects, and the possibility of complications.  The risks and complications are dependent upon the sites that are lesioned, or the type of nerve block to be performed.  The closer the procedure is to the spine, the more serious the risks are.  Great care is taken when placing the radio frequency needles, block needles or lesioning probes, but sometimes complications can occur. Infection: Any time there is an injection through the skin, there is a risk of infection.  This is why sterile conditions are used for these blocks.  There are four possible types of infection. Localized skin infection. Central Nervous System Infection-This can be in the form of Meningitis, which can be deadly. Epidural Infections-This can be in the form of an epidural abscess, which can cause pressure inside of the spine, causing compression of the spinal cord with subsequent paralysis. This would require an emergency surgery to decompress, and there are no guarantees that the patient would recover from the paralysis. Discitis-This is an infection of the intervertebral discs.  It occurs in about 1% of discography procedures.  It is difficult to treat and it may lead to surgery.        2. Pain: the needles have to go through skin and soft tissues, will cause soreness.       3. Damage to internal structures:  The nerves to be lesioned may be near blood vessels or    other nerves which can be potentially damaged.       4. Bleeding: Bleeding is more common if the patient is taking blood thinners such as  aspirin , Coumadin, Ticiid, Plavix, etc., or if he/she have some genetic predisposition  such as hemophilia. Bleeding into the spinal canal can cause compression of the spinal  cord with subsequent paralysis.  This would require an emergency  surgery to  decompress and there are no guarantees that the patient would recover from the  paralysis.       5. Pneumothorax:  Puncturing of a lung is a possibility, every time a needle is introduced in  the area of the chest or upper back.  Pneumothorax refers to free air around the  collapsed lung(s), inside of the thoracic cavity (chest cavity).  Another two possible  complications related to a similar event would include: Hemothorax and Chylothorax.   These are variations of the Pneumothorax, where instead of air around the collapsed  lung(s), you may have blood or chyle, respectively.       6. Spinal headaches: They may occur with any procedures in the area of the spine.       7. Persistent CSF (Cerebro-Spinal Fluid) leakage: This is a rare problem, but may occur  with prolonged intrathecal or epidural catheters either due to the formation of a fistulous  track or a dural tear.       8. Nerve damage: By working so close to the spinal cord, there is always a possibility of  nerve damage, which could be as serious as a permanent spinal cord injury with  paralysis.       9. Death:  Although rare, severe deadly allergic reactions known as "Anaphylactic  reaction" can occur to any of the medications used.      10. Worsening of the symptoms:  We can always make thing worse.  What are the chances  of something like this happening? Chances of any of this occuring are extremely low.  By statistics, you have more of a chance of getting killed in a motor vehicle accident: while driving to the hospital than any of the above occurring .  Nevertheless, you should be aware that they are possibilities.  In general, it is similar to taking a shower.  Everybody knows that you can slip, hit your head and get killed.  Does that mean that you should not shower again?  Nevertheless always keep in mind that statistics do not mean anything if you happen to be on the wrong side of them.  Even if a procedure has a 1 (one) in a  1,000,000 (million) chance of going wrong, it you happen to be that one..Also, keep in mind that by statistics, you have more of a chance of having something go wrong when taking medications.  Who should not have this procedure? If you are on a blood thinning medication (e.g. Coumadin, Plavix, see list of "Blood Thinners"), or if you have an active infection going on, you should not have the procedure.  If you are taking any blood thinners, please inform your physician.  How should I prepare for this procedure? Do not eat or drink anything at least six hours prior to the procedure. Bring a driver with you .  It cannot be a taxi. Come accompanied by an adult that can drive you back, and that is strong enough to help you if your legs get weak or numb from the local anesthetic. Take all of your medicines the morning of the procedure with just enough water  to swallow them. If you have diabetes, make sure that you are scheduled to have your procedure done first thing in the morning, whenever possible. If you have diabetes, take only half of your insulin dose and notify our nurse that you have done so as soon as you arrive at the clinic. If you are diabetic, but only take blood sugar pills (oral hypoglycemic), then do not take them on the morning of your procedure.  You may take them after you have had the procedure. Do not take aspirin  or any aspirin -containing medications, at least eleven (11) days prior to the procedure.  They may prolong bleeding. Wear loose fitting clothing that may be easy to take off and that you would not mind if it got stained with Betadine or blood. Do not wear any jewelry or perfume Remove any nail coloring.  It will interfere with some of our monitoring equipment.  NOTE: Remember that this is not meant to be interpreted as a complete list of all possible complications.  Unforeseen problems may occur.  BLOOD THINNERS The following drugs contain aspirin  or other products,  which can cause increased bleeding during surgery and should not be taken for 2 weeks prior to and 1 week after surgery.  If you should need take something for relief of minor pain, you may take acetaminophen  which is found in Tylenol ,m Datril, Anacin-3 and Panadol. It is not blood thinner. The products listed below are.  Do not take any of the products listed below in addition to any listed on your instruction sheet.  A.P.C or A.P.C with Codeine Codeine Phosphate Capsules #3 Ibuprofen  Ridaura  ABC compound Congesprin Imuran rimadil  Advil  Cope Indocin Robaxisal  Alka-Seltzer Effervescent Pain Reliever and Antacid Coricidin or Coricidin-D  Indomethacin Rufen   Alka-Seltzer plus Cold Medicine Cosprin Ketoprofen S-A-C Tablets  Anacin Analgesic Tablets or Capsules Coumadin  Korlgesic Salflex  Anacin Extra Strength Analgesic tablets or capsules CP-2 Tablets Lanoril Salicylate  Anaprox  Cuprimine Capsules Levenox Salocol  Anexsia-D Dalteparin Magan Salsalate  Anodynos Darvon compound Magnesium Salicylate Sine-off  Ansaid Dasin Capsules Magsal Sodium Salicylate  Anturane Depen Capsules Marnal Soma  APF Arthritis pain formula Dewitt's Pills Measurin  Stanback  Argesic Dia-Gesic Meclofenamic Sulfinpyrazone  Arthritis Bayer Timed Release Aspirin  Diclofenac Meclomen Sulindac  Arthritis pain formula Anacin Dicumarol Medipren  Supac  Analgesic (Safety coated) Arthralgen Diffunasal Mefanamic Suprofen  Arthritis Strength Bufferin Dihydrocodeine Mepro Compound Suprol  Arthropan liquid Dopirydamole Methcarbomol with Aspirin  Synalgos  ASA tablets/Enseals Disalcid Micrainin Tagament  Ascriptin Doan's Midol  Talwin  Ascriptin A/D Dolene Mobidin Tanderil  Ascriptin Extra Strength Dolobid Moblgesic Ticlid  Ascriptin with Codeine Doloprin or Doloprin with Codeine Momentum Tolectin  Asperbuf Duoprin Mono-gesic Trendar  Aspergum Duradyne Motrin  or Motrin  IB Triminicin  Aspirin  plain, buffered or enteric coated  Durasal Myochrisine Trigesic  Aspirin  Suppositories Easprin Nalfon Trillsate  Aspirin  with Codeine Ecotrin Regular or Extra Strength Naprosyn  Uracel  Atromid-S Efficin Naproxen  Ursinus  Auranofin Capsules Elmiron Neocylate Vanquish  Axotal Emagrin Norgesic Verin  Azathioprine Empirin or Empirin with Codeine Normiflo Vitamin E  Azolid Emprazil Nuprin  Voltaren  Bayer Aspirin  plain, buffered or children's or timed BC Tablets or powders Encaprin Orgaran Warfarin Sodium  Buff-a-Comp Enoxaparin Orudis Zorpin  Buff-a-Comp with Codeine Equegesic Os-Cal-Gesic   Buffaprin Excedrin plain, buffered or Extra Strength Oxalid   Bufferin Arthritis Strength Feldene Oxphenbutazone   Bufferin plain or Extra Strength Feldene Capsules Oxycodone  with Aspirin    Bufferin with Codeine Fenoprofen Fenoprofen Pabalate or Pabalate-SF   Buffets II Flogesic Panagesic   Buffinol plain or Extra Strength Florinal or Florinal with Codeine Panwarfarin   Buf-Tabs Flurbiprofen Penicillamine   Butalbital Compound Four-way cold tablets Penicillin   Butazolidin Fragmin Pepto-Bismol   Carbenicillin Geminisyn Percodan   Carna Arthritis Reliever Geopen Persantine   Carprofen Gold's salt Persistin   Chloramphenicol Goody's Phenylbutazone   Chloromycetin Haltrain Piroxlcam   Clmetidine heparin Plaquenil   Cllnoril Hyco-pap Ponstel   Clofibrate Hydroxy chloroquine Propoxyphen         Before stopping any of these medications, be sure to consult the physician who ordered them.  Some, such as Coumadin (Warfarin) are ordered to prevent or treat serious conditions such as "deep thrombosis", "pumonary embolisms", and other heart problems.  The amount of time that you may need off of the medication may also vary with the medication and the reason for which you were taking it.  If you are taking any of these medications, please make sure you notify your pain physician before you undergo any procedures.         Selective Nerve Root  Block Patient Information  Description: Specific nerve roots exit the spinal canal and these nerves can be compressed and inflamed by a bulging disc and bone spurs.  By injecting steroids on the nerve root, we can potentially decrease the inflammation surrounding these nerves, which often leads to decreased pain.  Also, by injecting local anesthesia on the nerve root, this can provide us  helpful information to give to your referring doctor if it decreases your pain.  Selective nerve root blocks can be done along the spine from the neck to the low back depending on the location of your pain.   After numbing the skin with local anesthesia, a small needle is passed to the nerve root and the position of the needle is verified using x-ray pictures.  After the needle is  in correct position, we then deposit the medication.  You may experience a pressure sensation while this is being done.  The entire block usually lasts less than 15 minutes.  Conditions that may be treated with selective nerve root blocks: Low back and leg pain Spinal stenosis Diagnostic block prior to potential surgery Neck and arm pain Post laminectomy syndrome  Preparation for the injection:  Do not eat any solid food or dairy products within 8 hours of your appointment. You may drink clear liquids up to 3 hours before an appointment.  Clear liquids include water , black coffee, juice or soda.  No milk or cream please. You may take your regular medications, including pain medications, with a sip of water  before your appointment.  Diabetics should hold regular insulin (if taken separately) and take 1/2 normal NPH dose the morning of the procedure.  Carry some sugar containing items with you to your appointment. A driver must accompany you and be prepared to drive you home after your procedure. Bring all your current medications with you. An IV may be inserted and sedation may be given at the discretion of the physician. A blood  pressure cuff, EKG, and other monitors will often be applied during the procedure.  Some patients may need to have extra oxygen administered for a short period. You will be asked to provide medical information, including allergies, prior to the procedure.  We must know immediately if you are taking blood  Thinners (like Coumadin) or if you are allergic to IV iodine contrast (dye).  Possible side-effects: All are usually temporary Bleeding from needle site Light headedness Numbness and tingling Decreased blood pressure Weakness in arms/legs Pressure sensation in back/neck Pain at injection site (several days)  Possible complications: All are extremely rare Infection Nerve injury Spinal headache (a headache wore with upright position)  Call if you experience: Fever/chills associated with headache or increased back/neck pain Headache worsened by an upright position New onset weakness or numbness of an extremity below the injection site Hives or difficulty breathing (go to the emergency room) Inflammation or drainage at the injection site(s) Severe back/neck pain greater than usual New symptoms which are concerning to you  Please note:  Although the local anesthetic injected can often make your back or neck feel good for several hours after the injection the pain will likely return.  It takes 3-5 days for steroids to work on the nerve root. You may not notice any pain relief for at least one week.  If effective, we will often do a series of 3 injections spaced 3-6 weeks apart to maximally decrease your pain.    If you have any questions, please call [phone] [facility] Patient Information  Description: The epidural space surrounds the nerves as they exit the spinal cord.  In some patients, the nerves can be compressed and inflamed by a bulging disc or a tight spinal canal (spinal stenosis).  By injecting steroids  into the epidural space, we can bring irritated nerves into direct contact with a potentially helpful medication.  These steroids act directly on the irritated nerves and can reduce swelling and inflammation which often leads to decreased pain.  Epidural steroids may be injected anywhere along the spine and from the neck to the low back depending upon the location of your pain.   After numbing the skin with local anesthetic (like Novocaine), a small needle is passed into the epidural space slowly.  You may experience a  sensation of pressure while this is being done.  The entire block usually last less than 10 minutes.  Conditions which may be treated by epidural steroids:  Low back and leg pain Neck and arm pain Spinal stenosis Post-laminectomy syndrome Herpes zoster (shingles) pain Pain from compression fractures  Preparation for the injection:  Do not eat any solid food or dairy products within 8 hours of your appointment.  You may drink clear liquids up to 3 hours before appointment.  Clear liquids include water , black coffee, juice or soda.  No milk or cream please. You may take your regular medication, including pain medications, with a sip of water  before your appointment  Diabetics should hold regular insulin (if taken separately) and take 1/2 normal NPH dos the morning of the procedure.  Carry some sugar containing items with you to your appointment. A driver must accompany you and be prepared to drive you home after your procedure.  Bring all your current medications with your. An IV may be inserted and sedation may be given at the discretion of the physician.   A blood pressure cuff, EKG and other monitors will often be applied during the procedure.  Some patients may need to have extra oxygen administered for a short period. You will be asked to provide medical information, including your allergies, prior to the procedure.  We must know immediately if you are taking blood thinners  (like Coumadin/Warfarin)  Or if you are allergic to IV iodine contrast (dye). We must know if you could possible be pregnant.  Possible side-effects: Bleeding from needle site Infection (rare, may require surgery) Nerve injury (rare) Numbness & tingling (temporary) Difficulty urinating (rare, temporary) Spinal headache ( a headache worse with upright posture) Light -headedness (temporary) Pain at injection site (several days) Decreased blood pressure (temporary) Weakness in arm/leg (temporary) Pressure sensation in back/neck (temporary)  Call if you experience: Fever/chills associated with headache or increased back/neck pain. Headache worsened by an upright position. New onset weakness or numbness of an extremity below the injection site Hives or difficulty breathing (go to the emergency room) Inflammation or drainage at the infection site Severe back/neck pain Any new symptoms which are concerning to you  Please note:  Although the local anesthetic injected can often make your back or neck feel good for several hours after the injection, the pain will likely return.  It takes 3-7 days for steroids to work in the epidural space.  You may not notice any pain relief for at least that one week.  If effective, we will often do a series of three injections spaced 3-6 weeks apart to maximally decrease your pain.  After the initial series, we generally will wait several months before considering a repeat injection of the same type.  If you have any questions, please call [phone] [facility]

## 2023-08-10 NOTE — Progress Notes (Signed)
 Safety precautions to be maintained throughout the outpatient stay will include: orient to surroundings, keep bed in low position, maintain call bell within reach at all times, provide assistance with transfer out of bed and ambulation.

## 2023-08-10 NOTE — Telephone Encounter (Signed)
 Requested Prescriptions  Pending Prescriptions Disp Refills   rosuvastatin  (CRESTOR ) 20 MG tablet [Pharmacy Med Name: Rosuvastatin  Calcium  20 MG Oral Tablet] 90 tablet 1    Sig: Take 1 tablet by mouth once daily     Cardiovascular:  Antilipid - Statins 2 Failed - 08/10/2023  3:34 PM      Failed - Valid encounter within last 12 months    Recent Outpatient Visits           1 month ago Post herpetic neuralgia   [place] [facility] [place], Sanjuan Crumbly T, NP   2 months ago Post herpetic neuralgia   [place] [facility] [place], [place] T, NP   3 months ago Herpes zoster without complication   [place] [facility] [place], [place] W, NP              Failed - Lipid Panel in normal range within the last 12 months    Cholesterol, Total  Date Value Ref Range Status  02/17/2023 173 100 - 199 mg/dL Final   LDL Chol Calc (NIH)  Date Value Ref Range Status  02/17/2023 86 0 - 99 mg/dL Final   HDL  Date Value Ref Range Status  02/17/2023 70 >39 mg/dL Final   Triglycerides  Date Value Ref Range Status  02/17/2023 94 0 - 149 mg/dL Final         Passed - Cr in normal range and within 360 days    Creatinine, Ser  Date Value Ref Range Status  02/17/2023 0.93 0.57 - 1.00 mg/dL Final         Passed - Patient is not pregnant

## 2023-08-10 NOTE — Progress Notes (Signed)
 PROVIDER NOTE: Interpretation of information contained herein should be left to medically-trained personnel. Specific patient instructions are provided elsewhere under "Patient Instructions" section of medical record. This document was created in part using AI and STT-dictation technology, any transcriptional errors that may result from this process are unintentional.  Patient: Danielle Stephens  Service: E/M   PCP: Lemar Pyles, NP  DOB: April 26, 1950  DOS: 08/10/2023  Provider: Cephus Collin, MD  MRN: [id number]  Delivery: Face-to-face  Specialty: Interventional Pain Management  Type: Established Patient  Setting: Ambulatory outpatient facility  Specialty designation: 09  Referring Prov.: Lemar Pyles, NP  Location: Outpatient office facility       History of present illness (HPI) [name], a 73 y.o. year old female, is here today because of her Cervical radicular pain [M54.12]. [name]'s primary complain today is Neck Pain (Right )  Pertinent problems: [name] has H/O repair of right rotator cuff; Chronic right shoulder pain; History of right shoulder replacement; Right rotator cuff tear arthropathy; Chronic pain syndrome; and Suprascapular entrapment neuropathy of right side on their pertinent problem list.  Pain Assessment: Severity of Chronic pain is reported as a 5 /10. Location: Neck Right/into shoulder and down right arm to the elbow. Onset: More than a month ago. Quality: Discomfort, Stabbing, Constant. Timing: Constant. Modifying factor(s): unable to sit still, keeps moving. Vitals:  height is 5\' 2"  (1.575 m) and weight is 112 lb (50.8 kg). Her temporal temperature is 97.9 F (36.6 C). Her blood pressure is 101/49 (abnormal) and her pulse is 71. Her respiration is 16 and oxygen saturation is 100%.  BMI: Estimated body mass index is 20.49 kg/m as calculated from the following:   Height as of this encounter: 5\' 2"  (1.575 m).   Weight as of this encounter: 112 lb (50.8 kg).  Danielle Stephens is a  73 year old female with chronic shoulder and arm pain who presents with worsening pain.  She experiences chronic shoulder and arm pain that has recently worsened, with radiation from the shoulder down to the elbow. The distribution of the pain is similar to past episodes.  She has previously consulted with specialists who suggested she might be a candidate for surgery, but there were concerns about its efficacy due to her shoulder condition. She recently underwent a bone density test and is awaiting results.  Her last cervical epidural injection was on May 25, 2022, which provided some relief 50% for 3-4 months.  She has also had right suprascapular nerve blocks in the past which she found helpful the last 1 being 01/21/2022.  She is currently taking Lyrica , which was increased from 25 mg to 50 mg, and she takes it twice a day. She previously used gabapentin  but found it made her tired. She also takes trazodone  at night for sleep but did not sleep well last night.   UDS:  Summary  Date Value Ref Range Status  12/23/2020 Note  Final    Comment:    ==================================================================== Compliance Drug Analysis, Ur ==================================================================== Test                             Result       Flag       Units  Drug Present and Declared for Prescription Verification   Tramadol                        290  EXPECTED   ng/mg creat   O-Desmethyltramadol            500          EXPECTED   ng/mg creat    Source of tramadol  is a prescription medication. O-desmethyltramadol    is an expected metabolite of tramadol .    Acetaminophen                   PRESENT      EXPECTED  Drug Present not Declared for Prescription Verification   Diphenhydramine                PRESENT      UNEXPECTED  Drug Absent but Declared for Prescription Verification   Oxycodone                       Not Detected UNEXPECTED ng/mg creat   Tizanidine                       Not Detected UNEXPECTED    Tizanidine , as indicated in the declared medication list, is not    always detected even when used as directed.    Methocarbamol                   Not Detected UNEXPECTED   Doxylamine                     Not Detected UNEXPECTED ==================================================================== Test                      Result    Flag   Units      Ref Range   Creatinine              30               mg/dL      >=34 ==================================================================== Declared Medications:  The flagging and interpretation on this report are based on the  following declared medications.  Unexpected results may arise from  inaccuracies in the declared medications.   **Note: The testing scope of this panel includes these medications:   Doxylamine  Methocarbamol  (Robaxin )  Oxycodone  (Roxicodone )  Tramadol  (Ultram )   **Note: The testing scope of this panel does not include small to  moderate amounts of these reported medications:   Acetaminophen  (Tylenol )  Tizanidine  (Zanaflex )   **Note: The testing scope of this panel does not include the  following reported medications:   Biotin  Fexofenadine  (Allegra )  Lisinopril  (Zestril )  Melatonin  Meloxicam  (Mobic )  Multivitamin  Ondansetron  (Zofran )  Vitamin C  Vitamin D  ==================================================================== For clinical consultation, please call [phone]. ====================================================================    CLINICAL DATA:  Right arm radiculopathy   EXAM: MRI CERVICAL SPINE WITHOUT CONTRAST   TECHNIQUE: Multiplanar, multisequence MR imaging of the cervical spine was performed. No intravenous contrast was administered.   COMPARISON:  Cervical spine radiographs 02/04/2022   FINDINGS: Alignment: Normal   Vertebrae: Negative for fracture or mass   Cord: Normal signal and morphology   Posterior Fossa, vertebral  arteries, paraspinal tissues: No significant abnormality. Small bilateral perineural cysts around the nerve roots at C5-6, C6-7, C7-T1.   Disc levels:   C2-3: Negative   C3-4: Negative   C4-5: Mild disc and mild facet degeneration. Mild central canal stenosis and mild foraminal stenosis on the left.   C4-5: Disc degeneration with diffuse uncinate spurring. Right paracentral disc protrusion and spurring with cord flattening on  the right. Mild spinal stenosis. Neural foramina patent bilaterally   C5-6: Disc degeneration with mild spurring. No significant spinal or foraminal stenosis   C7-T1: Negative   IMPRESSION: 1. Mild central canal stenosis and mild foraminal stenosis on the left at C4-5. 2. Right paracentral disc protrusion and spurring at C5-6 with cord flattening on the right.     Electronically Signed   By: Anastasio Balsam M.D.   On: 03/25/2022 11:43    ROS  Constitutional: Denies any fever or chills Gastrointestinal: No reported hemesis, hematochezia, vomiting, or acute GI distress Musculoskeletal: Denies any acute onset joint swelling, redness, loss of ROM, or weakness Neurological: Right shoulder and right arm pain  Medication Review  Biotin, Omega-3 Fatty Acids, Vitamin D , acetaminophen , amLODipine , cyanocobalamin, lactase, lisinopril , methocarbamol , multivitamin, pregabalin , rosuvastatin , and traZODone   History Review  Allergy: Ms. Elsasser is allergic to lactose intolerance (gi), codeine, and tramadol . Drug: Ms. Bedore  reports no history of drug use. Alcohol:  reports no history of alcohol use. Tobacco:  reports that she has never smoked. She has never used smokeless tobacco. Social: Ms. Olesen  reports that she has never smoked. She has never used smokeless tobacco. She reports that she does not drink alcohol and does not use drugs. Medical:  has a past medical history of Anxiety, Chronic kidney disease, Hypertension, and Shoulder pain. Surgical: Ms. Bertoni  has a  past surgical history that includes Dilation and curettage, diagnostic / therapeutic; rotary cuff (Right, 03/06/2019); Shoulder arthroscopy (Right, 08/28/2019); Closed manipulation shoulder with steroid injection (Right, 08/28/2019); Colonoscopy; Reverse shoulder arthroplasty (Right, 05/20/2020); Colonoscopy with propofol  (N/A, 09/23/2021); and polypectomy (N/A, 09/23/2021). Family: family history includes Cancer in her mother; Down syndrome in her brother; Heart attack in her brother and father; Hypertension in her father, mother, sister, sister, sister, sister, sister, and sister; Stroke in her sister.  Laboratory Chemistry Profile   Renal Lab Results  Component Value Date   BUN 20 02/17/2023   CREATININE 0.93 02/17/2023   BCR 22 02/17/2023   GFRAA 71 02/16/2020   GFRNONAA >60 05/16/2020    Hepatic Lab Results  Component Value Date   AST 29 02/17/2023   ALT 16 02/17/2023   ALBUMIN 4.5 02/17/2023   ALKPHOS 67 02/17/2023    Electrolytes Lab Results  Component Value Date   NA 138 02/17/2023   K 4.8 02/17/2023   CL 99 02/17/2023   CALCIUM  9.4 02/17/2023    Bone Lab Results  Component Value Date   VD25OH 44.0 08/04/2022    Inflammation (CRP: Acute Phase) (ESR: Chronic Phase) Lab Results  Component Value Date   CRP <1 04/14/2021   ESRSEDRATE 8 04/14/2021         Note: Above Lab results reviewed.  Recent Imaging Review  DG Knee Complete 4 Views Left CLINICAL DATA:  Fall last week with bilateral shoulder pain, right lateral chest wall pain and left knee pain.  EXAM: LEFT SHOULDER - 2+ VIEW; RIGHT SHOULDER - 2+ VIEW; LEFT KNEE - COMPLETE 4+ VIEW; RIGHT RIBS - 2 VIEW  COMPARISON:  Shoulder radiographs 12/23/2021. Right shoulder CT 02/13/2022.  FINDINGS: Right shoulder: Status post reverse arthroplasty. The hardware appears intact, without loosening. No evidence of acute fracture or dislocation. Grossly stable postsurgical changes at the acromioclavicular joint.  Left  shoulder: The mineralization and alignment are normal. There is no evidence of acute fracture or dislocation. Mild acromioclavicular and glenohumeral degenerative changes. No acute soft tissue findings are identified. Calcified granulomas are noted within the left  lung.  Right ribs: No evidence of acute right-sided rib fracture or focal rib lesion. Stable right apical scarring. The right lung is otherwise clear. No pleural effusion or pneumothorax. Minimal thoracic scoliosis.  Left knee: The mineralization and alignment are normal. There is no evidence of acute fracture or dislocation. Minimal patellofemoral joint space narrowing. No significant joint effusion. No focal soft tissue abnormality or foreign body identified.  IMPRESSION: 1. No evidence of acute right-sided rib fracture, pleural effusion or pneumothorax. 2. No evidence of acute fracture or dislocation at either shoulder. Status post right shoulder reverse arthroplasty without evidence of complication. 3. No acute osseous findings at the left knee.  Electronically Signed   By: Elmon Hagedorn M.D.   On: 12/21/2022 14:41 DG Ribs Unilateral Right CLINICAL DATA:  Fall last week with bilateral shoulder pain, right lateral chest wall pain and left knee pain.  EXAM: LEFT SHOULDER - 2+ VIEW; RIGHT SHOULDER - 2+ VIEW; LEFT KNEE - COMPLETE 4+ VIEW; RIGHT RIBS - 2 VIEW  COMPARISON:  Shoulder radiographs 12/23/2021. Right shoulder CT 02/13/2022.  FINDINGS: Right shoulder: Status post reverse arthroplasty. The hardware appears intact, without loosening. No evidence of acute fracture or dislocation. Grossly stable postsurgical changes at the acromioclavicular joint.  Left shoulder: The mineralization and alignment are normal. There is no evidence of acute fracture or dislocation. Mild acromioclavicular and glenohumeral degenerative changes. No acute soft tissue findings are identified. Calcified granulomas are noted within  the left lung.  Right ribs: No evidence of acute right-sided rib fracture or focal rib lesion. Stable right apical scarring. The right lung is otherwise clear. No pleural effusion or pneumothorax. Minimal thoracic scoliosis.  Left knee: The mineralization and alignment are normal. There is no evidence of acute fracture or dislocation. Minimal patellofemoral joint space narrowing. No significant joint effusion. No focal soft tissue abnormality or foreign body identified.  IMPRESSION: 1. No evidence of acute right-sided rib fracture, pleural effusion or pneumothorax. 2. No evidence of acute fracture or dislocation at either shoulder. Status post right shoulder reverse arthroplasty without evidence of complication. 3. No acute osseous findings at the left knee.  Electronically Signed   By: Elmon Hagedorn M.D.   On: 12/21/2022 14:41 DG Shoulder Right CLINICAL DATA:  Fall last week with bilateral shoulder pain, right lateral chest wall pain and left knee pain.  EXAM: LEFT SHOULDER - 2+ VIEW; RIGHT SHOULDER - 2+ VIEW; LEFT KNEE - COMPLETE 4+ VIEW; RIGHT RIBS - 2 VIEW  COMPARISON:  Shoulder radiographs 12/23/2021. Right shoulder CT 02/13/2022.  FINDINGS: Right shoulder: Status post reverse arthroplasty. The hardware appears intact, without loosening. No evidence of acute fracture or dislocation. Grossly stable postsurgical changes at the acromioclavicular joint.  Left shoulder: The mineralization and alignment are normal. There is no evidence of acute fracture or dislocation. Mild acromioclavicular and glenohumeral degenerative changes. No acute soft tissue findings are identified. Calcified granulomas are noted within the left lung.  Right ribs: No evidence of acute right-sided rib fracture or focal rib lesion. Stable right apical scarring. The right lung is otherwise clear. No pleural effusion or pneumothorax. Minimal thoracic scoliosis.  Left knee: The mineralization  and alignment are normal. There is no evidence of acute fracture or dislocation. Minimal patellofemoral joint space narrowing. No significant joint effusion. No focal soft tissue abnormality or foreign body identified.  IMPRESSION: 1. No evidence of acute right-sided rib fracture, pleural effusion or pneumothorax. 2. No evidence of acute fracture or dislocation at either shoulder. Status post right  shoulder reverse arthroplasty without evidence of complication. 3. No acute osseous findings at the left knee.  Electronically Signed   By: Elmon Hagedorn M.D.   On: 12/21/2022 14:41 DG Shoulder Left CLINICAL DATA:  Fall last week with bilateral shoulder pain, right lateral chest wall pain and left knee pain.  EXAM: LEFT SHOULDER - 2+ VIEW; RIGHT SHOULDER - 2+ VIEW; LEFT KNEE - COMPLETE 4+ VIEW; RIGHT RIBS - 2 VIEW  COMPARISON:  Shoulder radiographs 12/23/2021. Right shoulder CT 02/13/2022.  FINDINGS: Right shoulder: Status post reverse arthroplasty. The hardware appears intact, without loosening. No evidence of acute fracture or dislocation. Grossly stable postsurgical changes at the acromioclavicular joint.  Left shoulder: The mineralization and alignment are normal. There is no evidence of acute fracture or dislocation. Mild acromioclavicular and glenohumeral degenerative changes. No acute soft tissue findings are identified. Calcified granulomas are noted within the left lung.  Right ribs: No evidence of acute right-sided rib fracture or focal rib lesion. Stable right apical scarring. The right lung is otherwise clear. No pleural effusion or pneumothorax. Minimal thoracic scoliosis.  Left knee: The mineralization and alignment are normal. There is no evidence of acute fracture or dislocation. Minimal patellofemoral joint space narrowing. No significant joint effusion. No focal soft tissue abnormality or foreign body identified.  IMPRESSION: 1. No evidence of acute  right-sided rib fracture, pleural effusion or pneumothorax. 2. No evidence of acute fracture or dislocation at either shoulder. Status post right shoulder reverse arthroplasty without evidence of complication. 3. No acute osseous findings at the left knee.  Electronically Signed   By: Elmon Hagedorn M.D.   On: 12/21/2022 14:41 Note: Reviewed         Physical Exam  General appearance: Well nourished, well developed, and well hydrated. In no apparent acute distress Mental status: Alert, oriented x 3 (person, place, & time)       Respiratory: No evidence of acute respiratory distress Eyes: PERLA Vitals: BP (!) 101/49 (BP Location: Left Arm, Patient Position: Sitting, Cuff Size: Normal)   Pulse 71   Temp 97.9 F (36.6 C) (Temporal)   Resp 16   Ht 5\' 2"  (1.575 m)   Wt 112 lb (50.8 kg)   SpO2 100%   BMI 20.49 kg/m  BMI: Estimated body mass index is 20.49 kg/m as calculated from the following:   Height as of this encounter: 5\' 2"  (1.575 m).   Weight as of this encounter: 112 lb (50.8 kg). Ideal: Ideal body weight: 50.1 kg (110 lb 7.2 oz) Adjusted ideal body weight: 50.4 kg (111 lb 1.1 oz)  Cervical Spine Area Exam  Skin & Axial Inspection: No masses, redness, edema, swelling, or associated skin lesions Alignment: Symmetrical Functional ROM: Unrestricted ROM      Stability: No instability detected Muscle Tone/Strength: Functionally intact. No obvious neuro-muscular anomalies detected. Sensory (Neurological): Referred pain pattern Palpation: No palpable anomalies             Upper Extremity (UE) Exam      Side: Right upper extremity   Side: Left upper extremity  Skin & Extremity Inspection: Skin color, temperature, and hair growth are WNL. No peripheral edema or cyanosis. No masses, redness, swelling, asymmetry, or associated skin lesions. No contractures.   Skin & Extremity Inspection: Skin color, temperature, and hair growth are WNL. No peripheral edema or cyanosis. No masses,  redness, swelling, asymmetry, or associated skin lesions. No contractures.  Functional ROM: Pain restricted ROM for shoulder and elbow   Functional ROM:  Unrestricted ROM          Muscle Tone/Strength: Functionally intact. No obvious neuro-muscular anomalies detected.   Muscle Tone/Strength: Functionally intact. No obvious neuro-muscular anomalies detected.  Sensory (Neurological): Neurogenic pain pattern           Sensory (Neurological): Unimpaired          Palpation: No palpable anomalies               Palpation: No palpable anomalies              Provocative Test(s):  Phalen's test: deferred Tinel's test: deferred Apley's scratch test (touch opposite shoulder):  Action 1 (Across chest): Decreased ROM Action 2 (Overhead): Decreased ROM Action 3 (LB reach): Decreased ROM     Provocative Test(s):  Phalen's test: deferred Tinel's test: deferred Apley's scratch test (touch opposite shoulder):  Action 1 (Across chest): deferred Action 2 (Overhead): deferred Action 3 (LB reach): deferred    Assessment   Diagnosis  1. Cervical radicular pain   2. Herniation of cervical intervertebral disc with radiculopathy   3. Chronic right shoulder pain   4. Right rotator cuff tear arthropathy   5. History of right shoulder replacement   6. Chronic pain syndrome        Plan of Care   Chronic pain in shoulder and arm due to cervical radiculopathy, right suprascapular nerve entrapment Chronic pain in the shoulder and arm has worsened over the past year, with radiation to the elbow.  Previously received right cervical epidural steroid injection and right suprascapular nerve block.  Current management includes Lyrica , which is better tolerated than gabapentin . Order a cervical injection and nerve block in the shoulder to manage pain before her mission trip. Continue Lyrica  50 mg twice a day. If not tolerated during the day, take 100 mg at night. Advise that Lyrica  may cause sedation at higher doses,  potentially reducing the need for trazodone  for sleep. Schedule the procedure for June 4th, before her mission trip on July 14th.  Medication management for pain   Transitioned from gabapentin  to Lyrica  due to tiredness with gabapentin . Lyrica  dose increased from 25 mg to 50 mg due to insufficient pain relief at the lower dose. She is also taking trazodone  for sleep, but may not need it if Lyrica  causes sufficient sedation. Monitor for sedation effects of Lyrica , which may reduce the need for trazodone .  Orders:  Orders Placed This Encounter  Procedures   Cervical Epidural Injection    Standing Status:   Future    Expected Date:   08/24/2023    Expiration Date:   11/10/2023    Scheduling Instructions:     Right C-ESI    Where will this procedure be performed?:   [facility]   SUPRASCAPULAR NERVE BLOCK    For shoulder pain.    Standing Status:   Future    Expected Date:   08/24/2023    Expiration Date:   11/10/2023    Scheduling Instructions:     Purpose: RIGHT     Level(s): Suprascapular notch     Sedation: without     Scheduling Timeframe: As permitted by the schedule    Where will this procedure be performed?:   [facility]     Right SSNB 01/06/21, 08/06/21 , 10/08/21, 01/21/22; C-ESI 05/25/22        Return in about 8 days (around 08/18/2023) for Right C-ESI + Right SSNB, in clinic [facility].    Recent Visits No  visits were found meeting these conditions. Showing recent visits within past 90 days and meeting all other requirements Today's Visits Date Type Provider Dept  08/10/23 Office Visit Cephus Collin, MD [facility]  Showing today's visits and meeting all other requirements Future Appointments No visits were found meeting these conditions. Showing future appointments within next 90 days and meeting all other requirements  I discussed the assessment and treatment plan with the patient. The patient was provided an opportunity to ask questions and all  were answered. The patient agreed with the plan and demonstrated an understanding of the instructions.  Patient advised to call back or seek an in-person evaluation if the symptoms or condition worsens.  Duration of encounter: .  Total time on encounter, as per AMA guidelines included both the face-to-face and non-face-to-face time personally spent by the physician and/or other qualified health care professional(s) on the day of the encounter (includes time in activities that require the physician or other qualified health care professional and does not include time in activities normally performed by clinical staff). Physician's time may include the following activities when performed: Preparing to see the patient (e.g., pre-charting review of records, searching for previously ordered imaging, lab work, and nerve conduction tests) Review of prior analgesic pharmacotherapies. Reviewing PMP Interpreting ordered tests (e.g., lab work, imaging, nerve conduction tests) Performing post-procedure evaluations, including interpretation of diagnostic procedures Obtaining and/or reviewing separately obtained history Performing a medically appropriate examination and/or evaluation Counseling and educating the patient/family/caregiver Ordering medications, tests, or procedures Referring and communicating with other health care professionals (when not separately reported) Documenting clinical information in the electronic or other health record Independently interpreting results (not separately reported) and communicating results to the patient/ family/caregiver Care coordination (not separately reported)  Note by: Cephus Collin, MD (TTS and AI technology used. I apologize for any typographical errors that were not detected and corrected.) Date: 08/10/2023; Time: 10:06 AM

## 2023-08-12 ENCOUNTER — Ambulatory Visit (INDEPENDENT_AMBULATORY_CARE_PROVIDER_SITE_OTHER): Admitting: Nurse Practitioner

## 2023-08-12 ENCOUNTER — Ambulatory Visit: Payer: Self-pay | Admitting: Nurse Practitioner

## 2023-08-12 ENCOUNTER — Encounter: Payer: Self-pay | Admitting: Nurse Practitioner

## 2023-08-12 VITALS — BP 112/67 | HR 69 | Temp 97.7°F | Ht 61.1 in | Wt 111.2 lb

## 2023-08-12 DIAGNOSIS — Z0000 Encounter for general adult medical examination without abnormal findings: Secondary | ICD-10-CM | POA: Diagnosis not present

## 2023-08-12 DIAGNOSIS — G4709 Other insomnia: Secondary | ICD-10-CM | POA: Diagnosis not present

## 2023-08-12 DIAGNOSIS — I10 Essential (primary) hypertension: Secondary | ICD-10-CM | POA: Diagnosis not present

## 2023-08-12 DIAGNOSIS — E782 Mixed hyperlipidemia: Secondary | ICD-10-CM | POA: Diagnosis not present

## 2023-08-12 DIAGNOSIS — M8588 Other specified disorders of bone density and structure, other site: Secondary | ICD-10-CM

## 2023-08-12 DIAGNOSIS — G894 Chronic pain syndrome: Secondary | ICD-10-CM

## 2023-08-12 LAB — MICROALBUMIN, URINE WAIVED
Creatinine, Urine Waived: 50 mg/dL (ref 10–300)
Microalb, Ur Waived: 30 mg/L — ABNORMAL HIGH (ref 0–19)

## 2023-08-12 MED ORDER — PREGABALIN 50 MG PO CAPS: ORAL_CAPSULE | ORAL | 4 refills | Status: DC

## 2023-08-12 MED ORDER — ROSUVASTATIN CALCIUM 20 MG PO TABS: 20.0000 mg | ORAL_TABLET | Freq: Every day | ORAL | 1 refills | Status: AC

## 2023-08-12 MED ORDER — AMLODIPINE BESYLATE 2.5 MG PO TABS: 2.5000 mg | ORAL_TABLET | Freq: Every day | ORAL | 4 refills | Status: AC

## 2023-08-12 MED ORDER — LISINOPRIL 20 MG PO TABS: ORAL_TABLET | ORAL | 4 refills | Status: AC

## 2023-08-12 NOTE — Assessment & Plan Note (Signed)
 Chronic, ongoing.  Continue to collaborate with Dr. Rhesa Celeste at pain clinic, appreciate their input.  Will increase Lyrica  to 1 capsule in the morning and 2 capsules at night.  She is aware to stop Trazodone  with this change.

## 2023-08-12 NOTE — Assessment & Plan Note (Signed)
Chronic, ongoing, taking Crestor daily and tolerating.  Continue this medication regimen and adjust as needed.  Lipid panel and CMP today. 

## 2023-08-12 NOTE — Assessment & Plan Note (Signed)
 Chronic.  Noted on DEXA 12/07/19 with T score -2.2.  Continue daily Vitamin D  and check level today. Waiting on DEXA results, performed 08/06/23, to see if cervical spine surgery is option for pain.

## 2023-08-12 NOTE — Progress Notes (Signed)
 BP 112/67   Pulse 69   Temp 97.7 F (36.5 C) (Oral)   Ht 5' 1.1" (1.552 m)   Wt 111 lb 3.2 oz (50.4 kg)   SpO2 98%   BMI 20.94 kg/m    Subjective:    Patient ID: Danielle Stephens, female    DOB: [birth year], 74 y.o.   MRN: [id number]  HPI: Danielle Stephens is a 73 y.o. female presenting on 08/12/2023 for comprehensive medical examination. Current medical complaints include:none  She currently lives with: husband Menopausal Symptoms: no  HYPERTENSION & HLD Taking Lisinopril  20 MG daily, Amlodipine  2.5 MG, Rosuvastatin  20 MG. Hypertension status: stable  Satisfied with current treatment? yes Duration of hypertension: chronic BP monitoring frequency:sometimes BP range:  BP medication side effects:  no Medication compliance: good compliance Previous BP meds: Lisinopril  Aspirin : no Recurrent headaches: no Visual changes: no Palpitations: no Dyspnea: no Chest pain: no Lower extremity edema: occasional due to varicose veins Dizzy/lightheaded: no  The 10-year ASCVD risk score (Arnett DK, et al., 2019) is: 8.9%   Values used to calculate the score:     Age: 12 years     Sex: Female     Is Non-Hispanic African American: No     Diabetic: No     Tobacco smoker: No     Systolic Blood Pressure: 112 mmHg     Is BP treated: Yes     HDL Cholesterol: 70 mg/dL     Total Cholesterol: 173 mg/dL    INSOMNIA & CHRONIC PAIN Taking Lyrica  50 MG BID & Robaxin  -- pain clinic recommended two Lyrica  at night and getting rid of Trazodone .  Pain clinic with last visit 08/10/23, is going to get epidural.  Has chronic right shoulder and neck pain.  Does wake-up every night, when this happens will get 4-5 hours of sleep.   Duration: chronic Satisfied with sleep quality: no Difficulty falling asleep: no Difficulty staying asleep: yes Waking a few hours after sleep onset: yes Early morning awakenings: no Daytime hypersomnolence: no Wakes feeling refreshed: no Good sleep hygiene: yes Apnea: no Snoring:  no Depressed/anxious mood: no Recent stress: no Restless legs/nocturnal leg cramps: no Chronic pain/arthritis: yes History of sleep study: no Treatments attempted: Melatonin, Zquil, Trazodone     08/12/2023   10:09 AM 08/10/2023    9:21 AM 06/21/2023    1:41 PM 05/21/2023    9:54 AM 02/17/2023    1:18 PM  Depression screen PHQ 2/9  Decreased Interest 0 0 0 0 0  Down, Depressed, Hopeless 0 0 0 0 0  PHQ - 2 Score 0 0 0 0 0  Altered sleeping 0  0 1 0  Tired, decreased energy 0  0 0 0  Change in appetite 0  0 0 0  Feeling bad or failure about yourself  0  0 0 0  Trouble concentrating 0  0 0 0  Moving slowly or fidgety/restless 0  0 0 0  Suicidal thoughts 0  0 0 0  PHQ-9 Score 0  0 1 0  Difficult doing work/chores Not difficult at all  Not difficult at all Somewhat difficult Not difficult at all       08/12/2023   10:10 AM 06/21/2023    1:41 PM 05/21/2023    9:54 AM 02/17/2023    1:18 PM  GAD 7 : Generalized Anxiety Score  Nervous, Anxious, on Edge 0 0 0 0  Control/stop worrying 0 0 0 0  Worry too much - different  things 0 0 0 0  Trouble relaxing 0 0 0 0  Restless 0 0 0 0  Easily annoyed or irritable 0 0 0 0  Afraid - awful might happen 0 0 0 0  Total GAD 7 Score 0 0 0 0  Anxiety Difficulty Not difficult at all Not difficult at all Not difficult at all Not difficult at all      02/17/2023    1:17 PM 05/21/2023    9:53 AM 06/21/2023    1:41 PM 08/10/2023    9:21 AM 08/12/2023   10:09 AM  Fall Risk  Falls in the past year? 0 0 0 0 0  Was there an injury with Fall? 0 0 0  0  Fall Risk Category Calculator 0 0 0  0  Patient at Risk for Falls Due to No Fall Risks No Fall Risks No Fall Risks  No Fall Risks  Fall risk Follow up Falls evaluation completed Falls evaluation completed Falls evaluation completed Falls evaluation completed Falls evaluation completed    Functional Status Survey: Is the patient deaf or have difficulty hearing?: No Does the patient have difficulty seeing, even when  wearing glasses/contacts?: No Does the patient have difficulty concentrating, remembering, or making decisions?: No Does the patient have difficulty walking or climbing stairs?: No Does the patient have difficulty dressing or bathing?: No Does the patient have difficulty doing errands alone such as visiting a doctor's office or shopping?: No    Past Medical History:  Past Medical History:  Diagnosis Date   Anxiety    Chronic kidney disease    STAGE 3   Hypertension    Shoulder pain     Surgical History:  Past Surgical History:  Procedure Laterality Date   CLOSED MANIPULATION SHOULDER WITH STERIOD INJECTION Right 08/28/2019   Procedure: MANIPULATION SHOULDER WITH STEROID INJECTION x2;  Surgeon: Lorri Rota, MD;  Location: [facility];  Service: Orthopedics;  Laterality: Right;   COLONOSCOPY     COLONOSCOPY WITH PROPOFOL  N/A 09/23/2021   Procedure: COLONOSCOPY WITH PROPOFOL ;  Surgeon: Selena Daily, MD;  Location: [facility];  Service: Endoscopy;  Laterality: N/A;  ONE ASCENDING COLON POLYP NOT RETRIEVED   DILATION AND CURETTAGE, DIAGNOSTIC / THERAPEUTIC     POLYPECTOMY N/A 09/23/2021   Procedure: POLYPECTOMY;  Surgeon: Selena Daily, MD;  Location: [facility];  Service: Endoscopy;  Laterality: N/A;   REVERSE SHOULDER ARTHROPLASTY Right 05/20/2020   Procedure: Right reverse shoulder arthroplasty, biceps tenodesis;  Surgeon: Lorri Rota, MD;  Location: [facility];  Service: Orthopedics;  Laterality: Right;   rotary cuff Right 03/06/2019   SHOULDER ARTHROSCOPY Right 08/28/2019   Procedure: Right shoulder arthroscopic capsular release, lysis of adhesion,;  Surgeon: Lorri Rota, MD;  Location: [facility];  Service: Orthopedics;  Laterality: Right;    Medications:  Current Outpatient Medications on File Prior to Visit  Medication Sig   acetaminophen  (TYLENOL ) 500 MG tablet Take 1,000 mg by mouth every 6 (six) hours as needed for mild pain.   Biotin 1000 MCG tablet  Take 1,000 mcg by mouth daily.   Cholecalciferol (VITAMIN D ) 50 MCG (2000 UT) CAPS Take 2,000 Units by mouth daily. gummie   cyanocobalamin (VITAMIN B12) 1000 MCG tablet Take 1,000 mcg by mouth daily.   lactase (LACTAID) 3000 units tablet Take 1 tablet by mouth 3 (three) times daily with meals.   methocarbamol  (ROBAXIN ) 500 MG tablet Take 1 tablet (500 mg total) by mouth every 8 (eight) hours as needed  for muscle spasms.   Multiple Vitamin (MULTIVITAMIN) capsule Take 1 capsule by mouth daily.   Omega-3 Fatty Acids (OMEGA 3 500 PO) Take 1,000 mg by mouth daily at 2 PM.   traZODone  (DESYREL ) 100 MG tablet Take 1 tablet (100 mg total) by mouth at bedtime as needed for sleep.   No current facility-administered medications on file prior to visit.    Allergies:  Allergies  Allergen Reactions   Lactose Intolerance (Gi) Diarrhea   Codeine Rash   Tramadol  Hives    Social History:  Social History   Socioeconomic History   Marital status: Married    Spouse name: Currie Douse   Number of children: 2   Years of education: Not on file   Highest education level: Not on file  Occupational History   Occupation: retired  Tobacco Use   Smoking status: Never   Smokeless tobacco: Never  Vaping Use   Vaping status: Never Used  Substance and Sexual Activity   Alcohol use: No   Drug use: No   Sexual activity: Yes  Other Topics Concern   Not on file  Social History Narrative   Married, still works as a IT sales professional, has 1 biological son and 1 adopted daughter who lives in [place]   Social Drivers of Health   Financial Resource Strain: Low Risk  (06/17/2023)   Overall Financial Resource Strain (CARDIA)    Difficulty of Paying Living Expenses: Not hard at all  Food Insecurity: No Food Insecurity (06/17/2023)   Hunger Vital Sign    Worried About Running Out of Food in the Last Year: Never true    Ran Out of Food in the Last Year: Never true  Transportation Needs: No Transportation Needs (06/17/2023)    PRAPARE - Administrator, Civil Service (Medical): No    Lack of Transportation (Non-Medical): No  Physical Activity: Sufficiently Active (06/17/2023)   Exercise Vital Sign    Days of Exercise per Week: 7 days    Minutes of Exercise per Session: 30 min  Stress: No Stress Concern Present (06/17/2023)   [employer] of Occupational Health - Occupational Stress Questionnaire    Feeling of Stress : Not at all  Social Connections: Socially Integrated (06/17/2023)   Social Connection and Isolation Panel [NHANES]    Frequency of Communication with Friends and Family: Three times a week    Frequency of Social Gatherings with Friends and Family: Three times a week    Attends Religious Services: More than 4 times per year    Active Member of Clubs or Organizations: Yes    Attends Banker Meetings: More than 4 times per year    Marital Status: Married  Catering manager Violence: Not At Risk (12/08/2022)   Humiliation, Afraid, Rape, and Kick questionnaire    Fear of Current or Ex-Partner: No    Emotionally Abused: No    Physically Abused: No    Sexually Abused: No   Social History   Tobacco Use  Smoking Status Never  Smokeless Tobacco Never   Social History   Substance and Sexual Activity  Alcohol Use No    Family History:  Family History  Problem Relation Age of Onset   Hypertension Mother    Cancer Mother    Hypertension Father    Heart attack Father    Stroke Sister    Hypertension Sister    Heart attack Brother    Hypertension Sister    Hypertension Sister    Hypertension  Sister    Hypertension Sister    Hypertension Sister    Down syndrome Brother    Breast cancer Neg Hx     Past medical history, surgical history, medications, allergies, family history and social history reviewed with patient today and changes made to appropriate areas of the chart.   ROS All other ROS negative except what is listed above and in the HPI.      Objective:     BP 112/67   Pulse 69   Temp 97.7 F (36.5 C) (Oral)   Ht 5' 1.1" (1.552 m)   Wt 111 lb 3.2 oz (50.4 kg)   SpO2 98%   BMI 20.94 kg/m   Wt Readings from Last 3 Encounters:  08/12/23 111 lb 3.2 oz (50.4 kg)  08/10/23 112 lb (50.8 kg)  06/21/23 112 lb 6.4 oz (51 kg)    Physical Exam Vitals and nursing note reviewed. Exam conducted with a chaperone present.  Constitutional:      General: She is awake. She is not in acute distress.    Appearance: She is well-developed and well-groomed. She is not ill-appearing or toxic-appearing.  HENT:     Head: Normocephalic and atraumatic.     Right Ear: Hearing, tympanic membrane, ear canal and external ear normal. No drainage.     Left Ear: Hearing, tympanic membrane, ear canal and external ear normal. No drainage.     Nose: Nose normal.     Right Sinus: No maxillary sinus tenderness or frontal sinus tenderness.     Left Sinus: No maxillary sinus tenderness or frontal sinus tenderness.     Mouth/Throat:     Mouth: Mucous membranes are moist.     Pharynx: Oropharynx is clear. Uvula midline. No pharyngeal swelling, oropharyngeal exudate or posterior oropharyngeal erythema.  Eyes:     General: Lids are normal.        Right eye: No discharge.        Left eye: No discharge.     Extraocular Movements: Extraocular movements intact.     Conjunctiva/sclera: Conjunctivae normal.     Pupils: Pupils are equal, round, and reactive to light.     Visual Fields: Right eye visual fields normal and left eye visual fields normal.  Neck:     Thyroid : No thyromegaly.     Vascular: No carotid bruit.     Trachea: Trachea normal.  Cardiovascular:     Rate and Rhythm: Normal rate and regular rhythm.     Heart sounds: Normal heart sounds. No murmur heard.    No gallop.  Pulmonary:     Effort: Pulmonary effort is normal. No accessory muscle usage or respiratory distress.     Breath sounds: Normal breath sounds.  Chest:  Breasts:    Right: Normal.      Left: Normal.  Abdominal:     General: Bowel sounds are normal.     Palpations: Abdomen is soft. There is no hepatomegaly or splenomegaly.     Tenderness: There is no abdominal tenderness.  Musculoskeletal:        General: Normal range of motion.     Cervical back: Normal range of motion and neck supple.     Right lower leg: No edema.     Left lower leg: No edema.  Lymphadenopathy:     Head:     Right side of head: No submental, submandibular, tonsillar, preauricular or posterior auricular adenopathy.     Left side of head: No submental, submandibular, tonsillar,  preauricular or posterior auricular adenopathy.     Cervical: No cervical adenopathy.     Upper Body:     Right upper body: No supraclavicular, axillary or pectoral adenopathy.     Left upper body: No supraclavicular, axillary or pectoral adenopathy.  Skin:    General: Skin is warm and dry.     Capillary Refill: Capillary refill takes less than 2 seconds.     Findings: No rash.  Neurological:     Mental Status: She is alert and oriented to person, place, and time.     Gait: Gait is intact.     Deep Tendon Reflexes: Reflexes are normal and symmetric.     Reflex Scores:      Brachioradialis reflexes are 2+ on the right side and 2+ on the left side.      Patellar reflexes are 2+ on the right side and 2+ on the left side. Psychiatric:        Attention and Perception: Attention normal.        Mood and Affect: Mood normal.        Speech: Speech normal.        Behavior: Behavior normal. Behavior is cooperative.        Thought Content: Thought content normal.        Judgment: Judgment normal.     Results for orders placed or performed in visit on 02/17/23  Comprehensive metabolic panel   Collection Time: 02/17/23  1:53 PM  Result Value Ref Range   Glucose 87 70 - 99 mg/dL   BUN 20 8 - 27 mg/dL   Creatinine, Ser 8.29 0.57 - 1.00 mg/dL   eGFR 66 >56 OZ/HYQ/6.57   BUN/Creatinine Ratio 22 12 - 28   Sodium 138 134 - 144  mmol/L   Potassium 4.8 3.5 - 5.2 mmol/L   Chloride 99 96 - 106 mmol/L   CO2 26 20 - 29 mmol/L   Calcium  9.4 8.7 - 10.3 mg/dL   Total Protein 7.1 6.0 - 8.5 g/dL   Albumin 4.5 3.9 - 4.9 g/dL   Globulin, Total 2.6 1.5 - 4.5 g/dL   Bilirubin Total 0.4 0.0 - 1.2 mg/dL   Alkaline Phosphatase 67 44 - 121 IU/L   AST 29 0 - 40 IU/L   ALT 16 0 - 32 IU/L  Lipid Panel w/o Chol/HDL Ratio   Collection Time: 02/17/23  1:53 PM  Result Value Ref Range   Cholesterol, Total 173 100 - 199 mg/dL   Triglycerides 94 0 - 149 mg/dL   HDL 70 >84 mg/dL   VLDL Cholesterol Cal 17 5 - 40 mg/dL   LDL Chol Calc (NIH) 86 0 - 99 mg/dL      Assessment & Plan:   Problem List Items Addressed This Visit       Cardiovascular and Mediastinum   Benign essential HTN - Primary   Chronic, stable.  BP well below goal today.  Continue current medication regimen and adjust as needed.  Refills sent.  Recommend focus on DASH diet at home and continue to monitor BP regularly.  LABS: CBC, CMP, TSH, urine ALB.  She is very active at baseline.      Relevant Medications   amLODipine  (NORVASC ) 2.5 MG tablet   lisinopril  (ZESTRIL ) 20 MG tablet   rosuvastatin  (CRESTOR ) 20 MG tablet   Other Relevant Orders   Microalbumin, Urine Waived   Comprehensive metabolic panel with GFR   CBC with Differential/Platelet   TSH  Musculoskeletal and Integument   Osteopenia   Chronic.  Noted on DEXA 12/07/19 with T score -2.2.  Continue daily Vitamin D  and check level today. Waiting on DEXA results, performed 08/06/23, to see if cervical spine surgery is option for pain.      Relevant Orders   VITAMIN D  25 Hydroxy (Vit-D Deficiency, Fractures)     Other   Insomnia   Chronic.  Will stop Trazodone  and trial changing Lyrica  to one capsule in the morning and two capsules at night to see if benefit to sleep and pain.  If does offer benefit continue this and discontinue Trazodone .  She is aware to stop Trazodone  and try change in Lyrica .       Hyperlipidemia, mixed   Chronic, ongoing, taking Crestor  daily and tolerating.  Continue this medication regimen and adjust as needed.  Lipid panel and CMP today.      Relevant Medications   amLODipine  (NORVASC ) 2.5 MG tablet   lisinopril  (ZESTRIL ) 20 MG tablet   rosuvastatin  (CRESTOR ) 20 MG tablet   Other Relevant Orders   Comprehensive metabolic panel with GFR   Lipid Panel w/o Chol/HDL Ratio   Chronic pain syndrome   Chronic, ongoing.  Continue to collaborate with Dr. Rhesa Celeste at pain clinic, appreciate their input.  Will increase Lyrica  to 1 capsule in the morning and 2 capsules at night.  She is aware to stop Trazodone  with this change.      Relevant Medications   pregabalin  (LYRICA ) 50 MG capsule   Other Visit Diagnoses       Encounter for annual physical exam       Annual physical today with labs and health maintenance reviewed, discussed with patient.        Follow up plan: Return in about 6 months (around 02/12/2024) for HTN/HLD, OSTEOPENIA.   LABORATORY TESTING:  - Pap smear: not applicable  IMMUNIZATIONS:   - Tdap: Tetanus vaccination status reviewed: last tetanus booster within 10 years. - Influenza: Up to date - Pneumovax: Up to date - Prevnar: Up to date - COVID: Up to date - HPV: Not applicable - Shingrix vaccine: Has had one injection -- she passed out after this, but had two shots at one time -- is due to get second Shingrix and will obtain this.  SCREENING: -Mammogram: Up to date last on 08/06/23 - Colonoscopy: Up to date  - Bone Density: Up to date had on 08/06/23, not resulted yet -Hearing Test: Not applicable  -Spirometry: Not applicable   PATIENT COUNSELING:   Advised to take 1 mg of folate supplement per day if capable of pregnancy.   Sexuality: Discussed sexually transmitted diseases, partner selection, use of condoms, avoidance of unintended pregnancy  and contraceptive alternatives.   Advised to avoid cigarette smoking.  I discussed  with the patient that most people either abstain from alcohol or drink within safe limits (<=14/week and <=4 drinks/occasion for males, <=7/weeks and <= 3 drinks/occasion for females) and that the risk for alcohol disorders and other health effects rises proportionally with the number of drinks per week and how often a drinker exceeds daily limits.  Discussed cessation/primary prevention of drug use and availability of treatment for abuse.   Diet: Encouraged to adjust caloric intake to maintain  or achieve ideal body weight, to reduce intake of dietary saturated fat and total fat, to limit sodium intake by avoiding high sodium foods and not adding table salt, and to maintain adequate dietary potassium and calcium  preferably from fresh  fruits, vegetables, and low-fat dairy products.    Stressed the importance of regular exercise  Injury prevention: Discussed safety belts, safety helmets, smoke detector, smoking near bedding or upholstery.   Dental health: Discussed importance of regular tooth brushing, flossing, and dental visits.    NEXT PREVENTATIVE PHYSICAL DUE IN 1 YEAR. Return in about 6 months (around 02/12/2024) for HTN/HLD, OSTEOPENIA.

## 2023-08-12 NOTE — Progress Notes (Signed)
Contacted via MyChart

## 2023-08-12 NOTE — Assessment & Plan Note (Signed)
 Chronic.  Will stop Trazodone  and trial changing Lyrica  to one capsule in the morning and two capsules at night to see if benefit to sleep and pain.  If does offer benefit continue this and discontinue Trazodone .  She is aware to stop Trazodone  and try change in Lyrica .

## 2023-08-12 NOTE — Assessment & Plan Note (Signed)
 Chronic, stable.  BP well below goal today.  Continue current medication regimen and adjust as needed.  Refills sent.  Recommend focus on DASH diet at home and continue to monitor BP regularly.  LABS: CBC, CMP, TSH, urine ALB.  She is very active at baseline.

## 2023-08-13 ENCOUNTER — Ambulatory Visit: Payer: Self-pay | Admitting: Nurse Practitioner

## 2023-08-13 LAB — LIPID PANEL W/O CHOL/HDL RATIO
Cholesterol, Total: 159 mg/dL (ref 100–199)
HDL: 72 mg/dL (ref >39)
LDL Chol Calc (NIH): 72 mg/dL (ref 0–99)
Triglycerides: 81 mg/dL (ref 0–149)
VLDL Cholesterol Cal: 15 mg/dL (ref 5–40)

## 2023-08-13 LAB — COMPREHENSIVE METABOLIC PANEL WITH GFR
ALT: 16 IU/L (ref 0–32)
AST: 28 IU/L (ref 0–40)
Albumin: 4.5 g/dL (ref 3.9–4.9)
Alkaline Phosphatase: 66 IU/L (ref 44–121)
BUN/Creatinine Ratio: 14 (ref 12–28)
BUN: 13 mg/dL (ref 8–27)
Bilirubin Total: 0.4 mg/dL (ref 0.0–1.2)
CO2: 26 mmol/L (ref 20–29)
Calcium: 9.2 mg/dL (ref 8.7–10.3)
Chloride: 99 mmol/L (ref 96–106)
Creatinine, Ser: 0.92 mg/dL (ref 0.57–1.00)
Globulin, Total: 2.5 g/dL (ref 1.5–4.5)
Glucose: 92 mg/dL (ref 70–99)
Potassium: 4.7 mmol/L (ref 3.5–5.2)
Sodium: 138 mmol/L (ref 134–144)
Total Protein: 7 g/dL (ref 6.0–8.5)
eGFR: 67 mL/min/{1.73_m2} (ref >59)

## 2023-08-13 LAB — CBC WITH DIFFERENTIAL/PLATELET
Basophils Absolute: 0 10*3/uL (ref 0.0–0.2)
Basos: 1 %
EOS (ABSOLUTE): 0.1 10*3/uL (ref 0.0–0.4)
Eos: 3 %
Hematocrit: 39.6 % (ref 34.0–46.6)
Hemoglobin: 12.5 g/dL (ref 11.1–15.9)
Immature Grans (Abs): 0 10*3/uL (ref 0.0–0.1)
Immature Granulocytes: 0 %
Lymphocytes Absolute: 1 10*3/uL (ref 0.7–3.1)
Lymphs: 23 %
MCH: 27.5 pg (ref 26.6–33.0)
MCHC: 31.6 g/dL (ref 31.5–35.7)
MCV: 87 fL (ref 79–97)
Monocytes Absolute: 0.4 10*3/uL (ref 0.1–0.9)
Monocytes: 10 %
Neutrophils Absolute: 2.7 10*3/uL (ref 1.4–7.0)
Neutrophils: 63 %
Platelets: 199 10*3/uL (ref 150–450)
RBC: 4.55 x10E6/uL (ref 3.77–5.28)
RDW: 13 % (ref 11.7–15.4)
WBC: 4.2 10*3/uL (ref 3.4–10.8)

## 2023-08-13 LAB — TSH: TSH: 1.76 u[IU]/mL (ref 0.450–4.500)

## 2023-08-13 LAB — VITAMIN D 25 HYDROXY (VIT D DEFICIENCY, FRACTURES): Vit D, 25-Hydroxy: 43.5 ng/mL (ref 30.0–100.0)

## 2023-08-13 NOTE — Progress Notes (Signed)
 Contacted via MyChart   Good afternoon Danielle Stephens, your labs have returned and overall look fantastic.  No changes needed.  Great news.  Any questions? Keep being amazing!!  Thank you for allowing me to participate in your care.  I appreciate you. Kindest regards, Alexx Giambra

## 2023-08-16 ENCOUNTER — Other Ambulatory Visit: Payer: Self-pay | Admitting: Student in an Organized Health Care Education/Training Program

## 2023-08-16 DIAGNOSIS — Z96611 Presence of right artificial shoulder joint: Secondary | ICD-10-CM

## 2023-08-16 DIAGNOSIS — M12811 Other specific arthropathies, not elsewhere classified, right shoulder: Secondary | ICD-10-CM

## 2023-08-16 DIAGNOSIS — G8929 Other chronic pain: Secondary | ICD-10-CM

## 2023-08-20 ENCOUNTER — Ambulatory Visit: Payer: Self-pay

## 2023-08-20 ENCOUNTER — Ambulatory Visit
Admission: RE | Admit: 2023-08-20 | Discharge: 2023-08-20 | Disposition: A | Discharge status: Home / Self Care | Source: Ambulatory Visit | Attending: Family Medicine | Admitting: Family Medicine

## 2023-08-20 VITALS — BP 144/76 | HR 80 | Temp 98.4°F | Resp 14 | Ht 61.0 in | Wt 111.1 lb

## 2023-08-20 DIAGNOSIS — S60561A Insect bite (nonvenomous) of right hand, initial encounter: Secondary | ICD-10-CM | POA: Diagnosis not present

## 2023-08-20 DIAGNOSIS — L089 Local infection of the skin and subcutaneous tissue, unspecified: Secondary | ICD-10-CM | POA: Diagnosis not present

## 2023-08-20 DIAGNOSIS — S60562A Insect bite (nonvenomous) of left hand, initial encounter: Secondary | ICD-10-CM

## 2023-08-20 DIAGNOSIS — W57XXXA Bitten or stung by nonvenomous insect and other nonvenomous arthropods, initial encounter: Secondary | ICD-10-CM

## 2023-08-20 MED ORDER — CETIRIZINE HCL 10 MG PO TABS: 10.0000 mg | ORAL_TABLET | Freq: Every day | ORAL | 0 refills | Status: AC

## 2023-08-20 MED ORDER — CEPHALEXIN 500 MG PO CAPS
500.0000 mg | ORAL_CAPSULE | Freq: Three times a day (TID) | ORAL | 0 refills | Status: AC
Start: 2023-08-20 — End: 2023-08-27

## 2023-08-20 NOTE — ED Provider Notes (Signed)
 [facility]    CSN: [id number] Arrival date & time: 08/20/23  1115      History   Chief Complaint Chief Complaint  Patient presents with   Insect Bite    Appointment   Rash    HPI Danielle Stephens is a 73 y.o. female presents for insect bite.  Patient reports yesterday she was pulling weeds in the yard without gloves on.  States she was bitten by an unknown insect on her right wrist that is now been oozing a clear liquid.  States she was also bitten on the top of her left hand.  Endorses redness and swelling from the bite areas that extends into her hands and wrist.  No fevers, chills.  No history of MRSA.  She has not taken any OTC medications and symptom onset.  No other concerns at this time.   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   Insomnia 07/23/2021   Raynaud's disease without gangrene 05/06/2021   Type B blood, Rh positive 05/04/2021   PAC (premature atrial contraction) 04/14/2021   Chronic right shoulder pain 12/23/2020   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/2020   Right rotator cuff tear arthropathy 12/23/2020   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   Osteopenia 02/11/2020   Vitamin D  deficiency 08/18/2019   Hyperlipidemia, mixed 08/17/2019   H/O repair of right rotator cuff 04/10/2019   Benign essential HTN 03/19/2019    Past  Surgical History:  Procedure Laterality Date   CLOSED MANIPULATION SHOULDER WITH STERIOD INJECTION Right 08/28/2019   Procedure: MANIPULATION SHOULDER WITH STEROID INJECTION x2;  Surgeon: Lorri Rota, MD;  Location: [facility];  Service: Orthopedics;  Laterality: Right;   COLONOSCOPY     COLONOSCOPY WITH PROPOFOL  N/A 09/23/2021   Procedure: COLONOSCOPY WITH PROPOFOL ;  Surgeon: Selena Daily, MD;  Location: [facility];  Service: Endoscopy;  Laterality: N/A;  ONE ASCENDING COLON POLYP NOT RETRIEVED   DILATION AND CURETTAGE, DIAGNOSTIC / THERAPEUTIC     POLYPECTOMY N/A 09/23/2021   Procedure: POLYPECTOMY;  Surgeon: Selena Daily, MD;  Location: [facility];  Service: Endoscopy;  Laterality: N/A;   REVERSE SHOULDER ARTHROPLASTY Right 05/20/2020   Procedure: Right reverse shoulder arthroplasty, biceps tenodesis;  Surgeon: Lorri Rota, MD;  Location: [facility];  Service: Orthopedics;  Laterality: Right;   rotary cuff Right 03/06/2019   SHOULDER ARTHROSCOPY Right 08/28/2019   Procedure: Right shoulder arthroscopic capsular release, lysis of adhesion,;  Surgeon: Lorri Rota, MD;  Location: [facility];  Service: Orthopedics;  Laterality: Right;    OB History   No obstetric history on file.      Home Medications    Prior to Admission medications   Medication Sig Start Date End Date Taking? Authorizing Provider  cephALEXin  (KEFLEX ) 500  MG capsule Take 1 capsule (500 mg total) by mouth 3 (three) times daily for 7 days. 08/20/23 08/27/23 Yes Emmerson Shuffield, Jodi R, NP  cetirizine (ZYRTEC) 10 MG tablet Take 1 tablet (10 mg total) by mouth daily for 7 days. 08/20/23 08/27/23 Yes Cortney Mckinney, Jodi R, NP  acetaminophen  (TYLENOL ) 500 MG tablet Take 1,000 mg by mouth every 6 (six) hours as needed for mild pain.    [provider]  amLODipine  (NORVASC ) 2.5 MG tablet Take 1 tablet (2.5 mg total) by mouth daily. 08/12/23   Cannady, Jolene T, NP  Biotin 1000 MCG tablet Take 1,000 mcg by mouth daily.     [provider]  Cholecalciferol (VITAMIN D ) 50 MCG (2000 UT) CAPS Take 2,000 Units by mouth daily. gummie    [provider]  cyanocobalamin (VITAMIN B12) 1000 MCG tablet Take 1,000 mcg by mouth daily.    [provider]  lactase (LACTAID) 3000 units tablet Take 1 tablet by mouth 3 (three) times daily with meals.    [provider]  lisinopril  (ZESTRIL ) 20 MG tablet TAKE 1 TABLET BY MOUTH ONCE DAILY 08/12/23   Cannady, Jolene T, NP  methocarbamol  (ROBAXIN ) 500 MG tablet Take 1 tablet (500 mg total) by mouth every 8 (eight) hours as needed for muscle spasms. 08/06/22   Lateef, Bilal, MD  Multiple Vitamin (MULTIVITAMIN) capsule Take 1 capsule by mouth daily.    [provider]  Omega-3 Fatty Acids (OMEGA 3 500 PO) Take 1,000 mg by mouth daily at 2 PM.    [provider]  pregabalin  (LYRICA ) 50 MG capsule Take one capsule (50 MG) by mouth every morning and then take two capsules (100 MG) by mouth before bedtime. 08/12/23   Cannady, Jolene T, NP  rosuvastatin  (CRESTOR ) 20 MG tablet Take 1 tablet (20 mg total) by mouth daily. 08/12/23   Cannady, Jolene T, NP  traZODone  (DESYREL ) 100 MG tablet Take 1 tablet (100 mg total) by mouth at bedtime as needed for sleep. 10/13/22   Lemar Pyles, NP    Family History Family History  Problem Relation Age of Onset   Hypertension Mother    Cancer Mother    Hypertension Father    Heart attack Father    Stroke Sister    Hypertension Sister    Heart attack Brother    Hypertension Sister    Hypertension Sister    Hypertension Sister    Hypertension Sister    Hypertension Sister    Down syndrome Brother    Breast cancer Neg Hx     Social History Social History   Tobacco Use   Smoking status: Never   Smokeless tobacco: Never  Vaping Use   Vaping status: Never Used  Substance Use Topics   Alcohol use: No   Drug use: No     Allergies   Lactose intolerance (gi), Codeine, and  Tramadol    Review of Systems Review of Systems  Skin:        Insect bite     Physical Exam Triage Vital Signs ED Triage Vitals  Encounter Vitals Group     BP 08/20/23 1127 (!) 144/76     Systolic BP Percentile --      Diastolic BP Percentile --      Pulse Rate 08/20/23 1127 80     Resp 08/20/23 1127 14     Temp 08/20/23 1127 98.4 F (36.9 C)     Temp Source 08/20/23 1127 Oral     SpO2  08/20/23 1127 96 %     Weight 08/20/23 1126 111 lb 1.8 oz (50.4 kg)     Height 08/20/23 1126 5\' 1"  (1.549 m)     Head Circumference --      Peak Flow --      Pain Score 08/20/23 1125 4     Pain Loc --      Pain Education --      Exclude from Growth Chart --    No data found.  Updated Vital Signs BP (!) 144/76 (BP Location: Left Arm)   Pulse 80   Temp 98.4 F (36.9 C) (Oral)   Resp 14   Ht 5\' 1"  (1.549 m)   Wt 111 lb 1.8 oz (50.4 kg)   SpO2 96%   BMI 20.99 kg/m   Visual Acuity Right Eye Distance:   Left Eye Distance:   Bilateral Distance:    Right Eye Near:   Left Eye Near:    Bilateral Near:     Physical Exam Vitals and nursing note reviewed.  Constitutional:      General: She is not in acute distress.    Appearance: Normal appearance. She is not ill-appearing.  HENT:     Head: Normocephalic and atraumatic.  Eyes:     Pupils: Pupils are equal, round, and reactive to light.  Cardiovascular:     Rate and Rhythm: Normal rate.  Pulmonary:     Effort: Pulmonary effort is normal.  Musculoskeletal:       Hands:     Comments: There is a single insect bite to the dorsum of the right medial wrist with swelling and erythema that extends from that to the distal hand.  No induration or fluctuance.  Area is warm to touch.  There are couple of insect bites to the dorsum of the left hand with redness and swelling that extends to the wrist.  No induration or fluctuance and area is warm to touch.  Skin:    General: Skin is warm and dry.  Neurological:     General: No focal  deficit present.     Mental Status: She is alert and oriented to person, place, and time.  Psychiatric:        Mood and Affect: Mood normal.        Behavior: Behavior normal.      UC Treatments / Results  Labs (all labs ordered are listed, but only abnormal results are displayed) Labs Reviewed - No data to display  Comprehensive metabolic panel with GFR Order: [id number]  Status: Final result     Next appt: 09/01/2023 at 11:00 AM in Pain Medicine Cephus Collin, MD)     Dx: Benign essential HTN; Hyperlipidemia,...   Test Result Released: Yes (seen)     Messages: Seen   1 Result Note     1 Patient Communication     View Follow-Up Encounter          Component Ref Range & Units (hover) 8 d ago (08/12/23) 6 mo ago (02/17/23) 10 mo ago (10/14/22) 1 yr ago (08/04/22) 1 yr ago (02/03/22) 2 yr ago (07/23/21) 2 yr ago (04/14/21)  Glucose 92 87 94 94 72 75 85  BUN 13 20 15 10 14 13 17   Creatinine, Ser 0.92 0.93 0.94 0.89 0.89 0.85 0.88  eGFR 67 66 66 70 70 75 72  BUN/Creatinine Ratio 14 22 16 11  Low  16 15 19   Sodium 138 138 143 142 144 142 139  Potassium 4.7  4.8 3.9 4.0 4.2 4.4 4.1  Chloride 99 99 104 102 104 101 99  CO2 26 26 25 23 27 29 27   Calcium  9.2 9.4 9.2 9.5 9.4 9.9 9.6  Total Protein 7.0 7.1 6.8 7.1 6.9 7.2 7.4  Albumin 4.5 4.5 4.4 4.6 4.2 4.6 R 4.9 High  R  Globulin, Total 2.5 2.6 2.4 2.5 2.7 2.6 2.5  Bilirubin Total 0.4 0.4 0.4 0.4 0.4 0.3 0.4  Alkaline Phosphatase 66 67 62 66 66 66 83  AST 28 29 27 26 26 28 29   ALT 16 16 16 14 14 16 18   Resulting Agency [facility]         Narrative Performed by: Trenia Fritter Performed at:  [address] [facility] [address], [place], [place]  [id number] Lab Director: Pearlean Botts MD, Phone:  [phone]  Specimen Collected: 08/12/23 10:51 Last Resulted: 08/13/23 05:35    EKG   Radiology No results found.  Procedures Procedures (including critical care  time)  Medications Ordered in UC Medications - No data to display  Initial Impression / Assessment and Plan / UC Course  I have reviewed the triage vital signs and the nursing notes.  Pertinent labs & imaging results that were available during my care of the patient were reviewed by me and considered in my medical decision making (see chart for details).     Reviewed exam and symptoms with patient.  No red flags.  Concern for infection given the amount of redness swelling and warmth.  Will do cetirizine and Keflex .  Discussed cool compresses and OTC hydrocortisone topically if needed.  PCP follow-up if symptoms do not improve.  ER precautions reviewed. Final Clinical Impressions(s) / UC Diagnoses   Final diagnoses:  Insect bite of left hand with infection, initial encounter  Insect bite of right hand with infection, initial encounter   Discharge Instructions      Start cetirizine daily for 7 days.  Start Keflex  3 times a day for 7 days.  You may use over-the-counter hydrocortisone cream to the areas as needed.  Please follow-up with your PCP if your symptoms do not improve.  Please go to the ER for any worsening symptoms.  Hope you feel better soon!  ED Prescriptions     Medication Sig Dispense Auth. Provider   cetirizine (ZYRTEC) 10 MG tablet Take 1 tablet (10 mg total) by mouth daily for 7 days. 7 tablet Elijiah Mickley, Jodi R, NP   cephALEXin  (KEFLEX ) 500 MG capsule Take 1 capsule (500 mg total) by mouth 3 (three) times daily for 7 days. 21 capsule Kiyomi Pallo, Jodi R, NP      PDMP not reviewed this encounter.   Alleen Arbour, NP 08/20/23 1143

## 2023-08-20 NOTE — Telephone Encounter (Signed)
 FYI Only or Action Required?: FYI only for provider  Patient was last seen in primary care on 08/12/2023 by Cannady, Jolene T, NP. Called Nurse Triage reporting Insect Bite. Symptoms began yesterday. Interventions attempted: Rest, hydration, or home remedies. Symptoms are: gradually worsening.  Triage Disposition: See Physician Within 24 Hours (overriding See PCP When Office is Open (Within 3 Days))  Patient/caregiver understands and will follow disposition?: Yes                             Copied from CRM [id number]. Topic: Clinical - Red Word Triage >> Aug 20, 2023  8:58 AM Antwanette L wrote: Red Word that prompted transfer to Nurse Triage: Both of patients hands are swollen. She was bitten by something and it could be an allergic reaction Reason for Disposition  Bite starts to look bad (e.g., blister, purplish skin, ulcer)  (Exception: There is just minor swelling or small red bump.)  Answer Assessment - Initial Assessment Questions 1. TYPE of INSECT: "What type of insect was it?"      Unsure, "really small black bugs" 2. ONSET: "When did you get bitten?"      Yesterday  3. LOCATION: "Where is the insect bite located?"      Bilateral hands 4. REDNESS: "Is the area red or pink?" If Yes, ask: "What size is area of redness?" (inches or cm). "When did the redness start?"     Unsure 5. PAIN: "Is there any pain?" If Yes, ask: "How bad is it?"  (Scale 1-10; or mild, moderate, severe)     Denies, but states she suffers from chronic pain 6. ITCHING: "Does it itch?" If Yes, ask: "How bad is the itch?"    - MILD: doesn't interfere with normal activities   - MODERATE-SEVERE: interferes with work, school, sleep, or other activities      Yes- moderate-severe 7. SWELLING: "How big is the swelling?" (inches, cm, or compare to coins)     Bilateral hand swelling, clear "oozing" from right hand- thinks she scratched this hand overnight  8. OTHER SYMPTOMS: "Do you have any other  symptoms?"  (e.g., difficulty breathing, hives)     Denies difficulty breathing, denies swelling of airway and tongue, denies hives, denies fever, slight headache States she typically reacts this way when bitten by an insect  Protocols used: Insect Bite-A-AH

## 2023-08-20 NOTE — Discharge Instructions (Addendum)
 Start cetirizine daily for 7 days.  Start Keflex  3 times a day for 7 days.  You may use over-the-counter hydrocortisone cream to the areas as needed.  Please follow-up with your PCP if your symptoms do not improve.  Please go to the ER for any worsening symptoms.  Hope you feel better soon!

## 2023-08-20 NOTE — ED Triage Notes (Signed)
 Patient states that she was pulling weeds in her yard yesterday.  Patient reports redness, swelling and itching in both hands.  Patient reports possible insect bite to her right wrist.  Patient reports some drainage from the area.

## 2023-08-30 ENCOUNTER — Ambulatory Visit: Admitting: Student in an Organized Health Care Education/Training Program

## 2023-09-01 ENCOUNTER — Ambulatory Visit
Admission: RE | Admit: 2023-09-01 | Discharge: 2023-09-01 | Disposition: A | Discharge status: Home / Self Care | Source: Ambulatory Visit | Attending: Student in an Organized Health Care Education/Training Program | Admitting: Student in an Organized Health Care Education/Training Program

## 2023-09-01 ENCOUNTER — Other Ambulatory Visit: Payer: Self-pay | Admitting: *Deleted

## 2023-09-01 ENCOUNTER — Encounter: Payer: Self-pay | Admitting: Student in an Organized Health Care Education/Training Program

## 2023-09-01 ENCOUNTER — Ambulatory Visit: Admitting: Student in an Organized Health Care Education/Training Program

## 2023-09-01 VITALS — BP 119/78 | HR 69 | Temp 97.9°F | Resp 18 | Ht 61.0 in | Wt 112.0 lb

## 2023-09-01 DIAGNOSIS — M5412 Radiculopathy, cervical region: Secondary | ICD-10-CM | POA: Diagnosis not present

## 2023-09-01 DIAGNOSIS — M75101 Unspecified rotator cuff tear or rupture of right shoulder, not specified as traumatic: Secondary | ICD-10-CM

## 2023-09-01 DIAGNOSIS — Z96611 Presence of right artificial shoulder joint: Secondary | ICD-10-CM

## 2023-09-01 DIAGNOSIS — M12811 Other specific arthropathies, not elsewhere classified, right shoulder: Secondary | ICD-10-CM | POA: Diagnosis not present

## 2023-09-01 DIAGNOSIS — M25511 Pain in right shoulder: Secondary | ICD-10-CM | POA: Diagnosis not present

## 2023-09-01 DIAGNOSIS — G8929 Other chronic pain: Secondary | ICD-10-CM | POA: Insufficient documentation

## 2023-09-01 DIAGNOSIS — M5010 Cervical disc disorder with radiculopathy, unspecified cervical region: Secondary | ICD-10-CM

## 2023-09-01 MED ORDER — IOHEXOL 180 MG/ML  SOLN
10.0000 mL | Freq: Once | INTRAMUSCULAR | Status: AC
  Administered 2023-09-01: 10 mL via EPIDURAL

## 2023-09-01 MED ORDER — DEXAMETHASONE SODIUM PHOSPHATE 10 MG/ML IJ SOLN
INTRAMUSCULAR | Status: AC
Start: 2023-09-01 — End: 2023-09-01
  Filled 2023-09-01: qty 1

## 2023-09-01 MED ORDER — LIDOCAINE HCL (PF) 2 % IJ SOLN
INTRAMUSCULAR | Status: AC
  Filled 2023-09-01: qty 5

## 2023-09-01 MED ORDER — DEXAMETHASONE SODIUM PHOSPHATE 10 MG/ML IJ SOLN
10.0000 mg | Freq: Once | INTRAMUSCULAR | Status: AC
  Administered 2023-09-01: 10 mg
  Filled 2023-09-01: qty 1

## 2023-09-01 MED ORDER — SODIUM CHLORIDE 0.9% FLUSH
1.0000 mL | Freq: Once | INTRAVENOUS | Status: AC
  Administered 2023-09-01: 1 mL

## 2023-09-01 MED ORDER — LIDOCAINE HCL 2 % IJ SOLN
20.0000 mL | Freq: Once | INTRAMUSCULAR | Status: AC
  Administered 2023-09-01: 100 mg

## 2023-09-01 MED ORDER — SODIUM CHLORIDE (PF) 0.9 % IJ SOLN
INTRAMUSCULAR | Status: AC
Start: 2023-09-01 — End: 2023-09-01
  Filled 2023-09-01: qty 10

## 2023-09-01 MED ORDER — ROPIVACAINE HCL 2 MG/ML IJ SOLN
INTRAMUSCULAR | Status: AC
  Filled 2023-09-01: qty 20

## 2023-09-01 MED ORDER — METHOCARBAMOL 500 MG PO TABS: 500.0000 mg | ORAL_TABLET | Freq: Three times a day (TID) | ORAL | 1 refills | Status: AC | PRN

## 2023-09-01 MED ORDER — DEXAMETHASONE SODIUM PHOSPHATE 10 MG/ML IJ SOLN
10.0000 mg | Freq: Once | INTRAMUSCULAR | Status: AC
  Administered 2023-09-01: 10 mg

## 2023-09-01 MED ORDER — ROPIVACAINE HCL 2 MG/ML IJ SOLN
1.0000 mL | Freq: Once | INTRAMUSCULAR | Status: AC
  Administered 2023-09-01: 1 mL via EPIDURAL

## 2023-09-01 NOTE — Progress Notes (Signed)
 PROVIDER NOTE: Interpretation of information contained herein should be left to medically-trained personnel. Specific patient instructions are provided elsewhere under Patient Instructions section of medical record. This document was created in part using STT-dictation technology, any transcriptional errors that may result from this process are unintentional.  Patient: Danielle Stephens Type: Established DOB: 01/29/1951 MRN: [id number] PCP: Lemar Pyles, NP  Service: Procedure DOS: 09/01/2023 Setting: Ambulatory Location: Ambulatory outpatient facility Delivery: Face-to-face Provider: Cephus Collin, MD Specialty: Interventional Pain Management Specialty designation: 09 Location: Outpatient facility Ref. Prov.: Cannady, Jolene T, NP       Interventional Therapy   Procedure: Cervical Epidural Steroid injection (CESI) (Interlaminar) #2  Laterality: Right  Level: C7-T1 Imaging: Fluoroscopy-assisted DOS: 09/01/2023  Performed by: Cephus Collin, MD Anesthesia: Local anesthesia (1-2% Lidocaine )  Purpose: Diagnostic/Therapeutic Indications: Cervicalgia, cervical radicular pain, degenerative disc disease, severe enough to impact quality of life or function. 1. Cervical radicular pain   2. Herniation of cervical intervertebral disc with radiculopathy   3. Chronic right shoulder pain   4. Right rotator cuff tear arthropathy   5. History of right shoulder replacement    NAS-11 score:   Pre-procedure: 6 /10   Post-procedure: 6 /10      Position  Prep  Materials:  Location setting: Procedure suite Position: Prone, on modified reverse trendelenburg to facilitate breathing, with head in head-cradle. Pillows positioned under chest (below chin-level) with cervical spine flexed. Safety Precautions: Patient was assessed for positional comfort and pressure points before starting the procedure. Prepping solution: DuraPrep (Iodine Povacrylex [0.7% available iodine] and Isopropyl Alcohol, 74%  w/w) Prep Area: Entire  cervicothoracic region Approach: percutaneous, paramedial Intended target: Posterior cervical epidural space Materials Procedure:  Tray: Epidural Needle(s): Epidural (Tuohy) Qty: 1 Length: (90mm) 3.5-inch Gauge: 22G   Pre-op H&P Assessment:  Danielle Stephens is a 73 y.o. (year old), female patient, seen today for interventional treatment. 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 Danielle Stephens has a current medication list which includes the following prescription(s): acetaminophen , amlodipine , biotin, cetirizine , vitamin d , cyanocobalamin, lactase, lisinopril , methocarbamol , multivitamin, omega-3 fatty acids, pregabalin , rosuvastatin , and trazodone . Her primarily concern today is the Neck Pain and Shoulder Pain (right)  Initial Vital Signs:  Pulse/HCG Rate: 69ECG Heart Rate: 65 Temp: 97.9 F (36.6 C) Resp: 16 BP: 121/61 SpO2: 99 %  BMI: Estimated body mass index is 21.16 kg/m as calculated from the following:   Height as of this encounter: 5' 1 (1.549 m).   Weight as of this encounter: 112 lb (50.8 kg).  Risk Assessment: Allergies: Reviewed. She is allergic to lactose intolerance (gi), codeine, and tramadol .  Allergy Precautions: None required Coagulopathies: Reviewed. None identified.  Blood-thinner therapy: None at this time Active Infection(s): Reviewed. None identified. Danielle Stephens is afebrile  Site Confirmation: Danielle Stephens was asked to confirm the procedure and laterality before marking the site Procedure checklist: Completed Consent: Before the procedure and under the influence of no sedative(s), amnesic(s), or anxiolytics, the patient  was informed of the treatment options, risks and possible complications. To fulfill our ethical and legal obligations, as recommended by the American Medical Association's Code of Ethics, I have informed the patient of my clinical impression; the nature and purpose of the treatment or procedure; the risks, benefits, and possible complications of  the intervention; the alternatives, including doing nothing; the risk(s) and benefit(s) of the alternative treatment(s) or procedure(s); and the risk(s) and benefit(s) of doing nothing. The patient was provided information about the general risks and possible complications associated with the procedure. These may include, but are not limited to: failure to achieve desired goals, infection, bleeding, organ or nerve damage, allergic reactions, paralysis, and death. In addition, the patient was informed of those risks and complications associated to Spine-related procedures, such as failure to decrease pain; infection (i.e.: Meningitis, epidural or intraspinal abscess); bleeding (i.e.: epidural hematoma, subarachnoid hemorrhage, or any other type of intraspinal or peri-dural bleeding); organ or nerve damage (i.e.: Any type of peripheral nerve, nerve root, or spinal cord injury) with subsequent damage to sensory, motor, and/or autonomic systems, resulting in permanent pain, numbness, and/or weakness of one or several areas of the body; allergic reactions; (i.e.: anaphylactic reaction); and/or death. Furthermore, the patient was informed of those risks and complications associated with the medications. These include, but are not limited to: allergic reactions (i.e.: anaphylactic or anaphylactoid reaction(s)); adrenal axis suppression; blood sugar elevation that in diabetics may result in ketoacidosis or comma; water  retention that in patients with history of congestive heart failure may result in shortness of breath, pulmonary edema, and decompensation with resultant heart  failure; weight gain; swelling or edema; medication-induced neural toxicity; particulate matter embolism and blood vessel occlusion with resultant organ, and/or nervous system infarction; and/or aseptic necrosis of one or more joints. Finally, the patient was informed that Medicine is not an exact science; therefore, there is also the possibility of unforeseen or unpredictable risks and/or possible complications that may result in a catastrophic outcome. The patient indicated having understood very clearly. We have given the patient no guarantees and we have made no promises. Enough time was given to the patient to ask questions, all of which were answered to the patient's satisfaction. Danielle Stephens has indicated that she wanted to continue with the procedure. Attestation: I, the ordering provider, attest that I have discussed with the patient the benefits, risks, side-effects, alternatives, likelihood of achieving goals, and potential problems during recovery for the procedure that I have provided informed consent. Date  Time: 09/01/2023 10:22 AM   Pre-Procedure Preparation:  Monitoring: As per clinic protocol. Respiration, ETCO2, SpO2, BP, heart rate and rhythm monitor placed and checked for adequate function Safety Precautions: Patient was assessed for positional comfort and pressure points before starting the procedure. Time-out: I initiated and conducted the Time-out before starting the procedure, as per protocol. The patient was asked to participate by confirming the accuracy of the Time Out information. Verification of the correct person, site, and procedure were performed and confirmed by me, the nursing staff, and the patient. Time-out conducted as per Joint Commission's Universal Protocol (UP.01.01.01). Time: 1126  Description  Narrative of Procedure:          Rationale (medical necessity): procedure needed and proper for the diagnosis and/or treatment of the patient's medical symptoms and  needs. Start Time: 1126 hrs. Safety Precautions: Aspiration looking for blood return was conducted prior to all injections. At no point did we inject any substances, as a needle was being advanced. No attempts were made at seeking any paresthesias. Safe injection practices and needle disposal techniques used. Medications properly checked for expiration dates. SDV (single dose vial) medications used. Description of procedure: Protocol guidelines were followed. The patient was assisted into a comfortable position. The target area was identified and the area prepped in the  usual manner. Skin & deeper tissues infiltrated with local anesthetic. Appropriate amount of time allowed to pass for local anesthetics to take effect. Using fluoroscopic guidance, the epidural needle was introduced through the skin, ipsilateral to the reported pain, and advanced to the target area. Posterior laminar os was contacted and the needle walked caudad, until the lamina was cleared. The ligamentum flavum was engaged and the epidural space identified using "loss-of-resistance technique" with 2-3 ml of PF-NaCl (0.9% NSS), in a 5cc dedicated LOR syringe. (See Imaging guidance below for use of contrast details.) Once proper needle placement was secured, and negative aspiration confirmed, the solution was injected in intermittent fashion, asking for systemic symptoms every 0.5cc. The needles were then removed and the area cleansed, making sure to leave some of the prepping solution back to take advantage of its long term bactericidal properties.  3 cc solution made of 1 cc of preservative-free saline, 1 cc of 0.2% ropivacaine , 1 cc of Decadron  10 mg/cc.   Vitals:   09/01/23 1027 09/01/23 1125 09/01/23 1130  BP: 121/61 122/70 119/78  Pulse: 69    Resp: 16 18 18   Temp: 97.9 F (36.6 C)    TempSrc: Temporal    SpO2: 99% 97% 97%  Weight: 112 lb (50.8 kg)    Height: 5' 1 (1.549 m)       End Time: 1132 hrs.  Imaging Guidance  (Spinal):          Type of Imaging Technique: Fluoroscopy Guidance (Spinal) Indication(s): Assistance in needle guidance and placement for procedures requiring needle placement in or near specific anatomical locations not easily accessible without such assistance. Exposure Time: Please see nurses notes. Contrast: Before injecting any contrast, we confirmed that the patient did not have an allergy to iodine, shellfish, or radiological contrast. Once satisfactory needle placement was completed at the desired level, radiological contrast was injected. Contrast injected under live fluoroscopy. No contrast complications. See chart for type and volume of contrast used. Fluoroscopic Guidance: I was personally present during the use of fluoroscopy. Tunnel Vision Technique used to obtain the best possible view of the target area. Parallax error corrected before commencing the procedure. Direction-depth-direction technique used to introduce the needle under continuous pulsed fluoroscopy. Once target was reached, antero-posterior, oblique, and lateral fluoroscopic projection used confirm needle placement in all planes. Images permanently stored in EMR. Interpretation: I personally interpreted the imaging intraoperatively. Adequate needle placement confirmed in multiple planes. Appropriate spread of contrast into desired area was observed. No evidence of afferent or efferent intravascular uptake. No intrathecal or subarachnoid spread observed. Permanent images saved into the patient's record.  Post-operative Assessment:  Post-procedure Vital Signs:  Pulse/HCG Rate: 6974 Temp: 97.9 F (36.6 C) Resp: 18 BP: 119/78 SpO2: 97 %  EBL: None  Complications: No immediate post-treatment complications observed by team, or reported by patient.  Note: The patient tolerated the entire procedure well. A repeat set of vitals were taken after the procedure and the patient was kept under observation following  institutional policy, for this type of procedure. Post-procedural neurological assessment was performed, showing return to baseline, prior to discharge. The patient was provided with post-procedure discharge instructions, including a section on how to identify potential problems. Should any problems arise concerning this procedure, the patient was given instructions to immediately contact us , at any time, without hesitation. In any case, we plan to contact the patient by telephone for a follow-up status report regarding this interventional procedure.  Comments:  No additional relevant information.  Plan of Care (POC)  Orders:  Orders Placed This Encounter  Procedures   DG PAIN CLINIC C-ARM 1-60 MIN NO REPORT    Intraoperative interpretation by procedural physician at [facility].    Standing Status:   Standing    Number of Occurrences:   1    Reason for exam::   Assistance in needle guidance and placement for procedures requiring needle placement in or near specific anatomical locations not easily accessible without such assistance.    Medications ordered for procedure: Meds ordered this encounter  Medications   iohexol  (OMNIPAQUE ) 180 MG/ML injection 10 mL    Must be Myelogram-compatible. If not available, you may substitute with a water -soluble, non-ionic, hypoallergenic, myelogram-compatible radiological contrast medium.   lidocaine  (XYLOCAINE ) 2 % (with pres) injection 400 mg   ropivacaine  (PF) 2 mg/mL (0.2%) (NAROPIN ) injection 1 mL   sodium chloride  flush (NS) 0.9 % injection 1 mL   dexamethasone  (DECADRON ) injection 10 mg   dexamethasone  (DECADRON ) injection 10 mg   Medications administered: We administered iohexol , lidocaine , ropivacaine  (PF) 2 mg/mL (0.2%), sodium chloride  flush, dexamethasone , and dexamethasone .  See the medical record for exact dosing, route, and time of administration.  Follow-up plan:   Return in about 10 weeks (around 11/10/2023) for PPE, F2F.        Right SSNB 01/06/21, 08/06/21 , 10/08/21, 01/21/22, 09/01/23; C-ESI 05/25/22, 09/01/23        Recent Visits Date Type Provider Dept  08/10/23 Office Visit Cephus Collin, MD [facility]  Showing recent visits within past 90 days and meeting all other requirements Today's Visits Date Type Provider Dept  09/01/23 Procedure visit Cephus Collin, MD [facility]  Showing today's visits and meeting all other requirements Future Appointments Date Type Provider Dept  11/11/23 Appointment Cephus Collin, MD [facility]  Showing future appointments within next 90 days and meeting all other requirements  Disposition: Discharge home  Discharge (Date  Time): 09/01/2023; 1140 hrs.   Primary Care Physician: Cannady, Jolene T, NP Location: [facility] Note by: Cephus Collin, MD (TTS technology used. I apologize for any typographical errors that were not detected and corrected.) Date: 09/01/2023; Time: 11:37 AM  Disclaimer:  Medicine is not an Visual merchandiser. The only guarantee in medicine is that nothing is guaranteed. It is important to note that the decision to proceed with this intervention was based on the information collected from the patient. The Data and conclusions were drawn from the patient's questionnaire, the interview, and the physical examination. Because the information was provided in large part by the patient, it cannot be guaranteed that it has not been purposely or unconsciously manipulated. Every effort has been made to obtain as much relevant data as possible for this evaluation. It is important to note that the conclusions that lead to this procedure are derived in large part from the available data. Always take into account that the treatment will also be dependent on availability of resources and existing treatment guidelines, considered by other Pain Management Practitioners as being common knowledge and practice, at the time  of the intervention. For Medico-Legal purposes, it is also important to point out that variation in procedural techniques and pharmacological choices are the acceptable norm. The indications, contraindications, technique, and results of the above procedure should only be interpreted and judged by a Board-Certified Interventional Pain Specialist with extensive familiarity and expertise in the same exact procedure and technique.

## 2023-09-01 NOTE — Progress Notes (Signed)
 PROVIDER NOTE: Interpretation of information contained herein should be left to medically-trained personnel. Specific patient instructions are provided elsewhere under Patient Instructions section of medical record. This document was created in part using STT-dictation technology, any transcriptional errors that may result from this process are unintentional.  Patient: Danielle Stephens Type: Established DOB: Jul 29, 1950 MRN: [id number] PCP: Lemar Pyles, NP  Service: Procedure DOS: 09/01/2023 Setting: Ambulatory Location: Ambulatory outpatient facility Delivery: Face-to-face Provider: Cephus Collin, MD Specialty: Interventional Pain Management Specialty designation: 09 Location: Outpatient facility Ref. Prov.: Cannady, Jolene T, NP    Primary Reason for Visit: Interventional Pain Management Treatment. CC: Neck Pain and Shoulder Pain (right)  Procedure:           Type: Suprascapular nerve block (SSNB) #5  Laterality:  Right Level: Superior to scapular spine, lateral to supraspinatus fossa (Suprascapular notch).  Imaging: Fluoroscopic guidance Anesthesia: Local anesthesia (1-2% Lidocaine ) DOS: 09/01/2023  Performed by: Cephus Collin, MD  Purpose: Diagnostic/Therapeutic Indications: Shoulder pain, severe enough to impact quality of life and/or function. 1. Cervical radicular pain   2. Herniation of cervical intervertebral disc with radiculopathy   3. Chronic right shoulder pain   4. Right rotator cuff tear arthropathy   5. History of right shoulder replacement     NAS-11 score:   Pre-procedure: 6 /10   Post-procedure: 6 /10     Target: Suprascapular nerve Location: midway between the medial border of the scapula and the acromion as it runs through the suprascapular notch. Region: Suprascapular, posterior shoulder  Approach: Percutaneous  Neuroanatomy: The suprascapular nerve is the lateral branch of the superior trunk of the brachial plexus. It receives nerve fibers that  originate in the nerve roots C5 and C6 (and sometimes C4). It is a mixed nerve, meaning that it provides both sensory and motor supply for the suprascapular region. Function: The main function of this nerve is to provide motor innervation for two muscles, the supraspinatus and infraspinatus muscles. They are part of the rotator cuff muscles. In addition, the suprascapular nerve provides a sensory supply to the joints of the scapula (glenohumeral and acromioclavicular joints). Rationale (medical necessity): procedure needed and proper for the diagnosis and/or treatment of the patient's medical symptoms and needs.  Position / Prep / Materials:  Position: Prone Materials:  Tray: Block Needle(s):  Type: Spinal  Gauge (G): 22  Length: 3.5 in.  Qty: 1 Prep solution: DuraPrep (Iodine Povacrylex [0.7% available iodine] and Isopropyl Alcohol, 74% w/w) Prep Area: Entire posterior shoulder area. From upper spine to shoulder proper (upper arm), and from lateral neck to lower tip of shoulder blade.   Pre-op H&P Assessment:  Danielle Stephens is a 73 y.o. (year old), female patient, seen today for interventional treatment. 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 Danielle Stephens has a current medication list which includes the following prescription(s): acetaminophen , amlodipine , biotin, cetirizine , vitamin d , cyanocobalamin, lactase, lisinopril , methocarbamol , multivitamin, omega-3 fatty acids, pregabalin , rosuvastatin , and trazodone . Her primarily concern today is the Neck Pain and Shoulder Pain (right)  Initial Vital Signs:  Pulse/HCG Rate: 69ECG Heart Rate: 65 Temp: 97.9 F (36.6  C) Resp: 16 BP: 121/61 SpO2: 99 %  BMI: Estimated body mass index is 21.16 kg/m as calculated from the following:   Height as of this  encounter: 5' 1 (1.549 m).   Weight as of this encounter: 112 lb (50.8 kg).  Risk Assessment: Allergies: Reviewed. She is allergic to lactose intolerance (gi), codeine, and tramadol .  Allergy Precautions: None required Coagulopathies: Reviewed. None identified.  Blood-thinner therapy: None at this time Active Infection(s): Reviewed. None identified. Danielle Stephens is afebrile  Site Confirmation: Danielle Stephens was asked to confirm the procedure and laterality before marking the site Procedure checklist: Completed Consent: Before the procedure and under the influence of no sedative(s), amnesic(s), or anxiolytics, the patient was informed of the treatment options, risks and possible complications. To fulfill our ethical and legal obligations, as recommended by the American Medical Association's Code of Ethics, I have informed the patient of my clinical impression; the nature and purpose of the treatment or procedure; the risks, benefits, and possible complications of the intervention; the alternatives, including doing nothing; the risk(s) and benefit(s) of the alternative treatment(s) or procedure(s); and the risk(s) and benefit(s) of doing nothing. The patient was provided information about the general risks and possible complications associated with the procedure. These may include, but are not limited to: failure to achieve desired goals, infection, bleeding, organ or nerve damage, allergic reactions, paralysis, and death. In addition, the patient was informed of those risks and complications associated to the procedure, such as failure to decrease pain; infection; bleeding; organ or nerve damage with subsequent damage to sensory, motor, and/or autonomic systems, resulting in permanent pain, numbness, and/or weakness of one or several areas of the body; allergic reactions;  (i.e.: anaphylactic reaction); and/or death. Furthermore, the patient was informed of those risks and complications associated with the medications. These include, but are not limited to: allergic reactions (i.e.: anaphylactic or anaphylactoid reaction(s)); adrenal axis suppression; blood sugar elevation that in diabetics may result in ketoacidosis or comma; water  retention that in patients with history of congestive heart failure may result in shortness of breath, pulmonary edema, and decompensation with resultant heart failure; weight gain; swelling or edema; medication-induced neural toxicity; particulate matter embolism and blood vessel occlusion with resultant organ, and/or nervous system infarction; and/or aseptic necrosis of one or more joints. Finally, the patient was informed that Medicine is not an exact science; therefore, there is also the possibility of unforeseen or unpredictable risks and/or possible complications that may result in a catastrophic outcome. The patient indicated having understood very clearly. We have given the patient no guarantees and we have made no promises. Enough time was given to the patient to ask questions, all of which were answered to the patient's satisfaction. Danielle Stephens has indicated that she wanted to continue with the procedure. Attestation: I, the ordering provider, attest that I have discussed with the patient the benefits, risks, side-effects, alternatives, likelihood of achieving goals, and potential problems during recovery for the procedure that I have provided informed consent. Date  Time: 09/01/2023 10:22 AM  Pre-Procedure Preparation:  Monitoring: As per clinic protocol. Respiration, ETCO2, SpO2, BP, heart rate and rhythm monitor placed and checked for adequate function Safety Precautions: Patient was assessed for positional comfort and pressure points before starting the procedure. Time-out: I initiated and conducted the Time-out before starting the  procedure, as per protocol. The patient was asked to participate by confirming the accuracy of the Time Out information. Verification of the correct person, site, and procedure were performed and confirmed by me, the nursing staff, and the patient. Time-out conducted as per Joint Commission's Universal Protocol (UP.01.01.01). Time: 1126  Description of Procedure:  Procedural Technique Safety Precautions: Aspiration looking for blood return was conducted prior to all injections. At no point did we inject any substances, as a needle was being advanced. No attempts were made at seeking any paresthesias. Safe injection practices and needle disposal techniques used. Medications properly checked for expiration dates. SDV (single dose vial) medications used. Description of the Procedure: Protocol guidelines were followed. The patient was placed in position over the procedure table. The target area was identified and the area prepped in the usual manner. Skin & deeper tissues infiltrated with local anesthetic. Appropriate amount of time allowed to pass for local anesthetics to take effect. The procedure needles were then advanced to the target area. Proper needle placement secured. Negative aspiration confirmed. Solution injected in intermittent fashion, asking for systemic symptoms every 0.5cc of injectate. The needles were then removed and the area cleansed, making sure to leave some of the prepping solution back to take advantage of its long term bactericidal properties.  Vitals:   09/01/23 1027 09/01/23 1125 09/01/23 1130  BP: 121/61 122/70 119/78  Pulse: 69    Resp: 16 18 18   Temp: 97.9 F (36.6 C)    TempSrc: Temporal    SpO2: 99% 97% 97%  Weight: 112 lb (50.8 kg)    Height: 5' 1 (1.549 m)        Start Time: 1126 hrs. End Time: 1132 hrs.  6 cc solution made of 5 cc of 0.2% ropivacaine , 1 cc of Decadron  10 mg/cc.  Injected along the right suprascapular nerve after contrast  confirmation highlighting the suprascapular notch.  Imaging Guidance (Spinal):          Type of Imaging Technique: Fluoroscopy Guidance (Spinal) Indication(s): Assistance in needle guidance and placement for procedures requiring needle placement in or near specific anatomical locations not easily accessible without such assistance. Exposure Time: Please see nurses notes. Contrast: Before injecting any contrast, we confirmed that the patient did not have an allergy to iodine, shellfish, or radiological contrast. Once satisfactory needle placement was completed at the desired level, radiological contrast was injected. Contrast injected under live fluoroscopy. No contrast complications. See chart for type and volume of contrast used. Fluoroscopic Guidance: I was personally present during the use of fluoroscopy. Tunnel Vision Technique used to obtain the best possible view of the target area. Parallax error corrected before commencing the procedure. Direction-depth-direction technique used to introduce the needle under continuous pulsed fluoroscopy. Once target was reached, antero-posterior, oblique, and lateral fluoroscopic projection used confirm needle placement in all planes. Images permanently stored in EMR. Interpretation: I personally interpreted the imaging intraoperatively. Adequate needle placement confirmed in multiple planes. Appropriate spread of contrast into desired area was observed. No evidence of afferent or efferent intravascular uptake. No intrathecal or subarachnoid spread observed. Permanent images saved into the patient's record.  Post-operative Assessment:  Post-procedure Vital Signs:  Pulse/HCG Rate: 6974 Temp: 97.9 F (36.6 C) Resp: 18 BP:  119/78 SpO2: 97 %  EBL: None  Complications: No immediate post-treatment complications observed by team, or reported by patient.  Note: The patient tolerated the entire procedure well. A repeat set of vitals were taken after the  procedure and the patient was kept under observation following institutional policy, for this type of procedure. Post-procedural neurological assessment was performed, showing return to baseline, prior to discharge. The patient was provided with post-procedure discharge instructions, including a section on how to identify potential problems. Should any problems arise concerning this procedure, the patient was given instructions to immediately  contact us , at any time, without hesitation. In any case, we plan to contact the patient by telephone for a follow-up status report regarding this interventional procedure.  Comments:  No additional relevant information.  Plan of Care    Orders:  Orders Placed This Encounter  Procedures   DG PAIN CLIN[facility] C-ARM 1-60 MIN NO REPORT    Intraoperative interpretation by procedural physician at [facility].    Standing Status:   Standing    Number of Occurrences:   1    Reason for exam::   Assistance in needle guidance and placement for procedures requiring needle placement in or near specific anatomical locations not easily accessible without such assistance.    Medications ordered for procedure: Meds ordered this encounter  Medications   iohexol  (OMNIPAQUE ) 180 MG/ML injection 10 mL    Must be Myelogram-compatible. If not available, you may substitute with a water -soluble, non-ionic, hypoallergenic, myelogram-compatible radiological contrast medium.   lidocaine  (XYLOCAINE ) 2 % (with pres) injection 400 mg   ropivacaine  (PF) 2 mg/mL (0.2%) (NAROPIN ) injection 1 mL   sodium chloride  flush (NS) 0.9 % injection 1 mL   dexamethasone  (DECADRON ) injection 10 mg   dexamethasone  (DECADRON ) injection 10 mg   Medications administered: We administered iohexol , lidocaine , ropivacaine  (PF) 2 mg/mL (0.2%), sodium chloride  flush, dexamethasone , and dexamethasone .  See the medical record for exact dosing, route, and time of administration.  Follow-up plan:    Return in about 10 weeks (around 11/10/2023) for PPE, F2F.      Recent Visits Date Type Provider Dept  08/10/23 Office Visit Cephus Collin, MD [facility]  Showing recent visits within past 90 days and meeting all other requirements Today's Visits Date Type Provider Dept  09/01/23 Procedure visit Cephus Collin, MD [facility]  Showing today's visits and meeting all other requirements Future Appointments Date Type Provider Dept  11/11/23 Appointment Cephus Collin, MD [facility]  Showing future appointments within next 90 days and meeting all other requirements  Disposition: Discharge home  Discharge (Date  Time): 09/01/2023; 1140 hrs.   Primary Care Physician: Cannady, Jolene T, NP Location: [facility] Note by: Cephus Collin, MD Date: 09/01/2023; Time: 11:37 AM  Disclaimer:  Medicine is not an exact science. The only guarantee in medicine is that nothing is guaranteed. It is important to note that the decision to proceed with this intervention was based on the information collected from the patient. The Data and conclusions were drawn from the patient's questionnaire, the interview, and the physical examination. Because the information was provided in large part by the patient, it cannot be guaranteed that it has not been purposely or unconsciously manipulated. Every effort has been made to obtain as much relevant data as possible for this evaluation. It is important to note that the conclusions that lead to this procedure are derived in large part from the available data. Always take into account that the treatment will also be dependent on availability of resources and existing treatment guidelines, considered by other Pain Management Practitioners as being common knowledge and practice, at the time of the intervention. For Medico-Legal purposes, it is also important to point out that variation in procedural techniques and  pharmacological choices are the acceptable norm. The indications, contraindications, technique, and results of the above procedure should only be interpreted and judged by a Board-Certified Interventional Pain Specialist with extensive familiarity and expertise in the same exact procedure and technique.

## 2023-09-01 NOTE — Patient Instructions (Signed)

## 2023-09-02 ENCOUNTER — Telehealth: Payer: Self-pay

## 2023-09-02 ENCOUNTER — Telehealth: Payer: Self-pay | Admitting: *Deleted

## 2023-09-02 ENCOUNTER — Telehealth: Payer: Self-pay | Admitting: Student in an Organized Health Care Education/Training Program

## 2023-09-02 NOTE — Telephone Encounter (Signed)
 Taken care of

## 2023-09-02 NOTE — Telephone Encounter (Signed)
 Telephone call from patient stating that she had a procedure yesterday 09/01/23. Patient states after her shower this morning, she noticed that she had a rash on her shoulder and chest.. Wants to speak to nurse.

## 2023-09-02 NOTE — Telephone Encounter (Signed)
 Patient called in re; last phone call about a rash,  she has since gone to [facility] and had pharmacist look at rash and they told her to take benadryl.  She called to see if this is okay and I said  yes, okay to take and that I have sent to Dr Rhesa Celeste and if he says anything different I would let her know.

## 2023-09-02 NOTE — Telephone Encounter (Signed)
 No issues post-procedure.

## 2023-09-02 NOTE — Telephone Encounter (Signed)
 Patient has called in to let us  know that she has a rash across her shoulders down both arms to the hands, red, some bumpy, hives, no itching.  She had procedures on yesterday for right shoulder and lumbar epidural.  She first noticed this at breakfast this morning.

## 2023-10-21 ENCOUNTER — Encounter: Payer: Self-pay | Admitting: Student in an Organized Health Care Education/Training Program

## 2023-10-21 ENCOUNTER — Ambulatory Visit
Attending: Student in an Organized Health Care Education/Training Program | Admitting: Student in an Organized Health Care Education/Training Program

## 2023-10-21 VITALS — BP 155/75 | HR 100 | Temp 97.2°F | Resp 16 | Ht 62.0 in

## 2023-10-21 DIAGNOSIS — M12811 Other specific arthropathies, not elsewhere classified, right shoulder: Secondary | ICD-10-CM | POA: Insufficient documentation

## 2023-10-21 DIAGNOSIS — G8929 Other chronic pain: Secondary | ICD-10-CM | POA: Insufficient documentation

## 2023-10-21 DIAGNOSIS — M25511 Pain in right shoulder: Secondary | ICD-10-CM | POA: Insufficient documentation

## 2023-10-21 DIAGNOSIS — Z96611 Presence of right artificial shoulder joint: Secondary | ICD-10-CM | POA: Insufficient documentation

## 2023-10-21 DIAGNOSIS — M75101 Unspecified rotator cuff tear or rupture of right shoulder, not specified as traumatic: Secondary | ICD-10-CM | POA: Insufficient documentation

## 2023-10-21 MED ORDER — AMITRIPTYLINE HCL 10 MG PO TABS: ORAL_TABLET | ORAL | 0 refills | Status: DC

## 2023-10-21 NOTE — Progress Notes (Signed)
 PROVIDER NOTE: Interpretation of information contained herein should be left to medically-trained personnel. Specific patient instructions are provided elsewhere under Patient Instructions section of medical record. This document was created in part using AI and STT-dictation technology, any transcriptional errors that may result from this process are unintentional.  Patient: Danielle Stephens Apollo  Service: E/M   PCP: Valerio Melanie DASEN, NP  DOB: [birth year]  DOS: 10/21/2023  Provider: Wallie Sherry, MD  MRN: [id number]  Delivery: Face-to-face  Specialty: Interventional Pain Management  Type: Established Patient  Setting: Ambulatory outpatient facility  Specialty designation: 09  Referring Prov.: Valerio Melanie DASEN, NP  Location: Outpatient office facility       History of present illness (HPI) [name] Free, a 73 y.o. year old female, is here today because of her Right rotator cuff tear arthropathy [M75.101, M12.811]. Ms. Belongia's primary complain today is Shoulder Pain (right)  Pertinent problems: Ms. Garmany has H/O repair of right rotator cuff; Chronic right shoulder pain; History of right shoulder replacement; Right rotator cuff tear arthropathy; Chronic pain syndrome; and Suprascapular entrapment neuropathy of right side on their pertinent problem list.  Pain Assessment: Severity of Chronic pain is reported as a 5 /10. Location: Shoulder Right/right arm to the elbow, right thumb. Onset: More than a month ago. Quality: Dull. Timing: Constant. Modifying factor(s): nothing. Vitals:  height is 5' 2 (1.575 m). Her temporal temperature is 97.2 F (36.2 C) (abnormal). Her blood pressure is 155/75 (abnormal) and her pulse is 100. Her respiration is 16 and oxygen saturation is 100%.  BMI: Estimated body mass index is 20.49 kg/m as calculated from the following:   Height as of this encounter: 5' 2 (1.575 m).   Weight as of 09/01/23: 112 lb (50.8 kg).  Last encounter: 08/10/2023. Last procedure: 09/01/2023.  Reason for  encounter: PPE  Post-Procedure Evaluation   Type: Suprascapular nerve block (SSNB) #5  Laterality:  Right Level: Superior to scapular spine, lateral to supraspinatus fossa (Suprascapular notch).  Imaging: Fluoroscopic guidance Anesthesia: Local anesthesia (1-2% Lidocaine ) DOS: 09/01/2023  Performed by: Wallie Sherry, MD  Purpose: Diagnostic/Therapeutic Indications: Shoulder pain, severe enough to impact quality of life and/or function. 1. Cervical radicular pain   2. Herniation of cervical intervertebral disc with radiculopathy   3. Chronic right shoulder pain   4. Right rotator cuff tear arthropathy   5. History of right shoulder replacement     NAS-11 score:   Pre-procedure: 6 /10   Post-procedure: 6 /10     Effectiveness:  Initial hour after procedure: 0 %  Subsequent 4-6 hours post-procedure: 50 %  Analgesia past initial 6 hours: 0 %  Ongoing improvement:  Analgesic:  0%    History of Present Illness   Danielle Stephens is a 73 year old female with rotator cuff arthritis and tears who presents with worsening right shoulder pain.  She experiences worsening right shoulder pain, which has become more severe despite a previous nerve block. The pain originates from a bone in the shoulder area and radiates to her neck and spine.  She has a history of shoulder replacement. It has been a while since her last MRI of the shoulder, and she has not experienced any recent falls or trauma to the shoulder.  Her current medications include Lyrica  (pregabalin ), which she takes at varying doses: 25 mg when cooking or during mild pain, 50 mg during the day when the pain is severe, and two doses at bedtime. She occasionally takes tramadol  for pain  relief. Despite medication, she struggles with sleep, waking up between 3 and 4 AM and unable to return to sleep.      Patient does take tramadol  as needed with her last prescription being in 2024  ROS  Constitutional: Denies any fever or  chills Gastrointestinal: No reported hemesis, hematochezia, vomiting, or acute GI distress Musculoskeletal: Right shoulder pain Neurological: No reported episodes of acute onset apraxia, aphasia, dysarthria, agnosia, amnesia, paralysis, loss of coordination, or loss of consciousness  Medication Review  Biotin, Omega-3 Fatty Acids, Vitamin D , acetaminophen , amLODipine , amitriptyline , cetirizine , cyanocobalamin, lactase, lisinopril , methocarbamol , multivitamin, pregabalin , rosuvastatin , and traZODone   History Review  Allergy: Ms. Capron is allergic to lactose intolerance (gi), codeine, and tramadol . Drug: Ms. Alkins  reports no history of drug use. Alcohol:  reports no history of alcohol use. Tobacco:  reports that she has never smoked. She has never used smokeless tobacco. Social: Ms. Eisenberger  reports that she has never smoked. She has never used smokeless tobacco. She reports that she does not drink alcohol and does not use drugs. Medical:  has a past medical history of Anxiety, Chronic kidney disease, Hypertension, and Shoulder pain. Surgical: Ms. Bronaugh  has a past surgical history that includes Dilation and curettage, diagnostic / therapeutic; rotary cuff (Right, 03/06/2019); Shoulder arthroscopy (Right, 08/28/2019); Closed manipulation shoulder with steroid injection (Right, 08/28/2019); Colonoscopy; Reverse shoulder arthroplasty (Right, 05/20/2020); Colonoscopy with propofol  (N/A, 09/23/2021); and polypectomy (N/A, 09/23/2021). Family: family history includes Cancer in her mother; Down syndrome in her brother; Heart attack in her brother and father; Hypertension in her father, mother, sister, sister, sister, sister, sister, and sister; Stroke in her sister.  Laboratory Chemistry Profile   Renal Lab Results  Component Value Date   BUN 13 08/12/2023   CREATININE 0.92 08/12/2023   BCR 14 08/12/2023   GFRAA 71 02/16/2020   GFRNONAA >60 05/16/2020    Hepatic Lab Results  Component Value Date   AST 28  08/12/2023   ALT 16 08/12/2023   ALBUMIN 4.5 08/12/2023   ALKPHOS 66 08/12/2023    Electrolytes Lab Results  Component Value Date   NA 138 08/12/2023   K 4.7 08/12/2023   CL 99 08/12/2023   CALCIUM  9.2 08/12/2023    Bone Lab Results  Component Value Date   VD25OH 43.5 08/12/2023    Inflammation (CRP: Acute Phase) (ESR: Chronic Phase) Lab Results  Component Value Date   CRP <1 04/14/2021   ESRSEDRATE 8 04/14/2021         Note: Above Lab results reviewed.  Recent Imaging Review  DG Shoulder Right CLINICAL DATA:  Fall last week with bilateral shoulder pain, right lateral chest wall pain and left knee pain.   EXAM: LEFT SHOULDER - 2+ VIEW; RIGHT SHOULDER - 2+ VIEW; LEFT KNEE - COMPLETE 4+ VIEW; RIGHT RIBS - 2 VIEW   COMPARISON:  Shoulder radiographs 12/23/2021. Right shoulder CT 02/13/2022.   FINDINGS: Right shoulder: Status post reverse arthroplasty. The hardware appears intact, without loosening. No evidence of acute fracture or dislocation. Grossly stable postsurgical changes at the acromioclavicular joint. Note: Reviewed        Physical Exam  Vitals: BP (!) 155/75 (Cuff Size: Normal)   Pulse 100   Temp (!) 97.2 F (36.2 C) (Temporal)   Resp 16   Ht 5' 2 (1.575 m)   SpO2 100%   BMI 20.49 kg/m  BMI: Estimated body mass index is 20.49 kg/m as calculated from the following:   Height as of this encounter:  5' 2 (1.575 m).   Weight as of 09/01/23: 112 lb (50.8 kg). Ideal: Patient weight not recorded General appearance: Well nourished, well developed, and well hydrated. In no apparent acute distress Mental status: Alert, oriented x 3 (person, place, & time)       Respiratory: No evidence of acute respiratory distress Eyes: PERLA  Upper Extremity (UE) Exam      Side: Right upper extremity   Side: Left upper extremity  Skin & Extremity Inspection: Skin color, temperature, and hair growth are WNL. No peripheral edema or cyanosis. No masses, redness, swelling,  asymmetry, or associated skin lesions. No contractures.   Skin & Extremity Inspection: Skin color, temperature, and hair growth are WNL. No peripheral edema or cyanosis. No masses, redness, swelling, asymmetry, or associated skin lesions. No contractures.  Functional ROM: Pain restricted ROM for shoulder and elbow   Functional ROM: Unrestricted ROM          Muscle Tone/Strength: Functionally intact. No obvious neuro-muscular anomalies detected.   Muscle Tone/Strength: Functionally intact. No obvious neuro-muscular anomalies detected.  Sensory (Neurological): Neurogenic pain pattern           Sensory (Neurological): Unimpaired          Palpation: No palpable anomalies               Palpation: No palpable anomalies              Provocative Test(s):  Phalen's test: deferred Tinel's test: deferred Apley's scratch test (touch opposite shoulder):  Action 1 (Across chest): Decreased ROM Action 2 (Overhead): Decreased ROM Action 3 (LB reach): Decreased ROM     Provocative Test(s):  Phalen's test: deferred Tinel's test: deferred Apley's scratch test (touch opposite shoulder):  Action 1 (Across chest): deferred Action 2 (Overhead): deferred Action 3 (LB reach): deferred    Assessment   Diagnosis Status  1. Right rotator cuff tear arthropathy   2. History of right shoulder replacement   3. Chronic right shoulder pain    Worsening Stable Deteriorating   Updated Problems: No problems updated.  Plan of Care  Problem-specific:  Assessment and Plan    Right shoulder pain with rotator cuff arthropathy and complete rotator cuff tear   She experiences persistent and worsening right shoulder pain, likely due to progression of rotator cuff arthropathy and a complete rotator cuff tear. A previous nerve block provided minimal relief. There is no history of recent trauma or falls. Order a CT scan of the right shoulder to evaluate changes in the rotator cuff arthropathy and tears.  Neuropathic pain    Her neuropathic pain is likely related to shoulder pathology. She is currently on pregabalin  and tramadol . Consider adding amitriptyline  for its benefits in managing neuropathic pain and aiding sleep. Informed consent was provided regarding potential side effects, including dry mouth, blurry vision, and sedation. Prescribe amitriptyline  10 mg at bedtime for 15 days, then increase to 20 mg. Continue pregabalin  as previously prescribed. Advise to continue tramadol  as needed, but aim to reduce its use if amitriptyline  is effective.  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 Start amitriptyline  as per the neuropathic pain plan to aid with sleep.       Ms. Taysia Rivere Northington has a current medication list which includes the following long-term medication(s): amitriptyline , amlodipine , cetirizine , lisinopril , pregabalin , rosuvastatin , and trazodone .  Pharmacotherapy (Medications Ordered): Meds ordered this encounter  Medications   amitriptyline  (ELAVIL ) 10  MG tablet    Sig: Take 1 tablet (10 mg total) by mouth at bedtime for 15 days, THEN 2 tablets (20 mg total) at bedtime.    Dispense:  105 tablet    Refill:  0   Orders:  Orders Placed This Encounter  Procedures   CT SHOULDER RIGHT WO CONTRAST    Standing Status:   Future    Expiration Date:   01/21/2024    Scheduling Instructions:     Please make sure that the patient understands that this needs to be done as soon as possible. Never have the patient do the imaging just before the next appointment. Inform patient that having the imaging done within the [facility] will expedite the availability of the results and will provide      imaging availability to the requesting physician. In addition inform the patient that the imaging order has an expiration date and will not be renewed if not done within the active period.    Preferred imaging location?:   [facility]     Call Results- Best Contact Number?:   [phone] [facility]    Radiology Contrast Protocol - do NOT remove file path:   [email or web address]     Right SSNB 01/06/21, 08/06/21 , 10/08/21, 01/21/22, 09/01/23; C-ESI 05/25/22, 09/01/23       Return in about 4 weeks (around 11/18/2023) for MM, F2F.    Recent Visits Date Type Provider Dept  09/01/23 Procedure visit Marcelino Nurse, MD [facility]  08/10/23 Office Visit Marcelino Nurse, MD [facility]  Showing recent visits within past 90 days and meeting all other requirements Today's Visits Date Type Provider Dept  10/21/23 Office Visit Marcelino Nurse, MD [facility]  Showing today's visits and meeting all other requirements Future Appointments No visits were found meeting these conditions. Showing future appointments within next 90 days and meeting all other requirements  I discussed the assessment and treatment plan with the patient. The patient was provided an opportunity to ask questions and all were answered. The patient agreed with the plan and demonstrated an understanding of the instructions.  Patient advised to call back or seek an in-person evaluation if the symptoms or condition worsens.  Duration of encounter: 30 minutes.  Total time on encounter, as per AMA guidelines included both the face-to-face and non-face-to-face time personally spent by the physician and/or other qualified health care professional(s) on the day of the encounter (includes time in activities that require the physician or other qualified health care professional and does not include time in activities normally performed by clinical staff). Physician's time may include the following activities when performed: Preparing to see the patient (e.g., pre-charting review of records, searching for previously ordered imaging, lab work, and nerve conduction tests) Review  of prior analgesic pharmacotherapies. Reviewing PMP Interpreting ordered tests (e.g., lab work, imaging, nerve conduction tests) Performing post-procedure evaluations, including interpretation of diagnostic procedures Obtaining and/or reviewing separately obtained history Performing a medically appropriate examination and/or evaluation Counseling and educating the patient/family/caregiver Ordering medications, tests, or procedures Referring and communicating with other health care professionals (when not separately reported) Documenting clinical information in the electronic or other health record Independently interpreting results (not separately reported) and communicating results to the patient/ family/caregiver Care coordination (not separately reported)  Note by: Nurse Marcelino, MD (TTS and AI technology used. I apologize for any typographical errors that were not detected and corrected.) Date: 10/21/2023; Time: 2:24 PM

## 2023-10-28 ENCOUNTER — Ambulatory Visit
Admission: RE | Admit: 2023-10-28 | Discharge: 2023-10-28 | Disposition: A | Discharge status: Home / Self Care | Source: Ambulatory Visit | Attending: Student in an Organized Health Care Education/Training Program | Admitting: Student in an Organized Health Care Education/Training Program

## 2023-10-28 DIAGNOSIS — M25511 Pain in right shoulder: Secondary | ICD-10-CM | POA: Diagnosis not present

## 2023-10-28 DIAGNOSIS — G8929 Other chronic pain: Secondary | ICD-10-CM | POA: Diagnosis not present

## 2023-10-28 DIAGNOSIS — M12811 Other specific arthropathies, not elsewhere classified, right shoulder: Secondary | ICD-10-CM | POA: Diagnosis not present

## 2023-10-28 DIAGNOSIS — Z96611 Presence of right artificial shoulder joint: Secondary | ICD-10-CM | POA: Insufficient documentation

## 2023-10-28 DIAGNOSIS — M75101 Unspecified rotator cuff tear or rupture of right shoulder, not specified as traumatic: Secondary | ICD-10-CM | POA: Diagnosis not present

## 2023-10-28 DIAGNOSIS — I700 Atherosclerosis of aorta: Secondary | ICD-10-CM | POA: Diagnosis not present

## 2023-11-09 ENCOUNTER — Ambulatory Visit: Payer: Self-pay | Admitting: Student in an Organized Health Care Education/Training Program

## 2023-11-11 ENCOUNTER — Ambulatory Visit: Admitting: Student in an Organized Health Care Education/Training Program

## 2023-11-30 ENCOUNTER — Ambulatory Visit: Admitting: Student in an Organized Health Care Education/Training Program

## 2023-12-13 ENCOUNTER — Telehealth: Payer: Self-pay | Admitting: Nurse Practitioner

## 2023-12-13 NOTE — Telephone Encounter (Signed)
 Copied from CRM #[id number]. Topic: Medicare AWV >> Dec 13, 2023  2:41 PM Nathanel DEL wrote: Reason for CRM: LVM 12/13/2023 reminder call for AWV appt on Tuesday 12/14/2023 to confirm awv.   Nathanel Paschal; Care Guide Ambulatory Clinical Support [place] [facility] Direct Dial: [phone]

## 2023-12-14 ENCOUNTER — Ambulatory Visit (INDEPENDENT_AMBULATORY_CARE_PROVIDER_SITE_OTHER): Payer: Self-pay | Admitting: Emergency Medicine

## 2023-12-14 VITALS — Ht 61.0 in | Wt 110.0 lb

## 2023-12-14 DIAGNOSIS — Z0000 Encounter for general adult medical examination without abnormal findings: Secondary | ICD-10-CM | POA: Diagnosis not present

## 2023-12-14 NOTE — Patient Instructions (Signed)
 Danielle Stephens,  Thank you for taking the time for your Medicare Wellness Visit. I appreciate your continued commitment to your health goals. Please review the care plan we discussed, and feel free to reach out if I can assist you further.  Medicare recommends these wellness visits once per year to help you and your care team stay ahead of potential health issues. These visits are designed to focus on prevention, allowing your provider to concentrate on managing your acute and chronic conditions during your regular appointments.  Please note that Annual Wellness Visits do not include a physical exam. Some assessments may be limited, especially if the visit was conducted virtually. If needed, we may recommend a separate in-person follow-up with your provider.  Ongoing Care Seeing your primary care provider every 3 to 6 months helps us  monitor your health and provide consistent, personalized care.   Referrals If a referral was made during today's visit and you haven't received any updates within two weeks, please contact the referred provider directly to check on the status.  Recommended Screenings:  Health Maintenance  Topic Date Due   Zoster (Shingles) Vaccine (2 of 2) 06/06/2020   COVID-19 Vaccine (3 - 2025-26 season) 11/15/2023   Flu Shot  06/13/2024*   Breast Cancer Screening  08/05/2024   Medicare Annual Wellness Visit  12/13/2024   Colon Cancer Screening  09/24/2026   DEXA scan (bone density measurement)  08/05/2028   DTaP/Tdap/Td vaccine (2 - Tdap) 09/02/2031   Pneumococcal Vaccine for age over 38  Completed   Hepatitis C Screening  Completed   HPV Vaccine  Aged Out   Meningitis B Vaccine  Aged Out  *Topic was postponed. The date shown is not the original due date.       12/14/2023    2:38 PM  Advanced Directives  Does Patient Have a Medical Advance Directive? No  Would patient like information on creating a medical advance directive? No - Patient declined   Advance Care  Planning is important because it: Ensures you receive medical care that aligns with your values, goals, and preferences. Provides guidance to your family and loved ones, reducing the emotional burden of decision-making during critical moments.  Vision: Annual vision screenings are recommended for early detection of glaucoma, cataracts, and diabetic retinopathy. These exams can also reveal signs of chronic conditions such as diabetes and high blood pressure.  Dental: Annual dental screenings help detect early signs of oral cancer, gum disease, and other conditions linked to overall health, including heart disease and diabetes.  Please see the attached documents for additional preventive care recommendations.   Fall Prevention in the Home, Adult Falls can cause injuries and affect people of all ages. There are many simple things that you can do to make your home safe and to help prevent falls. If you need it, ask for help making these changes. What actions can I take to prevent falls? General information Use good lighting in all rooms. Make sure to: Replace any light bulbs that burn out. Turn on lights if it is dark and use night-lights. Keep items that you use often in easy-to-reach places. Lower the shelves around your home if needed. Move furniture so that there are clear paths around it. Do not keep throw rugs or other things on the floor that can make you trip. If any of your floors are uneven, fix them. Add color or contrast paint or tape to clearly mark and help you see: Grab bars or handrails. First and  last steps of staircases. Where the edge of each step is. If you use a ladder or stepladder: Make sure that it is fully opened. Do not climb a closed ladder. Make sure the sides of the ladder are locked in place. Have someone hold the ladder while you use it. Know where your pets are as you move through your home. What can I do in the bathroom?     Keep the floor dry. Clean up  any water  that is on the floor right away. Remove soap buildup in the bathtub or shower. Buildup makes bathtubs and showers slippery. Use non-skid mats or decals on the floor of the bathtub or shower. Attach bath mats securely with double-sided, non-slip rug tape. If you need to sit down while you are in the shower, use a non-slip stool. Install grab bars by the toilet and in the bathtub and shower. Do not use towel bars as grab bars. What can I do in the bedroom? Make sure that you have a light by your bed that is easy to reach. Do not use any sheets or blankets on your bed that hang to the floor. Have a firm bench or chair with side arms that you can use for support when you get dressed. What can I do in the kitchen? Clean up any spills right away. If you need to reach something above you, use a sturdy step stool that has a grab bar. Keep electrical cables out of the way. Do not use floor polish or wax that makes floors slippery. What can I do with my stairs? Do not leave anything on the stairs. Make sure that you have a light switch at the top and the bottom of the stairs. Have them installed if you do not have them. Make sure that there are handrails on both sides of the stairs. Fix handrails that are broken or loose. Make sure that handrails are as long as the staircases. Install non-slip stair treads on all stairs in your home if they do not have carpet. Avoid having throw rugs at the top or bottom of stairs, or secure the rugs with carpet tape to prevent them from moving. Choose a carpet design that does not hide the edge of steps on the stairs. Make sure that carpet is firmly attached to the stairs. Fix any carpet that is loose or worn. What can I do on the outside of my home? Use bright outdoor lighting. Repair the edges of walkways and driveways and fix any cracks. Clear paths of anything that can make you trip, such as tools or rocks. Add color or contrast paint or tape to clearly  mark and help you see high doorway thresholds. Trim any bushes or trees on the main path into your home. Check that handrails are securely fastened and in good repair. Both sides of all steps should have handrails. Install guardrails along the edges of any raised decks or porches. Have leaves, snow, and ice cleared regularly. Use sand, salt, or ice melt on walkways during winter months if you live where there is ice and snow. In the garage, clean up any spills right away, including grease or oil spills. What other actions can I take? Review your medicines with your health care provider. Some medicines can make you confused or feel dizzy. This can increase your chance of falling. Wear closed-toe shoes that fit well and support your feet. Wear shoes that have rubber soles and low heels. Use a cane, walker, scooter,  or crutches that help you move around if needed. Talk with your provider about other ways that you can decrease your risk of falls. This may include seeing a physical therapist to learn to do exercises to improve movement and strength. Where to find more information Centers for Disease Control and Prevention, STEAD[employer]: TonerPromos.no [employer] on Aging: BaseRingTones.pl National Institute on Aging: BaseRingTones.pl Contact a health care provider if: You are afraid of falling at home. You feel weak, drowsy, or dizzy at home. You fall at home. Get help right away if you: Lose consciousness or have trouble moving after a fall. Have a fall that causes a head injury. These symptoms may be an emergency. Get help right away. Call 911. Do not wait to see if the symptoms will go away. Do not drive yourself to the hospital. This information is not intended to replace advice given to you by your health care provider. Make sure you discuss any questions you have with your health care provider. Document Revised: 11/03/2021 Document Reviewed: 11/03/2021 Elsevier Patient Education  2024 [employer].

## 2023-12-14 NOTE — Progress Notes (Signed)
 Subjective:   Danielle Stephens is a 73 y.o. who presents for a Medicare Wellness preventive visit.  As a reminder, Annual Wellness Visits don't include a physical exam, and some assessments may be limited, especially if this visit is performed virtually. We may recommend an in-person follow-up visit with your provider if needed.  Visit Complete: Virtual I connected with  Danielle Stephens on 12/14/23 by a video and audio enabled telemedicine application and verified that I am speaking with the correct person using two identifiers.  Patient Location: Home  Provider Location: Office/Clinic  I discussed the limitations of evaluation and management by telemedicine. The patient expressed understanding and agreed to proceed.  Vital Signs: Because this visit was a virtual/telehealth visit, some criteria may be missing or patient reported. Any vitals not documented were not able to be obtained and vitals that have been documented are patient reported.    Persons Participating in Visit: Patient.  AWV Questionnaire: Yes: Patient Medicare AWV questionnaire was completed by the patient on 12/13/23; I have confirmed that all information answered by patient is correct and no changes since this date.  Cardiac Risk Factors include: advanced age (>30men, >7 women);dyslipidemia;hypertension     Objective:    Today's Vitals   12/14/23 1422  Weight: 110 lb (49.9 kg)  Height: 5' 1 (1.549 m)  PainSc: 6    Body mass index is 20.78 kg/m.     12/14/2023    2:38 PM 10/21/2023   11:24 AM 09/01/2023   10:32 AM 08/20/2023   11:27 AM 08/10/2023    9:21 AM 12/08/2022    3:08 PM 05/25/2022    2:03 PM  Advanced Directives  Does Patient Have a Medical Advance Directive? No No No No No Yes No  Does patient want to make changes to medical advance directive?      Yes (MAU/Ambulatory/Procedural Areas - Information given)   Would patient like information on creating a medical advance directive? No - Patient declined           Current Medications (verified) Outpatient Encounter Medications as of 12/14/2023  Medication Sig   acetaminophen  (TYLENOL ) 500 MG tablet Take 1,000 mg by mouth every 6 (six) hours as needed for mild pain.   amLODipine  (NORVASC ) 2.5 MG tablet Take 1 tablet (2.5 mg total) by mouth daily.   Biotin 1000 MCG tablet Take 1,000 mcg by mouth daily. (Patient taking differently: Take 1,000 mcg by mouth daily. Taking 5000mcg daily)   cetirizine  (ZYRTEC ) 10 MG tablet Take 1 tablet (10 mg total) by mouth daily for 7 days. (Patient taking differently: Take 10 mg by mouth daily. Taking PRN)   Cholecalciferol (VITAMIN D ) 50 MCG (2000 UT) CAPS Take 2,000 Units by mouth daily. gummie   cyanocobalamin (VITAMIN B12) 1000 MCG tablet Take 1,000 mcg by mouth daily.   lactase (LACTAID) 3000 units tablet Take 1 tablet by mouth 3 (three) times daily with meals. (Patient taking differently: Take 1 tablet by mouth 3 (three) times daily with meals. Taking PRN)   lisinopril  (ZESTRIL ) 20 MG tablet TAKE 1 TABLET BY MOUTH ONCE DAILY   methocarbamol  (ROBAXIN ) 500 MG tablet Take 1 tablet (500 mg total) by mouth every 8 (eight) hours as needed for muscle spasms.   Multiple Vitamin (MULTIVITAMIN) capsule Take 1 capsule by mouth daily.   Omega-3 Fatty Acids (OMEGA 3 500 PO) Take 1,000 mg by mouth daily at 2 PM.   pregabalin  (LYRICA ) 50 MG capsule Take one capsule (50 MG) by  mouth every morning and then take two capsules (100 MG) by mouth before bedtime. (Patient taking differently: Take one capsule (50 MG) by mouth every morning and then take two capsules (100 MG) by mouth before bedtime. Taking PRN alternating with Trazadone)   Probiotic Product (PROBIOTIC DAILY PO) Take 1 tablet by mouth daily.   rosuvastatin  (CRESTOR ) 20 MG tablet Take 1 tablet (20 mg total) by mouth daily.   traZODone  (DESYREL ) 100 MG tablet Take 1 tablet (100 mg total) by mouth at bedtime as needed for sleep. (Patient taking differently: Take 100 mg by mouth  at bedtime as needed for sleep. Alternating with Pregabalin )   amitriptyline  (ELAVIL ) 10 MG tablet Take 1 tablet (10 mg total) by mouth at bedtime for 15 days, THEN 2 tablets (20 mg total) at bedtime. (Patient not taking: No sig reported)   No facility-administered encounter medications on file as of 12/14/2023.    Allergies (verified) Lactose intolerance (gi), Codeine, and Tramadol    History: Past Medical History:  Diagnosis Date   Anxiety    Chronic kidney disease    STAGE 3   Hypertension    Shoulder pain    Past Surgical History:  Procedure Laterality Date   CLOSED MANIPULATION SHOULDER WITH STERIOD INJECTION Right 08/28/2019   Procedure: MANIPULATION SHOULDER WITH STEROID INJECTION x2;  Surgeon: Tobie Priest, MD;  Location: [facility];  Service: Orthopedics;  Laterality: Right;   COLONOSCOPY     COLONOSCOPY WITH PROPOFOL  N/A 09/23/2021   Procedure: COLONOSCOPY WITH PROPOFOL ;  Surgeon: Unk Corinn Skiff, MD;  Location: [facility];  Service: Endoscopy;  Laterality: N/A;  ONE ASCENDING COLON POLYP NOT RETRIEVED   DILATION AND CURETTAGE, DIAGNOSTIC / THERAPEUTIC     POLYPECTOMY N/A 09/23/2021   Procedure: POLYPECTOMY;  Surgeon: Unk Corinn Skiff, MD;  Location: [facility];  Service: Endoscopy;  Laterality: N/A;   REVERSE SHOULDER ARTHROPLASTY Right 05/20/2020   Procedure: Right reverse shoulder arthroplasty, biceps tenodesis;  Surgeon: Tobie Priest, MD;  Location: [facility];  Service: Orthopedics;  Laterality: Right;   rotary cuff Right 03/06/2019   SHOULDER ARTHROSCOPY Right 08/28/2019   Procedure: Right shoulder arthroscopic capsular release, lysis of adhesion,;  Surgeon: Tobie Priest, MD;  Location: [facility];  Service: Orthopedics;  Laterality: Right;   Family History  Problem Relation Age of Onset   Hypertension Mother    Cancer Mother    Hypertension Father    Heart attack Father    Stroke Sister    Hypertension Sister    Heart attack Brother     Hypertension Sister    Hypertension Sister    Hypertension Sister    Hypertension Sister    Hypertension Sister    Down syndrome Brother    Breast cancer Neg Hx    Social History   Socioeconomic History   Marital status: Married    Spouse name: Tanda   Number of children: 2   Years of education: Not on file   Highest education level: Some college, no degree  Occupational History   Occupation: retired  Tobacco Use   Smoking status: Never   Smokeless tobacco: Never  Vaping Use   Vaping status: Never Used  Substance and Sexual Activity   Alcohol use: No   Drug use: No   Sexual activity: Yes  Other Topics Concern   Not on file  Social History Narrative   Married, still works as a IT sales professional, has 1 biological son and 1 adopted daughter who lives in [place]  Social Drivers of Corporate investment banker Strain: Low Risk  (12/14/2023)   Overall Financial Resource Strain (CARDIA)    Difficulty of Paying Living Expenses: Not hard at all  Food Insecurity: No Food Insecurity (12/14/2023)   Hunger Vital Sign    Worried About Running Out of Food in the Last Year: Never true    Ran Out of Food in the Last Year: Never true  Transportation Needs: No Transportation Needs (12/14/2023)   PRAPARE - Administrator, Civil Service (Medical): No    Lack of Transportation (Non-Medical): No  Physical Activity: Insufficiently Active (12/14/2023)   Exercise Vital Sign    Days of Exercise per Week: 7 days    Minutes of Exercise per Session: 10 min  Stress: No Stress Concern Present (12/14/2023)   [employer] of Occupational Health - Occupational Stress Questionnaire    Feeling of Stress: Not at all  Social Connections: Socially Integrated (12/14/2023)   Social Connection and Isolation Panel    Frequency of Communication with Friends and Family: More than three times a week    Frequency of Social Gatherings with Friends and Family: More than three times a week    Attends  Religious Services: More than 4 times per year    Active Member of [employer] or Organizations: Yes    Attends Engineer, structural: More than 4 times per year    Marital Status: Married    Tobacco Counseling Counseling given: Not Answered    Clinical Intake:  Pre-visit preparation completed: Yes  Pain : 0-10 Pain Score: 6  Pain Type: Chronic pain Pain Location: Shoulder Pain Orientation: Right Pain Descriptors / Indicators: Aching     BMI - recorded: 20.78 Nutritional Status: BMI of 19-24  Normal Nutritional Risks: None Diabetes: No  No results found for: HGBA1C   How often do you need to have someone help you when you read instructions, pamphlets, or other written materials from your doctor or pharmacy?: 1 - Never  Interpreter Needed?: No  Information entered by :: Vina Ned, CMA   Activities of Daily Living     12/14/2023    2:24 PM 12/13/2023    8:55 AM  In your present state of health, do you have any difficulty performing the following activities:  Hearing? 0 0  Vision? 0 0  Difficulty concentrating or making decisions? 0 0  Walking or climbing stairs? 0 0  Dressing or bathing? 0 0  Doing errands, shopping? 0 0  Preparing Food and eating ? N N  Using the Toilet? N N  In the past six months, have you accidently leaked urine? N N  Do you have problems with loss of bowel control? N N  Managing your Medications? N N  Managing your Finances? N N  Housekeeping or managing your Housekeeping? N N    Patient Care Team: Cannady, Jolene T, NP as PCP - General (Nurse Practitioner) Silva Juliene SAUNDERS, DPM as Consulting Physician (Podiatry) Enola Feliciano Hugger, MD as Consulting Physician (Ophthalmology) Marcelino Nurse, MD as Consulting Physician (Pain Medicine)  I have updated your Care Teams any recent Medical Services you may have received from other providers in the past year.     Assessment:   This is a routine wellness examination for  [facility].  Hearing/Vision screen Hearing Screening - Comments:: Denies hearing loss  Vision Screening - Comments:: Gets routine eye exams, Dr. Feliciano [place], [place]   Goals Addressed  This Visit's Progress     COMPLETED: DIET - INCREASE WATER  INTAKE (pt-stated)        Patient Stated        Patient states she would like to take more time for herself       Depression Screen     12/14/2023    2:34 PM 10/21/2023   11:25 AM 09/01/2023   10:32 AM 08/12/2023   10:09 AM 08/10/2023    9:21 AM 06/21/2023    1:41 PM 05/21/2023    9:54 AM  PHQ 2/9 Scores  PHQ - 2 Score 0 0 0 0 0 0 0  PHQ- 9 Score 0   0  0 1    Fall Risk     12/14/2023    2:39 PM 12/13/2023    8:55 AM 10/21/2023   11:25 AM 09/01/2023   10:32 AM 08/12/2023   10:09 AM  Fall Risk   Falls in the past year? 0 0 0 0 0  Number falls in past yr: 0 0   0  Injury with Fall? 0 0   0  Risk for fall due to : No Fall Risks    No Fall Risks  Follow up Falls evaluation completed    Falls evaluation completed    MEDICARE RISK AT HOME:  Medicare Risk at Home Any stairs in or around the home?: Yes If so, are there any without handrails?: No Home free of loose throw rugs in walkways, pet beds, electrical cords, etc?: Yes Adequate lighting in your home to reduce risk of falls?: Yes Life alert?: No Use of a cane, walker or w/c?: No Grab bars in the bathroom?: No Shower chair or bench in shower?: No Elevated toilet seat or a handicapped toilet?: No  TIMED UP AND GO:  Was the test performed?  No  Cognitive Function: 6CIT completed        12/14/2023    2:40 PM 12/08/2022    3:10 PM 12/02/2021   10:01 AM 10/28/2020    2:43 PM 10/27/2019    2:43 PM  6CIT Screen  What Year? 0 points 0 points 0 points 0 points 0 points  What month? 0 points 0 points 0 points 0 points 0 points  What time? 0 points 0 points 0 points 0 points 0 points  Count back from 20 0 points 2 points 2 points 0 points 0 points  Months in reverse 0  points 0 points 0 points 2 points 2 points  Repeat phrase 0 points 0 points 0 points 0 points 0 points  Total Score 0 points 2 points 2 points 2 points 2 points    Immunizations Immunization History  Administered Date(s) Administered   Fluad Quad(high Dose 65+) 04/14/2021, 02/03/2022   Influenza-Unspecified 02/24/2019   PFIZER(Purple Top)SARS-COV-2 Vaccination 05/12/2019, 06/07/2019   Pneumococcal Conjugate-13 07/23/2021   Pneumococcal Polysaccharide-23 04/11/2020   Td 09/01/2021   Zoster Recombinant(Shingrix) 04/11/2020    Screening Tests Health Maintenance  Topic Date Due   Zoster Vaccines- Shingrix (2 of 2) 06/06/2020   COVID-19 Vaccine (3 - 2025-26 season) 11/15/2023   Influenza Vaccine  06/13/2024 (Originally 10/15/2023)   Mammogram  08/05/2024   Medicare Annual Wellness (AWV)  12/13/2024   Colonoscopy  09/24/2026   DEXA SCAN  08/05/2028   DTaP/Tdap/Td (2 - Tdap) 09/02/2031   Pneumococcal Vaccine: 50+ Years  Completed   Hepatitis C Screening  Completed   HPV VACCINES  Aged Out   Meningococcal B Vaccine  Aged Out  Health Maintenance Items Addressed: See Nurse Notes at the end of this note  Additional Screening:  Vision Screening: Recommended annual ophthalmology exams for early detection of glaucoma and other disorders of the eye. Is the patient up to date with their annual eye exam?  Yes  Who is the provider or what is the name of the office in which the patient attends annual eye exams? Dr. Feliciano Ober @ [facility] [place]  Dental Screening: Recommended annual dental exams for proper oral hygiene  Community Resource Referral / Chronic Care Management: CRR required this visit?  No   CCM required this visit?  No   Plan:    I have personally reviewed and noted the following in the patient's chart:   Medical and social history Use of alcohol, tobacco or illicit drugs  Current medications and supplements including opioid prescriptions. Patient is not  currently taking opioid prescriptions. Functional ability and status Nutritional status Physical activity Advanced directives List of other physicians Hospitalizations, surgeries, and ER visits in previous 12 months Vitals Screenings to include cognitive, depression, and falls Referrals and appointments  In addition, I have reviewed and discussed with patient certain preventive protocols, quality metrics, and best practice recommendations. A written personalized care plan for preventive services as well as general preventive health recommendations were provided to patient.   Vina Ned, CMA   12/14/2023   After Visit Summary: (Mail) Due to this being a telephonic visit, the after visit summary with patients personalized plan was offered to patient via mail   Notes:  Declined flu, covid and 2nd shingrix vaccine

## 2023-12-22 ENCOUNTER — Other Ambulatory Visit: Payer: Self-pay | Admitting: Nurse Practitioner

## 2023-12-22 NOTE — Telephone Encounter (Unsigned)
 Copied from CRM [id number]. Topic: Clinical - Medication Refill >> Dec 22, 2023 10:36 AM Sophia H wrote: Medication: pregabalin  (LYRICA ) 25 MG capsule  Has the patient contacted their pharmacy? Yes, told refills on file have expired   This is the patient's preferred pharmacy:  [facility] [address] [place] - [address] [place] [address] Phone: [phone] Fax: [phone]  Is this the correct pharmacy for this prescription? Yes If no, delete pharmacy and type the correct one.   Has the prescription been filled recently? Yes  Is the patient out of the medication? Yes  Has the patient been seen for an appointment in the last year OR does the patient have an upcoming appointment? Yes, seen back in May 2025 / acute visit 10/10.  Can we respond through MyChart? Yes  Agent: Please be advised that Rx refills may take up to 3 business days. We ask that you follow-up with your pharmacy.

## 2023-12-23 ENCOUNTER — Encounter: Payer: Self-pay | Admitting: Student in an Organized Health Care Education/Training Program

## 2023-12-23 ENCOUNTER — Ambulatory Visit
Attending: Student in an Organized Health Care Education/Training Program | Admitting: Student in an Organized Health Care Education/Training Program

## 2023-12-23 VITALS — BP 140/68 | HR 71 | Temp 97.6°F | Resp 16 | Ht 61.0 in | Wt 109.0 lb

## 2023-12-23 DIAGNOSIS — M25511 Pain in right shoulder: Secondary | ICD-10-CM | POA: Diagnosis not present

## 2023-12-23 DIAGNOSIS — M12811 Other specific arthropathies, not elsewhere classified, right shoulder: Secondary | ICD-10-CM | POA: Diagnosis not present

## 2023-12-23 DIAGNOSIS — G8929 Other chronic pain: Secondary | ICD-10-CM | POA: Insufficient documentation

## 2023-12-23 DIAGNOSIS — M5010 Cervical disc disorder with radiculopathy, unspecified cervical region: Secondary | ICD-10-CM | POA: Insufficient documentation

## 2023-12-23 DIAGNOSIS — M75101 Unspecified rotator cuff tear or rupture of right shoulder, not specified as traumatic: Secondary | ICD-10-CM | POA: Diagnosis not present

## 2023-12-23 DIAGNOSIS — Z96611 Presence of right artificial shoulder joint: Secondary | ICD-10-CM | POA: Diagnosis not present

## 2023-12-23 DIAGNOSIS — G894 Chronic pain syndrome: Secondary | ICD-10-CM | POA: Diagnosis not present

## 2023-12-23 DIAGNOSIS — M5412 Radiculopathy, cervical region: Secondary | ICD-10-CM | POA: Diagnosis not present

## 2023-12-23 MED ORDER — HYDROCODONE-ACETAMINOPHEN 5-325 MG PO TABS: 1.0000 | ORAL_TABLET | Freq: Two times a day (BID) | ORAL | 0 refills | Status: DC | PRN

## 2023-12-23 NOTE — Progress Notes (Signed)
 PROVIDER NOTE: Interpretation of information contained herein should be left to medically-trained personnel. Specific patient instructions are provided elsewhere under Patient Instructions section of medical record. This document was created in part using AI and STT-dictation technology, any transcriptional errors that may result from this process are unintentional.  Patient: Danielle Stephens  Service: E/M   PCP: Valerio Melanie DASEN, NP  DOB: [birth year]  DOS: 12/23/2023  Provider: Wallie Sherry, MD  MRN: [id number]  Delivery: Face-to-face  Specialty: Interventional Pain Management  Type: Established Patient  Setting: Ambulatory outpatient facility  Specialty designation: 09  Referring Prov.: Valerio Melanie DASEN, NP  Location: Outpatient office facility       History of present illness (HPI) [name], a 73 y.o. year old female, is here today because of her Right rotator cuff tear arthropathy [M75.101, M12.811]. [name]'s primary complain today is Shoulder Pain (right)  Pertinent problems: [name] has H/O repair of right rotator cuff; Chronic right shoulder pain; History of right shoulder replacement; Right rotator cuff tear arthropathy; Chronic pain syndrome; and Suprascapular entrapment neuropathy of right side on their pertinent problem list.  Pain Assessment: Severity of Chronic pain is reported as a 5 /10. Location: Shoulder  / . Onset: More than a month ago. Quality: Throbbing, Burning, Tightness. Timing: Constant. Modifying factor(s):  SABRA Vitals:  height is 5' 1 (1.549 m) and weight is 109 lb (49.4 kg). Her temporal temperature is 97.6 F (36.4 C). Her blood pressure is 140/68 (abnormal) and her pulse is 71. Her respiration is 16 and oxygen saturation is 99%.  BMI: Estimated body mass index is 20.6 kg/m as calculated from the following:   Height as of this encounter: 5' 1 (1.549 m).   Weight as of this encounter: 109 lb (49.4 kg).  Last encounter: 10/21/2023. Last procedure: 09/01/2023.  Reason  for encounter:   History of Present Illness   Danielle Stephens is a 73 year old female who presents with persistent neck and spine pain.  She experiences persistent pain radiating into her neck and spine, ongoing for several years. Despite being under treatment for three years, she has had limited success in managing the pain.  She has tried various treatments including amitriptyline , which she discontinued after fifteen days due to severe headaches and nightmares. A cervical injection provided relief for only a couple of days, and multiple suprascapular nerve blocks have not provided significant relief.  Currently, she takes meloxicam  during the day, which helps but causes dizziness. She alternates between trazodone  and pregabalin  at bedtime, depending on the severity of her pain, but cannot take them together as it was too much for her when tried previously. She has previously tried hydrocodone , which seemed to help. She is also in contact with a nurse to obtain a 25 mg pregabalin  pill to help manage her pain and maintain functionality around the house.  She continues to exercise to maintain strength and range of motion, although she experiences pain in her spine, neck, and shoulder. Despite this, she continues to exercise to maintain strength and range of motion, although it increases her pain.  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       Pharmacotherapy Assessment    Tramadol  50 mg daily prn severe breakthrough pain--> transition to hydrocodone  as below   Monitoring: [place] PMP: PDMP not reviewed this encounter.  Pharmacotherapy: No side-effects or adverse reactions reported. Compliance: No problems identified. Effectiveness: Clinically acceptable.  Danielle Reda CROME, RN  12/23/2023  8:34 AM  Sign when Signing Visit Safety precautions to be maintained  throughout the outpatient stay will include: orient to surroundings, keep bed in low position, maintain call bell within reach at all times, provide assistance with transfer out of bed and ambulation.   UDS:  Summary  Date Value Ref Range Status  12/23/2020 Note  Final    Comment:    ==================================================================== Compliance Drug Analysis, Ur ==================================================================== Test                             Result       Flag       Units  Drug Present and Declared for Prescription Verification   Tramadol                        290          EXPECTED   ng/mg creat   O-Desmethyltramadol            500          EXPECTED   ng/mg creat    Source of tramadol  is a prescription medication. O-desmethyltramadol    is an expected metabolite of tramadol .    Acetaminophen                   PRESENT      EXPECTED  Drug Present not Declared for Prescription Verification   Diphenhydramine                PRESENT      UNEXPECTED  Drug Absent but Declared for Prescription Verification   Oxycodone                       Not Detected UNEXPECTED ng/mg creat   Tizanidine                      Not Detected UNEXPECTED    Tizanidine , as indicated in the declared medication list, is not    always detected even when used as directed.    Methocarbamol                   Not Detected UNEXPECTED   Doxylamine                     Not Detected UNEXPECTED ==================================================================== Test                      Result    Flag   Units      Ref Range   Creatinine              30               mg/dL      >=79 ==================================================================== Declared Medications:  The flagging and interpretation on this report are based on the  following declared medications.  Unexpected results may arise from  inaccuracies in the declared medications.   **Note: The testing scope of this panel  includes these medications:   Doxylamine  Methocarbamol  (Robaxin )  Oxycodone  (Roxicodone )  Tramadol  (Ultram )   **Note: The testing scope of this panel does not include small to  moderate amounts of these reported medications:   Acetaminophen  (Tylenol )  Tizanidine  (Zanaflex )   **Note: The testing scope of  this panel does not include the  following reported medications:   Biotin  Fexofenadine  (Allegra )  Lisinopril  (Zestril )  Melatonin  Meloxicam  (Mobic )  Multivitamin  Ondansetron  (Zofran )  Vitamin C  Vitamin D  ==================================================================== For clinical consultation, please call [phone]. ====================================================================     No results found for: CBDTHCR No results found for: D8THCCBX No results found for: D9THCCBX  ROS  Constitutional: Denies any fever or chills Gastrointestinal: No reported hemesis, hematochezia, vomiting, or acute GI distress Musculoskeletal: As noted above Neurological: No reported episodes of acute onset apraxia, aphasia, dysarthria, agnosia, amnesia, paralysis, loss of coordination, or loss of consciousness  Medication Review  Biotin, HYDROcodone -acetaminophen , Omega-3 Fatty Acids, Probiotic Product, Vitamin D , acetaminophen , amLODipine , amitriptyline , cetirizine , cyanocobalamin, lactase, lisinopril , methocarbamol , multivitamin, pregabalin , rosuvastatin , and traZODone   History Review  Allergy: Ms. Alvarez is allergic to lactose intolerance (gi), codeine, and tramadol . Drug: Ms. Townsend  reports no history of drug use. Alcohol:  reports no history of alcohol use. Tobacco:  reports that she has never smoked. She has never used smokeless tobacco. Social: Ms. Schweickert  reports that she has never smoked. She has never used smokeless tobacco. She reports that she does not drink alcohol and does not use drugs. Medical:  has a past medical history of Anxiety, Chronic kidney disease,  Hypertension, and Shoulder pain. Surgical: Ms. Codispoti  has a past surgical history that includes Dilation and curettage, diagnostic / therapeutic; rotary cuff (Right, 03/06/2019); Shoulder arthroscopy (Right, 08/28/2019); Closed manipulation shoulder with steroid injection (Right, 08/28/2019); Colonoscopy; Reverse shoulder arthroplasty (Right, 05/20/2020); Colonoscopy with propofol  (N/A, 09/23/2021); and polypectomy (N/A, 09/23/2021). Family: family history includes Cancer in her mother; Down syndrome in her brother; Heart attack in her brother and father; Hypertension in her father, mother, sister, sister, sister, sister, sister, and sister; Stroke in her sister.  Laboratory Chemistry Profile   Renal Lab Results  Component Value Date   BUN 13 08/12/2023   CREATININE 0.92 08/12/2023   BCR 14 08/12/2023   GFRAA 71 02/16/2020   GFRNONAA >60 05/16/2020    Hepatic Lab Results  Component Value Date   AST 28 08/12/2023   ALT 16 08/12/2023   ALBUMIN 4.5 08/12/2023   ALKPHOS 66 08/12/2023    Electrolytes Lab Results  Component Value Date   NA 138 08/12/2023   K 4.7 08/12/2023   CL 99 08/12/2023   CALCIUM  9.2 08/12/2023    Bone Lab Results  Component Value Date   VD25OH 43.5 08/12/2023    Inflammation (CRP: Acute Phase) (ESR: Chronic Phase) Lab Results  Component Value Date   CRP <1 04/14/2021   ESRSEDRATE 8 04/14/2021         Note: Above Lab results reviewed.  Recent Imaging Review  CT SHOULDER RIGHT WO CONTRAST CLINICAL DATA:  Right shoulder pain radiating down the right hand since 09/04/2023  EXAM: CT OF THE UPPER RIGHT EXTREMITY WITHOUT CONTRAST  TECHNIQUE: Multidetector CT imaging of the upper right extremity was performed according to the standard protocol.  RADIATION DOSE REDUCTION: This exam was performed according to the departmental dose-optimization program which includes automated exposure control, adjustment of the mA and/or kV according to patient size and/or  use of iterative reconstruction technique.  COMPARISON:  CT shoulder 02/13/2022  FINDINGS: Total reverse right shoulder arthroplasty with beam hardening artifact partially obscuring the adjacent soft tissue and osseous structures. No periarticular fluid collection or osteolysis. No hardware failure or complication.  No hardware failure or complication. Normal alignment. Prior subacromial decompression and distal clavicular resection.  Bones/Joint/Cartilage  No fracture or dislocation. Normal alignment. No joint effusion.  Ligaments  Ligaments are suboptimally evaluated by CT.  Muscles and Tendons Muscles are normal. No muscle atrophy. No intramuscular fluid collection or hematoma.  Soft tissue No fluid collection or hematoma. No soft tissue mass. Right apical scarring. 3 mm pulmonary nodule in the right lower lobe unchanged compared with 05/07/2020. thoracic aortic atherosclerosis.  IMPRESSION: 1. Total reverse right shoulder arthroplasty with beam hardening artifact partially obscuring the adjacent soft tissue and osseous structures. No periarticular fluid collection or osteolysis. No hardware failure or complication. 2.  No acute osseous injury of the right shoulder. 3.  Aortic Atherosclerosis (ICD10-I70.0).  Electronically Signed   By: Julaine Blanch M.D.   On: 11/08/2023 12:39 Note: Reviewed        Physical Exam  Vitals: BP (!) 140/68 (Cuff Size: Normal)   Pulse 71   Temp 97.6 F (36.4 C) (Temporal)   Resp 16   Ht 5' 1 (1.549 m)   Wt 109 lb (49.4 kg)   SpO2 99%   BMI 20.60 kg/m  BMI: Estimated body mass index is 20.6 kg/m as calculated from the following:   Height as of this encounter: 5' 1 (1.549 m).   Weight as of this encounter: 109 lb (49.4 kg). Ideal: Ideal body weight: 47.8 kg (105 lb 6.1 oz) Adjusted ideal body weight: 48.5 kg (106 lb 13.2 oz) General appearance: Well nourished, well developed, and well hydrated. In no apparent acute  distress Mental status: Alert, oriented x 3 (person, place, & time)       Respiratory: No evidence of acute respiratory distress Eyes: PERLA  Severe right shoulder and right cervical spine pain Assessment   Diagnosis Status  1. Right rotator cuff tear arthropathy   2. History of right shoulder replacement   3. Chronic right shoulder pain   4. Cervical radicular pain   5. Herniation of cervical intervertebral disc with radiculopathy   6. Chronic pain syndrome    Controlled Controlled Controlled   Updated Problems: No problems updated.  Plan of Care  Problem-specific:  Assessment and Plan    Chronic pain involving neck, spine, and right shoulder   Chronic pain persists in the neck, spine, and right shoulder with limited relief from previous interventions like cervical injections and suprascapular nerve blocks. Amitriptyline  was discontinued due to nightmares and headaches. Meloxicam  provides some relief but causes dizziness. Pregabalin  is being considered for chronic nerve pain. Hydrocodone , which previously provided some relief, is being reconsidered. The focus is on optimizing medications to improve quality of life and functionality. Prescribe hydrocodone  twice daily, starting at night, and adjust based on relief and side effects. Recommend a stool softener to minimize constipation risk from hydrocodone . Discontinue meloxicam  if dizziness occurs. Consider pregabalin  for nerve pain management.  Obtain urine toxicology screen at next visit as if patient finds benefit with hydrocodone  for pain management and would like continue.  Adverse effects of pain medications (dizziness, nightmares, headaches)   Amitriptyline  caused nightmares and headaches, leading to its discontinuation. Meloxicam  causes dizziness, posing a fall risk. Discontinue amitriptyline  due to adverse effects. Discontinue meloxicam  if dizziness occurs.          Ms. Apryle Stowell Bittick has a current medication list which  includes the following long-term medication(s): amlodipine , cetirizine , lisinopril , rosuvastatin , trazodone , amitriptyline , and pregabalin .  Pharmacotherapy (Medications Ordered): Meds ordered this encounter  Medications   HYDROcodone -acetaminophen  (NORCO/VICODIN) 5-325 MG tablet    Sig: Take 1-2 tablets by mouth  every 12 (twelve) hours as needed for severe pain (pain score 7-10). Must last 30 days    Dispense:  60 tablet    Refill:  0    Chronic Pain: STOP Act (Not applicable) Fill 1 day early if closed on refill date. Avoid benzodiazepines within 8 hours of opioids   Orders:  No orders of the defined types were placed in this encounter.    Right SSNB 01/06/21, 08/06/21 , 10/08/21, 01/21/22, 09/01/23; C-ESI 05/25/22, 09/01/23       Return in about 1 week (around 12/30/2023) for patient will call to schedule F2F appt prn.    Recent Visits Date Type Provider Dept  10/21/23 Office Visit Marcelino Nurse, MD [facility]  Showing recent visits within past 90 days and meeting all other requirements Today's Visits Date Type Provider Dept  12/23/23 Office Visit Marcelino Nurse, MD [facility]  Showing today's visits and meeting all other requirements Future Appointments No visits were found meeting these conditions. Showing future appointments within next 90 days and meeting all other requirements  I discussed the assessment and treatment plan with the patient. The patient was provided an opportunity to ask questions and all were answered. The patient agreed with the plan and demonstrated an understanding of the instructions.  Patient advised to call back or seek an in-person evaluation if the symptoms or condition worsens.  I personally spent a total of 30 minutes in the care of the patient today including preparing to see the patient, getting/reviewing separately obtained history, performing a medically appropriate exam/evaluation, counseling and educating, placing orders,  and documenting clinical information in the EHR.   Note by: Nurse Marcelino, MD (TTS and AI technology used. I apologize for any typographical errors that were not detected and corrected.) Date: 12/23/2023; Time: 9:47 AM

## 2023-12-23 NOTE — Progress Notes (Signed)
 Safety precautions to be maintained throughout the outpatient stay will include: orient to surroundings, keep bed in low position, maintain call bell within reach at all times, provide assistance with transfer out of bed and ambulation.

## 2023-12-24 ENCOUNTER — Ambulatory Visit (INDEPENDENT_AMBULATORY_CARE_PROVIDER_SITE_OTHER): Admitting: Nurse Practitioner

## 2023-12-24 ENCOUNTER — Encounter: Payer: Self-pay | Admitting: Nurse Practitioner

## 2023-12-24 VITALS — BP 131/70 | HR 71 | Temp 98.4°F | Ht 61.0 in | Wt 111.0 lb

## 2023-12-24 DIAGNOSIS — L03011 Cellulitis of right finger: Secondary | ICD-10-CM | POA: Insufficient documentation

## 2023-12-24 MED ORDER — PREGABALIN 50 MG PO CAPS: 100.0000 mg | ORAL_CAPSULE | Freq: Every day | ORAL | 2 refills | Status: AC

## 2023-12-24 MED ORDER — PREGABALIN 25 MG PO CAPS: 25.0000 mg | ORAL_CAPSULE | Freq: Two times a day (BID) | ORAL | 2 refills | Status: AC

## 2023-12-24 MED ORDER — MUPIROCIN 2 % EX OINT: 1.0000 | TOPICAL_OINTMENT | Freq: Two times a day (BID) | CUTANEOUS | 0 refills | Status: AC

## 2023-12-24 MED ORDER — AMOXICILLIN-POT CLAVULANATE 875-125 MG PO TABS: 1.0000 | ORAL_TABLET | Freq: Two times a day (BID) | ORAL | 0 refills | Status: AC

## 2023-12-24 NOTE — Patient Instructions (Signed)
 Cellulitis, Adult    Cellulitis is a skin infection. The infected area is often warm, red, swollen, and sore. It occurs most often on the legs, feet, and toes, but can happen on any part of the body.  This condition can be life-threatening without treatment. It is very important to get treated right away.  What are the causes?  This condition is caused by bacteria. The bacteria enter through a break in the skin, such as:  A cut.  A burn.  A bug bite.  An animal bite.  An open sore.  A crack.  What increases the risk?  Having a weak body's defense system (immune system).  Being older than 71 years old.  Having a blood sugar problem (diabetes).  Having a long-term liver disease (cirrhosis) or kidney disease.  Being very overweight (obese).  Having a skin problem, such as:  An itchy rash.  A rash caused by a fungus.  A rash with blisters.  Slow movement of blood in the veins (venous stasis).  Fluid buildup below the skin (edema).  This condition is more likely to occur in people who:  Have open cuts, burns, bites, or scrapes on the skin.  Have been treated with high-energy rays (radiation).  Use IV drugs.  What are the signs or symptoms?  Skin that:  Looks red or purple, or slightly darker than your usual skin color.  Has streaks.  Has spots.  Is swollen.  Is sore or painful when you touch it.  Is warm.  A fever.  Chills.  Blisters.  Tiredness (fatigue).  How is this treated?  Medicines to treat infections or allergies.  Rest.  Placing cold or warm cloths on the skin.  Staying in the hospital, if the condition is very bad. You may need medicines through an IV.  Follow these instructions at home:  Medicines  Take over-the-counter and prescription medicines only as told by your doctor.  If you were prescribed antibiotics, take them as told by your doctor. Do not stop using them even if you start to feel better.  General instructions  Drink enough fluid to keep your pee (urine) pale yellow.  Do not touch or rub the  infected area.  Raise (elevate) the infected area above the level of your heart while you are sitting or lying down.  Return to your normal activities when your doctor says that it is safe.  Place cold or warm cloths on the area as told by your doctor.  Keep all follow-up visits. Your doctor will need to make sure that a more serious infection is not developing.  Contact a doctor if:  You have a fever.  You do not start to get better after 1-2 days of treatment.  Your bone or joint under the infected area starts to hurt after the skin has healed.  Your infection comes back in the same area or another area. Signs of this may include:  You have a swollen bump in the area.  Your red area gets larger, turns dark in color, or hurts more.  You have more fluid coming from the wound.  Pus or a bad smell develops in your infected area.  You have more pain.  You feel sick and have muscle aches and weakness.  You develop vomiting or watery poop that will not go away.  Get help right away if:  You see red streaks coming from the area.  You notice the skin turns purple or black and falls  off.  These symptoms may be an emergency. Get help right away. Call 911.  Do not wait to see if the symptoms will go away.  Do not drive yourself to the hospital.  This information is not intended to replace advice given to you by your health care provider. Make sure you discuss any questions you have with your health care provider.  Document Revised: 10/28/2021 Document Reviewed: 10/28/2021  Elsevier Patient Education  2024 [employer].

## 2023-12-24 NOTE — Progress Notes (Signed)
 BP 131/70   Pulse 71   Temp 98.4 F (36.9 C) (Oral)   Ht 5' 1 (1.549 m)   Wt 111 lb (50.3 kg)   SpO2 97%   BMI 20.97 kg/m    Subjective:    Patient ID: Danielle Stephens, female    DOB: June 13, 1950, 73 y.o.   MRN: [id number]  HPI: Danielle Stephens is a 73 y.o. female  Chief Complaint  Patient presents with   Thumb Pain    Patient states she cut her thumb on her R hand a few months ago and she continues to have occasional redness and swelling. Wants to check on it before she travels out of the country.    THUMB PAIN A couple months ago cut her right thumb on a screen from her pan, she is concerned that she may have got screen stuck in skin.  Area has healed but recently became red and swollen to area. Tetanus is up to date, last 09/01/21. Needs Lyrica  refills. Duration: weeks Involved hand: right thumb Mechanism of injury: trauma Location: diffuse Onset: sudden Severity: 6/10  Quality: sharp, aching, and throbbing Frequency: constant Radiation: no Aggravating factors: nothing Alleviating factors: nothing Treatments attempted: soaking it, takes pain medications at baseline Relief with NSAIDs?: No NSAIDs Taken Weakness: no Numbness: no Redness: yes Swelling:yes Bruising: no Fevers: no   Relevant past medical, surgical, family and social history reviewed and updated as indicated. Interim medical history since our last visit reviewed. Allergies and medications reviewed and updated.  Review of Systems  Constitutional:  Negative for activity change, appetite change, diaphoresis, fatigue and fever.  Respiratory:  Negative for cough, chest tightness, shortness of breath and wheezing.   Cardiovascular:  Negative for chest pain, palpitations and leg swelling.  Gastrointestinal: Negative.   Musculoskeletal:  Positive for arthralgias.  Neurological:  Negative for dizziness, syncope, weakness, light-headedness, numbness and headaches.  Psychiatric/Behavioral: Negative.     Per HPI  unless specifically indicated above     Objective:    BP 131/70   Pulse 71   Temp 98.4 F (36.9 C) (Oral)   Ht 5' 1 (1.549 m)   Wt 111 lb (50.3 kg)   SpO2 97%   BMI 20.97 kg/m   Wt Readings from Last 3 Encounters:  12/24/23 111 lb (50.3 kg)  12/23/23 109 lb (49.4 kg)  12/14/23 110 lb (49.9 kg)    Physical Exam Vitals and nursing note reviewed.  Constitutional:      General: She is awake. She is not in acute distress.    Appearance: She is well-developed and well-groomed. She is not ill-appearing or toxic-appearing.  HENT:     Head: Normocephalic.     Right Ear: Hearing and external ear normal.     Left Ear: Hearing and external ear normal.  Eyes:     General: Lids are normal.        Right eye: No discharge.        Left eye: No discharge.     Conjunctiva/sclera: Conjunctivae normal.     Pupils: Pupils are equal, round, and reactive to light.  Neck:     Thyroid : No thyromegaly.     Vascular: No carotid bruit.  Cardiovascular:     Rate and Rhythm: Normal rate and regular rhythm.     Heart sounds: Normal heart sounds. No murmur heard.    No gallop.  Pulmonary:     Effort: Pulmonary effort is normal. No accessory muscle usage or respiratory distress.  Breath sounds: Normal breath sounds.  Abdominal:     General: Bowel sounds are normal. There is no distension.     Palpations: Abdomen is soft.     Tenderness: There is no abdominal tenderness.  Musculoskeletal:     Cervical back: Normal range of motion and neck supple.     Right lower leg: No edema.     Left lower leg: No edema.  Lymphadenopathy:     Cervical: No cervical adenopathy.  Skin:    General: Skin is warm and dry.     Findings: No rash.      Neurological:     Mental Status: She is alert and oriented to person, place, and time.     Deep Tendon Reflexes: Reflexes are normal and symmetric.     Reflex Scores:      Brachioradialis reflexes are 2+ on the right side and 2+ on the left side.      Patellar  reflexes are 2+ on the right side and 2+ on the left side. Psychiatric:        Attention and Perception: Attention normal.        Mood and Affect: Mood normal.        Speech: Speech normal.        Behavior: Behavior normal. Behavior is cooperative.        Thought Content: Thought content normal.    Results for orders placed or performed in visit on 08/12/23  Microalbumin, Urine Waived   Collection Time: 08/12/23 10:50 AM  Result Value Ref Range   Microalb, Ur Waived 30 (H) 0 - 19 mg/L   Creatinine, Urine Waived 50 10 - 300 mg/dL   Microalb/Creat Ratio 30-300 (H) <30 mg/g  Comprehensive metabolic panel with GFR   Collection Time: 08/12/23 10:51 AM  Result Value Ref Range   Glucose 92 70 - 99 mg/dL   BUN 13 8 - 27 mg/dL   Creatinine, Ser 9.07 0.57 - 1.00 mg/dL   eGFR 67 >40 fO/fpw/8.26   BUN/Creatinine Ratio 14 12 - 28   Sodium 138 134 - 144 mmol/L   Potassium 4.7 3.5 - 5.2 mmol/L   Chloride 99 96 - 106 mmol/L   CO2 26 20 - 29 mmol/L   Calcium  9.2 8.7 - 10.3 mg/dL   Total Protein 7.0 6.0 - 8.5 g/dL   Albumin 4.5 3.9 - 4.9 g/dL   Globulin, Total 2.5 1.5 - 4.5 g/dL   Bilirubin Total 0.4 0.0 - 1.2 mg/dL   Alkaline Phosphatase 66 44 - 121 IU/L   AST 28 0 - 40 IU/L   ALT 16 0 - 32 IU/L  CBC with Differential/Platelet   Collection Time: 08/12/23 10:51 AM  Result Value Ref Range   WBC 4.2 3.4 - 10.8 x10E3/uL   RBC 4.55 3.77 - 5.28 x10E6/uL   Hemoglobin 12.5 11.1 - 15.9 g/dL   Hematocrit 60.3 65.9 - 46.6 %   MCV 87 79 - 97 fL   MCH 27.5 26.6 - 33.0 pg   MCHC 31.6 31.5 - 35.7 g/dL   RDW 86.9 88.2 - 84.5 %   Platelets 199 150 - 450 x10E3/uL   Neutrophils 63 Not Estab. %   Lymphs 23 Not Estab. %   Monocytes 10 Not Estab. %   Eos 3 Not Estab. %   Basos 1 Not Estab. %   Neutrophils Absolute 2.7 1.4 - 7.0 x10E3/uL   Lymphocytes Absolute 1.0 0.7 - 3.1 x10E3/uL   Monocytes Absolute 0.4 0.1 -  0.9 x10E3/uL   EOS (ABSOLUTE) 0.1 0.0 - 0.4 x10E3/uL   Basophils Absolute 0.0 0.0 - 0.2  x10E3/uL   Immature Granulocytes 0 Not Estab. %   Immature Grans (Abs) 0.0 0.0 - 0.1 x10E3/uL  Lipid Panel w/o Chol/HDL Ratio   Collection Time: 08/12/23 10:51 AM  Result Value Ref Range   Cholesterol, Total 159 100 - 199 mg/dL   Triglycerides 81 0 - 149 mg/dL   HDL 72 >60 mg/dL   VLDL Cholesterol Cal 15 5 - 40 mg/dL   LDL Chol Calc (NIH) 72 0 - 99 mg/dL  TSH   Collection Time: 08/12/23 10:51 AM  Result Value Ref Range   TSH 1.760 0.450 - 4.500 uIU/mL  VITAMIN D  25 Hydroxy (Vit-D Deficiency, Fractures)   Collection Time: 08/12/23 10:51 AM  Result Value Ref Range   Vit D, 25-Hydroxy 43.5 30.0 - 100.0 ng/mL      Assessment & Plan:   Problem List Items Addressed This Visit       Other   Cellulitis of right thumb - Primary   Acute, no foreign body noted. Will start Augmentin BID for 7 days and Mupirocin  to apply around nail bed.  To monitor area closely and if ongoing or worsening then return to office.  If fever presents alert provider immediately.        Follow up plan: Return for as scheduled in December.

## 2023-12-24 NOTE — Assessment & Plan Note (Signed)
 Acute, no foreign body noted. Will start Augmentin BID for 7 days and Mupirocin  to apply around nail bed.  To monitor area closely and if ongoing or worsening then return to office.  If fever presents alert provider immediately.

## 2023-12-24 NOTE — Telephone Encounter (Signed)
 Requested medication (s) are due for refill today: yes  Requested medication (s) are on the active medication list: yes  Last refill:  08/12/23  Future visit scheduled: yes  Notes to clinic:  Unable to refill per protocol, cannot delegate.      Requested Prescriptions  Pending Prescriptions Disp Refills   pregabalin  (LYRICA ) 50 MG capsule 90 capsule 4    Sig: Take one capsule (50 MG) by mouth every morning and then take two capsules (100 MG) by mouth before bedtime.     Not Delegated - Neurology:  Anticonvulsants - Controlled - pregabalin  Failed - 12/24/2023  8:49 AM      Failed - This refill cannot be delegated      Passed - Cr in normal range and within 360 days    Creatinine, Ser  Date Value Ref Range Status  08/12/2023 0.92 0.57 - 1.00 mg/dL Final         Passed - Completed PHQ-2 or PHQ-9 in the last 360 days      Passed - Valid encounter within last 12 months    Recent Outpatient Visits           4 months ago Benign essential HTN   [place] [facility] [place], Melanie T, NP   6 months ago Post herpetic neuralgia   [place] [facility] [place], [place] T, NP   7 months ago Post herpetic neuralgia   [facility] [place], [place] T, NP   7 months ago Herpes zoster without complication   [place] [facility] [place], Angeline ORN, [place]

## 2023-12-27 ENCOUNTER — Ambulatory Visit

## 2023-12-27 ENCOUNTER — Other Ambulatory Visit: Payer: Self-pay | Admitting: *Deleted

## 2023-12-27 ENCOUNTER — Telehealth: Payer: Self-pay | Admitting: Student in an Organized Health Care Education/Training Program

## 2023-12-27 DIAGNOSIS — Z96611 Presence of right artificial shoulder joint: Secondary | ICD-10-CM

## 2023-12-27 DIAGNOSIS — M12811 Other specific arthropathies, not elsewhere classified, right shoulder: Secondary | ICD-10-CM

## 2023-12-27 DIAGNOSIS — G8929 Other chronic pain: Secondary | ICD-10-CM

## 2023-12-27 NOTE — Telephone Encounter (Signed)
 I called [facility] and verified this.  Rx request sent to [facility] to write for remaining 25 days.

## 2023-12-27 NOTE — Telephone Encounter (Signed)
 Pharmacy lvmail stating they could only give patient 5 day supply of Norco. Then will need new scripts. Please call pharmacy

## 2023-12-28 ENCOUNTER — Other Ambulatory Visit: Payer: Self-pay | Admitting: Nurse Practitioner

## 2023-12-28 DIAGNOSIS — Z96611 Presence of right artificial shoulder joint: Secondary | ICD-10-CM

## 2023-12-28 DIAGNOSIS — M12811 Other specific arthropathies, not elsewhere classified, right shoulder: Secondary | ICD-10-CM

## 2023-12-28 DIAGNOSIS — G8929 Other chronic pain: Secondary | ICD-10-CM

## 2023-12-28 MED ORDER — HYDROCODONE-ACETAMINOPHEN 5-325 MG PO TABS: 1.0000 | ORAL_TABLET | Freq: Two times a day (BID) | ORAL | 0 refills | Status: AC | PRN

## 2024-01-13 NOTE — Progress Notes (Unsigned)
   01/13/2024  Patient ID: Danielle Stephens, female   DOB: Feb 18, 1951, 73 y.o.   MRN: [id number]  This patient is appearing on a report for being at risk of failing the adherence measure for hypertension (ACEi/ARB) medications this calendar year.   Medication: lisinopril  20 mg tablets Last fill date: 12/23/23 for 90 day supply  Insurance report was not up to date. No action needed at this time.   Danielle Stephens Danielle Stephens [facility] PharmD Candidate Class of [phone]

## 2024-02-11 NOTE — Patient Instructions (Signed)
 Be Involved in Caring For Your Health:  Taking Medications When medications are taken as directed, they can greatly improve your health. But if they are not taken as prescribed, they may not work. In some cases, not taking them correctly can be harmful. To help ensure your treatment remains effective and safe, understand your medications and how to take them. Bring your medications to each visit for review by your provider.  Your lab results, notes, and after visit summary will be available on My Chart. We strongly encourage you to use this feature. If lab results are abnormal the clinic will contact you with the appropriate steps. If the clinic does not contact you assume the results are satisfactory. You can always view your results on My Chart. If you have questions regarding your health or results, please contact the clinic during office hours. You can also ask questions on My Chart.  We at [facility] are grateful that you chose us  to provide your care. We strive to provide evidence-based and compassionate care and are always looking for feedback. If you get a survey from the clinic please complete this so we can hear your opinions.  DASH Eating Plan DASH stands for Dietary Approaches to Stop Hypertension. The DASH eating plan is a healthy eating plan that has been shown to: Lower high blood pressure (hypertension). Reduce your risk for type 2 diabetes, heart disease, and stroke. Help with weight loss. What are tips for following this plan? Reading food labels Check food labels for the amount of salt (sodium) per serving. Choose foods with less than 5 percent of the Daily Value (DV) of sodium. In general, foods with less than 300 milligrams (mg) of sodium per serving fit into this eating plan. To find whole grains, look for the word whole as the first word in the ingredient list. Shopping Buy products labeled as low-sodium or no salt added. Buy fresh foods. Avoid canned  foods and pre-made or frozen meals. Cooking Try not to add salt when you cook. Use salt-free seasonings or herbs instead of table salt or sea salt. Check with your health care provider or pharmacist before using salt substitutes. Do not fry foods. Cook foods in healthy ways, such as baking, boiling, grilling, roasting, or broiling. Cook using oils that are good for your heart. These include olive, canola, avocado, soybean, and sunflower oil. Meal planning  Eat a balanced diet. This should include: 4 or more servings of fruits and 4 or more servings of vegetables each day. Try to fill half of your plate with fruits and vegetables. 6-8 servings of whole grains each day. 6 or less servings of lean meat, poultry, or fish each day. 1 oz is 1 serving. A 3 oz (85 g) serving of meat is about the same size as the palm of your hand. One egg is 1 oz (28 g). 2-3 servings of low-fat dairy each day. One serving is 1 cup (237 mL). 1 serving of nuts, seeds, or beans 5 times each week. 2-3 servings of heart-healthy fats. Healthy fats called omega-3 fatty acids are found in foods such as walnuts, flaxseeds, fortified milks, and eggs. These fats are also found in cold-water fish, such as sardines, salmon, and mackerel. Limit how much you eat of: Canned or prepackaged foods. Food that is high in trans fat, such as fried foods. Food that is high in saturated fat, such as fatty meat. Desserts and other sweets, sugary drinks, and other foods with added sugar. Full-fat  dairy products. Do not salt foods before eating. Do not eat more than 4 egg yolks a week. Try to eat at least 2 vegetarian meals a week. Eat more home-cooked food and less restaurant, buffet, and fast food. Lifestyle When eating at a restaurant, ask if your food can be made with less salt or no salt. If you drink alcohol: Limit how much you have to: 0-1 drink a day if you are female. 0-2 drinks a day if you are female. Know how much alcohol is in  your drink. In the [place] one drink is one 12 oz bottle of beer (355 mL), one 5 oz glass of wine (148 mL), or one 1 oz glass of hard liquor (44 mL). General information Avoid eating more than 2,300 mg of salt a day. If you have hypertension, you may need to reduce your sodium intake to 1,500 mg a day. Work with your provider to stay at a healthy body weight or lose weight. Ask what the best weight range is for you. On most days of the week, get at least 30 minutes of exercise that causes your heart to beat faster. This may include walking, swimming, or biking. Work with your provider or dietitian to adjust your eating plan to meet your specific calorie needs. What foods should I eat? Fruits All fresh, dried, or frozen fruit. Canned fruits that are in their natural juice and do not have sugar added to them. Vegetables Fresh or frozen vegetables that are raw, steamed, roasted, or grilled. Low-sodium or reduced-sodium tomato and vegetable juice. Low-sodium or reduced-sodium tomato sauce and tomato paste. Low-sodium or reduced-sodium canned vegetables. Grains Whole-grain or whole-wheat bread. Whole-grain or whole-wheat pasta. Brown rice. [place]neil Madeira. Bulgur. Whole-grain and low-sodium cereals. Pita bread. Low-fat, low-sodium crackers. Whole-wheat flour tortillas. Meats and other proteins Skinless chicken or [place]. Ground chicken or [place]. Pork with fat trimmed off. Fish and seafood. Egg whites. Dried beans, peas, or lentils. Unsalted nuts, nut butters, and seeds. Unsalted canned beans. Lean cuts of beef with fat trimmed off. Low-sodium, lean precooked or cured meat, such as sausages or meat loaves. Dairy Low-fat (1%) or fat-free (skim) milk. Reduced-fat, low-fat, or fat-free cheeses. Nonfat, low-sodium ricotta or cottage cheese. Low-fat or nonfat yogurt. Low-fat, low-sodium cheese. Fats and oils Soft margarine without trans fats. Vegetable oil. Reduced-fat, low-fat, or light mayonnaise and salad  dressings (reduced-sodium). Canola, safflower, olive, avocado, soybean, and sunflower oils. Avocado. Seasonings and condiments Herbs. Spices. Seasoning mixes without salt. Other foods Unsalted popcorn and pretzels. Fat-free sweets. The items listed above may not be all the foods and drinks you can have. Talk to a dietitian to learn more. What foods should I avoid? Fruits Canned fruit in a light or heavy syrup. Fried fruit. Fruit in cream or butter sauce. Vegetables Creamed or fried vegetables. Vegetables in a cheese sauce. Regular canned vegetables that are not marked as low-sodium or reduced-sodium. Regular canned tomato sauce and paste that are not marked as low-sodium or reduced-sodium. Regular tomato and vegetable juices that are not marked as low-sodium or reduced-sodium. Dene. Olives. Grains Baked goods made with fat, such as croissants, muffins, or some breads. Dry pasta or rice meal packs. Meats and other proteins Fatty cuts of meat. Ribs. Fried meat. [place]. Bologna, salami, and other precooked or cured meats, such as sausages or meat loaves, that are not lean and low in sodium. Fat from the back of a pig (fatback). Bratwurst. Salted nuts and seeds. Canned beans with added salt. Canned  or smoked fish. Whole eggs or egg yolks. Chicken or [place] with skin. Dairy Whole or 2% milk, cream, and half-and-half. Whole or full-fat cream cheese. Whole-fat or sweetened yogurt. Full-fat cheese. Nondairy creamers. Whipped toppings. Processed cheese and cheese spreads. Fats and oils Butter. Stick margarine. Lard. Shortening. Ghee. Bacon fat. Tropical oils, such as coconut, palm kernel, or palm oil. Seasonings and condiments Onion salt, garlic salt, seasoned salt, table salt, and sea salt. Worcestershire sauce. Tartar sauce. Barbecue sauce. Teriyaki sauce. Soy sauce, including reduced-sodium soy sauce. Steak sauce. Canned and packaged gravies. Fish sauce. Oyster sauce. Cocktail sauce. Store-bought  horseradish. Ketchup. Mustard. Meat flavorings and tenderizers. Bouillon cubes. Hot sauces. Pre-made or packaged marinades. Pre-made or packaged taco seasonings. Relishes. Regular salad dressings. Other foods Salted popcorn and pretzels. The items listed above may not be all the foods and drinks you should avoid. Talk to a dietitian to learn more. Where to find more information National Heart, Lung, and Blood Institute (NHLBI): BuffaloDryCleaner.gl American Heart Association (AHA): heart.org Academy of Nutrition and Dietetics: eatright.org National Kidney Foundation (NKF): kidney.org This information is not intended to replace advice given to you by your health care provider. Make sure you discuss any questions you have with your health care provider. Document Revised: 03/19/2022 Document Reviewed: 03/19/2022 Elsevier Patient Education  2024 [employer].

## 2024-02-14 ENCOUNTER — Ambulatory Visit (INDEPENDENT_AMBULATORY_CARE_PROVIDER_SITE_OTHER): Admitting: Nurse Practitioner

## 2024-02-14 ENCOUNTER — Encounter: Payer: Self-pay | Admitting: Nurse Practitioner

## 2024-02-14 VITALS — BP 134/78 | HR 79 | Temp 97.6°F | Resp 14 | Ht 60.98 in | Wt 110.2 lb

## 2024-02-14 DIAGNOSIS — I10 Essential (primary) hypertension: Secondary | ICD-10-CM

## 2024-02-14 DIAGNOSIS — G894 Chronic pain syndrome: Secondary | ICD-10-CM

## 2024-02-14 DIAGNOSIS — G4709 Other insomnia: Secondary | ICD-10-CM | POA: Diagnosis not present

## 2024-02-14 DIAGNOSIS — E782 Mixed hyperlipidemia: Secondary | ICD-10-CM

## 2024-02-14 DIAGNOSIS — M8588 Other specified disorders of bone density and structure, other site: Secondary | ICD-10-CM

## 2024-02-14 MED ORDER — TRAZODONE HCL 100 MG PO TABS: 100.0000 mg | ORAL_TABLET | Freq: Every evening | ORAL | 4 refills | Status: AC | PRN

## 2024-02-14 NOTE — Assessment & Plan Note (Addendum)
 Chronic.  DEXA 08/06/23 -- osteopenia with T-score -2.4, previous was -2.2.  Continue daily Vitamin D . Check level at physical. Repeat DEXA in 2027.

## 2024-02-14 NOTE — Assessment & Plan Note (Signed)
 Chronic, stable.  BP close to goal on recheck today.  Continue current medication regimen and adjust as needed, if remains a bit above goal next visit may need to increase Amlodipine  to 5 MG.  Refills sent.  Recommend focus on DASH diet at home and continue to monitor BP regularly.  LABS: CMP.  She is very active at baseline.

## 2024-02-14 NOTE — Assessment & Plan Note (Signed)
 Chronic.  Continue Trazodone  as needed only.  She is aware not to take Lyrica  and Trazodone  at the same time due to possible increase in fatigue with this. Refills sent.

## 2024-02-14 NOTE — Assessment & Plan Note (Signed)
Chronic, ongoing, taking Crestor daily and tolerating.  Continue this medication regimen and adjust as needed.  Lipid panel and CMP today. 

## 2024-02-14 NOTE — Progress Notes (Signed)
 BP 134/78 (BP Location: Left Arm, Patient Position: Sitting, Cuff Size: Normal)   Pulse 79   Temp 97.6 F (36.4 C) (Oral)   Resp 14   Ht 5' 0.98 (1.549 m)   Wt 110 lb 3.2 oz (50 kg)   SpO2 99%   BMI 20.83 kg/m    Subjective:    Patient ID: Danielle Stephens Free, female    DOB: [birth year], 73 y.o.   MRN: [id number]  HPI: CINTHYA BORS is a 73 y.o. female  Chief Complaint  Patient presents with   HTN/HLD    [place]. Will resume very soon.    OSTEOPENIA   HYPERTENSION & HLD Continues Lisinopril , Amlodipine , and Rosuvastatin .   Hypertension status: stable  Satisfied with current treatment? yes Duration of hypertension: chronic BP monitoring frequency: not checking BP range:  BP medication side effects:  no Medication compliance: good compliance Previous BP meds: Lisinopril , Amlodipine  Aspirin : no Recurrent headaches: no Visual changes: no Palpitations: no Dyspnea: no Chest pain: no Lower extremity edema: no Dizzy/lightheaded: no  The 10-year ASCVD risk score (Arnett DK, et al., 2019) is: 13.8%   Values used to calculate the score:     Age: 30 years     Clincally relevant sex: Female     Is Non-Hispanic African American: No     Diabetic: No     Tobacco smoker: No     Systolic Blood Pressure: 134 mmHg     Is BP treated: Yes     HDL Cholesterol: 72 mg/dL     Total Cholesterol: 159 mg/dL  OSTEOPENIA DEXA on 4/76/74 continues to note osteopenia.   Satisfied with current treatment?: yes Adequate calcium  & vitamin D : yes Weight bearing exercises: no   INSOMNIA & CHRONIC PAIN Continues Lyrica  and Robaxin . Saw pain clinic last on 10/21/2023. Continues to have difficulty with sleeping due to pain.  Trazodone  helps. Duration: chronic Satisfied with sleep quality: no Difficulty falling asleep: yes Difficulty staying asleep: yes Waking a few hours after sleep onset: yes Early morning awakenings: no Daytime hypersomnolence: no Wakes  feeling refreshed: no Good sleep hygiene: yes Apnea: no Snoring: no Depressed/anxious mood: no Recent stress: no Restless legs/nocturnal leg cramps: no Chronic pain/arthritis: yes History of sleep study: no Treatments attempted: Melatonin, Zquil     02/14/2024   10:18 AM 12/23/2023    8:32 AM 12/14/2023    2:34 PM 10/21/2023   11:25 AM 09/01/2023   10:32 AM  Depression screen PHQ 2/9  Decreased Interest  0 0 0 0  Down, Depressed, Hopeless 0 0 0 0 0  PHQ - 2 Score 0 0 0 0 0  Altered sleeping 0  0    Tired, decreased energy 0  0    Change in appetite 0  0    Feeling bad or failure about yourself  0  0    Trouble concentrating 0  0    Moving slowly or fidgety/restless 0  0    Suicidal thoughts 0  0    PHQ-9 Score 0  0     Difficult doing work/chores   Not difficult at all       Data saved with a previous flowsheet row definition       02/14/2024   10:18 AM 08/12/2023   10:10 AM 06/21/2023    1:41 PM 05/21/2023    9:54 AM  GAD 7 : Generalized Anxiety Score  Nervous, Anxious, on Edge  0 0 0 0  Control/stop worrying 0 0 0 0  Worry too much - different things 0 0 0 0  Trouble relaxing 0 0 0 0  Restless 0 0 0 0  Easily annoyed or irritable 0 0 0 0  Afraid - awful might happen 0 0 0 0  Total GAD 7 Score 0 0 0 0  Anxiety Difficulty  Not difficult at all Not difficult at all Not difficult at all   Relevant past medical, surgical, family and social history reviewed and updated as indicated. Interim medical history since our last visit reviewed. Allergies and medications reviewed and updated.  Review of Systems  Constitutional:  Negative for activity change, appetite change, diaphoresis, fatigue and fever.  Respiratory:  Negative for cough, chest tightness, shortness of breath and wheezing.   Cardiovascular:  Negative for chest pain, palpitations and leg swelling.  Gastrointestinal: Negative.   Musculoskeletal:  Positive for neck pain.  Neurological: Negative.    Psychiatric/Behavioral: Negative.     Per HPI unless specifically indicated above     Objective:    BP 134/78 (BP Location: Left Arm, Patient Position: Sitting, Cuff Size: Normal)   Pulse 79   Temp 97.6 F (36.4 C) (Oral)   Resp 14   Ht 5' 0.98 (1.549 m)   Wt 110 lb 3.2 oz (50 kg)   SpO2 99%   BMI 20.83 kg/m   Wt Readings from Last 3 Encounters:  02/14/24 110 lb 3.2 oz (50 kg)  12/24/23 111 lb (50.3 kg)  12/23/23 109 lb (49.4 kg)    Physical Exam Vitals and nursing note reviewed.  Constitutional:      General: She is awake. She is not in acute distress.    Appearance: She is well-developed and well-groomed. She is not ill-appearing or toxic-appearing.  HENT:     Head: Normocephalic.     Right Ear: Hearing and external ear normal.     Left Ear: Hearing and external ear normal.  Eyes:     General: Lids are normal.        Right eye: No discharge.        Left eye: No discharge.     Conjunctiva/sclera: Conjunctivae normal.     Pupils: Pupils are equal, round, and reactive to light.  Neck:     Thyroid : No thyromegaly.     Vascular: No carotid bruit.  Cardiovascular:     Rate and Rhythm: Normal rate and regular rhythm.     Heart sounds: Normal heart sounds. No murmur heard.    No gallop.  Pulmonary:     Effort: Pulmonary effort is normal. No accessory muscle usage or respiratory distress.     Breath sounds: Normal breath sounds.  Abdominal:     General: Bowel sounds are normal. There is no distension.     Palpations: Abdomen is soft.     Tenderness: There is no abdominal tenderness.  Musculoskeletal:     Cervical back: Normal range of motion and neck supple.     Right lower leg: No edema.     Left lower leg: No edema.  Lymphadenopathy:     Cervical: No cervical adenopathy.  Skin:    General: Skin is warm and dry.  Neurological:     Mental Status: She is alert and oriented to person, place, and time.     Deep Tendon Reflexes: Reflexes are normal and symmetric.      Reflex Scores:      Brachioradialis reflexes are 2+  on the right side and 2+ on the left side.      Patellar reflexes are 2+ on the right side and 2+ on the left side. Psychiatric:        Attention and Perception: Attention normal.        Mood and Affect: Mood normal.        Speech: Speech normal.        Behavior: Behavior normal. Behavior is cooperative.        Thought Content: Thought content normal.    Results for orders placed or performed in visit on 08/12/23  Microalbumin, Urine Waived   Collection Time: 08/12/23 10:50 AM  Result Value Ref Range   Microalb, Ur Waived 30 (H) 0 - 19 mg/L   Creatinine, Urine Waived 50 10 - 300 mg/dL   Microalb/Creat Ratio 30-300 (H) <30 mg/g  Comprehensive metabolic panel with GFR   Collection Time: 08/12/23 10:51 AM  Result Value Ref Range   Glucose 92 70 - 99 mg/dL   BUN 13 8 - 27 mg/dL   Creatinine, Ser 9.07 0.57 - 1.00 mg/dL   eGFR 67 >40 fO/fpw/8.26   BUN/Creatinine Ratio 14 12 - 28   Sodium 138 134 - 144 mmol/L   Potassium 4.7 3.5 - 5.2 mmol/L   Chloride 99 96 - 106 mmol/L   CO2 26 20 - 29 mmol/L   Calcium  9.2 8.7 - 10.3 mg/dL   Total Protein 7.0 6.0 - 8.5 g/dL   Albumin 4.5 3.9 - 4.9 g/dL   Globulin, Total 2.5 1.5 - 4.5 g/dL   Bilirubin Total 0.4 0.0 - 1.2 mg/dL   Alkaline Phosphatase 66 44 - 121 IU/L   AST 28 0 - 40 IU/L   ALT 16 0 - 32 IU/L  CBC with Differential/Platelet   Collection Time: 08/12/23 10:51 AM  Result Value Ref Range   WBC 4.2 3.4 - 10.8 x10E3/uL   RBC 4.55 3.77 - 5.28 x10E6/uL   Hemoglobin 12.5 11.1 - 15.9 g/dL   Hematocrit 60.3 65.9 - 46.6 %   MCV 87 79 - 97 fL   MCH 27.5 26.6 - 33.0 pg   MCHC 31.6 31.5 - 35.7 g/dL   RDW 86.9 88.2 - 84.5 %   Platelets 199 150 - 450 x10E3/uL   Neutrophils 63 Not Estab. %   Lymphs 23 Not Estab. %   Monocytes 10 Not Estab. %   Eos 3 Not Estab. %   Basos 1 Not Estab. %   Neutrophils Absolute 2.7 1.4 - 7.0 x10E3/uL   Lymphocytes Absolute 1.0 0.7 - 3.1 x10E3/uL    Monocytes Absolute 0.4 0.1 - 0.9 x10E3/uL   EOS (ABSOLUTE) 0.1 0.0 - 0.4 x10E3/uL   Basophils Absolute 0.0 0.0 - 0.2 x10E3/uL   Immature Granulocytes 0 Not Estab. %   Immature Grans (Abs) 0.0 0.0 - 0.1 x10E3/uL  Lipid Panel w/o Chol/HDL Ratio   Collection Time: 08/12/23 10:51 AM  Result Value Ref Range   Cholesterol, Total 159 100 - 199 mg/dL   Triglycerides 81 0 - 149 mg/dL   HDL 72 >60 mg/dL   VLDL Cholesterol Cal 15 5 - 40 mg/dL   LDL Chol Calc (NIH) 72 0 - 99 mg/dL  TSH   Collection Time: 08/12/23 10:51 AM  Result Value Ref Range   TSH 1.760 0.450 - 4.500 uIU/mL  VITAMIN D  25 Hydroxy (Vit-D Deficiency, Fractures)   Collection Time: 08/12/23 10:51 AM  Result Value Ref Range   Vit D, 25-Hydroxy  43.5 30.0 - 100.0 ng/mL      Assessment & Plan:   Problem List Items Addressed This Visit       Cardiovascular and Mediastinum   Benign essential HTN - Primary   Chronic, stable.  BP close to goal on recheck today.  Continue current medication regimen and adjust as needed, if remains a bit above goal next visit may need to increase Amlodipine  to 5 MG.  Refills sent.  Recommend focus on DASH diet at home and continue to monitor BP regularly.  LABS: CMP.  She is very active at baseline.      Relevant Orders   Comprehensive metabolic panel with GFR     Musculoskeletal and Integument   Osteopenia   Chronic.  DEXA 08/06/23 -- osteopenia with T-score -2.4, previous was -2.2.  Continue daily Vitamin D . Check level at physical. Repeat DEXA in 2027.        Other   Insomnia   Chronic.  Continue Trazodone  as needed only.  She is aware not to take Lyrica  and Trazodone  at the same time due to possible increase in fatigue with this. Refills sent.      Hyperlipidemia, mixed   Chronic, ongoing, taking Crestor  daily and tolerating.  Continue this medication regimen and adjust as needed.  Lipid panel and CMP today.      Relevant Orders   Comprehensive metabolic panel with GFR   Lipid Panel  w/o Chol/HDL Ratio   Chronic pain syndrome   Chronic, ongoing.  Continue to collaborate with Dr. Marcelino at pain clinic, appreciate their input.  Continue Lyrica  at current dosing and adjust as needed.      Relevant Medications   traZODone  (DESYREL ) 100 MG tablet     Follow up plan: Return in about 6 months (around 08/14/2024) for Annual Physical -- after 08/11/24.

## 2024-02-14 NOTE — Assessment & Plan Note (Signed)
 Chronic, ongoing.  Continue to collaborate with Dr. Marcelino at pain clinic, appreciate their input.  Continue Lyrica  at current dosing and adjust as needed.

## 2024-02-15 ENCOUNTER — Ambulatory Visit: Payer: Self-pay | Admitting: Nurse Practitioner

## 2024-02-15 LAB — COMPREHENSIVE METABOLIC PANEL WITH GFR
ALT: 15 IU/L (ref 0–32)
AST: 28 IU/L (ref 0–40)
Albumin: 4.6 g/dL (ref 3.8–4.8)
Alkaline Phosphatase: 62 IU/L (ref 49–135)
BUN/Creatinine Ratio: 16 (ref 12–28)
BUN: 13 mg/dL (ref 8–27)
Bilirubin Total: 0.3 mg/dL (ref 0.0–1.2)
CO2: 27 mmol/L (ref 20–29)
Calcium: 9.7 mg/dL (ref 8.7–10.3)
Chloride: 101 mmol/L (ref 96–106)
Creatinine, Ser: 0.8 mg/dL (ref 0.57–1.00)
Globulin, Total: 2.2 g/dL (ref 1.5–4.5)
Glucose: 85 mg/dL (ref 70–99)
Potassium: 4.6 mmol/L (ref 3.5–5.2)
Sodium: 141 mmol/L (ref 134–144)
Total Protein: 6.8 g/dL (ref 6.0–8.5)
eGFR: 79 mL/min/1.73 (ref >59)

## 2024-02-15 LAB — LIPID PANEL W/O CHOL/HDL RATIO
Cholesterol, Total: 161 mg/dL (ref 100–199)
HDL: 65 mg/dL (ref >39)
LDL Chol Calc (NIH): 77 mg/dL (ref 0–99)
Triglycerides: 107 mg/dL (ref 0–149)
VLDL Cholesterol Cal: 19 mg/dL (ref 5–40)

## 2024-02-15 NOTE — Progress Notes (Signed)
 Contacted via MyChart  Good morning Danielle Stephens, your labs have returned and remain stable. No changes needed. Great job!!  Any questions? Keep being amazing!!  Thank you for allowing me to participate in your care.  I appreciate you. Kindest regards, Aadvika Konen

## 2024-03-14 ENCOUNTER — Other Ambulatory Visit: Payer: Self-pay | Admitting: Student in an Organized Health Care Education/Training Program

## 2024-03-14 DIAGNOSIS — M12811 Other specific arthropathies, not elsewhere classified, right shoulder: Secondary | ICD-10-CM

## 2024-03-14 DIAGNOSIS — G8929 Other chronic pain: Secondary | ICD-10-CM

## 2024-03-14 DIAGNOSIS — Z96611 Presence of right artificial shoulder joint: Secondary | ICD-10-CM

## 2024-03-31 ENCOUNTER — Other Ambulatory Visit: Payer: Self-pay | Admitting: *Deleted

## 2024-03-31 DIAGNOSIS — G8929 Other chronic pain: Secondary | ICD-10-CM

## 2024-03-31 DIAGNOSIS — Z96611 Presence of right artificial shoulder joint: Secondary | ICD-10-CM

## 2024-03-31 DIAGNOSIS — M75101 Unspecified rotator cuff tear or rupture of right shoulder, not specified as traumatic: Secondary | ICD-10-CM

## 2024-03-31 NOTE — Progress Notes (Unsigned)
 Safety precautions to be maintained throughout the outpatient stay will include: orient to surroundings, keep bed in low position, maintain call bell within reach at all times, provide assistance with transfer out of bed and ambulation.

## 2024-04-20 ENCOUNTER — Ambulatory Visit: Admitting: Student in an Organized Health Care Education/Training Program

## 2024-08-14 ENCOUNTER — Encounter: Admitting: Nurse Practitioner

## 2024-12-19 ENCOUNTER — Ambulatory Visit
